# Patient Record
Sex: Male | Born: 1950
Health system: Southern US, Community
[De-identification: ages and names within clinical notes are randomized; demographics above are authoritative.]

## PROBLEM LIST (undated history)

## (undated) DIAGNOSIS — I509 Heart failure, unspecified: Secondary | ICD-10-CM

## (undated) DIAGNOSIS — I10 Essential (primary) hypertension: Secondary | ICD-10-CM

## (undated) DIAGNOSIS — Z973 Presence of spectacles and contact lenses: Secondary | ICD-10-CM

## (undated) DIAGNOSIS — J449 Chronic obstructive pulmonary disease, unspecified: Secondary | ICD-10-CM

## (undated) DIAGNOSIS — K219 Gastro-esophageal reflux disease without esophagitis: Secondary | ICD-10-CM

## (undated) DIAGNOSIS — C801 Malignant (primary) neoplasm, unspecified: Secondary | ICD-10-CM

## (undated) DIAGNOSIS — G4730 Sleep apnea, unspecified: Secondary | ICD-10-CM

## (undated) DIAGNOSIS — E079 Disorder of thyroid, unspecified: Secondary | ICD-10-CM

## (undated) DIAGNOSIS — F419 Anxiety disorder, unspecified: Secondary | ICD-10-CM

## (undated) DIAGNOSIS — M1990 Unspecified osteoarthritis, unspecified site: Secondary | ICD-10-CM

## (undated) DIAGNOSIS — I350 Nonrheumatic aortic (valve) stenosis: Secondary | ICD-10-CM

## (undated) DIAGNOSIS — Z923 Personal history of irradiation: Secondary | ICD-10-CM

## (undated) DIAGNOSIS — R011 Cardiac murmur, unspecified: Secondary | ICD-10-CM

## (undated) HISTORY — PX: HERNIA REPAIR: SHX51

## (undated) HISTORY — DX: Disorder of thyroid, unspecified: E07.9

## (undated) HISTORY — DX: Gastro-esophageal reflux disease without esophagitis: K21.9

## (undated) HISTORY — DX: Heart failure, unspecified: I50.9

## (undated) HISTORY — DX: Malignant (primary) neoplasm, unspecified: C80.1

## (undated) HISTORY — DX: Chronic obstructive pulmonary disease, unspecified: J44.9

## (undated) HISTORY — DX: Cardiac murmur, unspecified: R01.1

## (undated) HISTORY — DX: Nonrheumatic aortic (valve) stenosis: I35.0

## (undated) HISTORY — DX: Essential (primary) hypertension: I10

## (undated) HISTORY — DX: Anxiety disorder, unspecified: F41.9

---

## 2004-01-05 ENCOUNTER — Emergency Department (HOSPITAL_COMMUNITY): Admission: EM | Admit: 2004-01-05 | Discharge: 2004-01-05 | Payer: Self-pay | Admitting: Emergency Medicine

## 2005-08-20 ENCOUNTER — Ambulatory Visit (HOSPITAL_COMMUNITY): Admission: RE | Admit: 2005-08-20 | Discharge: 2005-08-20 | Payer: Self-pay | Admitting: Family Medicine

## 2005-10-23 ENCOUNTER — Ambulatory Visit (HOSPITAL_COMMUNITY): Admission: RE | Admit: 2005-10-23 | Discharge: 2005-10-23 | Payer: Self-pay | Admitting: Family Medicine

## 2006-02-24 HISTORY — PX: OTHER SURGICAL HISTORY: SHX169

## 2006-04-23 ENCOUNTER — Ambulatory Visit: Payer: Self-pay | Admitting: Gastroenterology

## 2006-04-23 LAB — CONVERTED CEMR LAB
Basophils Absolute: 0 10*3/uL (ref 0.0–0.1)
Basophils Relative: 0.8 % (ref 0.0–1.0)
Eosinophils Absolute: 0.4 10*3/uL (ref 0.0–0.6)
Eosinophils Relative: 6 % — ABNORMAL HIGH (ref 0.0–5.0)
Ferritin: 92.4 ng/mL (ref 22.0–322.0)
HCT: 42.9 % (ref 39.0–52.0)
Hemoglobin: 14.6 g/dL (ref 13.0–17.0)
Iron: 93 ug/dL (ref 42–165)
Lymphocytes Relative: 28.8 % (ref 12.0–46.0)
MCHC: 34 g/dL (ref 30.0–36.0)
MCV: 93.5 fL (ref 78.0–100.0)
Monocytes Absolute: 0.5 10*3/uL (ref 0.2–0.7)
Monocytes Relative: 7.9 % (ref 3.0–11.0)
Neutro Abs: 3.5 10*3/uL (ref 1.4–7.7)
Neutrophils Relative %: 56.5 % (ref 43.0–77.0)
Platelets: 242 10*3/uL (ref 150–400)
RBC: 4.58 M/uL (ref 4.22–5.81)
RDW: 11.8 % (ref 11.5–14.6)
Saturation Ratios: 31.3 % (ref 20.0–50.0)
Transferrin: 211.9 mg/dL — ABNORMAL LOW (ref >=212.0)
WBC: 6.2 10*3/uL (ref 4.5–10.5)

## 2006-08-10 ENCOUNTER — Ambulatory Visit: Payer: Self-pay | Admitting: Gastroenterology

## 2006-08-24 ENCOUNTER — Encounter: Payer: Self-pay | Admitting: Gastroenterology

## 2006-08-24 ENCOUNTER — Ambulatory Visit: Payer: Self-pay | Admitting: Gastroenterology

## 2010-07-12 NOTE — Assessment & Plan Note (Signed)
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   This past August Mr. Dais was having recurrent episodes of suprapubic  pain and was found by CT scan to have diverticulitis, and was treated  with appropriate antibiotics.  He had followup CT scan on October 23, 2005, which showed resolution of his previous diverticulitis, and he did  have extensive diverticulosis.  He has done well, except for the last 3  to 4 months, he has had a spasmodic-type pain in his rectum, made worse  by activity.  He has had some mild constipation and straining.  Has not  really seen a bright red blood per rectum.  He has had no fever, chills,  anorexia, weight loss, or any upper GI hepatobiliary problems.  He has  had no major change in his diet and has kept his weight up well.  This  pain does not awaken him from sleep.  He denies any specific food  intolerances.   He was recently seen in [facility] and had a  normal metabolic profile and liver function tests, and PSA.  I do not  see a CBC from that visit.   PAST MEDICAL HISTORY:  Remarkable for repair of an inguinal hernia many  years ago with removal of his left testicle.  He has otherwise had no  abdominal problems or other medical difficulties.   MEDICATIONS:  None.   ALLERGIES:  None.   FAMILY HISTORY:  Entirely noncontributory.   SOCIAL HISTORY:  He is married and lives with his wife, and has a  [employer].  The patient does smoke and uses ethanol socially.   REVIEW OF SYSTEMS:  Noncontributory.  Without any cardiovascular,  pulmonary, genitourinary, neurological, orthopedic, or endocrine  problems.   EXAM:  Shows him to be a healthy-appearing white male appearing his  stated age in no acute distress.  He is 5 feet 9 inches tall and weighs 193 pounds.  Blood pressure is  110/74, and pulse was 60 and regular.  I could not appreciate stigmata of chronic liver disease.  CHEST:  Entirely clear.  He appeared to be in a regular rhythm without murmurs, gallops, or rubs.  I could not appreciate hepatosplenomegaly, abdominal masses, significant  tenderness, or any evidence of inguinal hernias.  Bowel sounds were  normal.  Upper extremities were unremarkable.  Mental status was clear.  Inspection of the rectum showed no fissures, fistulae, or obvious  hemorrhoids.  Rectal exam showed no masses or tenderness.  There was  formed stool present that was trace guaiac positive.   Mr. Gamero symptoms are somewhat atypical and I am slightly concerned  about his trace positive guaiac stool, which may be traumatic.  It  certainly sounds like he is having rectal spasm, etiology unclear,  perhaps proctosigmoiditis.  As mentioned above, he has had extensive  diverticulosis with a history of diverticulitis.   RECOMMENDATIONS:  1. Continue high fiber diet with liberal p.o. fluids as tolerated.  2. Check CBC and iron studies.  3. Outpatient colonoscopy exam.  4. Trial of Canasa 1 g suppositories at bedtime.  5. Other medications as per Dr. Christell Constant.     Vania Rea. Jarold Motto, MD, Caleen Essex, FAGA  Electronically Signed    DRP/MedQ  DD: 04/23/2006  DT: 04/23/2006  Job #: [id number]   cc:   Ernestina Penna, M.D.

## 2011-06-17 ENCOUNTER — Encounter: Payer: Self-pay | Admitting: Gastroenterology

## 2013-01-11 ENCOUNTER — Ambulatory Visit (INDEPENDENT_AMBULATORY_CARE_PROVIDER_SITE_OTHER): Payer: 59 | Admitting: Family Medicine

## 2013-01-11 ENCOUNTER — Encounter (INDEPENDENT_AMBULATORY_CARE_PROVIDER_SITE_OTHER): Payer: Self-pay

## 2013-01-11 ENCOUNTER — Encounter: Payer: Self-pay | Admitting: Family Medicine

## 2013-01-11 VITALS — BP 148/96 | HR 70 | Temp 97.2°F | Ht 67.5 in | Wt 197.0 lb

## 2013-01-11 DIAGNOSIS — H60392 Other infective otitis externa, left ear: Secondary | ICD-10-CM

## 2013-01-11 DIAGNOSIS — H6120 Impacted cerumen, unspecified ear: Secondary | ICD-10-CM

## 2013-01-11 DIAGNOSIS — H60399 Other infective otitis externa, unspecified ear: Secondary | ICD-10-CM

## 2013-01-11 DIAGNOSIS — H6122 Impacted cerumen, left ear: Secondary | ICD-10-CM

## 2013-01-11 MED ORDER — AMOXICILLIN 875 MG PO TABS: 875.0000 mg | ORAL_TABLET | Freq: Two times a day (BID) | ORAL | Status: DC

## 2013-01-11 MED ORDER — NEOMYCIN-COLIST-HC-THONZONIUM 3.3-3-10-0.5 MG/ML OT SUSP: 4.0000 [drp] | Freq: Four times a day (QID) | OTIC | Status: DC

## 2013-01-11 NOTE — Progress Notes (Signed)
  Subjective:    Patient ID: Terry Pacheco, male    DOB: [birth year], 62 y.o.   MRN: [id number]  HPI Pt here with complaints of left ear pain. Pt states that he "always gets stopped up" and has a lot of pressure. Pt states he attempted to clean his ear out, but it seems to have made the pressure worse.   Review of Systems  Constitutional: Negative.   HENT: Positive for ear pain.   Respiratory: Negative.   Gastrointestinal: Negative.   Musculoskeletal: Negative.   All other systems reviewed and are negative.       Objective:   Physical Exam  Constitutional: He is oriented to person, place, and time. He appears well-developed and well-nourished.  HENT:  Right Ear: External ear normal.  Mouth/Throat: Oropharynx is clear and moist.  Left ear packed with cerumen. Unable to visualize TM   Eyes: Pupils are equal, round, and reactive to light.  Pulmonary/Chest: Breath sounds normal. No respiratory distress. He has no wheezes.  Neurological: He is alert and oriented to person, place, and time.  Skin: Skin is warm and dry.  Psychiatric: He has a normal mood and affect. His behavior is normal. Judgment and thought content normal.     BP 148/96  Pulse 70  Temp(Src) 97.2 F (36.2 C) (Oral)  Ht 5' 7.5" (1.715 m)  Wt 197 lb (89.359 kg)  BMI 30.38 kg/m2      Assessment & Plan:  Cerumen impaction, left - Plan: amoxicillin (AMOXIL) 875 MG tablet, neomycin-colistin-hydrocortisone-thonzonium (CORTISPORIN-TC) 3.04-26-08-0.5 MG/ML otic suspension  Otitis, externa, infective, left - Plan: amoxicillin (AMOXIL) 875 MG tablet, neomycin-colistin-hydrocortisone-thonzonium (CORTISPORIN-TC) 3.04-26-08-0.5 MG/ML otic suspension  Deatra Canter FNP

## 2013-01-11 NOTE — Patient Instructions (Signed)
Cerumen Plug A cerumen plug is having too much wax in your ear canal. The outer ear canal is lined with hairs and glands that secrete wax. This wax is called cerumen. This protects the ear canal. It also helps prevent material from entering the ear. Too much wax can cause a feeling of fullness in the ears, decreased hearing, ringing in the ears, or an earache. Sometimes your caregiver will remove a cerumen plug with an instrument called a curette. Or he/she may flush the ear canal with warm water from a syringe to remove the wax. You may simply be sent home to follow the home care instructions below for wax removal. Generally ear wax does not have to be removed unless it is causing a problem such as one of those listed above. When too much wax is causing a problem, the following are a few home remedies which can be used to help this problem. HOME CARE INSTRUCTIONS   Put a couple drops of glycerin, baby oil, or mineral oil in the ear a couple times of day. Do this every day for several days. After putting the drops in, you will need to lay with the affected ear pointing up for a couple minutes. This allows the drops to remain in the canal and run down to the area of wax blockage. This will soften the wax plug. It may also make your hearing worse as the wax softens and blocks the canal even more.  After a couple days, you may gently flush the ear canal with warm water from a syringe. Do this by pulling your ear up and back with your head tilted slightly forward and towards a pan to catch the water. This is most easily done with a helper. You can also accomplish the same thing by letting the shower beat into your ear canal to wash the wax out. Sometimes this will not be immediately successful. You will have to return to the first step of using the oil to further soften the wax. Then resume washing the ear canal out with a syringe or shower.  Following removal of the wax, put ten to twenty drops of rubbing  alcohol into the outer ears. This will dry the canal and prevent an infection.  Do not irrigate or wash out your ears if you have had a perforated ear drum or mastoid surgery. SEEK IMMEDIATE MEDICAL CARE IF:   You are unsuccessful with the above instructions for home care.  You develop ear pain or drainage from the ear. MAKE SURE YOU:   Understand these instructions.  Will watch your condition.  Will get help right away if you are not doing well or get worse. Document Released: 11/05/2000 Document Revised: 05/05/2011 Document Reviewed: 02/02/2008 Exit[employer] Patient Information 2014 [employer].  

## 2013-01-24 ENCOUNTER — Ambulatory Visit (INDEPENDENT_AMBULATORY_CARE_PROVIDER_SITE_OTHER): Payer: 59 | Admitting: Nurse Practitioner

## 2013-01-24 VITALS — BP 144/94 | HR 65 | Temp 97.6°F | Ht 69.0 in | Wt 202.0 lb

## 2013-01-24 DIAGNOSIS — H6592 Unspecified nonsuppurative otitis media, left ear: Secondary | ICD-10-CM

## 2013-01-24 DIAGNOSIS — M26629 Arthralgia of temporomandibular joint, unspecified side: Secondary | ICD-10-CM

## 2013-01-24 DIAGNOSIS — H6590 Unspecified nonsuppurative otitis media, unspecified ear: Secondary | ICD-10-CM

## 2013-01-24 MED ORDER — CHLORPHEN-PE-ACETAMINOPHEN 4-10-325 MG PO TABS: 1.0000 | ORAL_TABLET | Freq: Four times a day (QID) | ORAL | Status: DC | PRN

## 2013-01-24 MED ORDER — FLUTICASONE PROPIONATE 50 MCG/ACT NA SUSP: 2.0000 | Freq: Every day | NASAL | Status: DC

## 2013-01-24 NOTE — Progress Notes (Signed)
   Subjective:    Patient ID: Terry Pacheco, male    DOB: September 13, 1950, 62 y.o.   MRN: [id number]  HPI  Patient in today c/o left ear pain- was seen then and he had cerumen impaction and otitis media- impaction was irrigated out and he was given amoxicillin 875 bid and ear drops. Still has lots of pressure in ear and it is driving him crazy!!!    Review of Systems  Constitutional: Negative for fever and chills.  HENT: Positive for ear pain. Negative for congestion, ear discharge and facial swelling.   Respiratory: Negative for cough.   Cardiovascular: Negative.   All other systems reviewed and are negative.       Objective:   Physical Exam  Constitutional: He is oriented to person, place, and time. He appears well-developed and well-nourished.  HENT:  Right Ear: Hearing, tympanic membrane, external ear and ear canal normal.  Left Ear: Hearing normal. A middle ear effusion (clear) is present.  Nose: Nose normal. No mucosal edema or rhinorrhea.  Mouth/Throat: Oropharynx is clear and moist and mucous membranes are normal.  Left TMJ pops with FROM  Eyes: Conjunctivae and EOM are normal. Pupils are equal, round, and reactive to light.  Neck: Normal range of motion.  Pulmonary/Chest: Effort normal and breath sounds normal.  Abdominal: Soft. Bowel sounds are normal.  Lymphadenopathy:    He has no cervical adenopathy.  Neurological: He is alert and oriented to person, place, and time.    BP 144/94  Pulse 65  Temp(Src) 97.6 F (36.4 C) (Oral)  Ht 5\' 9"  (1.753 m)  Wt 202 lb (91.627 kg)  BMI 29.82 kg/m2       Assessment & Plan:   1. Middle ear effusion, left   2. TMJ arthralgia    Meds ordered this encounter  Medications  . fluticasone (FLONASE) 50 MCG/ACT nasal spray    Sig: Place 2 sprays into both nostrils daily.    Dispense:  16 g    Refill:  6    Order Specific Question:  Supervising Provider    Answer:  Ernestina Penna [[id number]]  . Chlorphen-PE-Acetaminophen (NOREL AD)  4-10-325 MG TABS    Sig: Take 1 tablet by mouth every 6 (six) hours as needed.    Dispense:  10 tablet    Refill:  0    [phone]    Order Specific Question:  Supervising Provider    Answer:  Ernestina Penna [[id number]]  1/2 norel ad due to blood pressure Force fluids  Motrin OTC as needed Mouth guard to use when sleeping RTO prn Blood pressure elevated- low NA diet- keep diary of Bp- Make appointment for physical Mary-Margaret Daphine Deutscher, FNP

## 2013-01-24 NOTE — Patient Instructions (Signed)
Temporomandibular Problems  Temporomandibular joint (TMJ) dysfunction means there are problems with the joint between your jaw and your skull. This is a joint lined by cartilage like other joints in your body but also has a small disc in the joint which keeps the bones from rubbing on each other. These joints are like other joints and can get inflamed (sore) from arthritis and other problems. When this joint gets sore, it can cause headaches and pain in the jaw and the face. CAUSES  Usually the arthritic types of problems are caused by soreness in the joint. Soreness in the joint can also be caused by overuse. This may come from grinding your teeth. It may also come from mis-alignment in the joint. DIAGNOSIS Diagnosis of this condition can often be made by history and exam. Sometimes your caregiver may need X-rays or an MRI scan to determine the exact cause. It may be necessary to see your dentist to determine if your teeth and jaws are lined up correctly. TREATMENT  Most of the time this problem is not serious; however, sometimes it can persist (become chronic). When this happens medications that will cut down on inflammation (soreness) help. Sometimes a shot of cortisone into the joint will be helpful. If your teeth are not aligned it may help for your dentist to make a splint for your mouth that can help this problem. If no physical problems can be found, the problem may come from tension. If tension is found to be the cause, biofeedback or relaxation techniques may be helpful. HOME CARE INSTRUCTIONS   Later in the day, applications of ice packs may be helpful. Ice can be used in a plastic bag with a towel around it to prevent frostbite to skin. This may be used about every 2 hours for 20 to 30 minutes, as needed while awake, or as directed by your caregiver.  Only take over-the-counter or prescription medicines for pain, discomfort, or fever as directed by your caregiver.  If physical therapy was  prescribed, follow your caregiver's directions.  Wear mouth appliances as directed if they were given. Document Released: 11/05/2000 Document Revised: 05/05/2011 Document Reviewed: 02/13/2008 [employer] Patient Information 2014 [employer].  

## 2013-02-08 ENCOUNTER — Encounter: Payer: Self-pay | Admitting: Family Medicine

## 2013-02-08 ENCOUNTER — Ambulatory Visit (INDEPENDENT_AMBULATORY_CARE_PROVIDER_SITE_OTHER): Payer: 59 | Admitting: Family Medicine

## 2013-02-08 VITALS — BP 107/69 | HR 66 | Temp 99.0°F | Ht 69.0 in | Wt 195.0 lb

## 2013-02-08 DIAGNOSIS — H8110 Benign paroxysmal vertigo, unspecified ear: Secondary | ICD-10-CM

## 2013-02-08 DIAGNOSIS — I10 Essential (primary) hypertension: Secondary | ICD-10-CM

## 2013-02-08 DIAGNOSIS — R42 Dizziness and giddiness: Secondary | ICD-10-CM

## 2013-02-08 DIAGNOSIS — R51 Headache: Secondary | ICD-10-CM

## 2013-02-08 LAB — POCT GLYCOSYLATED HEMOGLOBIN (HGB A1C): Hemoglobin A1C: 5.2

## 2013-02-08 MED ORDER — MECLIZINE HCL 25 MG PO TABS: 25.0000 mg | ORAL_TABLET | Freq: Three times a day (TID) | ORAL | Status: DC | PRN

## 2013-02-08 NOTE — Progress Notes (Signed)
   Subjective:    Patient ID: Terry Pacheco, male    DOB: [birth year], 62 y.o.   MRN: [id number]  HPI Patient presents today with chief complaint of posterior headache and dizziness. Patient was seen multiple times over the past 2-3 weeks with complaints of dizziness as well as ear pain. Patient was initially placed on a course of amoxicillin and Cortisporin for otitis media and otitis externa coverage. Symptoms recurred one to 2 weeks later. Was diagnosed with middle ear effusion as well as TMJ arthralgia. Patient followed up with ENT where, per patient, he is formally diagnosed with hypertension. Patient started on lisinopril 20 mg daily. Patient states that ear pain has improved somewhat however still had persistent posterior headache and dizziness. Dizziness seems to be postural in nature but having some vertiginous albeit mild symptoms. No hemiparesis or confusion. No ear ringing or loss of hearing.  Patient has also had intermittent severe headache being grade 8-9/10 at its worse. Headache has not woken him up from sleep. No known trauma. Headache started about 2 months ago, otherwise no prior history of headache in the past. Headache is probably posterior nature with some radiation to the front. Has also mild physician changed, though feels like he needs new glasses. No CP, SOB. Prior smoker.  No known coronary disease or other metabolic risk factors to date. Needs workup.  + hx/o massive stroke in father.      Review of Systems  All other systems reviewed and are negative.       Objective:   Physical Exam  Constitutional: He appears well-developed and well-nourished.  HENT:  Head: Normocephalic and atraumatic.  Right Ear: External ear normal.  Left Ear: External ear normal.  Eyes: Conjunctivae are normal. Pupils are equal, round, and reactive to light.  Neck: Normal range of motion. Neck supple.  Cardiovascular: Normal rate and regular rhythm.   Pulmonary/Chest:  Effort normal and breath sounds normal.  Abdominal: Soft.  Musculoskeletal: Normal range of motion.  Neurological: He is alert. No cranial nerve deficit.  dix hallpike faintly positive   Skin: Skin is warm.          Assessment & Plan:  HTN (hypertension) - Plan: POCT CBC, POCT glycosylated hemoglobin (Hb A1C), TSH, NMR, lipoprofile, CANCELED: Comprehensive metabolic panel, CANCELED: Comprehensive metabolic panel  Dizziness - Plan: MR Brain W Wo Contrast  Headache(784.0) - Plan: MR Brain W Wo Contrast  Benign paroxysmal positional vertigo - Plan: meclizine (ANTIVERT) 25 MG tablet  Differential diagnoses for symptoms is fairly broad including neurovascular sources of symptoms. Given a headache and dizziness are fairly new complaints for this patient over the past 2 months, we'll obtain MRI of the brain with and without contrast rule out any type of malignant or neurovascular source of symptoms. We'll start patient on meclizine for symptoms as patient was mildly Dix-Hallpike positive on exam today. Patient does have some cardiovascular risk factors including hypertension and as well as being a former smoker. Will check thyroid function, A1c, lipid profile. Follow up pending bloodwork and imaging.

## 2013-02-09 LAB — POCT CBC
Granulocyte percent: 75.8 %G (ref 37–80)
HCT, POC: 46.2 % (ref 43.5–53.7)
Hemoglobin: 15 g/dL (ref 14.1–18.1)
Lymph, poc: 2 (ref 0.6–3.4)
MCH, POC: 30.6 pg (ref 27–31.2)
MCHC: 32.5 g/dL (ref 31.8–35.4)
MCV: 94.2 fL (ref 80–97)
MPV: 8 fL (ref 0–99.8)
POC Granulocyte: 6.6 (ref 2–6.9)
POC LYMPH PERCENT: 23.1 %L (ref 10–50)
Platelet Count, POC: 244 10*3/uL (ref 142–424)
RBC: 4.9 M/uL (ref 4.69–6.13)
RDW, POC: 12.3 %
WBC: 8.7 10*3/uL (ref 4.6–10.2)

## 2013-02-10 ENCOUNTER — Ambulatory Visit (HOSPITAL_COMMUNITY): Payer: 59

## 2013-02-10 ENCOUNTER — Telehealth: Payer: Self-pay | Admitting: Family Medicine

## 2013-02-10 DIAGNOSIS — E785 Hyperlipidemia, unspecified: Secondary | ICD-10-CM

## 2013-02-10 LAB — NMR, LIPOPROFILE
Cholesterol: 198 mg/dL (ref <200)
HDL Cholesterol by NMR: 30 mg/dL — ABNORMAL LOW (ref >=40)
HDL Particle Number: 24.7 umol/L — ABNORMAL LOW (ref >=30.5)
LDL Particle Number: 2198 nmol/L — ABNORMAL HIGH (ref <1000)
LDL Size: 20 nm — ABNORMAL LOW (ref >20.5)
LDLC SERPL CALC-MCNC: 128 mg/dL — ABNORMAL HIGH (ref <100)
LP-IR Score: 76 — ABNORMAL HIGH (ref <=45)
Small LDL Particle Number: 1484 nmol/L — ABNORMAL HIGH (ref <=527)
Triglycerides by NMR: 201 mg/dL — ABNORMAL HIGH (ref <150)

## 2013-02-10 LAB — TSH: TSH: 0.985 u[IU]/mL (ref 0.450–4.500)

## 2013-02-10 MED ORDER — ROSUVASTATIN CALCIUM 20 MG PO TABS
20.0000 mg | ORAL_TABLET | Freq: Every day | ORAL | Status: DC
Start: 2013-02-10 — End: 2014-07-06

## 2013-02-10 NOTE — ED Notes (Signed)
Called crestor 20mg  daily   Doree Albee, MD 02/10/13 [phone]

## 2013-02-22 ENCOUNTER — Telehealth: Payer: Self-pay | Admitting: Family Medicine

## 2013-03-01 ENCOUNTER — Telehealth: Payer: Self-pay | Admitting: Family Medicine

## 2013-03-02 NOTE — Telephone Encounter (Signed)
Patient aware.

## 2013-03-14 ENCOUNTER — Encounter: Payer: Self-pay | Admitting: Nurse Practitioner

## 2013-03-14 ENCOUNTER — Ambulatory Visit (INDEPENDENT_AMBULATORY_CARE_PROVIDER_SITE_OTHER): Payer: 59 | Admitting: Nurse Practitioner

## 2013-03-14 VITALS — BP 115/75 | HR 66 | Temp 98.1°F | Ht 69.0 in | Wt 194.0 lb

## 2013-03-14 DIAGNOSIS — Z0000 Encounter for general adult medical examination without abnormal findings: Secondary | ICD-10-CM

## 2013-03-14 DIAGNOSIS — E785 Hyperlipidemia, unspecified: Secondary | ICD-10-CM

## 2013-03-14 DIAGNOSIS — R42 Dizziness and giddiness: Secondary | ICD-10-CM

## 2013-03-14 DIAGNOSIS — I10 Essential (primary) hypertension: Secondary | ICD-10-CM

## 2013-03-14 NOTE — Progress Notes (Signed)
Subjective:    Patient ID: Terry Pacheco, male    DOB: 27-Jul-1950, 63 y.o.   MRN: [id number]  Patient here today for follow-up. Has started on lisinoril for blood pressure since last visit- no c/o side effects  Hypertension This is a chronic problem. The current episode started more than 1 month ago. The problem has been resolved since onset. The problem is controlled. Pertinent negatives include no blurred vision, chest pain, headaches, neck pain, palpitations, peripheral edema, shortness of breath or sweats. There are no associated agents to hypertension. Risk factors for coronary artery disease include dyslipidemia, family history and male gender. Past treatments include ACE inhibitors. The current treatment provides significant improvement. Compliance problems include diet and exercise.   Hyperlipidemia This is a chronic problem. The current episode started more than 1 year ago. The problem is uncontrolled. Recent lipid tests were reviewed and are high. He has no history of diabetes, hypothyroidism or obesity. Pertinent negatives include no chest pain or shortness of breath. Current antihyperlipidemic treatment includes statins. The current treatment provides moderate improvement of lipids. Compliance problems include adherence to diet and adherence to exercise.  Risk factors for coronary artery disease include dyslipidemia, family history, hypertension and male sex.      Review of Systems  Eyes: Negative for blurred vision.  Respiratory: Negative for shortness of breath.   Cardiovascular: Negative for chest pain and palpitations.  Musculoskeletal: Negative for neck pain.  Neurological: Negative for headaches.  All other systems reviewed and are negative.       Objective:   Physical Exam  Constitutional: He is oriented to person, place, and time. He appears well-developed and well-nourished.  HENT:  Head: Normocephalic.  Right Ear: External ear normal.  Left Ear: External ear  normal.  Nose: Nose normal.  Mouth/Throat: Oropharynx is clear and moist.  Eyes: EOM are normal. Pupils are equal, round, and reactive to light.  Neck: Normal range of motion. Neck supple. No JVD present. No thyromegaly present.  Cardiovascular: Normal rate, regular rhythm, normal heart sounds and intact distal pulses.  Exam reveals no gallop and no friction rub.   No murmur heard. Pulmonary/Chest: Effort normal and breath sounds normal. No respiratory distress. He has no wheezes. He has no rales. He exhibits no tenderness.  Abdominal: Soft. Bowel sounds are normal. He exhibits no mass. There is no tenderness.  Musculoskeletal: Normal range of motion. He exhibits no edema.  Lymphadenopathy:    He has no cervical adenopathy.  Neurological: He is alert and oriented to person, place, and time. No cranial nerve deficit.  Skin: Skin is warm and dry.  Psychiatric: He has a normal mood and affect. His behavior is normal. Judgment and thought content normal.   BP 115/75  Pulse 66  Temp(Src) 98.1 F (36.7 C) (Oral)  Ht _0  (1.753 m)  Wt 194 lb (87.998 kg)  BMI 28.64 kg/m2        Assessment & Plan:   1. Hypertension   2. Hyperlipidemia LDL goal < 100   3. Vertigo    Orders Placed This Encounter  Procedures  . MR Brain W Wo Contrast    Standing Status: Future     Number of Occurrences:      Standing Expiration Date: 05/13/2014    Order Specific Question:  Reason for Exam (SYMPTOM  OR DIAGNOSIS REQUIRED)    Answer:  recurrent dizziness    Order Specific Question:  Preferred imaging location?    Answer:  [facility]    Order Specific Question:  Does the patient have a pacemaker, internal devices, implants, aneury    Answer:  No  . CMP14+EGFR  . NMR, lipoprofile    Hemoccult cards given Labs pending Health maintenance reviewed Diet and exercise encouraged Continue all meds Follow up  In 3 mmonths   Mary-Margaret Hassell Done, FNP

## 2013-03-14 NOTE — Patient Instructions (Signed)

## 2013-03-15 LAB — NMR, LIPOPROFILE
Cholesterol: 124 mg/dL (ref <200)
HDL Cholesterol by NMR: 34 mg/dL — ABNORMAL LOW (ref >=40)
HDL Particle Number: 28.9 umol/L — ABNORMAL LOW (ref >=30.5)
LDL Particle Number: 942 nmol/L (ref <1000)
LDL Size: 20.1 nm — ABNORMAL LOW (ref >20.5)
LDLC SERPL CALC-MCNC: 55 mg/dL (ref <100)
LP-IR Score: 66 — ABNORMAL HIGH (ref <=45)
Small LDL Particle Number: 607 nmol/L — ABNORMAL HIGH (ref <=527)
Triglycerides by NMR: 177 mg/dL — ABNORMAL HIGH (ref <150)

## 2013-03-15 LAB — CMP14+EGFR
ALT: 25 IU/L (ref 0–44)
AST: 25 IU/L (ref 0–40)
Albumin/Globulin Ratio: 1.8 (ref 1.1–2.5)
Albumin: 4.7 g/dL (ref 3.6–4.8)
Alkaline Phosphatase: 128 IU/L — ABNORMAL HIGH (ref 39–117)
BUN/Creatinine Ratio: 9 — ABNORMAL LOW (ref 10–22)
BUN: 10 mg/dL (ref 8–27)
CO2: 21 mmol/L (ref 18–29)
Calcium: 9.6 mg/dL (ref 8.6–10.2)
Chloride: 101 mmol/L (ref 97–108)
Creatinine, Ser: 1.11 mg/dL (ref 0.76–1.27)
GFR calc Af Amer: 82 mL/min/{1.73_m2} (ref >59)
GFR calc non Af Amer: 71 mL/min/{1.73_m2} (ref >59)
Globulin, Total: 2.6 g/dL (ref 1.5–4.5)
Glucose: 98 mg/dL (ref 65–99)
Potassium: 4.8 mmol/L (ref 3.5–5.2)
Sodium: 139 mmol/L (ref 134–144)
Total Bilirubin: 0.5 mg/dL (ref 0.0–1.2)
Total Protein: 7.3 g/dL (ref 6.0–8.5)

## 2013-03-15 LAB — SPECIMEN STATUS REPORT

## 2013-04-14 NOTE — Telephone Encounter (Signed)
Has been seen since

## 2013-07-13 ENCOUNTER — Ambulatory Visit: Payer: 59 | Admitting: Nurse Practitioner

## 2013-08-22 ENCOUNTER — Encounter: Payer: Self-pay | Admitting: Family Medicine

## 2013-08-22 ENCOUNTER — Ambulatory Visit (INDEPENDENT_AMBULATORY_CARE_PROVIDER_SITE_OTHER): Payer: 59 | Admitting: Family Medicine

## 2013-08-22 VITALS — BP 147/86 | HR 71 | Temp 97.8°F | Ht 69.0 in | Wt 200.2 lb

## 2013-08-22 DIAGNOSIS — M545 Low back pain, unspecified: Secondary | ICD-10-CM

## 2013-08-22 DIAGNOSIS — N390 Urinary tract infection, site not specified: Secondary | ICD-10-CM

## 2013-08-22 DIAGNOSIS — R350 Frequency of micturition: Secondary | ICD-10-CM

## 2013-08-22 DIAGNOSIS — K219 Gastro-esophageal reflux disease without esophagitis: Secondary | ICD-10-CM

## 2013-08-22 LAB — POCT URINALYSIS DIPSTICK
Bilirubin, UA: NEGATIVE
Blood, UA: NEGATIVE
Glucose, UA: NEGATIVE
Ketones, UA: NEGATIVE
Nitrite, UA: NEGATIVE
Protein, UA: NEGATIVE
Spec Grav, UA: 1.025
Urobilinogen, UA: NEGATIVE
pH, UA: 5

## 2013-08-22 LAB — POCT UA - MICROSCOPIC ONLY
Bacteria, U Microscopic: NEGATIVE
Casts, Ur, LPF, POC: NEGATIVE
Crystals, Ur, HPF, POC: NEGATIVE
Mucus, UA: NEGATIVE
Yeast, UA: NEGATIVE

## 2013-08-22 MED ORDER — HYDROCODONE-ACETAMINOPHEN 5-325 MG PO TABS: 1.0000 | ORAL_TABLET | Freq: Four times a day (QID) | ORAL | Status: DC | PRN

## 2013-08-22 MED ORDER — OMEPRAZOLE 20 MG PO CPDR: 20.0000 mg | DELAYED_RELEASE_CAPSULE | Freq: Every day | ORAL | Status: DC

## 2013-08-22 MED ORDER — CIPROFLOXACIN HCL 500 MG PO TABS: 500.0000 mg | ORAL_TABLET | Freq: Two times a day (BID) | ORAL | Status: DC

## 2013-08-22 NOTE — Progress Notes (Signed)
   Subjective:    Patient ID: Terry Pacheco, male    DOB: June 13, 1950, 63 y.o.   MRN: [id number]  HPI This 63 y.o. male presents for evaluation of left inguinal discomfort radiating into his left  Testicle.  Testicle is not tender at present.  He c/o dysuria and urinary frequency.  He has hx  of diverticulosis and gets bouts of diverticulitis.  He states he is having some back pain on the left side and he has been having GERD sx's.  He feels washed out.   Review of Systems C/o back pain, urinary freq, dysuria, abdominal pain, left testicular discomfort. No chest pain, SOB, HA, dizziness, vision change, N/V, diarrhea, constipation or rash.     Objective:   Physical Exam Vital signs noted  Well developed well nourished male.  HEENT - Head atraumatic Normocephalic                Eyes - PERRLA, Conjuctiva - clear Sclera- Clear EOMI                Ears - EAC's Wnl TM's Wnl Gross Hearing WNL                Nose - Nares patent                 Throat - oropharanx wnl Respiratory - Lungs CTA bilateral Cardiac - RRR S1 and S2 without murmur GI - Abdomen soft Nontender and bowel sounds active x 4 Extremities - No edema. Neuro - Grossly intact.  Results for orders placed in visit on 08/22/13  POCT URINALYSIS DIPSTICK      Result Value Ref Range   Color, UA yellow     Clarity, UA clear     Glucose, UA neg     Bilirubin, UA neg     Ketones, UA neg     Spec Grav, UA 1.025     Blood, UA neg     pH, UA 5.0     Protein, UA neg     Urobilinogen, UA negative     Nitrite, UA neg     Leukocytes, UA Trace    POCT UA - MICROSCOPIC ONLY      Result Value Ref Range   WBC, Ur, HPF, POC 1-5     RBC, urine, microscopic rare     Bacteria, U Microscopic neg     Mucus, UA neg     Epithelial cells, urine per micros occ     Crystals, Ur, HPF, POC neg     Casts, Ur, LPF, POC neg     Yeast, UA neg         Assessment & Plan:  Frequent urination - Plan: POCT urinalysis dipstick, POCT UA -  Microscopic Only, HYDROcodone-acetaminophen (NORCO) 5-325 MG per tablet, ciprofloxacin (CIPRO) 500 MG tablet, Urine culture, omeprazole (PRILOSEC) 20 MG capsule  Left-sided low back pain without sciatica - Plan: HYDROcodone-acetaminophen (NORCO) 5-325 MG per tablet, ciprofloxacin (CIPRO) 500 MG tablet, Urine culture, omeprazole (PRILOSEC) 20 MG capsule  Urinary tract infection without hematuria, site unspecified - Plan: HYDROcodone-acetaminophen (NORCO) 5-325 MG per tablet, ciprofloxacin (CIPRO) 500 MG tablet, Urine culture, omeprazole (PRILOSEC) 20 MG capsule  Gastroesophageal reflux disease without esophagitis - Plan: HYDROcodone-acetaminophen (NORCO) 5-325 MG per tablet, ciprofloxacin (CIPRO) 500 MG tablet, Urine culture, omeprazole (PRILOSEC) 20 MG capsule  [name] FNP

## 2013-08-23 ENCOUNTER — Other Ambulatory Visit: Payer: Self-pay | Admitting: Family Medicine

## 2013-08-24 LAB — URINE CULTURE: Organism ID, Bacteria: NO GROWTH

## 2013-09-12 ENCOUNTER — Ambulatory Visit (INDEPENDENT_AMBULATORY_CARE_PROVIDER_SITE_OTHER): Payer: 59 | Admitting: Family Medicine

## 2013-09-12 VITALS — BP 110/75 | HR 69 | Temp 98.3°F | Ht 69.0 in | Wt 202.2 lb

## 2013-09-12 DIAGNOSIS — R059 Cough, unspecified: Secondary | ICD-10-CM

## 2013-09-12 DIAGNOSIS — H60399 Other infective otitis externa, unspecified ear: Secondary | ICD-10-CM

## 2013-09-12 DIAGNOSIS — R05 Cough: Secondary | ICD-10-CM

## 2013-09-12 DIAGNOSIS — H60392 Other infective otitis externa, left ear: Secondary | ICD-10-CM

## 2013-09-12 DIAGNOSIS — J069 Acute upper respiratory infection, unspecified: Secondary | ICD-10-CM

## 2013-09-12 DIAGNOSIS — R110 Nausea: Secondary | ICD-10-CM

## 2013-09-12 DIAGNOSIS — I10 Essential (primary) hypertension: Secondary | ICD-10-CM

## 2013-09-12 MED ORDER — BENZONATATE 100 MG PO CAPS: 100.0000 mg | ORAL_CAPSULE | Freq: Three times a day (TID) | ORAL | Status: DC | PRN

## 2013-09-12 MED ORDER — AMLODIPINE BESYLATE 5 MG PO TABS: 5.0000 mg | ORAL_TABLET | Freq: Every day | ORAL | Status: DC

## 2013-09-12 MED ORDER — METHYLPREDNISOLONE ACETATE 80 MG/ML IJ SUSP
80.0000 mg | Freq: Once | INTRAMUSCULAR | Status: AC
  Administered 2013-09-12: 80 mg via INTRAMUSCULAR

## 2013-09-12 MED ORDER — AMOXICILLIN 875 MG PO TABS: 875.0000 mg | ORAL_TABLET | Freq: Two times a day (BID) | ORAL | Status: DC

## 2013-09-12 MED ORDER — ONDANSETRON 8 MG PO TBDP: 8.0000 mg | ORAL_TABLET | Freq: Three times a day (TID) | ORAL | Status: DC | PRN

## 2013-09-12 NOTE — Progress Notes (Signed)
   Subjective:    Patient ID: Terry Pacheco, male    DOB: 1951/02/10, 63 y.o.   MRN: [id number]  HPI This 63 y.o. male presents for evaluation of URI sx's.  He has been having chronic left ear problems and states he went to see ENT and was told his bp was the problem so he was rx'd lisinopril 20mg  po qd.  He has been having persistent cough.  He is c/o cough, nausea, sore throat, left ear discomfort, And feeling congested.  He states he feels miserable    Review of Systems C/o cough, congestion, nausea, and uri sx's No chest pain, SOB, HA, dizziness, vision change,  diarrhea, constipation, dysuria, urinary urgency or frequency, myalgias, arthralgias or rash.     Objective:   Physical Exam Vital signs noted  Well developed well nourished male.  HEENT - Head atraumatic Normocephalic                Eyes - PERRLA, Conjuctiva - clear Sclera- Clear EOMI                Ears - EAC's Wnl TM's Wnl Gross Hearing WNL                Throat - oropharanx wnl Respiratory - Lungs CTA bilateral Cardiac - RRR S1 and S2 without murmur GI - Abdomen soft Nontender and bowel sounds active x 4 Extremities - No edema. Neuro - Grossly intact.       Assessment & Plan:  Otitis, externa, infective, left - Plan: amoxicillin (AMOXIL) 875 MG tablet  Cough - Plan: benzonatate (TESSALON PERLES) 100 MG capsule  URI (upper respiratory infection) - Plan: amoxicillin (AMOXIL) 875 MG tablet, methylPREDNISolone acetate (DEPO-MEDROL) injection 80 mg, benzonatate (TESSALON PERLES) 100 MG capsule  Essential hypertension - Plan: amLODipine (NORVASC) 5 MG tablet  Nausea alone - Plan: ondansetron (ZOFRAN ODT) 8 MG disintegrating tablet  Push po fluids, rest, tylenol and motrin otc prn as directed for fever, arthralgias, and myalgias.  Follow up prn if sx's continue or persist.  [name] FNP

## 2013-09-20 ENCOUNTER — Ambulatory Visit (INDEPENDENT_AMBULATORY_CARE_PROVIDER_SITE_OTHER): Payer: 59 | Admitting: Family Medicine

## 2013-09-20 VITALS — BP 129/81 | HR 70 | Temp 97.7°F | Wt 201.0 lb

## 2013-09-20 DIAGNOSIS — H65119 Acute and subacute allergic otitis media (mucoid) (sanguinous) (serous), unspecified ear: Secondary | ICD-10-CM

## 2013-09-20 DIAGNOSIS — H8113 Benign paroxysmal vertigo, bilateral: Secondary | ICD-10-CM

## 2013-09-20 DIAGNOSIS — H8110 Benign paroxysmal vertigo, unspecified ear: Secondary | ICD-10-CM

## 2013-09-20 DIAGNOSIS — H65112 Acute and subacute allergic otitis media (mucoid) (sanguinous) (serous), left ear: Secondary | ICD-10-CM

## 2013-09-20 MED ORDER — MECLIZINE HCL 32 MG PO TABS: 32.0000 mg | ORAL_TABLET | Freq: Three times a day (TID) | ORAL | Status: DC | PRN

## 2013-09-20 MED ORDER — METHYLPREDNISOLONE (PAK) 4 MG PO TABS: ORAL_TABLET | ORAL | Status: DC

## 2013-09-20 NOTE — Progress Notes (Signed)
   Subjective:    Patient ID: Terry Pacheco, male    DOB: June 17, 1950, 63 y.o.   MRN: [id number]  HPI  This 63 y.o. male presents for evaluation of left ear discomfort and decreased hearing acuity.  He has been having problems with the left ear for years.  He was seen last week and dx'd with LOM.  He has been having some sinus drainage, pressure, and dizziness.  He wants a referral to ENT because of chronic left ear problems.  Review of Systems    No chest pain, SOB, HA, dizziness, vision change, N/V, diarrhea, constipation, dysuria, urinary urgency or frequency, myalgias, arthralgias or rash.  Objective:   Physical Exam Vital signs noted  Well developed well nourished male.  HEENT - Head atraumatic Normocephalic                Eyes - PERRLA, Conjuctiva - clear Sclera- Clear EOMI                Ears - Left TM partially blocked with cerumen but top is dull and red.  Right TM wnl                           Decreased hearing acuity left ear.                Throat - oropharanx wnl Respiratory - Lungs CTA bilateral Cardiac - RRR S1 and S2 without murmur GI - Abdomen soft Nontender and bowel sounds active x 4      Assessment & Plan:  Acute allergic otitis media of left ear, recurrence not specified - Plan: Ambulatory referral to ENT, methylPREDNIsolone (MEDROL DOSPACK) 4 MG tablet, meclizine (ANTIVERT) 32 MG tablet  Benign paroxysmal positional vertigo, bilateral - Plan: Ambulatory referral to ENT, methylPREDNIsolone (MEDROL DOSPACK) 4 MG tablet, meclizine (ANTIVERT) 32 MG tablet  [name] FNP

## 2013-12-11 ENCOUNTER — Other Ambulatory Visit: Payer: Self-pay | Admitting: Family Medicine

## 2013-12-12 NOTE — Telephone Encounter (Signed)
Last seen 03/14/13  MMM

## 2014-01-12 ENCOUNTER — Other Ambulatory Visit: Payer: Self-pay | Admitting: Nurse Practitioner

## 2014-02-16 ENCOUNTER — Other Ambulatory Visit: Payer: Self-pay | Admitting: Family Medicine

## 2014-02-20 ENCOUNTER — Other Ambulatory Visit: Payer: Self-pay | Admitting: Family Medicine

## 2014-04-17 ENCOUNTER — Other Ambulatory Visit: Payer: Self-pay | Admitting: Nurse Practitioner

## 2014-05-15 ENCOUNTER — Other Ambulatory Visit: Payer: Self-pay | Admitting: Nurse Practitioner

## 2014-06-12 ENCOUNTER — Other Ambulatory Visit: Payer: Self-pay | Admitting: Nurse Practitioner

## 2014-06-16 ENCOUNTER — Telehealth: Payer: Self-pay | Admitting: Nurse Practitioner

## 2014-06-23 ENCOUNTER — Ambulatory Visit: Payer: Self-pay | Admitting: Family

## 2014-06-26 NOTE — Telephone Encounter (Signed)
Chart reviewed and patient has an upcoming appointment with Dr Livia Snellen.

## 2014-07-05 ENCOUNTER — Other Ambulatory Visit: Payer: Self-pay | Admitting: Family Medicine

## 2014-07-05 MED ORDER — OMEPRAZOLE 20 MG PO CPDR: DELAYED_RELEASE_CAPSULE | ORAL | Status: DC

## 2014-07-05 NOTE — Telephone Encounter (Signed)
Rx refilled and patient aware he must be seen for any further refills.

## 2014-07-06 ENCOUNTER — Ambulatory Visit (INDEPENDENT_AMBULATORY_CARE_PROVIDER_SITE_OTHER): Payer: 59 | Admitting: Family

## 2014-07-06 ENCOUNTER — Encounter: Payer: Self-pay | Admitting: Family

## 2014-07-06 VITALS — BP 135/90 | HR 68 | Temp 97.5°F | Ht 69.0 in | Wt 203.4 lb

## 2014-07-06 DIAGNOSIS — E785 Hyperlipidemia, unspecified: Secondary | ICD-10-CM

## 2014-07-06 DIAGNOSIS — I10 Essential (primary) hypertension: Secondary | ICD-10-CM

## 2014-07-06 DIAGNOSIS — K219 Gastro-esophageal reflux disease without esophagitis: Secondary | ICD-10-CM | POA: Diagnosis not present

## 2014-07-06 HISTORY — DX: Gastro-esophageal reflux disease without esophagitis: K21.9

## 2014-07-06 MED ORDER — OMEPRAZOLE 20 MG PO CPDR: DELAYED_RELEASE_CAPSULE | ORAL | Status: DC

## 2014-07-06 MED ORDER — AMLODIPINE BESYLATE 5 MG PO TABS: 5.0000 mg | ORAL_TABLET | Freq: Every day | ORAL | Status: DC

## 2014-07-06 NOTE — Addendum Note (Signed)
Addended by: Earlene Plater on: 07/06/2014 02:44 PM   Modules accepted: Orders

## 2014-07-06 NOTE — Patient Instructions (Signed)

## 2014-07-06 NOTE — Progress Notes (Signed)
Subjective:    Patient ID: Terry Pacheco, male    DOB: [birth year], 64 y.o.   MRN: [id number]  Hypertension This is a chronic problem. The current episode started more than 1 year ago. The problem has been waxing and waning since onset. The problem is controlled. Pertinent negatives include no anxiety, headaches, palpitations, peripheral edema, shortness of breath or sweats. Risk factors for coronary artery disease include dyslipidemia, male gender and sedentary lifestyle. Past treatments include calcium channel blockers. The current treatment provides moderate improvement. There is no history of kidney disease, CAD/MI, CVA, heart failure or a thyroid problem. There is no history of sleep apnea.  Hyperlipidemia This is a chronic problem. The current episode started more than 1 year ago. The problem is uncontrolled. Recent lipid tests were reviewed and are high. He has no history of diabetes or hypothyroidism. Factors aggravating his hyperlipidemia include smoking. Pertinent negatives include no leg pain or shortness of breath. Current antihyperlipidemic treatment includes diet change (PT was started on Crestor but stopped r/t muscle aches). The current treatment provides no improvement of lipids. Risk factors for coronary artery disease include dyslipidemia, hypertension, male sex and a sedentary lifestyle.  Gastrophageal Reflux He reports no belching, no coughing or no heartburn. This is a chronic problem. The current episode started more than 1 year ago. The problem occurs rarely. The problem has been resolved. The symptoms are aggravated by certain foods. He has tried a PPI for the symptoms. The treatment provided significant relief.      Review of Systems  Constitutional: Negative.   HENT: Negative.   Respiratory: Negative.  Negative for cough and shortness of breath.   Cardiovascular: Negative.  Negative for palpitations.  Gastrointestinal: Negative.  Negative for heartburn.  Endocrine:  Negative.   Genitourinary: Negative.   Musculoskeletal: Negative.   Neurological: Negative.  Negative for headaches.  Hematological: Negative.   Psychiatric/Behavioral: Negative.   All other systems reviewed and are negative.      Objective:   Physical Exam  Constitutional: He is oriented to person, place, and time. He appears well-developed and well-nourished. No distress.  HENT:  Head: Normocephalic.  Right Ear: External ear normal.  Left Ear: External ear normal.  Nose: Nose normal.  Mouth/Throat: Oropharynx is clear and moist.  Eyes: Pupils are equal, round, and reactive to light. Right eye exhibits no discharge. Left eye exhibits no discharge.  Neck: Normal range of motion. Neck supple. No thyromegaly present.  Cardiovascular: Normal rate, regular rhythm, normal heart sounds and intact distal pulses.   No murmur heard. Pulmonary/Chest: Effort normal and breath sounds normal. No respiratory distress. He has no wheezes.  Pectus carinatum  Abdominal: Soft. Bowel sounds are normal. He exhibits no distension. There is no tenderness.  Musculoskeletal: Normal range of motion. He exhibits no edema or tenderness.  Neurological: He is alert and oriented to person, place, and time. He has normal reflexes. No cranial nerve deficit.  Skin: Skin is warm and dry. No rash noted. No erythema.  Psychiatric: He has a normal mood and affect. His behavior is normal. Judgment and thought content normal.  Vitals reviewed.     BP 130/94 mmHg  Pulse 74  Temp(Src) 97.5 F (36.4 C) (Oral)  Ht 5\' 9"  (1.753 m)  Wt 203 lb 6.4 oz (92.262 kg)  BMI 30.02 kg/m2     Assessment & Plan:  1. Essential hypertension Pt to keep close eye on HTN at home-Pr states he will be on low salt diet  and exercise and does not want to change medications at this time -RTO 6 months to recheck - amLODipine (NORVASC) 5 MG tablet; Take 1 tablet (5 mg total) by mouth daily.  Dispense: 90 tablet; Refill: 3 - CMP14+EGFR;  Future  2. Hyperlipidemia with target LDL less than 100 -Pt states he is willing to go medication, but does not want to be Crestor bc of muscle pain  - CMP14+EGFR; Future - Lipid panel; Future  3. Gastroesophageal reflux disease, esophagitis presence not specified - omeprazole (PRILOSEC) 20 MG capsule; TAKE 1 CAPSULE (20 MG TOTAL) BY MOUTH DAILY.  Dispense: 90 capsule; Refill: 4 - CMP14+EGFR; Future   Continue all meds Labs ordered- Pt ate right before visit Health Maintenance reviewed Diet and exercise encouraged RTO 6 months  Evelina Dun, FNP

## 2014-07-07 ENCOUNTER — Ambulatory Visit: Payer: Self-pay | Admitting: Family Medicine

## 2014-07-07 LAB — CMP14+EGFR
ALBUMIN: 4.2 g/dL (ref 3.6–4.8)
ALK PHOS: 127 IU/L — AB (ref 39–117)
ALT: 73 IU/L — ABNORMAL HIGH (ref 0–44)
AST: 52 IU/L — AB (ref 0–40)
Albumin/Globulin Ratio: 1.6 (ref 1.1–2.5)
BILIRUBIN TOTAL: 0.4 mg/dL (ref 0.0–1.2)
BUN / CREAT RATIO: 16 (ref 10–22)
BUN: 17 mg/dL (ref 8–27)
CO2: 22 mmol/L (ref 18–29)
Calcium: 9.3 mg/dL (ref 8.6–10.2)
Chloride: 100 mmol/L (ref 97–108)
Creatinine, Ser: 1.06 mg/dL (ref 0.76–1.27)
GFR calc Af Amer: 86 mL/min/{1.73_m2} (ref >59)
GFR calc non Af Amer: 74 mL/min/{1.73_m2} (ref >59)
GLUCOSE: 92 mg/dL (ref 65–99)
Globulin, Total: 2.7 g/dL (ref 1.5–4.5)
Potassium: 4.5 mmol/L (ref 3.5–5.2)
Sodium: 139 mmol/L (ref 134–144)
Total Protein: 6.9 g/dL (ref 6.0–8.5)

## 2014-07-07 LAB — LIPID PANEL
CHOL/HDL RATIO: 6.4 ratio — AB (ref 0.0–5.0)
Cholesterol, Total: 225 mg/dL — ABNORMAL HIGH (ref 100–199)
HDL: 35 mg/dL — ABNORMAL LOW (ref >39)
LDL Calculated: 155 mg/dL — ABNORMAL HIGH (ref 0–99)
Triglycerides: 176 mg/dL — ABNORMAL HIGH (ref 0–149)
VLDL Cholesterol Cal: 35 mg/dL (ref 5–40)

## 2014-07-10 ENCOUNTER — Other Ambulatory Visit: Payer: Self-pay | Admitting: Family

## 2014-07-10 ENCOUNTER — Telehealth: Payer: Self-pay | Admitting: *Deleted

## 2014-07-10 MED ORDER — ATORVASTATIN CALCIUM 40 MG PO TABS: 40.0000 mg | ORAL_TABLET | Freq: Every day | ORAL | Status: DC

## 2014-07-10 NOTE — Telephone Encounter (Signed)
lmtcb regarding test results. 

## 2014-07-10 NOTE — Telephone Encounter (Signed)
-----   Message from Sharion Balloon, [place] sent at 07/10/2014  8:33 AM EDT ----- Kidney function stable- Liver function elevated- Pt needs to limit tylenol and alcohol intake- Will continue to monitor Cholesterol levels elevated- Lipitor rx sent to pharmacy

## 2014-07-13 ENCOUNTER — Telehealth: Payer: Self-pay | Admitting: *Deleted

## 2014-07-13 NOTE — Telephone Encounter (Signed)
-----   Message from Sharion Balloon, Bruce sent at 07/10/2014  8:33 AM EDT ----- Kidney function stable- Liver function elevated- Pt needs to limit tylenol and alcohol intake- Will continue to monitor Cholesterol levels elevated- Lipitor rx sent to pharmacy

## 2014-07-13 NOTE — Telephone Encounter (Signed)
Pt notified of results Verbalizes understanding 

## 2014-12-27 ENCOUNTER — Telehealth: Payer: Self-pay | Admitting: Family

## 2015-01-30 ENCOUNTER — Ambulatory Visit (INDEPENDENT_AMBULATORY_CARE_PROVIDER_SITE_OTHER): Payer: 59 | Admitting: Family Medicine

## 2015-01-30 ENCOUNTER — Ambulatory Visit (INDEPENDENT_AMBULATORY_CARE_PROVIDER_SITE_OTHER): Payer: 59

## 2015-01-30 ENCOUNTER — Encounter: Payer: Self-pay | Admitting: Family Medicine

## 2015-01-30 ENCOUNTER — Encounter (INDEPENDENT_AMBULATORY_CARE_PROVIDER_SITE_OTHER): Payer: Self-pay

## 2015-01-30 VITALS — BP 140/83 | HR 75 | Temp 97.0°F | Ht 69.0 in | Wt 202.0 lb

## 2015-01-30 DIAGNOSIS — I10 Essential (primary) hypertension: Secondary | ICD-10-CM

## 2015-01-30 DIAGNOSIS — K219 Gastro-esophageal reflux disease without esophagitis: Secondary | ICD-10-CM | POA: Diagnosis not present

## 2015-01-30 DIAGNOSIS — E785 Hyperlipidemia, unspecified: Secondary | ICD-10-CM

## 2015-01-30 DIAGNOSIS — M25551 Pain in right hip: Secondary | ICD-10-CM | POA: Diagnosis not present

## 2015-01-30 MED ORDER — AMLODIPINE BESYLATE 10 MG PO TABS: 10.0000 mg | ORAL_TABLET | Freq: Every day | ORAL | Status: DC

## 2015-01-30 MED ORDER — PREDNISONE 10 MG PO TABS: ORAL_TABLET | ORAL | Status: DC

## 2015-01-30 MED ORDER — ATORVASTATIN CALCIUM 40 MG PO TABS: 40.0000 mg | ORAL_TABLET | Freq: Every day | ORAL | Status: DC

## 2015-01-30 MED ORDER — KETOROLAC TROMETHAMINE 60 MG/2ML IM SOLN
60.0000 mg | Freq: Once | INTRAMUSCULAR | Status: AC
  Administered 2015-01-30: 60 mg via INTRAMUSCULAR

## 2015-01-30 NOTE — Progress Notes (Signed)
Subjective:  Patient ID: Terry Pacheco, male    DOB: November 19, 1950  Age: 64 y.o. MRN: [id number]  CC: Hip Pain   HPI Terry Pacheco presents for pain in hip. Started with stepping over the pet gate at home through the day a couple months ago.. Now it has progressed and is affectting daily chores and other activities such as yesterday's bowling. He can only play one game. This is unusual for him. He had to force himself to do that much. Throbbing pain. Right hip primarily affected with radiation to right buttocks. Ranges from 6-9/10. Worse with ambulation and activity.  History Terry Pacheco has a past medical history of Hypertension and GERD (gastroesophageal reflux disease) (07/06/2014).   He has past surgical history that includes Hernia repair.   His family history includes Heart disease in his mother; Stroke in his father.He reports that he quit smoking about 2 years ago. His smoking use included Cigarettes. He started smoking about 32 years ago. He smoked 1.00 pack per day. He does not have any smokeless tobacco history on file. He reports that he drinks alcohol. He reports that he does not use illicit drugs.  Outpatient Prescriptions Prior to Visit  Medication Sig Dispense Refill  . omeprazole (PRILOSEC) 20 MG capsule TAKE 1 CAPSULE (20 MG TOTAL) BY MOUTH DAILY. 90 capsule 4  . amLODipine (NORVASC) 5 MG tablet Take 1 tablet (5 mg total) by mouth daily. 90 tablet 3  . atorvastatin (LIPITOR) 40 MG tablet Take 1 tablet (40 mg total) by mouth daily. 90 tablet 3   No facility-administered medications prior to visit.    ROS Review of Systems  Constitutional: Positive for activity change. Negative for fever, chills and diaphoresis.  HENT: Negative for congestion, rhinorrhea and sore throat.   Respiratory: Negative for cough and chest tightness.   Cardiovascular: Negative for chest pain.  Musculoskeletal: Positive for myalgias, arthralgias and gait problem. Negative for back pain and joint swelling.   Skin: Negative for rash.  Neurological: Negative for weakness and headaches.    Objective:  BP 140/83 mmHg  Pulse 75  Temp(Src) 97 F (36.1 C) (Oral)  Ht _0  (1.753 m)  Wt 202 lb (91.627 kg)  BMI 29.82 kg/m2  SpO2 95%  BP Readings from Last 3 Encounters:  01/30/15 140/83  07/06/14 135/90  09/20/13 129/81    Wt Readings from Last 3 Encounters:  01/30/15 202 lb (91.627 kg)  07/06/14 203 lb 6.4 oz (92.262 kg)  09/20/13 201 lb (91.173 kg)     Physical Exam  Constitutional: He appears well-developed and well-nourished.  HENT:  Head: Normocephalic and atraumatic.  Eyes: Pupils are equal, round, and reactive to light.  Neck: Normal range of motion. Neck supple.  Cardiovascular: Normal rate and regular rhythm.   No murmur heard. Pulmonary/Chest: Breath sounds normal. No respiratory distress.  Abdominal: Soft.  Musculoskeletal: He exhibits tenderness (at tthe right greater trochanter. INcreased with external hip rotation. Left nml.).  Vitals reviewed.    Lab Results  Component Value Date   WBC 8.7 02/09/2013   HGB 15.0 02/09/2013   HCT 46.2 02/09/2013   PLT 242 04/23/2006   GLUCOSE 92 07/06/2014   CHOL 225* 07/06/2014   TRIG 176* 07/06/2014   HDL 35* 07/06/2014   LDLCALC 155* 07/06/2014   ALT 73* 07/06/2014   AST 52* 07/06/2014   NA 139 07/06/2014   K 4.5 07/06/2014   CL 100 07/06/2014   CREATININE 1.06 07/06/2014   BUN 17 07/06/2014  CO2 22 07/06/2014   TSH 0.985 02/08/2013   HGBA1C 5.2 02/08/2013    No results found.  Assessment & Plan:   Terry Pacheco was seen today for hip pain.  Diagnoses and all orders for this visit:  Pain in right hip -     CMP14+EGFR -     DG HIP UNILAT W OR W/O PELVIS MIN 4 VIEWS RIGHT; Future -     ketorolac (TORADOL) injection 60 mg; Inject 2 mLs (60 mg total) into the muscle once.  Essential hypertension -     CMP14+EGFR -     amLODipine (NORVASC) 10 MG tablet; Take 1 tablet (10 mg total) by mouth daily. For blood  pressure  Hyperlipidemia with target LDL less than 100 -     CMP14+EGFR -     Lipid panel  Gastroesophageal reflux disease, esophagitis presence not specified -     CMP14+EGFR  Other orders -     predniSONE (DELTASONE) 10 MG tablet; Take 5 daily for 3 days followed by 4,3,2 and 1 for 3 days each. -     atorvastatin (LIPITOR) 40 MG tablet; Take 1 tablet (40 mg total) by mouth daily.   I have changed Terry Pacheco amLODipine. I am also having him start on predniSONE. Additionally, I am having him maintain his omeprazole and atorvastatin. We administered ketorolac.  Meds ordered this encounter  Medications  . predniSONE (DELTASONE) 10 MG tablet    Sig: Take 5 daily for 3 days followed by 4,3,2 and 1 for 3 days each.    Dispense:  45 tablet    Refill:  0  . amLODipine (NORVASC) 10 MG tablet    Sig: Take 1 tablet (10 mg total) by mouth daily. For blood pressure    Dispense:  90 tablet    Refill:  1  . atorvastatin (LIPITOR) 40 MG tablet    Sig: Take 1 tablet (40 mg total) by mouth daily.    Dispense:  90 tablet    Refill:  3  . ketorolac (TORADOL) injection 60 mg    Sig:      Follow-up: Return in about 6 months (around 07/31/2015) for CPE.  Claretta Fraise, M.D.

## 2015-01-31 LAB — LIPID PANEL
CHOLESTEROL TOTAL: 187 mg/dL (ref 100–199)
Chol/HDL Ratio: 5.8 ratio units — ABNORMAL HIGH (ref 0.0–5.0)
HDL: 32 mg/dL — ABNORMAL LOW (ref >39)
LDL CALC: 129 mg/dL — AB (ref 0–99)
TRIGLYCERIDES: 129 mg/dL (ref 0–149)
VLDL CHOLESTEROL CAL: 26 mg/dL (ref 5–40)

## 2015-01-31 LAB — CMP14+EGFR
ALT: 26 IU/L (ref 0–44)
AST: 17 IU/L (ref 0–40)
Albumin/Globulin Ratio: 1.5 (ref 1.1–2.5)
Albumin: 4.3 g/dL (ref 3.6–4.8)
Alkaline Phosphatase: 167 IU/L — ABNORMAL HIGH (ref 39–117)
BUN/Creatinine Ratio: 14 (ref 10–22)
BUN: 15 mg/dL (ref 8–27)
Bilirubin Total: 0.6 mg/dL (ref 0.0–1.2)
CO2: 23 mmol/L (ref 18–29)
CREATININE: 1.1 mg/dL (ref 0.76–1.27)
Calcium: 9.2 mg/dL (ref 8.6–10.2)
Chloride: 98 mmol/L (ref 97–106)
GFR calc Af Amer: 82 mL/min/{1.73_m2} (ref >59)
GFR calc non Af Amer: 71 mL/min/{1.73_m2} (ref >59)
GLUCOSE: 98 mg/dL (ref 65–99)
Globulin, Total: 2.9 g/dL (ref 1.5–4.5)
Potassium: 4.5 mmol/L (ref 3.5–5.2)
Sodium: 136 mmol/L (ref 136–144)
Total Protein: 7.2 g/dL (ref 6.0–8.5)

## 2015-01-31 NOTE — Progress Notes (Signed)
Patient aware. Says he will take his medication daily.

## 2015-02-22 ENCOUNTER — Ambulatory Visit (INDEPENDENT_AMBULATORY_CARE_PROVIDER_SITE_OTHER): Payer: 59 | Admitting: Family Medicine

## 2015-02-22 VITALS — BP 112/70 | HR 85 | Temp 98.4°F | Ht 69.0 in | Wt 204.0 lb

## 2015-02-22 DIAGNOSIS — E785 Hyperlipidemia, unspecified: Secondary | ICD-10-CM | POA: Diagnosis not present

## 2015-02-22 DIAGNOSIS — I10 Essential (primary) hypertension: Secondary | ICD-10-CM | POA: Diagnosis not present

## 2015-02-22 DIAGNOSIS — M25551 Pain in right hip: Secondary | ICD-10-CM | POA: Diagnosis not present

## 2015-02-22 DIAGNOSIS — R05 Cough: Secondary | ICD-10-CM | POA: Diagnosis not present

## 2015-02-22 DIAGNOSIS — R059 Cough, unspecified: Secondary | ICD-10-CM

## 2015-02-22 MED ORDER — MELOXICAM 15 MG PO TABS: 15.0000 mg | ORAL_TABLET | Freq: Every day | ORAL | Status: DC

## 2015-02-22 MED ORDER — ESCITALOPRAM OXALATE 20 MG PO TABS: 10.0000 mg | ORAL_TABLET | Freq: Every day | ORAL | Status: DC

## 2015-02-22 NOTE — Progress Notes (Signed)
Subjective:  Patient ID: ROYD DEWING, male    DOB: Jul 20, 1950  Age: 64 y.o. MRN: [id number]  CC: Hypertension and Hip Pain   HPI RAEFORD MORINI presents for  follow-up of hypertension. Patient has no history of headache chest pain or shortness of breath or recent cough. Patient also denies symptoms of TIA such as numbness weakness lateralizing. Patient checks  blood pressure at home and has not had any elevated readings recently. Patient denies side effects from his medication. States taking it regularly.  Pain right hip 4/10 at rest. 7-8/10 with activity. Never really pain free. Minimal relief with prednisone taper. Interfering with sleep and activities that involve ambulation,  Bending from waist.  History Josuel has a past medical history of Hypertension and GERD (gastroesophageal reflux disease) (07/06/2014).   He has past surgical history that includes Hernia repair.   His family history includes Heart disease in his mother; Stroke in his father.He reports that he quit smoking about 2 years ago. His smoking use included Cigarettes. He started smoking about 32 years ago. He smoked 1.00 pack per day. He does not have any smokeless tobacco history on file. He reports that he drinks alcohol. He reports that he does not use illicit drugs.  Current Outpatient Prescriptions on File Prior to Visit  Medication Sig Dispense Refill  . amLODipine (NORVASC) 10 MG tablet Take 1 tablet (10 mg total) by mouth daily. For blood pressure 90 tablet 1  . atorvastatin (LIPITOR) 40 MG tablet Take 1 tablet (40 mg total) by mouth daily. 90 tablet 3  . omeprazole (PRILOSEC) 20 MG capsule TAKE 1 CAPSULE (20 MG TOTAL) BY MOUTH DAILY. 90 capsule 4   No current facility-administered medications on file prior to visit.    ROS Review of Systems  Constitutional: Negative for fever, chills, diaphoresis and unexpected weight change.  HENT: Negative for congestion, hearing loss, rhinorrhea and sore throat.   Eyes:  Negative for visual disturbance.  Respiratory: Negative for cough and shortness of breath.   Cardiovascular: Negative for chest pain.  Gastrointestinal: Negative for abdominal pain, diarrhea and constipation.  Genitourinary: Negative for dysuria and flank pain.  Musculoskeletal: Positive for arthralgias. Negative for joint swelling.  Skin: Negative for rash.  Neurological: Negative for dizziness and headaches.  Psychiatric/Behavioral: Negative for sleep disturbance and dysphoric mood.    Objective:  BP 112/70 mmHg  Pulse 85  Temp(Src) 98.4 F (36.9 C) (Oral)  Ht 5\' 9"  (1.753 m)  Wt 204 lb (92.534 kg)  BMI 30.11 kg/m2  BP Readings from Last 3 Encounters:  02/22/15 112/70  01/30/15 140/83  07/06/14 135/90    Wt Readings from Last 3 Encounters:  02/22/15 204 lb (92.534 kg)  01/30/15 202 lb (91.627 kg)  07/06/14 203 lb 6.4 oz (92.262 kg)     Physical Exam  Constitutional: He is oriented to person, place, and time. He appears well-developed and well-nourished. No distress.  HENT:  Head: Normocephalic and atraumatic.  Right Ear: External ear normal.  Left Ear: External ear normal.  Nose: Nose normal.  Mouth/Throat: Oropharynx is clear and moist.  Eyes: Conjunctivae and EOM are normal. Pupils are equal, round, and reactive to light.  Neck: Normal range of motion. Neck supple. No thyromegaly present.  Cardiovascular: Normal rate, regular rhythm and normal heart sounds.   No murmur heard. Pulmonary/Chest: Effort normal and breath sounds normal. No respiratory distress. He has no wheezes. He has no rales.  Abdominal: Soft. Bowel sounds are normal. He exhibits  no distension. There is no tenderness.  Musculoskeletal: He exhibits tenderness (right hip laterally over bursa).  Lymphadenopathy:    He has no cervical adenopathy.  Neurological: He is alert and oriented to person, place, and time. He has normal reflexes.  Skin: Skin is warm and dry.  Psychiatric: He has a normal mood  and affect. His behavior is normal. Judgment and thought content normal.    Lab Results  Component Value Date   HGBA1C 5.2 02/08/2013    Lab Results  Component Value Date   WBC 8.7 02/09/2013   HGB 15.0 02/09/2013   HCT 46.2 02/09/2013   PLT 242 04/23/2006   GLUCOSE 98 01/30/2015   CHOL 187 01/30/2015   TRIG 129 01/30/2015   HDL 32* 01/30/2015   LDLCALC 129* 01/30/2015   ALT 26 01/30/2015   AST 17 01/30/2015   NA 136 01/30/2015   K 4.5 01/30/2015   CL 98 01/30/2015   CREATININE 1.10 01/30/2015   BUN 15 01/30/2015   CO2 23 01/30/2015   TSH 0.985 02/08/2013   HGBA1C 5.2 02/08/2013  A steroid injection was performed using sterileprep and drape at the right greater trochanteric bursa using 1% plain Lidocaine and 8 mg of Celestone. This was well tolerated.   Assessment & Plan:   Michelangelo was seen today for hypertension and hip pain.  Diagnoses and all orders for this visit:  Right hip pain -     PR DRAIN/INJECT LARGE JOINT/BURSA -     meloxicam (MOBIC) 15 MG tablet; Take 1 tablet (15 mg total) by mouth daily. For joint and muscle pain  Essential hypertension  Hyperlipidemia with target LDL less than 100  Cough  Other orders -     escitalopram (LEXAPRO) 20 MG tablet; Take 0.5 tablets (10 mg total) by mouth daily.  I have discontinued Mr. Corsino predniSONE. I have also changed his escitalopram. Additionally, I am having him start on meloxicam. Lastly, I am having him maintain his omeprazole, amLODipine, and atorvastatin.  Meds ordered this encounter  Medications  . DISCONTD: escitalopram (LEXAPRO) 20 MG tablet    Sig: Take 10 mg by mouth daily.  Marland Kitchen escitalopram (LEXAPRO) 20 MG tablet    Sig: Take 0.5 tablets (10 mg total) by mouth daily.    Dispense:  45 tablet    Refill:  3  . meloxicam (MOBIC) 15 MG tablet    Sig: Take 1 tablet (15 mg total) by mouth daily. For joint and muscle pain    Dispense:  30 tablet    Refill:  5     Follow-up: No Follow-up on  file.  Claretta Fraise, M.D.

## 2015-02-26 ENCOUNTER — Encounter: Payer: Self-pay | Admitting: Family Medicine

## 2015-04-05 ENCOUNTER — Ambulatory Visit (INDEPENDENT_AMBULATORY_CARE_PROVIDER_SITE_OTHER): Payer: 59 | Admitting: Family

## 2015-04-05 ENCOUNTER — Encounter: Payer: Self-pay | Admitting: Family

## 2015-04-05 VITALS — BP 132/80 | HR 73 | Temp 97.3°F | Ht 69.0 in | Wt 200.0 lb

## 2015-04-05 DIAGNOSIS — E8881 Metabolic syndrome: Secondary | ICD-10-CM

## 2015-04-05 DIAGNOSIS — E785 Hyperlipidemia, unspecified: Secondary | ICD-10-CM | POA: Diagnosis not present

## 2015-04-05 DIAGNOSIS — K219 Gastro-esophageal reflux disease without esophagitis: Secondary | ICD-10-CM | POA: Diagnosis not present

## 2015-04-05 DIAGNOSIS — Z1159 Encounter for screening for other viral diseases: Secondary | ICD-10-CM

## 2015-04-05 DIAGNOSIS — E669 Obesity, unspecified: Secondary | ICD-10-CM | POA: Diagnosis not present

## 2015-04-05 DIAGNOSIS — Z125 Encounter for screening for malignant neoplasm of prostate: Secondary | ICD-10-CM | POA: Diagnosis not present

## 2015-04-05 DIAGNOSIS — F411 Generalized anxiety disorder: Secondary | ICD-10-CM

## 2015-04-05 DIAGNOSIS — Z1211 Encounter for screening for malignant neoplasm of colon: Secondary | ICD-10-CM

## 2015-04-05 DIAGNOSIS — M1611 Unilateral primary osteoarthritis, right hip: Secondary | ICD-10-CM

## 2015-04-05 DIAGNOSIS — I10 Essential (primary) hypertension: Secondary | ICD-10-CM

## 2015-04-05 NOTE — Progress Notes (Signed)
Subjective:    Patient ID: Terry Pacheco, male    DOB: 1950/04/22, 65 y.o.   MRN: [id number]  Pt presents to the office today for chronic follow up.  Arthritis Presents for follow-up visit. He complains of pain and joint warmth. Affected locations include the right hip. His pain is at a severity of 3/10. Pertinent negatives include no dysuria or uveitis. His past medical history is significant for osteoarthritis. Past treatments include rest and NSAIDs. The treatment provided moderate relief.  Hypertension This is a chronic problem. The current episode started more than 1 year ago. The problem has been resolved since onset. The problem is controlled. Pertinent negatives include no anxiety, headaches, palpitations, peripheral edema, shortness of breath or sweats. Risk factors for coronary artery disease include dyslipidemia, male gender and sedentary lifestyle. Past treatments include calcium channel blockers. The current treatment provides moderate improvement. There is no history of kidney disease, CAD/MI, CVA, heart failure or a thyroid problem. There is no history of sleep apnea.  Hyperlipidemia This is a chronic problem. The current episode started more than 1 year ago. The problem is uncontrolled. Recent lipid tests were reviewed and are high. He has no history of diabetes or hypothyroidism. Factors aggravating his hyperlipidemia include smoking. Pertinent negatives include no leg pain or shortness of breath. Current antihyperlipidemic treatment includes diet change and statins. The current treatment provides no improvement of lipids. Risk factors for coronary artery disease include dyslipidemia, hypertension, male sex and a sedentary lifestyle.  Gastroesophageal Reflux He reports no belching, no coughing or no heartburn. This is a chronic problem. The current episode started more than 1 year ago. The problem occurs rarely. The problem has been resolved. The symptoms are aggravated by certain  foods. He has tried a PPI for the symptoms. The treatment provided significant relief.  Anxiety Presents for follow-up visit. Onset was more than 5 years ago. The problem has been waxing and waning. Symptoms include irritability. Patient reports no excessive worry, insomnia, nervous/anxious behavior, palpitations or shortness of breath. Symptoms occur occasionally. The symptoms are aggravated by family issues. The quality of sleep is good.        Review of Systems  Constitutional: Positive for irritability.  HENT: Negative.   Respiratory: Negative.  Negative for cough and shortness of breath.   Cardiovascular: Negative.  Negative for palpitations.  Gastrointestinal: Negative.  Negative for heartburn.  Endocrine: Negative.   Genitourinary: Negative.  Negative for dysuria.  Musculoskeletal: Positive for arthritis.  Neurological: Negative.  Negative for headaches.  Hematological: Negative.   Psychiatric/Behavioral: Negative.  The patient is not nervous/anxious and does not have insomnia.   All other systems reviewed and are negative.      Objective:   Physical Exam  Constitutional: He is oriented to person, place, and time. He appears well-developed and well-nourished. No distress.  HENT:  Head: Normocephalic.  Right Ear: External ear normal.  Left Ear: External ear normal.  Nose: Nose normal.  Mouth/Throat: Oropharynx is clear and moist.  Eyes: Pupils are equal, round, and reactive to light. Right eye exhibits no discharge. Left eye exhibits no discharge.  Neck: Normal range of motion. Neck supple. No thyromegaly present.  Cardiovascular: Normal rate, regular rhythm, normal heart sounds and intact distal pulses.   No murmur heard. Pulmonary/Chest: Effort normal and breath sounds normal. No respiratory distress. He has no wheezes.  Pectus excavatum present   Abdominal: Soft. Bowel sounds are normal. He exhibits no distension. There is no tenderness.  Musculoskeletal:  Normal  range of motion. He exhibits no edema or tenderness.  Neurological: He is alert and oriented to person, place, and time. He has normal reflexes. No cranial nerve deficit.  Skin: Skin is warm and dry. No rash noted. No erythema.  Psychiatric: He has a normal mood and affect. His behavior is normal. Judgment and thought content normal.  Vitals reviewed.     BP 132/80 mmHg  Pulse 73  Temp(Src) 97.3 F (36.3 C) (Oral)  Ht _0  (1.753 m)  Wt 200 lb (90.719 kg)  BMI 29.52 kg/m2     Assessment & Plan:  1. Essential hypertension - CMP14+EGFR  2. Gastroesophageal reflux disease, esophagitis presence not specified - CMP14+EGFR  3. Hyperlipidemia with target LDL less than 100 - CMP14+EGFR - Lipid panel  4. Primary osteoarthritis of right hip - CMP14+EGFR  5. GAD (generalized anxiety disorder) - OHC09+TVMT  6. Metabolic syndrome - NZD82+UVHA  7. Obese - CMP14+EGFR  8. Prostate cancer screening  - CMP14+EGFR - PSA, total and free  9. Need for hepatitis C screening test - CMP14+EGFR - Hepatitis C antibody  10. Colon cancer screening - Ambulatory referral to Gastroenterology - CMP14+EGFR   Continue all meds Labs pending Health Maintenance reviewed Diet and exercise encouraged RTO 6 months   Evelina Dun, FNP

## 2015-04-05 NOTE — Patient Instructions (Signed)

## 2015-04-06 LAB — PSA, TOTAL AND FREE
PSA FREE: 0.25 ng/mL
PSA, Free Pct: 22.7 %
Prostate Specific Ag, Serum: 1.1 ng/mL (ref 0.0–4.0)

## 2015-04-06 LAB — CMP14+EGFR
ALBUMIN: 4.4 g/dL (ref 3.6–4.8)
ALT: 18 IU/L (ref 0–44)
AST: 18 IU/L (ref 0–40)
Albumin/Globulin Ratio: 1.7 (ref 1.1–2.5)
Alkaline Phosphatase: 173 IU/L — ABNORMAL HIGH (ref 39–117)
BUN/Creatinine Ratio: 16 (ref 10–22)
BUN: 16 mg/dL (ref 8–27)
Bilirubin Total: 0.6 mg/dL (ref 0.0–1.2)
CALCIUM: 9.5 mg/dL (ref 8.6–10.2)
CO2: 22 mmol/L (ref 18–29)
CREATININE: 0.99 mg/dL (ref 0.76–1.27)
Chloride: 97 mmol/L (ref 96–106)
GFR calc Af Amer: 93 mL/min/{1.73_m2} (ref >59)
GFR, EST NON AFRICAN AMERICAN: 80 mL/min/{1.73_m2} (ref >59)
GLOBULIN, TOTAL: 2.6 g/dL (ref 1.5–4.5)
Glucose: 88 mg/dL (ref 65–99)
Potassium: 4.6 mmol/L (ref 3.5–5.2)
SODIUM: 136 mmol/L (ref 134–144)
Total Protein: 7 g/dL (ref 6.0–8.5)

## 2015-04-06 LAB — LIPID PANEL
CHOL/HDL RATIO: 4.1 ratio (ref 0.0–5.0)
Cholesterol, Total: 146 mg/dL (ref 100–199)
HDL: 36 mg/dL — ABNORMAL LOW (ref >39)
LDL CALC: 82 mg/dL (ref 0–99)
TRIGLYCERIDES: 140 mg/dL (ref 0–149)
VLDL Cholesterol Cal: 28 mg/dL (ref 5–40)

## 2015-04-06 LAB — HEPATITIS C ANTIBODY: Hep C Virus Ab: <0.1 s/co ratio (ref 0.0–0.9)

## 2015-07-22 ENCOUNTER — Other Ambulatory Visit: Payer: Self-pay | Admitting: Family Medicine

## 2015-08-01 ENCOUNTER — Encounter: Payer: 59 | Admitting: Family Medicine

## 2015-08-03 ENCOUNTER — Ambulatory Visit (INDEPENDENT_AMBULATORY_CARE_PROVIDER_SITE_OTHER): Payer: 59 | Admitting: Family Medicine

## 2015-08-03 ENCOUNTER — Encounter: Payer: Self-pay | Admitting: Family Medicine

## 2015-08-03 VITALS — BP 125/78 | HR 75 | Ht 69.0 in | Wt 201.1 lb

## 2015-08-03 DIAGNOSIS — Z125 Encounter for screening for malignant neoplasm of prostate: Secondary | ICD-10-CM

## 2015-08-03 DIAGNOSIS — F411 Generalized anxiety disorder: Secondary | ICD-10-CM

## 2015-08-03 DIAGNOSIS — Z0000 Encounter for general adult medical examination without abnormal findings: Secondary | ICD-10-CM | POA: Diagnosis not present

## 2015-08-03 DIAGNOSIS — E785 Hyperlipidemia, unspecified: Secondary | ICD-10-CM

## 2015-08-03 DIAGNOSIS — M25551 Pain in right hip: Secondary | ICD-10-CM

## 2015-08-03 DIAGNOSIS — I10 Essential (primary) hypertension: Secondary | ICD-10-CM | POA: Diagnosis not present

## 2015-08-03 DIAGNOSIS — M1611 Unilateral primary osteoarthritis, right hip: Secondary | ICD-10-CM

## 2015-08-03 NOTE — Progress Notes (Signed)
Subjective:   Terry Pacheco is a 65 y.o. male who presents for an Initial Medicare Annual Wellness Visit.  Review of Systems  Review of Systems  Constitutional: Negative for fever, chills, weight loss, malaise/fatigue and diaphoresis.  HENT: Negative for congestion, ear pain, hearing loss, nosebleeds, sore throat and tinnitus.   Eyes: Negative for blurred vision, double vision, photophobia, pain, discharge and redness.  Respiratory: Negative for cough, hemoptysis, sputum production, shortness of breath and wheezing.   Cardiovascular: Negative for chest pain, palpitations, orthopnea, leg swelling and PND.  Gastrointestinal: Negative for heartburn, nausea, vomiting, abdominal pain, diarrhea, constipation, blood in stool and melena.  Genitourinary: Negative for dysuria, urgency, frequency, hematuria and flank pain.  Musculoskeletal: Positive for joint pain (right hip). Negative for myalgias, back pain, falls and neck pain.  Skin: Negative for itching and rash.  Neurological: Negative for dizziness, tingling, tremors, sensory change, speech change, focal weakness, seizures, loss of consciousness, weakness and headaches.  Endo/Heme/Allergies: Negative for environmental allergies and polydipsia. Does not bruise/bleed easily.  Psychiatric/Behavioral: Negative for depression, suicidal ideas, hallucinations, memory loss and substance abuse. The patient is not nervous/anxious and does not have insomnia.     Current Medications (verified) Outpatient Encounter Prescriptions as of 08/03/2015  Medication Sig  . amLODipine (NORVASC) 10 MG tablet TAKE 1 TABLET (10 MG TOTAL) BY MOUTH DAILY. FOR BLOOD PRESSURE  . atorvastatin (LIPITOR) 40 MG tablet Take 1 tablet (40 mg total) by mouth daily.  Marland Kitchen escitalopram (LEXAPRO) 20 MG tablet Take 0.5 tablets (10 mg total) by mouth daily.  . meloxicam (MOBIC) 15 MG tablet Take 1 tablet (15 mg total) by mouth daily. For joint and muscle pain  . omeprazole (PRILOSEC) 20  MG capsule TAKE 1 CAPSULE (20 MG TOTAL) BY MOUTH DAILY.   No facility-administered encounter medications on file as of 08/03/2015.    Allergies (verified) Crestor   History: Past Medical History  Diagnosis Date  . Hypertension   . GERD (gastroesophageal reflux disease) 07/06/2014   Past Surgical History  Procedure Laterality Date  . Hernia repair     Family History  Problem Relation Age of Onset  . Heart disease Mother   . Stroke Father    Social History   Occupational History  . Not on file.   Social History Main Topics  . Smoking status: Current Every Day Smoker -- 0.50 packs/day    Types: Cigarettes    Start date: 01/12/1983    Last Attempt to Quit: 11/11/2012  . Smokeless tobacco: Not on file  . Alcohol Use: 0.0 oz/week    0 Standard drinks or equivalent per week     Comment: rare  . Drug Use: No  . Sexual Activity: Not on file    Do you feel safe at home?  Yes Are there smokers in your home (other than you)? No  Dietary issues and exercise activities discussed: Current Exercise Habits: The patient does not participate in regular exercise at present  Current Dietary habits:  Regular diet       Objective:    Today's Vitals   08/03/15 1110  BP: 125/78  Pulse: 75  Height: '5\' 9"'$  (1.753 m)  Weight: 201 lb 2 oz (91.23 kg)   Body mass index is 29.69 kg/(m^2).  Physical Exam  Constitutional: He is oriented to person, place, and time. He appears well-developed.  HENT:  Head: Normocephalic and atraumatic.  Right Ear: External ear normal.  Left Ear: External ear normal.  Nose: Nose normal.  Mouth/Throat: Oropharynx is clear and moist.  Eyes: Conjunctivae and EOM are normal. Pupils are equal, round, and reactive to light. Right eye exhibits no discharge. Left eye exhibits no discharge.  Neck: Normal range of motion. Neck supple. No JVD present. No thyromegaly present.  Cardiovascular: Normal rate, regular rhythm and normal heart sounds.  Exam reveals no  gallop and no friction rub.   No murmur heard. Pulmonary/Chest: Effort normal and breath sounds normal. No stridor. No respiratory distress. He has no wheezes. He has no rales. He exhibits no tenderness.  Abdominal: Soft. Bowel sounds are normal. He exhibits no distension and no mass. There is no tenderness. There is no rebound and no guarding. No hernia.  Lymphadenopathy:    He has no cervical adenopathy.  Neurological: He is alert and oriented to person, place, and time. He displays normal reflexes. No cranial nerve deficit. He exhibits normal muscle tone. Coordination normal.  Skin: Skin is warm and dry. No rash noted. No erythema.  Psychiatric: He has a normal mood and affect. His behavior is normal. Judgment and thought content normal.  Vitals reviewed.   Activities of Daily Living In your present state of health, do you have any difficulty performing the following activities: 08/03/2015  Hearing? N  Vision? N  Difficulty concentrating or making decisions? N  Walking or climbing stairs? N  Dressing or bathing? N  Doing errands, shopping? N     Depression Screen PHQ 2/9 Scores 08/03/2015 02/22/2015 01/30/2015 03/14/2013  PHQ - 2 Score 0 0 0 0     Fall Risk Fall Risk  08/03/2015 02/22/2015 01/30/2015 03/14/2013  Falls in the past year? No No No No    Cognitive Function: No flowsheet data found.  Immunizations and Health Maintenance  There is no immunization history on file for this patient. There are no preventive care reminders to display for this patient.  Patient Care Team: Sharion Balloon, FNP as PCP - General (Nurse Practitioner)  Indicate any recent Medical Services you may have received from other than [facility] providers in the past year (date may be approximate).    Assessment:    Annual Wellness Visit    Screening Tests Health Maintenance  Topic Date Due  . COLON CANCER SCREENING ANNUAL FOBT  02/02/2016 (Originally 07/27/2000)  . ZOSTAVAX  02/02/2016 (Originally  07/28/2010)  . TETANUS/TDAP  02/02/2016 (Originally 07/27/1969)  . PNA vac Low Risk Adult (1 of 2 - PCV13) 02/02/2016 (Originally 07/28/2015)  . INFLUENZA VACCINE  09/25/2015  . COLONOSCOPY  09/24/2016  . Hepatitis C Screening  Completed  . HIV Screening  Addressed        Plan:   During the course of the visit Jamichael was educated and counseled about the following appropriate screening and preventive services:   Vaccines to include Pneumoccal, Influenza,  Td, Zostavax,  Colorectal cancer screening  Cardiovascular disease screening  Diabetes screening  Bone Denisty / Osteoporosis Screening  Glaucoma screening / Diabetic Eye Exam  Nutrition counseling  Prostate cancer screening  Smoking cessation counseling  Advanced Directives  Physical Activity   Goals    . Weight < 175 lb (79.379 kg)      1. Welcome to Medicare preventive visit   2. Essential hypertension   3. Hyperlipidemia with target LDL less than 100   4. Right hip pain   5. Primary osteoarthritis of right hip   6. GAD (generalized anxiety disorder)   7. Screening for prostate cancer     No orders of  the defined types were placed in this encounter.    Orders Placed This Encounter  Procedures  . CBC with Differential/Platelet  . CMP14+EGFR    Order Specific Question:  Has the patient fasted?    Answer:  Yes  . Lipid panel    Order Specific Question:  Has the patient fasted?    Answer:  Yes  . PSA Total (Reflex To Free)  . Ambulatory referral to Orthopedic Surgery    Referral Priority:  Routine    Referral Type:  Surgical    Referral Reason:  Specialty Services Required    Requested Specialty:  Orthopedic Surgery    Number of Visits Requested:  1  . EKG 12-Lead    Labs pending Health Maintenance reviewed Diet and exercise encouraged Continue all meds as discussed Follow up in 6 mos   Claretta Fraise, MD     Patient Instructions (the written plan) were given to the patient.    Claretta Fraise, MD   08/03/2015

## 2015-08-04 LAB — CMP14+EGFR
ALT: 16 IU/L (ref 0–44)
AST: 20 IU/L (ref 0–40)
Albumin/Globulin Ratio: 1.8 (ref 1.2–2.2)
Albumin: 4.4 g/dL (ref 3.6–4.8)
Alkaline Phosphatase: 149 IU/L — ABNORMAL HIGH (ref 39–117)
BUN/Creatinine Ratio: 19 (ref 10–24)
BUN: 21 mg/dL (ref 8–27)
Bilirubin Total: 0.8 mg/dL (ref 0.0–1.2)
CO2: 21 mmol/L (ref 18–29)
Calcium: 9.2 mg/dL (ref 8.6–10.2)
Chloride: 99 mmol/L (ref 96–106)
Creatinine, Ser: 1.08 mg/dL (ref 0.76–1.27)
GFR calc Af Amer: 83 mL/min/1.73 (ref >59)
GFR calc non Af Amer: 72 mL/min/1.73 (ref >59)
Globulin, Total: 2.5 g/dL (ref 1.5–4.5)
Glucose: 96 mg/dL (ref 65–99)
Potassium: 4.5 mmol/L (ref 3.5–5.2)
Sodium: 137 mmol/L (ref 134–144)
Total Protein: 6.9 g/dL (ref 6.0–8.5)

## 2015-08-04 LAB — LIPID PANEL
CHOL/HDL RATIO: 3.9 ratio (ref 0.0–5.0)
Cholesterol, Total: 138 mg/dL (ref 100–199)
HDL: 35 mg/dL — ABNORMAL LOW (ref >39)
LDL CALC: 83 mg/dL (ref 0–99)
TRIGLYCERIDES: 101 mg/dL (ref 0–149)
VLDL Cholesterol Cal: 20 mg/dL (ref 5–40)

## 2015-08-04 LAB — CBC WITH DIFFERENTIAL/PLATELET
Basophils Absolute: 0 10*3/uL (ref 0.0–0.2)
Basos: 0 %
EOS (ABSOLUTE): 0.4 10*3/uL (ref 0.0–0.4)
Eos: 4 %
HEMATOCRIT: 44.6 % (ref 37.5–51.0)
HEMOGLOBIN: 14.7 g/dL (ref 12.6–17.7)
Immature Grans (Abs): 0 10*3/uL (ref 0.0–0.1)
Immature Granulocytes: 0 %
LYMPHS ABS: 2 10*3/uL (ref 0.7–3.1)
Lymphs: 23 %
MCH: 30.6 pg (ref 26.6–33.0)
MCHC: 33 g/dL (ref 31.5–35.7)
MCV: 93 fL (ref 79–97)
MONOS ABS: 0.7 10*3/uL (ref 0.1–0.9)
Monocytes: 8 %
NEUTROS PCT: 65 %
Neutrophils Absolute: 5.5 10*3/uL (ref 1.4–7.0)
Platelets: 240 10*3/uL (ref 150–379)
RBC: 4.81 x10E6/uL (ref 4.14–5.80)
RDW: 13.2 % (ref 12.3–15.4)
WBC: 8.5 10*3/uL (ref 3.4–10.8)

## 2015-08-04 LAB — PSA TOTAL (REFLEX TO FREE): Prostate Specific Ag, Serum: 1 ng/mL (ref 0.0–4.0)

## 2015-08-16 ENCOUNTER — Other Ambulatory Visit: Payer: Self-pay | Admitting: Family

## 2015-08-22 ENCOUNTER — Other Ambulatory Visit: Payer: Self-pay | Admitting: Family Medicine

## 2015-08-28 ENCOUNTER — Other Ambulatory Visit: Payer: Self-pay | Admitting: Family

## 2015-10-02 ENCOUNTER — Telehealth: Payer: Self-pay | Admitting: Family Medicine

## 2015-10-07 NOTE — H&P (Signed)
TOTAL HIP ADMISSION H&P  Patient is admitted for right total hip arthroplasty, anterior approach.  Subjective:  Chief Complaint:    Right hip primary OA / pain  HPI: Terry Pacheco, 65 y.o. male, has a history of pain and functional disability in the right hip(s) due to arthritis and patient has failed non-surgical conservative treatments for greater than 12 weeks to include NSAID's and/or analgesics, corticosteriod injections and activity modification.  Onset of symptoms was gradual starting 1+ years ago with gradually worsening course since that time.The patient noted no past surgery on the right hip(s).  Patient currently rates pain in the right hip at 8 out of 10 with activity. Patient has night pain, worsening of pain with activity and weight bearing, trendelenberg gait, pain that interfers with activities of daily living and pain with passive range of motion. Patient has evidence of periarticular osteophytes and joint space narrowing by imaging studies. This condition presents safety issues increasing the risk of falls.  There is no current active infection.  Risks, benefits and expectations were discussed with the patient.  Risks including but not limited to the risk of anesthesia, blood clots, nerve damage, blood vessel damage, failure of the prosthesis, infection and up to and including death.  Patient understand the risks, benefits and expectations and wishes to proceed with surgery.   PCP: No primary care provider on file.  D/C Plans:      Home  Post-op Meds:       No Rx given   Tranexamic Acid:      To be given - IV  Decadron:      Is to be given  FYI:     ASA  Norco  Nicotine Patch 7 mg    Patient Active Problem List   Diagnosis Date Noted  . Osteoarthritis of right hip 04/05/2015  . GAD (generalized anxiety disorder) 04/05/2015  . Metabolic syndrome [id number]  . Obese 04/05/2015  . GERD (gastroesophageal reflux disease) 07/06/2014  . Hypertension 03/14/2013  .  Hyperlipidemia with target LDL less than 100 03/14/2013   Past Medical History:  Diagnosis Date  . GERD (gastroesophageal reflux disease) 07/06/2014  . Hypertension     Past Surgical History:  Procedure Laterality Date  . HERNIA REPAIR      No prescriptions prior to admission.   Allergies  Allergen Reactions  . Crestor [Rosuvastatin]     Muscle aches    Social History  Substance Use Topics  . Smoking status: Current Every Day Smoker    Packs/day: 0.50    Types: Cigarettes    Start date: 01/12/1983    Last attempt to quit: 11/11/2012  . Smokeless tobacco: Not on file  . Alcohol use 0.0 oz/week     Comment: rare    Family History  Problem Relation Age of Onset  . Heart disease Mother   . Stroke Father      Review of Systems  Constitutional: Negative.   HENT: Negative.   Eyes: Negative.   Respiratory: Negative.   Cardiovascular: Negative.   Gastrointestinal: Positive for heartburn.  Genitourinary: Negative.   Musculoskeletal: Positive for joint pain.  Skin: Negative.   Neurological: Negative.   Endo/Heme/Allergies: Negative.   Psychiatric/Behavioral: The patient is nervous/anxious.     Objective:  Physical Exam  Constitutional: He is oriented to person, place, and time. He appears well-developed.  HENT:  Head: Normocephalic.  Eyes: Pupils are equal, round, and reactive to light.  Neck: Neck supple. No JVD present. No tracheal  deviation present. No thyromegaly present.  Cardiovascular: Normal rate, regular rhythm, normal heart sounds and intact distal pulses.   Respiratory: Effort normal and breath sounds normal. No respiratory distress. He has no wheezes.  GI: Soft. There is no tenderness. There is no guarding.  Musculoskeletal:       Right hip: He exhibits decreased range of motion, decreased strength, tenderness and bony tenderness. He exhibits no swelling, no deformity and no laceration.  Lymphadenopathy:    He has no cervical adenopathy.  Neurological:  He is alert and oriented to person, place, and time.  Skin: Skin is warm and dry.  Psychiatric: He has a normal mood and affect.      Labs:  Estimated body mass index is 29.7 kg/m as calculated from the following:   Height as of 08/03/15: 5\' 9"  (1.753 m).   Weight as of 08/03/15: 91.2 kg (201 lb 2 oz).   Imaging Review Plain radiographs demonstrate severe degenerative joint disease of the right hip(s). The bone quality appears to be good for age and reported activity level.  Assessment/Plan:  End stage arthritis, right hip(s)  The patient history, physical examination, clinical judgement of the provider and imaging studies are consistent with end stage degenerative joint disease of the right hip(s) and total hip arthroplasty is deemed medically necessary. The treatment options including medical management, injection therapy, arthroscopy and arthroplasty were discussed at length. The risks and benefits of total hip arthroplasty were presented and reviewed. The risks due to aseptic loosening, infection, stiffness, dislocation/subluxation,  thromboembolic complications and other imponderables were discussed.  The patient acknowledged the explanation, agreed to proceed with the plan and consent was signed. Patient is being admitted for inpatient treatment for surgery, pain control, PT, OT, prophylactic antibiotics, VTE prophylaxis, progressive ambulation and ADL's and discharge planning.The patient is planning to be discharged home.    Terry Pugh Brooks Kinnan   PA-C  10/07/2015, 11:16 PM

## 2015-10-10 ENCOUNTER — Encounter (HOSPITAL_COMMUNITY): Payer: Self-pay

## 2015-10-10 ENCOUNTER — Encounter (HOSPITAL_COMMUNITY)
Admission: RE | Admit: 2015-10-10 | Discharge: 2015-10-10 | Disposition: A | Discharge status: Home / Self Care | Payer: 59 | Source: Ambulatory Visit | Attending: Orthopedic Surgery | Admitting: Orthopedic Surgery

## 2015-10-10 DIAGNOSIS — Z01812 Encounter for preprocedural laboratory examination: Secondary | ICD-10-CM | POA: Diagnosis not present

## 2015-10-10 HISTORY — DX: Sleep apnea, unspecified: G47.30

## 2015-10-10 HISTORY — DX: Unspecified osteoarthritis, unspecified site: M19.90

## 2015-10-10 HISTORY — DX: Presence of spectacles and contact lenses: Z97.3

## 2015-10-10 LAB — CBC
HEMATOCRIT: 44.6 % (ref 39.0–52.0)
Hemoglobin: 15.2 g/dL (ref 13.0–17.0)
MCH: 30.6 pg (ref 26.0–34.0)
MCHC: 34.1 g/dL (ref 30.0–36.0)
MCV: 89.9 fL (ref 78.0–100.0)
Platelets: 217 10*3/uL (ref 150–400)
RBC: 4.96 MIL/uL (ref 4.22–5.81)
RDW: 13.2 % (ref 11.5–15.5)
WBC: 8.7 10*3/uL (ref 4.0–10.5)

## 2015-10-10 LAB — BASIC METABOLIC PANEL
ANION GAP: 8 (ref 5–15)
BUN: 21 mg/dL — AB (ref 6–20)
CALCIUM: 9.1 mg/dL (ref 8.9–10.3)
CO2: 23 mmol/L (ref 22–32)
Chloride: 106 mmol/L (ref 101–111)
Creatinine, Ser: 1.04 mg/dL (ref 0.61–1.24)
GFR calc Af Amer: >60 mL/min (ref >60)
GFR calc non Af Amer: >60 mL/min (ref >60)
GLUCOSE: 105 mg/dL — AB (ref 65–99)
Potassium: 4.1 mmol/L (ref 3.5–5.1)
Sodium: 137 mmol/L (ref 135–145)

## 2015-10-10 LAB — PROTIME-INR
INR: 0.95
Prothrombin Time: 12.7 seconds (ref 11.4–15.2)

## 2015-10-10 LAB — SURGICAL PCR SCREEN
MRSA, PCR: NEGATIVE
Staphylococcus aureus: NEGATIVE

## 2015-10-10 LAB — ABO/RH: ABO/RH(D): A POS

## 2015-10-10 NOTE — Patient Instructions (Signed)
Terry Pacheco  10/10/2015   Your procedure is scheduled on: Tuesday October 16, 2015  Report to [facility] Main  Entrance take [place]  elevators to 3rd floor to  [place] at 7:00 AM.  Call this number if you have problems the morning of surgery [phone]   Remember: ONLY 1 PERSON MAY GO WITH YOU TO SHORT STAY TO GET  READY MORNING OF [place].  Do not eat food or drink liquids :After Midnight.     Take these medicines the morning of surgery with A SIP OF WATER: Amlodipine (Norvasc); Escitalopram (Lexapro); Omeprazole (Prilosec)                               You may not have any metal on your body including hair pins and              piercings  Do not wear jewelry, lotions, powders or colognes, deodorant             Men may shave face and neck.   Do not bring valuables to the hospital. [place].  Contacts, dentures or bridgework may not be worn into surgery.  Leave suitcase in the car. After surgery it may be brought to your room.    Special Instructions: NO SMOKING 24 HOURS PRIOR TO SURGICAL PROCEDURE DATE               Please read over the following fact sheets you were given:MRSA INFORMATION SHEET; INCENTIVE SPIROMETER; BLOOD TRANSFUSION INFORMATION SHEET  _____________________________________________________________________             [facility] - Preparing for Surgery Before surgery, you can play an important role.  Because skin is not sterile, your skin needs to be as free of germs as possible.  You can reduce the number of germs on your skin by washing with CHG (chlorahexidine gluconate) soap before surgery.  CHG is an antiseptic cleaner which kills germs and bonds with the skin to continue killing germs even after washing. Please DO NOT use if you have an allergy to CHG or antibacterial soaps.  If your skin becomes reddened/irritated stop using the CHG and inform your nurse when you  arrive at Short Stay. Do not shave (including legs and underarms) for at least 48 hours prior to the first CHG shower.  You may shave your face/neck. Please follow these instructions carefully:  1.  Shower with CHG Soap the night before surgery and the  morning of Surgery.  2.  If you choose to wash your hair, wash your hair first as usual with your  normal  shampoo.  3.  After you shampoo, rinse your hair and body thoroughly to remove the  shampoo.                           4.  Use CHG as you would any other liquid soap.  You can apply chg directly  to the skin and wash                       Gently with a scrungie or clean washcloth.  5.  Apply the CHG Soap to your  body ONLY FROM THE NECK DOWN.   Do not use on face/ open                           Wound or open sores. Avoid contact with eyes, ears mouth and genitals (private parts).                       Wash face,  Genitals (private parts) with your normal soap.             6.  Wash thoroughly, paying special attention to the area where your surgery  will be performed.  7.  Thoroughly rinse your body with warm water from the neck down.  8.  DO NOT shower/wash with your normal soap after using and rinsing off  the CHG Soap.                9.  Pat yourself dry with a clean towel.            10.  Wear clean pajamas.            11.  Place clean sheets on your bed the night of your first shower and do not  sleep with pets. Day of Surgery : Do not apply any lotions/deodorants the morning of surgery.  Please wear clean clothes to the hospital/surgery center.  FAILURE TO FOLLOW THESE INSTRUCTIONS MAY RESULT IN THE CANCELLATION OF YOUR SURGERY PATIENT SIGNATURE_________________________________  NURSE SIGNATURE__________________________________  ________________________________________________________________________   Adam Phenix  An incentive spirometer is a tool that can help keep your lungs clear and active. This tool measures how  well you are filling your lungs with each breath. Taking long deep breaths may help reverse or decrease the chance of developing breathing (pulmonary) problems (especially infection) following:  A long period of time when you are unable to move or be active. BEFORE THE PROCEDURE   If the spirometer includes an indicator to show your best effort, your nurse or respiratory therapist will set it to a desired goal.  If possible, sit up straight or lean slightly forward. Try not to slouch.  Hold the incentive spirometer in an upright position. INSTRUCTIONS FOR USE  1. Sit on the edge of your bed if possible, or sit up as far as you can in bed or on a chair. 2. Hold the incentive spirometer in an upright position. 3. Breathe out normally. 4. Place the mouthpiece in your mouth and seal your lips tightly around it. 5. Breathe in slowly and as deeply as possible, raising the piston or the ball toward the top of the column. 6. Hold your breath for 3-5 seconds or for as long as possible. Allow the piston or ball to fall to the bottom of the column. 7. Remove the mouthpiece from your mouth and breathe out normally. 8. Rest for a few seconds and repeat Steps 1 through 7 at least 10 times every 1-2 hours when you are awake. Take your time and take a few normal breaths between deep breaths. 9. The spirometer may include an indicator to show your best effort. Use the indicator as a goal to work toward during each repetition. 10. After each set of 10 deep breaths, practice coughing to be sure your lungs are clear. If you have an incision (the cut made at the time of surgery), support your incision when coughing by placing a pillow or rolled up towels firmly against it.  Once you are able to get out of bed, walk around indoors and cough well. You may stop using the incentive spirometer when instructed by your caregiver.  RISKS AND COMPLICATIONS  Take your time so you do not get dizzy or light-headed.  If you  are in pain, you may need to take or ask for pain medication before doing incentive spirometry. It is harder to take a deep breath if you are having pain. AFTER USE  Rest and breathe slowly and easily.  It can be helpful to keep track of a log of your progress. Your caregiver can provide you with a simple table to help with this. If you are using the spirometer at home, follow these instructions: [place] IF:   You are having difficultly using the spirometer.  You have trouble using the spirometer as often as instructed.  Your pain medication is not giving enough relief while using the spirometer.  You develop fever of 100.5 F (38.1 C) or higher. SEEK IMMEDIATE MEDICAL CARE IF:   You cough up bloody sputum that had not been present before.  You develop fever of 102 F (38.9 C) or greater.  You develop worsening pain at or near the incision site. MAKE SURE YOU:   Understand these instructions.  Will watch your condition.  Will get help right away if you are not doing well or get worse. Document Released: 06/23/2006 Document Revised: 05/05/2011 Document Reviewed: 08/24/2006 Exit[employer] Patient Information 2014 Exit[employer], [place].   ________________________________________________________________________  WHAT IS A BLOOD TRANSFUSION? Blood Transfusion Information  A transfusion is the replacement of blood or some of its parts. Blood is made up of multiple cells which provide different functions.  Red blood cells carry oxygen and are used for blood loss replacement.  White blood cells fight against infection.  Platelets control bleeding.  Plasma helps clot blood.  Other blood products are available for specialized needs, such as hemophilia or other clotting disorders. BEFORE THE TRANSFUSION  Who gives blood for transfusions?   Healthy volunteers who are fully evaluated to make sure their blood is safe. This is blood bank blood. Transfusion therapy is the safest  it has ever been in the practice of medicine. Before blood is taken from a donor, a complete history is taken to make sure that person has no history of diseases nor engages in risky social behavior (examples are intravenous drug use or sexual activity with multiple partners). The donor's travel history is screened to minimize risk of transmitting infections, such as malaria. The donated blood is tested for signs of infectious diseases, such as HIV and hepatitis. The blood is then tested to be sure it is compatible with you in order to minimize the chance of a transfusion reaction. If you or a relative donates blood, this is often done in anticipation of surgery and is not appropriate for emergency situations. It takes many days to process the donated blood. RISKS AND COMPLICATIONS Although transfusion therapy is very safe and saves many lives, the main dangers of transfusion include:   Getting an infectious disease.  Developing a transfusion reaction. This is an allergic reaction to something in the blood you were given. Every precaution is taken to prevent this. The decision to have a blood transfusion has been considered carefully by your caregiver before blood is given. Blood is not given unless the benefits outweigh the risks. AFTER THE TRANSFUSION  Right after receiving a blood transfusion, you will usually feel much better and more energetic.  This is especially true if your red blood cells have gotten low (anemic). The transfusion raises the level of the red blood cells which carry oxygen, and this usually causes an energy increase.  The nurse administering the transfusion will monitor you carefully for complications. HOME CARE INSTRUCTIONS  No special instructions are needed after a transfusion. You may find your energy is better. Speak with your caregiver about any limitations on activity for underlying diseases you may have. SEEK MEDICAL CARE IF:   Your condition is not improving after  your transfusion.  You develop redness or irritation at the intravenous (IV) site. SEEK IMMEDIATE MEDICAL CARE IF:  Any of the following symptoms occur over the next 12 hours:  Shaking chills.  You have a temperature by mouth above 102 F (38.9 C), not controlled by medicine.  Chest, back, or muscle pain.  People around you feel you are not acting correctly or are confused.  Shortness of breath or difficulty breathing.  Dizziness and fainting.  You get a rash or develop hives.  You have a decrease in urine output.  Your urine turns a dark color or changes to pink, red, or brown. Any of the following symptoms occur over the next 10 days:  You have a temperature by mouth above 102 F (38.9 C), not controlled by medicine.  Shortness of breath.  Weakness after normal activity.  The white part of the eye turns yellow (jaundice).  You have a decrease in the amount of urine or are urinating less often.  Your urine turns a dark color or changes to pink, red, or brown. Document Released: 02/08/2000 Document Revised: 05/05/2011 Document Reviewed: 09/27/2007 [facility] Patient Information 2014 [place], [place].  _______________________________________________________________________

## 2015-10-16 ENCOUNTER — Inpatient Hospital Stay (HOSPITAL_COMMUNITY): Payer: 59

## 2015-10-16 ENCOUNTER — Encounter (HOSPITAL_COMMUNITY): Payer: Self-pay | Admitting: *Deleted

## 2015-10-16 ENCOUNTER — Inpatient Hospital Stay (HOSPITAL_COMMUNITY): Payer: 59 | Admitting: Registered Nurse

## 2015-10-16 ENCOUNTER — Inpatient Hospital Stay (HOSPITAL_COMMUNITY)
Admission: RE | Admit: 2015-10-16 | Discharge: 2015-10-17 | DRG: 470 | Disposition: A | Discharge status: Home Health | Payer: 59 | Source: Ambulatory Visit | Attending: Orthopedic Surgery | Admitting: Orthopedic Surgery

## 2015-10-16 ENCOUNTER — Encounter (HOSPITAL_COMMUNITY)
Admission: RE | Disposition: A | Discharge status: Home Health | Payer: Self-pay | Source: Ambulatory Visit | Attending: Orthopedic Surgery

## 2015-10-16 DIAGNOSIS — Z8249 Family history of ischemic heart disease and other diseases of the circulatory system: Secondary | ICD-10-CM | POA: Diagnosis not present

## 2015-10-16 DIAGNOSIS — E8881 Metabolic syndrome: Secondary | ICD-10-CM | POA: Diagnosis present

## 2015-10-16 DIAGNOSIS — K219 Gastro-esophageal reflux disease without esophagitis: Secondary | ICD-10-CM | POA: Diagnosis present

## 2015-10-16 DIAGNOSIS — I10 Essential (primary) hypertension: Secondary | ICD-10-CM | POA: Diagnosis present

## 2015-10-16 DIAGNOSIS — M25551 Pain in right hip: Secondary | ICD-10-CM

## 2015-10-16 DIAGNOSIS — Z96641 Presence of right artificial hip joint: Secondary | ICD-10-CM | POA: Diagnosis not present

## 2015-10-16 DIAGNOSIS — G4730 Sleep apnea, unspecified: Secondary | ICD-10-CM | POA: Diagnosis present

## 2015-10-16 DIAGNOSIS — Z888 Allergy status to other drugs, medicaments and biological substances status: Secondary | ICD-10-CM | POA: Diagnosis not present

## 2015-10-16 DIAGNOSIS — F17210 Nicotine dependence, cigarettes, uncomplicated: Secondary | ICD-10-CM | POA: Diagnosis present

## 2015-10-16 DIAGNOSIS — M1611 Unilateral primary osteoarthritis, right hip: Principal | ICD-10-CM | POA: Diagnosis present

## 2015-10-16 DIAGNOSIS — E785 Hyperlipidemia, unspecified: Secondary | ICD-10-CM | POA: Diagnosis present

## 2015-10-16 DIAGNOSIS — Z823 Family history of stroke: Secondary | ICD-10-CM | POA: Diagnosis not present

## 2015-10-16 DIAGNOSIS — Z471 Aftercare following joint replacement surgery: Secondary | ICD-10-CM | POA: Diagnosis not present

## 2015-10-16 DIAGNOSIS — Z09 Encounter for follow-up examination after completed treatment for conditions other than malignant neoplasm: Secondary | ICD-10-CM

## 2015-10-16 DIAGNOSIS — F411 Generalized anxiety disorder: Secondary | ICD-10-CM | POA: Diagnosis present

## 2015-10-16 HISTORY — PX: TOTAL HIP ARTHROPLASTY: SHX124

## 2015-10-16 LAB — TYPE AND SCREEN
ABO/RH(D): A POS
ANTIBODY SCREEN: NEGATIVE

## 2015-10-16 SURGERY — ARTHROPLASTY, HIP, TOTAL, ANTERIOR APPROACH
Anesthesia: Spinal | Site: Hip | Laterality: Right

## 2015-10-16 MED ORDER — FERROUS SULFATE 325 (65 FE) MG PO TABS
325.0000 mg | ORAL_TABLET | Freq: Three times a day (TID) | ORAL | Status: DC
  Administered 2015-10-16 – 2015-10-17 (×2): 325 mg via ORAL
  Filled 2015-10-16 (×2): qty 1

## 2015-10-16 MED ORDER — MIDAZOLAM HCL 5 MG/5ML IJ SOLN
INTRAMUSCULAR | Status: DC | PRN
  Administered 2015-10-16: 2 mg via INTRAVENOUS

## 2015-10-16 MED ORDER — CEFAZOLIN SODIUM-DEXTROSE 2-4 GM/100ML-% IV SOLN
INTRAVENOUS | Status: AC
  Filled 2015-10-16: qty 100

## 2015-10-16 MED ORDER — METOCLOPRAMIDE HCL 5 MG/ML IJ SOLN: 5.0000 mg | Freq: Three times a day (TID) | INTRAMUSCULAR | Status: DC | PRN

## 2015-10-16 MED ORDER — METHOCARBAMOL 500 MG PO TABS
500.0000 mg | ORAL_TABLET | Freq: Four times a day (QID) | ORAL | Status: DC | PRN
  Administered 2015-10-16 (×2): 500 mg via ORAL
  Filled 2015-10-16 (×2): qty 1

## 2015-10-16 MED ORDER — PHENYLEPHRINE HCL 10 MG/ML IJ SOLN
INTRAMUSCULAR | Status: DC | PRN
  Administered 2015-10-16: 30 ug/min via INTRAVENOUS

## 2015-10-16 MED ORDER — SODIUM CHLORIDE 0.9 % IV SOLN
INTRAVENOUS | Status: DC
  Administered 2015-10-16: 14:00:00 via INTRAVENOUS

## 2015-10-16 MED ORDER — DEXAMETHASONE SODIUM PHOSPHATE 10 MG/ML IJ SOLN
10.0000 mg | Freq: Once | INTRAMUSCULAR | Status: AC
  Administered 2015-10-16: 10 mg via INTRAVENOUS

## 2015-10-16 MED ORDER — FENTANYL CITRATE (PF) 100 MCG/2ML IJ SOLN
INTRAMUSCULAR | Status: AC
  Filled 2015-10-16: qty 2

## 2015-10-16 MED ORDER — CEFAZOLIN SODIUM-DEXTROSE 2-4 GM/100ML-% IV SOLN
2.0000 g | INTRAVENOUS | Status: AC
  Administered 2015-10-16: 2 g via INTRAVENOUS
  Filled 2015-10-16: qty 100

## 2015-10-16 MED ORDER — HYDROCODONE-ACETAMINOPHEN 7.5-325 MG PO TABS: 1.0000 | ORAL_TABLET | ORAL | 0 refills | Status: DC | PRN

## 2015-10-16 MED ORDER — PHENYLEPHRINE HCL 10 MG/ML IJ SOLN
INTRAMUSCULAR | Status: AC
Start: 2015-10-16 — End: 2015-10-16
  Filled 2015-10-16: qty 1

## 2015-10-16 MED ORDER — MENTHOL 3 MG MT LOZG: 1.0000 | LOZENGE | OROMUCOSAL | Status: DC | PRN

## 2015-10-16 MED ORDER — ASPIRIN 81 MG PO CHEW
81.0000 mg | CHEWABLE_TABLET | Freq: Two times a day (BID) | ORAL | Status: DC
  Administered 2015-10-16: 81 mg via ORAL
  Filled 2015-10-16: qty 1

## 2015-10-16 MED ORDER — PHENOL 1.4 % MT LIQD
1.0000 | OROMUCOSAL | Status: DC | PRN
  Filled 2015-10-16: qty 177

## 2015-10-16 MED ORDER — FENTANYL CITRATE (PF) 100 MCG/2ML IJ SOLN: 25.0000 ug | INTRAMUSCULAR | Status: DC | PRN

## 2015-10-16 MED ORDER — METOCLOPRAMIDE HCL 5 MG PO TABS: 5.0000 mg | ORAL_TABLET | Freq: Three times a day (TID) | ORAL | Status: DC | PRN

## 2015-10-16 MED ORDER — PROPOFOL 10 MG/ML IV BOLUS
INTRAVENOUS | Status: AC
  Filled 2015-10-16: qty 60

## 2015-10-16 MED ORDER — LACTATED RINGERS IV SOLN: INTRAVENOUS | Status: DC

## 2015-10-16 MED ORDER — PANTOPRAZOLE SODIUM 40 MG PO TBEC
40.0000 mg | DELAYED_RELEASE_TABLET | Freq: Every day | ORAL | Status: DC
  Administered 2015-10-17: 40 mg via ORAL
  Filled 2015-10-16: qty 1

## 2015-10-16 MED ORDER — POLYETHYLENE GLYCOL 3350 17 G PO PACK
17.0000 g | PACK | Freq: Two times a day (BID) | ORAL | Status: DC
  Administered 2015-10-16 – 2015-10-17 (×2): 17 g via ORAL
  Filled 2015-10-16 (×2): qty 1

## 2015-10-16 MED ORDER — ACETAMINOPHEN 325 MG PO TABS: 650.0000 mg | ORAL_TABLET | Freq: Four times a day (QID) | ORAL | Status: DC | PRN

## 2015-10-16 MED ORDER — MEPERIDINE HCL 50 MG/ML IJ SOLN: 6.2500 mg | INTRAMUSCULAR | Status: DC | PRN

## 2015-10-16 MED ORDER — ONDANSETRON HCL 4 MG/2ML IJ SOLN: 4.0000 mg | Freq: Four times a day (QID) | INTRAMUSCULAR | Status: DC | PRN

## 2015-10-16 MED ORDER — MIDAZOLAM HCL 2 MG/2ML IJ SOLN
INTRAMUSCULAR | Status: AC
  Filled 2015-10-16: qty 2

## 2015-10-16 MED ORDER — TRANEXAMIC ACID 1000 MG/10ML IV SOLN
1000.0000 mg | INTRAVENOUS | Status: AC
  Administered 2015-10-16: 1000 mg via INTRAVENOUS
  Filled 2015-10-16: qty 1100

## 2015-10-16 MED ORDER — BUPIVACAINE IN DEXTROSE 0.75-8.25 % IT SOLN
INTRATHECAL | Status: DC | PRN
  Administered 2015-10-16: 2 mL via INTRATHECAL

## 2015-10-16 MED ORDER — METOCLOPRAMIDE HCL 5 MG/ML IJ SOLN: 10.0000 mg | Freq: Once | INTRAMUSCULAR | Status: DC | PRN

## 2015-10-16 MED ORDER — AMLODIPINE BESYLATE 10 MG PO TABS
10.0000 mg | ORAL_TABLET | Freq: Every day | ORAL | Status: DC
  Administered 2015-10-17: 10 mg via ORAL
  Filled 2015-10-16: qty 1

## 2015-10-16 MED ORDER — METHOCARBAMOL 1000 MG/10ML IJ SOLN
500.0000 mg | Freq: Four times a day (QID) | INTRAVENOUS | Status: DC | PRN
  Administered 2015-10-16: 500 mg via INTRAVENOUS
  Filled 2015-10-16: qty 5
  Filled 2015-10-16: qty 550

## 2015-10-16 MED ORDER — DEXAMETHASONE SODIUM PHOSPHATE 10 MG/ML IJ SOLN
INTRAMUSCULAR | Status: AC
  Filled 2015-10-16: qty 1

## 2015-10-16 MED ORDER — ATORVASTATIN CALCIUM 20 MG PO TABS
40.0000 mg | ORAL_TABLET | Freq: Every day | ORAL | Status: DC
  Administered 2015-10-17: 40 mg via ORAL
  Filled 2015-10-16: qty 2

## 2015-10-16 MED ORDER — CEFAZOLIN IN D5W 1 GM/50ML IV SOLN
1.0000 g | Freq: Four times a day (QID) | INTRAVENOUS | Status: AC
  Administered 2015-10-16 (×2): 1 g via INTRAVENOUS
  Filled 2015-10-16 (×2): qty 50

## 2015-10-16 MED ORDER — PROPOFOL 500 MG/50ML IV EMUL
INTRAVENOUS | Status: DC | PRN
  Administered 2015-10-16: 50 ug/kg/min via INTRAVENOUS

## 2015-10-16 MED ORDER — DEXAMETHASONE SODIUM PHOSPHATE 10 MG/ML IJ SOLN
INTRAMUSCULAR | Status: AC
Start: 2015-10-16 — End: 2015-10-16
  Filled 2015-10-16: qty 1

## 2015-10-16 MED ORDER — ACETAMINOPHEN 650 MG RE SUPP: 650.0000 mg | Freq: Four times a day (QID) | RECTAL | Status: DC | PRN

## 2015-10-16 MED ORDER — LACTATED RINGERS IV SOLN
INTRAVENOUS | Status: DC
  Administered 2015-10-16 (×3): via INTRAVENOUS

## 2015-10-16 MED ORDER — SODIUM CHLORIDE 0.9 % IR SOLN
Status: DC | PRN
  Administered 2015-10-16: 1000 mL

## 2015-10-16 MED ORDER — FENTANYL CITRATE (PF) 100 MCG/2ML IJ SOLN
INTRAMUSCULAR | Status: DC | PRN
  Administered 2015-10-16: 100 ug via INTRAVENOUS

## 2015-10-16 MED ORDER — TRANEXAMIC ACID 1000 MG/10ML IV SOLN
1000.0000 mg | Freq: Once | INTRAVENOUS | Status: AC
  Administered 2015-10-16: 1000 mg via INTRAVENOUS
  Filled 2015-10-16: qty 1100

## 2015-10-16 MED ORDER — LIDOCAINE HCL (CARDIAC) 20 MG/ML IV SOLN
INTRAVENOUS | Status: AC
  Filled 2015-10-16: qty 5

## 2015-10-16 MED ORDER — DEXAMETHASONE SODIUM PHOSPHATE 10 MG/ML IJ SOLN
10.0000 mg | Freq: Once | INTRAMUSCULAR | Status: AC
  Administered 2015-10-17: 10 mg via INTRAVENOUS
  Filled 2015-10-16: qty 1

## 2015-10-16 MED ORDER — ASPIRIN 81 MG PO CHEW: 81.0000 mg | CHEWABLE_TABLET | Freq: Two times a day (BID) | ORAL | 0 refills | Status: DC

## 2015-10-16 MED ORDER — ESCITALOPRAM OXALATE 10 MG PO TABS
10.0000 mg | ORAL_TABLET | Freq: Every day | ORAL | Status: DC
  Administered 2015-10-17: 10 mg via ORAL
  Filled 2015-10-16: qty 1

## 2015-10-16 MED ORDER — DIPHENHYDRAMINE HCL 12.5 MG/5ML PO ELIX: 12.5000 mg | ORAL_SOLUTION | ORAL | Status: DC | PRN

## 2015-10-16 MED ORDER — CHLORHEXIDINE GLUCONATE 4 % EX LIQD
60.0000 mL | Freq: Once | CUTANEOUS | Status: DC
Start: 2015-10-16 — End: 2015-10-16

## 2015-10-16 MED ORDER — DOCUSATE SODIUM 100 MG PO CAPS
100.0000 mg | ORAL_CAPSULE | Freq: Two times a day (BID) | ORAL | Status: DC
  Administered 2015-10-16 – 2015-10-17 (×2): 100 mg via ORAL
  Filled 2015-10-16 (×2): qty 1

## 2015-10-16 MED ORDER — METHOCARBAMOL 500 MG PO TABS: 500.0000 mg | ORAL_TABLET | Freq: Four times a day (QID) | ORAL | 0 refills | Status: DC | PRN

## 2015-10-16 MED ORDER — HYDROCODONE-ACETAMINOPHEN 7.5-325 MG PO TABS
1.0000 | ORAL_TABLET | ORAL | Status: DC | PRN
  Administered 2015-10-16: 1 via ORAL
  Administered 2015-10-16: 2 via ORAL
  Administered 2015-10-16: 1 via ORAL
  Administered 2015-10-16 – 2015-10-17 (×4): 2 via ORAL
  Filled 2015-10-16: qty 1
  Filled 2015-10-16 (×4): qty 2
  Filled 2015-10-16: qty 1
  Filled 2015-10-16: qty 2

## 2015-10-16 MED ORDER — ALUM & MAG HYDROXIDE-SIMETH 200-200-20 MG/5ML PO SUSP: 30.0000 mL | ORAL | Status: DC | PRN

## 2015-10-16 MED ORDER — ONDANSETRON HCL 4 MG PO TABS: 4.0000 mg | ORAL_TABLET | Freq: Four times a day (QID) | ORAL | Status: DC | PRN

## 2015-10-16 SURGICAL SUPPLY — 42 items
ADH SKN CLS APL DERMABOND .7 (GAUZE/BANDAGES/DRESSINGS) ×1
BAG DECANTER FOR FLEXI CONT (MISCELLANEOUS) IMPLANT
BAG SPEC THK2 15X12 ZIP CLS (MISCELLANEOUS)
BAG ZIPLOCK 12X15 (MISCELLANEOUS) IMPLANT
CAPT HIP TOTAL 2 ×1 IMPLANT
CLOTH BEACON ORANGE TIMEOUT ST (SAFETY) ×2 IMPLANT
COVER PERINEAL POST (MISCELLANEOUS) ×2 IMPLANT
DERMABOND ADVANCED (GAUZE/BANDAGES/DRESSINGS) ×1
DERMABOND ADVANCED .7 DNX12 (GAUZE/BANDAGES/DRESSINGS) IMPLANT
DRAPE STERI IOBAN 125X83 (DRAPES) ×2 IMPLANT
DRAPE U-SHAPE 47X51 STRL (DRAPES) ×4 IMPLANT
DRESSING AQUACEL AG SP 3.5X10 (GAUZE/BANDAGES/DRESSINGS) ×1 IMPLANT
DRSG AQUACEL AG ADV 3.5X10 (GAUZE/BANDAGES/DRESSINGS) ×1 IMPLANT
DRSG AQUACEL AG SP 3.5X10 (GAUZE/BANDAGES/DRESSINGS) ×2
DURAPREP 26ML APPLICATOR (WOUND CARE) ×2 IMPLANT
ELECT REM PT RETURN 15FT ADLT (MISCELLANEOUS) IMPLANT
ELECT REM PT RETURN 9FT ADLT (ELECTROSURGICAL) ×2
ELECTRODE REM PT RTRN 9FT ADLT (ELECTROSURGICAL) ×1 IMPLANT
GLOVE BIOGEL M STRL SZ7.5 (GLOVE) IMPLANT
GLOVE BIOGEL PI IND STRL 7.5 (GLOVE) ×1 IMPLANT
GLOVE BIOGEL PI IND STRL 8 (GLOVE) IMPLANT
GLOVE BIOGEL PI IND STRL 8.5 (GLOVE) ×1 IMPLANT
GLOVE BIOGEL PI INDICATOR 7.5 (GLOVE) ×1
GLOVE BIOGEL PI INDICATOR 8 (GLOVE) ×1
GLOVE BIOGEL PI INDICATOR 8.5 (GLOVE)
GLOVE ECLIPSE 8.0 STRL XLNG CF (GLOVE) ×4 IMPLANT
GLOVE ORTHO TXT STRL SZ7.5 (GLOVE) ×2 IMPLANT
GOWN STRL REUS W/TWL LRG LVL3 (GOWN DISPOSABLE) ×2 IMPLANT
GOWN STRL REUS W/TWL XL LVL3 (GOWN DISPOSABLE) ×2 IMPLANT
HOLDER FOLEY CATH W/STRAP (MISCELLANEOUS) ×2 IMPLANT
LIQUID BAND (GAUZE/BANDAGES/DRESSINGS) ×2 IMPLANT
PACK ANTERIOR HIP CUSTOM (KITS) ×2 IMPLANT
SAW OSC TIP CART 19.5X105X1.3 (SAW) ×2 IMPLANT
SUT MNCRL AB 4-0 PS2 18 (SUTURE) ×2 IMPLANT
SUT VIC AB 1 CT1 36 (SUTURE) ×6 IMPLANT
SUT VIC AB 2-0 CT1 27 (SUTURE) ×4
SUT VIC AB 2-0 CT1 TAPERPNT 27 (SUTURE) ×2 IMPLANT
SUT VLOC 180 0 24IN GS25 (SUTURE) ×2 IMPLANT
TRAY FOLEY W/METER SILVER 14FR (SET/KITS/TRAYS/PACK) IMPLANT
TRAY FOLEY W/METER SILVER 16FR (SET/KITS/TRAYS/PACK) ×1 IMPLANT
WATER STERILE IRR 1500ML POUR (IV SOLUTION) ×2 IMPLANT
YANKAUER SUCT BULB TIP 10FT TU (MISCELLANEOUS) ×1 IMPLANT

## 2015-10-16 NOTE — Op Note (Signed)
NAME:  Terry Pacheco                ACCOUNT NO.: [id number]      MEDICAL RECORD NO.: [id number]      FACILITY:  [facility]      PHYSICIAN:  Paralee Cancel D  DATE OF BIRTH:  [birth year]     DATE OF PROCEDURE:  10/16/2015                                 OPERATIVE REPORT         PREOPERATIVE DIAGNOSIS: Right  hip osteoarthritis.      POSTOPERATIVE DIAGNOSIS:  Right hip osteoarthritis.      PROCEDURE:  Right total hip replacement through an anterior approach   utilizing DePuy THR system, component size 52mm pinnacle cup, a size 36+4 neutral   Altrex liner, a size 8 Hi Tri Lock stem with a 36=5 delta ceramic   ball.      SURGEON:  Pietro Cassis. Alvan Dame, M.D.      ASSISTANT:  Molli Barrows, PA-C     ANESTHESIA:  Spinal.      SPECIMENS:  None.      COMPLICATIONS:  None.      BLOOD LOSS:  250 cc     DRAINS:  None.      INDICATION OF THE PROCEDURE:  Terry Pacheco is a 65 y.o. male who had   presented to office for evaluation of right hip pain.  Radiographs revealed   progressive degenerative changes with bone-on-bone   articulation to the  hip joint.  The patient had painful limited range of   motion significantly affecting their overall quality of life.  The patient was failing to    respond to conservative measures, and at this point was ready   to proceed with more definitive measures.  The patient has noted progressive   degenerative changes in his hip, progressive problems and dysfunction   with regarding the hip prior to surgery.  Consent was obtained for   benefit of pain relief.  Specific risk of infection, DVT, component   failure, dislocation, need for revision surgery, as well discussion of   the anterior versus posterior approach were reviewed.  Consent was   obtained for benefit of anterior pain relief through an anterior   approach.      PROCEDURE IN DETAIL:  The patient was brought to operative theater.   Once adequate anesthesia, preoperative  antibiotics, 2gm of Ancef, 1 gm of Tranexamic Acid, and 10 mg of Decadron administered.   The patient was positioned supine on the OSI [place] table.  Once adequate   padding of boney process was carried out, we had predraped out the hip, and  used fluoroscopy to confirm orientation of the pelvis and position.      The right hip was then prepped and draped from proximal iliac crest to   mid thigh with shower curtain technique.      Time-out was performed identifying the patient, planned procedure, and   extremity.     An incision was then made 2 cm distal and lateral to the   anterior superior iliac spine extending over the orientation of the   tensor fascia lata muscle and sharp dissection was carried down to the   fascia of the muscle and protractor placed in the soft tissues.      The fascia was  then incised.  The muscle belly was identified and swept   laterally and retractor placed along the superior neck.  Following   cauterization of the circumflex vessels and removing some pericapsular   fat, a second cobra retractor was placed on the inferior neck.  A third   retractor was placed on the anterior acetabulum after elevating the   anterior rectus.  A L-capsulotomy was along the line of the   superior neck to the trochanteric fossa, then extended proximally and   distally.  Tag sutures were placed and the retractors were then placed   intracapsular.  We then identified the trochanteric fossa and   orientation of my neck cut, confirmed this radiographically   and then made a neck osteotomy with the femur on traction.  The femoral   head was removed without difficulty or complication.  Traction was let   off and retractors were placed posterior and anterior around the   acetabulum.      The labrum and foveal tissue were debrided.  I began reaming with a 29mm   reamer and reamed up to 30mm reamer with good bony bed preparation and a 36mm   cup was chosen.  The final 65mm Pinnacle cup  was then impacted under fluoroscopy  to confirm the depth of penetration and orientation with respect to   abduction.  A screw was placed followed by the hole eliminator.  The final   36+4 neutral Altrex liner was impacted with good visualized rim fit.  The cup was positioned anatomically within the acetabular portion of the pelvis.      At this point, the femur was rolled at 80 degrees.  Further capsule was   released off the inferior aspect of the femoral neck.  I then   released the superior capsule proximally.  The hook was placed laterally   along the femur and elevated manually and held in position with the bed   hook.  The leg was then extended and adducted with the leg rolled to 100   degrees of external rotation.  Once the proximal femur was fully   exposed, I used a box osteotome to set orientation.  I then began   broaching with the starting chili pepper broach and passed this by hand and then broached up to 8.  With the 8 broach in place I chose a high offset neck and did several trial reductions.  The offset was appropriate, leg lengths   appeared to be equal best matched with the +5 head ball confirmed radiographically.   Given these findings, I went ahead and dislocated the hip, repositioned all   retractors and positioned the right hip in the extended and abducted position.  The final 8Hi Tri Lock stem was   chosen and it was impacted down to the level of neck cut.  Based on this   and the trial reduction, a 36+5 delta ceramic ball was chosen and   impacted onto a clean and dry trunnion, and the hip was reduced.  The   hip had been irrigated throughout the case again at this point.  I did   reapproximate the superior capsular leaflet to the anterior leaflet   using #1 Vicryl.  The fascia of the   tensor fascia lata muscle was then reapproximated using #1 Vicryl and #0 V-lock sutures.  The   remaining wound was closed with 2-0 Vicryl and running 4-0 Monocryl.   The hip was  cleaned, dried, and dressed  sterilely using Dermabond and   Aquacel dressing.  He was then brought   to recovery room in stable condition tolerating the procedure well.    Molli Barrows, PA-C was present for the entirety of the case involved from   preoperative positioning, perioperative retractor management, general   facilitation of the case, as well as primary wound closure as assistant.            Pietro Cassis Alvan Dame, M.D.        10/16/2015 11:54 AM

## 2015-10-16 NOTE — Transfer of Care (Signed)
Immediate Anesthesia Transfer of Care Note  Patient: LEAMON WENNBERG  Procedure(s) Performed: Procedure(s): RIGHT TOTAL HIP ARTHROPLASTY ANTERIOR APPROACH (Right)  Patient Location: PACU  Anesthesia Type:Spinal  Level of Consciousness: awake, alert , oriented and patient cooperative  Airway & Oxygen Therapy: Patient Spontanous Breathing and Patient connected to face mask oxygen  Post-op Assessment: Report given to RN and Post -op Vital signs reviewed and stable  Post vital signs: stable  Last Vitals:  Vitals:   10/16/15 0726 10/16/15 1215  BP: 133/83 131/90  Pulse: 63 65  Resp: 16 17  Temp: 36.7 C     Last Pain:  Vitals:   10/16/15 0726  TempSrc: Oral  PainSc:       Patients Stated Pain Goal: 4 ([id number])  Complications: No apparent anesthesia complications

## 2015-10-16 NOTE — Discharge Instructions (Signed)

## 2015-10-16 NOTE — Interval H&P Note (Signed)
History and Physical Interval Note:  10/16/2015 8:54 AM  Terry Pacheco  has presented today for surgery, with the diagnosis of right hip osteoarthritis  The various methods of treatment have been discussed with the patient and family. After consideration of risks, benefits and other options for treatment, the patient has consented to  Procedure(s): RIGHT TOTAL HIP ARTHROPLASTY ANTERIOR APPROACH (Right) as a surgical intervention .  The patient's history has been reviewed, patient examined, no change in status, stable for surgery.  I have reviewed the patient's chart and labs.  Questions were answered to the patient's satisfaction.     Mauri Pole

## 2015-10-16 NOTE — Evaluation (Signed)
Physical Therapy Evaluation Patient Details Name: Terry Pacheco MRN: [id number] DOB: Jul 22, 1950 Today's Date: 10/16/2015   History of Present Illness  Pt s/p R DA THA with OA , GERD, HTN and obese  Clinical Impression  Pt is s/p R DA THA resulting in the deficits listed below (see PT Problem List).  Pt will benefit from skilled PT to increase their independence and safety with mobility to allow discharge to the venue listed below.       Follow Up Recommendations No PT follow up (per MD note)    Equipment Recommendations  None recommended by PT    Recommendations for Other Services       Precautions / Restrictions Precautions Precautions: None Restrictions Weight Bearing Restrictions: No      Mobility  Bed Mobility Overal bed mobility: Needs Assistance Bed Mobility: Supine to Sit;Sit to Supine     Supine to sit: Min assist     General bed mobility comments: cues for sequencing and min assist with upper body , used railing   Transfers Overall transfer level: Needs assistance Equipment used: Rolling walker (2 wheeled) Transfers: Sit to/from Stand Sit to Stand: Min assist         General transfer comment: cues for hand placement on RW for safety   Ambulation/Gait Ambulation/Gait assistance: Min guard Ambulation Distance (Feet): 30 Feet Assistive device: Rolling walker (2 wheeled) Gait Pattern/deviations: Step-to pattern     General Gait Details: tolerated really well , limited waling to make sure his pain did not increase   Stairs            Wheelchair Mobility    Modified Rankin (Stroke Patients Only)       Balance                                             Pertinent Vitals/Pain Pain Assessment: 0-10 Pain Score: 7  Pain Location: R hip area Pain Descriptors / Indicators: Aching;Tightness Pain Intervention(s): Limited activity within patient's tolerance;Monitored during session;Ice applied    Home Living  Family/patient expects to be discharged to:: Private residence Living Arrangements: Spouse/significant other Available Help at Discharge: Family (wife took time off work to be at home with pt) Type of Home: House Home Access: Stairs to enter Entrance Stairs-Rails: Right;Left (can use either side ) Entrance Stairs-Number of Steps: 4 Home Layout: One level;Laundry or work area in basement (pt ws not going to go to basement) Home Equipment: Environmental consultant - 2 wheels      Prior Function Level of Independence: Independent         Comments: just limited with pain prior to surgery      Hand Dominance        Extremity/Trunk Assessment               Lower Extremity Assessment: Overall WFL for tasks assessed;RLE deficits/detail RLE Deficits / Details: just limited with hip abduction and pt reports he had a lot of tightness prohibiting hip abduction prior to surgery        Communication   Communication: No difficulties  Cognition Arousal/Alertness: Awake/alert Behavior During Therapy: WFL for tasks assessed/performed Overall Cognitive Status: Within Functional Limits for tasks assessed                      General Comments      Exercises Total Joint  Exercises Ankle Circles/Pumps: AROM;Both;10 reps;Supine Quad Sets: AROM;Right;5 reps;Supine Heel Slides: AAROM;Right;5 reps;Supine Hip ABduction/ADduction: AAROM;Right;Supine;5 reps      Assessment/Plan    PT Assessment Patient needs continued PT services  PT Diagnosis Difficulty walking   PT Problem List Decreased strength;Decreased activity tolerance;Decreased knowledge of use of DME  PT Treatment Interventions DME instruction;Gait training;Stair training;Functional mobility training;Therapeutic exercise;Therapeutic activities;Patient/family education   PT Goals (Current goals can be found in the Care Plan section) Acute Rehab PT Goals Patient Stated Goal: to do what every you all say !!  PT Goal Formulation: With  patient Time For Goal Achievement: 10/30/15 Potential to Achieve Goals: Good    Frequency 7X/week   Barriers to discharge        Co-evaluation               End of Session Equipment Utilized During Treatment: Gait belt Activity Tolerance: Patient tolerated treatment well Patient left: in chair;with call bell/phone within reach;with chair alarm set Nurse Communication: Mobility status         Time: [id number] PT Time Calculation (min) (ACUTE ONLY): 24 min   Charges:   PT Evaluation $PT Eval Low Complexity: 1 Procedure PT Treatments $Gait Training: 8-22 mins   PT G CodesClide Dales 2015/11/11, 4:38 PM  Clide Dales, PT Pager: [phone] 11-11-2015

## 2015-10-16 NOTE — Anesthesia Procedure Notes (Addendum)
Spinal  Patient location during procedure: OR End time: 10/16/2015 10:35 AM Staffing Anesthesiologist: CARIGNAN, PETER Performed: anesthesiologist  Preanesthetic Checklist Completed: patient identified, site marked, surgical consent, pre-op evaluation, timeout performed, IV checked, risks and benefits discussed and monitors and equipment checked Spinal Block Patient position: sitting Prep: Betadine Patient monitoring: heart rate, continuous pulse ox and blood pressure Approach: right paramedian Location: L3-4 Injection technique: single-shot Needle Needle type: Sprotte  Needle gauge: 24 G Needle length: 9 cm Additional Notes Expiration date of kit checked and confirmed. Patient tolerated procedure well, without complications.       

## 2015-10-16 NOTE — Anesthesia Preprocedure Evaluation (Signed)
Anesthesia Evaluation  Patient identified by MRN, date of birth, ID band Patient awake    Reviewed: Allergy & Precautions, NPO status , Patient's Chart, lab work & pertinent test results  Airway Mallampati: II  TM Distance: >3 FB Neck ROM: Full    Dental no notable dental hx.    Pulmonary sleep apnea , Current Smoker,    Pulmonary exam normal breath sounds clear to auscultation       Cardiovascular hypertension, Pt. on medications Normal cardiovascular exam Rhythm:Regular Rate:Normal     Neuro/Psych negative neurological ROS  negative psych ROS   GI/Hepatic Neg liver ROS, GERD  Medicated and Controlled,  Endo/Other  negative endocrine ROS  Renal/GU negative Renal ROS  negative genitourinary   Musculoskeletal negative musculoskeletal ROS (+)   Abdominal   Peds negative pediatric ROS (+)  Hematology negative hematology ROS (+)   Anesthesia Other Findings   Reproductive/Obstetrics negative OB ROS                             Anesthesia Physical Anesthesia Plan  ASA: II  Anesthesia Plan: Spinal   Post-op Pain Management:    Induction:   Airway Management Planned: Simple Face Mask  Additional Equipment:   Intra-op Plan:   Post-operative Plan:   Informed Consent: I have reviewed the patients History and Physical, chart, labs and discussed the procedure including the risks, benefits and alternatives for the proposed anesthesia with the patient or authorized representative who has indicated his/her understanding and acceptance.   Dental advisory given  Plan Discussed with: CRNA  Anesthesia Plan Comments:         Anesthesia Quick Evaluation

## 2015-10-16 NOTE — Anesthesia Postprocedure Evaluation (Signed)
Anesthesia Post Note  Patient: Terry Pacheco  Procedure(s) Performed: Procedure(s) (LRB): RIGHT TOTAL HIP ARTHROPLASTY ANTERIOR APPROACH (Right)  Patient location during evaluation: PACU Anesthesia Type: Spinal Level of consciousness: awake and alert Pain management: pain level controlled Vital Signs Assessment: post-procedure vital signs reviewed and stable Respiratory status: spontaneous breathing and respiratory function stable Cardiovascular status: blood pressure returned to baseline and stable Postop Assessment: no headache, no backache and spinal receding Anesthetic complications: no    Last Vitals:  Vitals:   10/16/15 1430 10/16/15 1530  BP: 125/71 137/71  Pulse: (!) 52 (!) 57  Resp: 16 16  Temp: 36.5 C 36.6 C    Last Pain:  Vitals:   10/16/15 1530  TempSrc: Oral  PainSc:                  Montez Hageman

## 2015-10-17 LAB — CBC
HEMATOCRIT: 36 % — AB (ref 39.0–52.0)
HEMOGLOBIN: 12.4 g/dL — AB (ref 13.0–17.0)
MCH: 31.1 pg (ref 26.0–34.0)
MCHC: 34.4 g/dL (ref 30.0–36.0)
MCV: 90.2 fL (ref 78.0–100.0)
Platelets: 198 10*3/uL (ref 150–400)
RBC: 3.99 MIL/uL — ABNORMAL LOW (ref 4.22–5.81)
RDW: 12.9 % (ref 11.5–15.5)
WBC: 14 10*3/uL — AB (ref 4.0–10.5)

## 2015-10-17 LAB — BASIC METABOLIC PANEL
ANION GAP: 6 (ref 5–15)
BUN: 16 mg/dL (ref 6–20)
CHLORIDE: 106 mmol/L (ref 101–111)
CO2: 23 mmol/L (ref 22–32)
CREATININE: 0.83 mg/dL (ref 0.61–1.24)
Calcium: 8.6 mg/dL — ABNORMAL LOW (ref 8.9–10.3)
GFR calc non Af Amer: >60 mL/min (ref >60)
Glucose, Bld: 147 mg/dL — ABNORMAL HIGH (ref 65–99)
Potassium: 4.5 mmol/L (ref 3.5–5.1)
SODIUM: 135 mmol/L (ref 135–145)

## 2015-10-17 NOTE — Care Management Note (Signed)
Case Management Note  Patient Details  Name: Terry Pacheco MRN: [id number] Date of Birth: 05/07/1950  Subjective/Objective:                  RIGHT TOTAL HIP ARTHROPLASTY ANTERIOR APPROACH (Right) Action/Plan: Discharge planning Expected Discharge Date:  10/17/15               Expected Discharge Plan:  [place]  In-House Referral:     Discharge planning Services  CM Consult  Post Acute Care Choice:  Home Health Choice offered to:  Patient, Spouse  DME Arranged:  Walker rolling DME Agency:  [place]:  PT [place] Agency:  [facility] ([facility])  Status of Service:  Completed, signed off  If discussed at [employer], dates discussed:    Additional Comments: CM met with pt in room to offer choice of home health agency.  Pt chooses [facility] to render HHPT.  Referral called to [facility] rep, Tim.  CM called [facility] DME rep, Jermaine to please deliver the rolling walker to room prior to pt discharge.  No other CM needs were communicated. Dellie Catholic, RN 10/17/2015, 1:29 PM

## 2015-10-17 NOTE — Evaluation (Signed)
Occupational Therapy Evaluation Patient Details Name: Terry Pacheco MRN: [id number] DOB: 19-May-1950 Today's Date: 10/17/2015    History of Present Illness Pt s/p R DA THA with OA , GERD, HTN and obese   Clinical Impression   Patient evaluated by Occupational Therapy with no further acute OT needs identified. All education has been completed and the patient has no further questions. All education completed.  See below for any follow-up Occupational Therapy or equipment needs. OT is signing off. Thank you for this referral.      Follow Up Recommendations  No OT follow up;Supervision/Assistance - 24 hour    Equipment Recommendations  None recommended by OT    Recommendations for Other Services       Precautions / Restrictions Precautions Precautions: None      Mobility Bed Mobility Overal bed mobility: Needs Assistance Bed Mobility: Supine to Sit     Supine to sit: Supervision        Transfers Overall transfer level: Needs assistance Equipment used: Rolling walker (2 wheeled) Transfers: Sit to/from [employer] Sit to Stand: Min guard Stand pivot transfers: Min guard            Balance                                            ADL Overall ADL's : Needs assistance/impaired Eating/Feeding: Independent   Grooming: Wash/dry hands;Wash/dry face;Oral care;Brushing hair;Min guard;Standing   Upper Body Bathing: Set up;Sitting   Lower Body Bathing: Sit to/from stand;Minimal assistance Lower Body Bathing Details (indicate cue type and reason): Pt wtih difficulty accessing Rt foot  Upper Body Dressing : Set up;Sitting   Lower Body Dressing: Minimal assistance;Sit to/from stand Lower Body Dressing Details (indicate cue type and reason): Requires assist for donning sock and shoe.  instructed pt to thread Rt LE through pants first.  Wife will assist with socks and shoes.  Instructed him to try each day to improve flexibility and  ability to access foot  Toilet Transfer: Min guard;Ambulation;Comfort height toilet;Grab bars;RW Armed forces technical officer Details (indicate cue type and reason): demonstrates safe technique  Toileting- Clothing Manipulation and Hygiene: Min guard;Sit to/from stand   Tub/ Shower Transfer: Walk-in shower;Min guard;Ambulation;Shower Technical sales engineer Details (indicate cue type and reason): Pt demonstrates good safety awareness  Functional mobility during ADLs: Min guard;Rolling walker       Vision     Perception     Praxis      Pertinent Vitals/Pain Pain Assessment: 0-10 Pain Score: 3  Pain Location: Rt hip  Pain Descriptors / Indicators: Aching Pain Intervention(s): Monitored during session;Ice applied     Hand Dominance     Extremity/Trunk Assessment Upper Extremity Assessment Upper Extremity Assessment: Overall WFL for tasks assessed   Lower Extremity Assessment Lower Extremity Assessment: Defer to PT evaluation   Cervical / Trunk Assessment Cervical / Trunk Assessment: Normal   Communication Communication Communication: No difficulties   Cognition Arousal/Alertness: Awake/alert Behavior During Therapy: WFL for tasks assessed/performed Overall Cognitive Status: Within Functional Limits for tasks assessed                     General Comments       Exercises       Shoulder Instructions      Home Living Family/patient expects to be discharged to:: Private residence Living Arrangements: Spouse/significant other  Available Help at Discharge: Family Type of Home: House Home Access: Stairs to enter [employer] of Steps: 4 Entrance Stairs-Rails: Right;Left Home Layout: One level;Laundry or work area in basement     [employer] Shower/Tub: Occupational psychologist: [employer] Accessibility: Yes How Accessible: Accessible via walker Home Equipment: Environmental consultant - 2 wheels;Shower seat          Prior  Functioning/Environment Level of Independence: Independent             OT Diagnosis: Generalized weakness;Acute pain   OT Problem List:     OT Treatment/Interventions:      OT Goals(Current goals can be found in the care plan section) Acute Rehab OT Goals OT Goal Formulation: All assessment and education complete, DC therapy  OT Frequency:     Barriers to D/C:            Co-evaluation              End of Session Equipment Utilized During Treatment: Rolling walker Nurse Communication: Mobility status  Activity Tolerance: Patient tolerated treatment well Patient left: in chair;with call bell/phone within reach   Time: 0800-0818 OT Time Calculation (min): 18 min Charges:  OT General Charges $OT Visit: 1 Procedure OT Evaluation $OT Eval Low Complexity: 1 Procedure G-Codes:    Keiko Myricks M 10-31-2015, 8:51 AM

## 2015-10-17 NOTE — Progress Notes (Signed)
Physical Therapy Treatment Patient Details Name: Terry Pacheco MRN: [id number] DOB: 08/13/1950 Today's Date: 10/17/2015    History of Present Illness Pt s/p R DA THA with OA , GERD, HTN and obese    PT Comments    The patient is progressing well. plans Dc today  Follow Up Recommendations  No PT follow up (per chart)     Equipment Recommendations  None recommended by PT    Recommendations for Other Services       Precautions / Restrictions Precautions Precautions: None    Mobility  Bed Mobility   Bed Mobility: Supine to Sit;Sit to Supine     Supine to sit: Supervision Sit to supine: Supervision   General bed mobility comments: cues for use of sheet to self assist Rt leg onto the bed  Transfers   Equipment used: Rolling walker (2 wheeled) Transfers: Sit to/from Stand Sit to Stand: Supervision         General transfer comment: cues for hand placement on RW for safety   Ambulation/Gait Ambulation/Gait assistance: Supervision Ambulation Distance (Feet): 200 Feet Assistive device: Rolling walker (2 wheeled) Gait Pattern/deviations: Step-to pattern;Step-through pattern     General Gait Details: cues to step through   Stairs Stairs: Yes Stairs assistance: Min guard Stair Management: One rail Right;With cane Number of Stairs: 3 General stair comments: cues for sequqnce  Wheelchair Mobility    Modified Rankin (Stroke Patients Only)       Balance                                    Cognition Arousal/Alertness: Awake/alert                          Exercises Total Joint Exercises Ankle Circles/Pumps: AROM;Both;10 reps;Supine Quad Sets: AROM;Right;Supine;10 reps Short Arc Quad: AROM;Right;10 reps;Supine Heel Slides: AAROM;Right;Supine;10 reps Hip ABduction/ADduction: AAROM;Right;Supine;10 reps Long Arc Quad: AROM;Right;10 reps;Seated    General Comments        Pertinent Vitals/Pain Pain Score: 2  Pain Location: rt  hip Pain Intervention(s): Premedicated before session;Ice applied    Home Living                      Prior Function            PT Goals (current goals can now be found in the care plan section) Progress towards PT goals: Progressing toward goals    Frequency  7X/week    PT Plan Current plan remains appropriate    Co-evaluation             End of Session   Activity Tolerance: Patient tolerated treatment well Patient left: in chair;with call bell/phone within reach     Time: 1010-1048 PT Time Calculation (min) (ACUTE ONLY): 38 min  Charges:  $Gait Training: 8-22 mins $Therapeutic Exercise: 8-22 mins $Self Care/Home Management: Nov 07, 2022                    G Codes:      Claretha Cooper 10/17/2015, 1:05 PM

## 2015-10-17 NOTE — Progress Notes (Signed)
   Subjective: 1 Day Post-Op Procedure(s) (LRB): RIGHT TOTAL HIP ARTHROPLASTY ANTERIOR APPROACH (Right) Patient reports pain as mild.   Patient seen in rounds for Dr. Alvan Dame Patient is well, and has had no acute complaints or problems. Reports that he is "quite comfortable". No issues overnight. No SOB or chest pain.    Objective: Vital signs in last 24 hours: Temp:  [97.7 F (36.5 C)-98.6 F (37 C)] 98.6 F (37 C) (08/23 0548) Pulse Rate:  [52-65] 61 (08/23 0548) Resp:  [13-17] 16 (08/23 0548) BP: (118-137)/(67-90) 119/75 (08/23 0548) SpO2:  [95 %-100 %] 96 % (08/23 0548) Weight:  [90.3 kg (199 lb)] 90.3 kg (199 lb) (08/22 1325)  Intake/Output from previous day:  Intake/Output Summary (Last 24 hours) at 10/17/15 0817 Last data filed at 10/17/15 0720  Gross per 24 hour  Intake           4667.5 ml  Output             2700 ml  Net           1967.5 ml    Intake/Output this shift: Total I/O In: -  Out: 325 [Urine:325]  Labs:  Recent Labs  10/17/15 0413  HGB 12.4*    Recent Labs  10/17/15 0413  WBC 14.0*  RBC 3.99*  HCT 36.0*  PLT 198    Recent Labs  10/17/15 0413  NA 135  K 4.5  CL 106  CO2 23  BUN 16  CREATININE 0.83  GLUCOSE 147*  CALCIUM 8.6*    EXAM General - Patient is Alert and Oriented Extremity - Neurologically intact Intact pulses distally Dorsiflexion/Plantar flexion intact Compartment soft Dressing - dressing C/D/I Motor Function - intact, moving foot and toes well on exam.    Past Medical History:  Diagnosis Date  . Arthritis   . GERD (gastroesophageal reflux disease) 07/06/2014  . Hypertension   . Sleep apnea    does not use CPAP  . Wears glasses     Assessment/Plan: 1 Day Post-Op Procedure(s) (LRB): RIGHT TOTAL HIP ARTHROPLASTY ANTERIOR APPROACH (Right) Active Problems:   Status post total replacement of right hip  Estimated body mass index is 29.39 kg/m as calculated from the following:   Height as of this encounter:  5\' 9"  (1.753 m).   Weight as of this encounter: 90.3 kg (199 lb). Advance diet Up with therapy D/C IV fluids Discharge home  DVT Prophylaxis - Aspirin Weight Bearing As Tolerated    Will have the patient work with therapy this morning. Plan for DC home this afternoon.   Ardeen Jourdain, PA-C Orthopaedic Surgery 10/17/2015, 8:17 AM

## 2015-10-17 NOTE — Progress Notes (Signed)
CSW consulted for SNF placement. PN reviewed. PT does not recommend SNF placement at this time. RNCM will assist with d/c planning needs, if appropriate.   Werner Lean LCSW [phone]

## 2015-10-17 NOTE — Progress Notes (Signed)
Physical Therapy Treatment Patient Details Name: Terry Pacheco MRN: [id number] DOB: 05/18/1950 Today's Date: 10/17/2015    History of Present Illness Pt s/p R DA THA with OA , GERD, HTN and obese    PT Comments    The patient is progressing well.  ready for DC after receives a RW.  Follow Up Recommendations  Home health PT     Equipment Recommendations  Rolling walker with 5" wheels    Recommendations for Other Services       Precautions / Restrictions Precautions Precautions: None    Mobility  Bed Mobility   Bed Mobility: Supine to Sit;Sit to Supine     Supine to sit: Supervision Sit to supine: Supervision   General bed mobility comments: OOB  Transfers   Equipment used: Rolling walker (2 wheeled) Transfers: Sit to/from Stand Sit to Stand: Supervision         General transfer comment: cues for hand placement on RW for safety   Ambulation/Gait Ambulation/Gait assistance: Supervision Ambulation Distance (Feet): 200 Feet Assistive device: Rolling walker (2 wheeled) Gait Pattern/deviations: Step-through pattern     General Gait Details: cues to step through   Stairs Wheelchair Mobility    Modified Rankin (Stroke Patients Only)       Balance                                    Cognition Arousal/Alertness: Awake/alert                          Exercises    General Comments        Pertinent Vitals/Pain Pain Score: 2  Pain Location: rt hip Pain Descriptors / Indicators: Tightness;Sore Pain Intervention(s): Premedicated before session;Repositioned;Ice applied    Home Living                      Prior Function            PT Goals (current goals can now be found in the care plan section) Progress towards PT goals: Progressing toward goals    Frequency  7X/week    PT Plan Current plan remains appropriate;Discharge plan needs to be updated    Co-evaluation             End of Session    Activity Tolerance: Patient tolerated treatment well Patient left: with call bell/phone within reach;with family/visitor present     Time: [id number] PT Time Calculation (min) (ACUTE ONLY): 24 min  Charges:  Gait and ezxercise     G Codes:      Claretha Cooper 10/17/2015, 2:26 PM Tresa Endo PT [phone]

## 2015-10-18 NOTE — Discharge Summary (Signed)
Physician Discharge Summary   Patient ID: Terry Pacheco MRN: [id number] DOB/AGE: 02-25-1950 65 y.o.  Admit date: 10/16/2015 Discharge date: 10/17/2015  Primary Diagnosis: Primary osteoarthritis right hip   Admission Diagnoses:  Past Medical History:  Diagnosis Date  . Arthritis   . GERD (gastroesophageal reflux disease) 07/06/2014  . Hypertension   . Sleep apnea    does not use CPAP  . Wears glasses    Discharge Diagnoses:   Active Problems:   Status post total replacement of right hip  Estimated body mass index is 29.39 kg/m as calculated from the following:   Height as of this encounter: '5\' 9"'$  (1.753 m).   Weight as of this encounter: 90.3 kg (199 lb).  Procedure(s) (LRB): RIGHT TOTAL HIP ARTHROPLASTY ANTERIOR APPROACH (Right)   Consults: None  HPI: Terry Pacheco, 65 y.o. male, has a history of pain and functional disability in the right hip(s) due to arthritis and patient has failed non-surgical conservative treatments for greater than 12 weeks to include NSAID's and/or analgesics, corticosteriod injections and activity modification.  Onset of symptoms was gradual starting 1+ years ago with gradually worsening course since that time.The patient noted no past surgery on the right hip(s).  Patient currently rates pain in the right hip at 8 out of 10 with activity. Patient has night pain, worsening of pain with activity and weight bearing, trendelenberg gait, pain that interfers with activities of daily living and pain with passive range of motion. Patient has evidence of periarticular osteophytes and joint space narrowing by imaging studies. This condition presents safety issues increasing the risk of falls. There is no current active infection.  Risks, benefits and expectations were discussed with the patient.  Risks including but not limited to the risk of anesthesia, blood clots, nerve damage, blood vessel damage, failure of the prosthesis, infection and up to and including death.   Patient understand the risks, benefits and expectations and wishes to proceed with surgery.   Laboratory Data: Admission on 10/16/2015, Discharged on 10/17/2015  Component Date Value Ref Range Status  . WBC 10/17/2015 14.0* 4.0 - 10.5 K/uL Final  . RBC 10/17/2015 3.99* 4.22 - 5.81 MIL/uL Final  . Hemoglobin 10/17/2015 12.4* 13.0 - 17.0 g/dL Final  . HCT 10/17/2015 36.0* 39.0 - 52.0 % Final  . MCV 10/17/2015 90.2  78.0 - 100.0 fL Final  . MCH 10/17/2015 31.1  26.0 - 34.0 pg Final  . MCHC 10/17/2015 34.4  30.0 - 36.0 g/dL Final  . RDW 10/17/2015 12.9  11.5 - 15.5 % Final  . Platelets 10/17/2015 198  150 - 400 K/uL Final  . Sodium 10/17/2015 135  135 - 145 mmol/L Final  . Potassium 10/17/2015 4.5  3.5 - 5.1 mmol/L Final  . Chloride 10/17/2015 106  101 - 111 mmol/L Final  . CO2 10/17/2015 23  22 - 32 mmol/L Final  . Glucose, Bld 10/17/2015 147* 65 - 99 mg/dL Final  . BUN 10/17/2015 16  6 - 20 mg/dL Final  . Creatinine, Ser 10/17/2015 0.83  0.61 - 1.24 mg/dL Final  . Calcium 10/17/2015 8.6* 8.9 - 10.3 mg/dL Final  . GFR calc non Af Amer 10/17/2015 >60  >60 mL/min Final  . GFR calc Af Amer 10/17/2015 >60  >60 mL/min Final   Comment: (NOTE) The eGFR has been calculated using the CKD EPI equation. This calculation has not been validated in all clinical situations. eGFR's persistently <60 mL/min signify possible Chronic Kidney Disease.   . Anion gap  10/17/2015 6  5 - 15 Final  Hospital Outpatient Visit on 10/10/2015  Component Date Value Ref Range Status  . ABO/RH(D) 10/16/2015 A POS   Final  . Antibody Screen 10/16/2015 NEG   Final  . Sample Expiration 10/16/2015 10/19/2015   Final  . Extend sample reason 10/16/2015 NO TRANSFUSIONS OR PREGNANCY IN THE PAST 3 MONTHS   Final  . MRSA, PCR 10/10/2015 NEGATIVE  NEGATIVE Final  . Staphylococcus aureus 10/10/2015 NEGATIVE  NEGATIVE Final   Comment:        The Xpert SA Assay (FDA approved for NASAL specimens in patients over 21 years of  age), is one component of a comprehensive surveillance program.  Test performance has been validated by [facility] for patients greater than or equal to 67 year old. It is not intended to diagnose infection nor to guide or monitor treatment.   . WBC 10/10/2015 8.7  4.0 - 10.5 K/uL Final  . RBC 10/10/2015 4.96  4.22 - 5.81 MIL/uL Final  . Hemoglobin 10/10/2015 15.2  13.0 - 17.0 g/dL Final  . HCT 09/60/4540 44.6  39.0 - 52.0 % Final  . MCV 10/10/2015 89.9  78.0 - 100.0 fL Final  . MCH 10/10/2015 30.6  26.0 - 34.0 pg Final  . MCHC 10/10/2015 34.1  30.0 - 36.0 g/dL Final  . RDW 98/12/9145 13.2  11.5 - 15.5 % Final  . Platelets 10/10/2015 217  150 - 400 K/uL Final  . Sodium 10/10/2015 137  135 - 145 mmol/L Final  . Potassium 10/10/2015 4.1  3.5 - 5.1 mmol/L Final  . Chloride 10/10/2015 106  101 - 111 mmol/L Final  . CO2 10/10/2015 23  22 - 32 mmol/L Final  . Glucose, Bld 10/10/2015 105* 65 - 99 mg/dL Final  . BUN 82/95/6213 21* 6 - 20 mg/dL Final  . Creatinine, Ser 10/10/2015 1.04  0.61 - 1.24 mg/dL Final  . Calcium 08/65/7846 9.1  8.9 - 10.3 mg/dL Final  . GFR calc non Af Amer 10/10/2015 >60  >60 mL/min Final  . GFR calc Af Amer 10/10/2015 >60  >60 mL/min Final   Comment: (NOTE) The eGFR has been calculated using the CKD EPI equation. This calculation has not been validated in all clinical situations. eGFR's persistently <60 mL/min signify possible Chronic Kidney Disease.   . Anion gap 10/10/2015 8  5 - 15 Final  . Prothrombin Time 10/10/2015 12.7  11.4 - 15.2 seconds Final  . INR 10/10/2015 0.95   Final  . ABO/RH(D) 10/10/2015 A POS   Final     X-Rays:Dg Hip Port Unilat With Pelvis 1v Right  Result Date: 10/16/2015 CLINICAL DATA:  Postop EXAM: DG HIP (WITH OR WITHOUT PELVIS) 1V PORT RIGHT COMPARISON:  01/30/2015 FINDINGS: Right total hip arthroplasty has been performed. Anatomic alignment of the osseous and prostatic structures. No breakage or loosening of the hardware.  IMPRESSION: Right total hip arthroplasty anatomically aligned. Electronically Signed   By: Jolaine Click M.D.   On: 10/16/2015 13:01    EKG: Orders placed or performed in visit on 08/03/15  . EKG 12-Lead     Hospital Course: Patient was admitted to [facility] and taken to the OR and underwent the above state procedure without complications.  Patient tolerated the procedure well and was later transferred to the recovery room and then to the orthopaedic floor for postoperative care.  They were given PO and IV analgesics for pain control following their surgery.  They were given 24 hours  of postoperative antibiotics of  Anti-infectives    Start     Dose/Rate Route Frequency Ordered Stop   10/16/15 1700  ceFAZolin (ANCEF) IVPB 1 g/50 mL premix     1 g 100 mL/hr over 30 Minutes Intravenous Every 6 hours 10/16/15 1326 10/17/15 0029   10/16/15 0701  ceFAZolin (ANCEF) IVPB 2g/100 mL premix     2 g 200 mL/hr over 30 Minutes Intravenous On call to O.R. 10/16/15 0701 10/16/15 1037     and started on DVT prophylaxis in the form of Aspirin.   PT and OT were ordered for total hip protocol.  The patient was allowed to be WBAT with therapy. Discharge planning was consulted to help with postop disposition and equipment needs.  Patient had a fair night on the evening of surgery.  They started to get up OOB with therapy on day one. The patient had progressed with therapy and meeting their goals.  Incision was healing well.  Patient was seen in rounds and was ready to go home.   Diet: Cardiac diet Activity:WBAT Follow-up:in 2 weeks Disposition - Home Discharged Condition: stable   Discharge Instructions    Call MD / Call 911    Complete by:  As directed   If you experience chest pain or shortness of breath, CALL 911 and be transported to the hospital emergency room.  If you develope a fever above 101 F, pus (white drainage) or increased drainage or redness at the wound, or calf pain, call your  surgeon's office.   Constipation Prevention    Complete by:  As directed   Drink plenty of fluids.  Prune juice may be helpful.  You may use a stool softener, such as Colace (over the counter) 100 mg twice a day.  Use MiraLax (over the counter) for constipation as needed.   Diet - low sodium heart healthy    Complete by:  As directed   Discharge instructions    Complete by:  As directed   INSTRUCTIONS AFTER JOINT REPLACEMENT   Remove items at home which could result in a fall. This includes throw rugs or furniture in walking pathways ICE to the affected joint every three hours while awake for 30 minutes at a time, for at least the first 3-5 days, and then as needed for pain and swelling.  Continue to use ice for pain and swelling. You may notice swelling that will progress down to the foot and ankle.  This is normal after surgery.  Elevate your leg when you are not up walking on it.   Continue to use the breathing machine you got in the hospital (incentive spirometer) which will help keep your temperature down.  It is common for your temperature to cycle up and down following surgery, especially at night when you are not up moving around and exerting yourself.  The breathing machine keeps your lungs expanded and your temperature down.   DIET:  As you were doing prior to hospitalization, we recommend a well-balanced diet.  DRESSING / WOUND CARE / SHOWERING  Keep the surgical dressing until follow up.  The dressing is water proof, so you can shower without any extra covering.  IF THE DRESSING FALLS OFF or the wound gets wet inside, change the dressing with sterile gauze.  Please use good hand washing techniques before changing the dressing.  Do not use any lotions or creams on the incision until instructed by your surgeon.    ACTIVITY  Increase activity slowly as  tolerated, but follow the weight bearing instructions below.   No driving for 6 weeks or until further direction given by your  physician.  You cannot drive while taking narcotics.  No lifting or carrying greater than 10 lbs. until further directed by your surgeon. Avoid periods of inactivity such as sitting longer than an hour when not asleep. This helps prevent blood clots.  You may return to work once you are authorized by your doctor.     WEIGHT BEARING   Weight bearing as tolerated with assist device (walker, cane, etc) as directed, use it as long as suggested by your surgeon or therapist, typically at least 4-6 weeks.   EXERCISES  Results after joint replacement surgery are often greatly improved when you follow the exercise, range of motion and muscle strengthening exercises prescribed by your doctor. Safety measures are also important to protect the joint from further injury. Any time any of these exercises cause you to have increased pain or swelling, decrease what you are doing until you are comfortable again and then slowly increase them. If you have problems or questions, call your caregiver or physical therapist for advice.   Rehabilitation is important following a joint replacement. After just a few days of immobilization, the muscles of the leg can become weakened and shrink (atrophy).  These exercises are designed to build up the tone and strength of the thigh and leg muscles and to improve motion. Often times heat used for twenty to thirty minutes before working out will loosen up your tissues and help with improving the range of motion but do not use heat for the first two weeks following surgery (sometimes heat can increase post-operative swelling).   These exercises can be done on a training (exercise) mat, on the floor, on a table or on a bed. Use whatever works the best and is most comfortable for you.    Use music or television while you are exercising so that the exercises are a pleasant break in your day. This will make your life better with the exercises acting as a break in your routine that you  can look forward to.   Perform all exercises about fifteen times, three times per day or as directed.  You should exercise both the operative leg and the other leg as well.  Exercises include:   Quad Sets - Tighten up the muscle on the front of the thigh (Quad) and hold for 5-10 seconds.   Straight Leg Raises - With your knee straight (if you were given a brace, keep it on), lift the leg to 60 degrees, hold for 3 seconds, and slowly lower the leg.  Perform this exercise against resistance later as your leg gets stronger.  Leg Slides: Lying on your back, slowly slide your foot toward your buttocks, bending your knee up off the floor (only go as far as is comfortable). Then slowly slide your foot back down until your leg is flat on the floor again.  Angel Wings: Lying on your back spread your legs to the side as far apart as you can without causing discomfort.  Hamstring Strength:  Lying on your back, push your heel against the floor with your leg straight by tightening up the muscles of your buttocks.  Repeat, but this time bend your knee to a comfortable angle, and push your heel against the floor.  You may put a pillow under the heel to make it more comfortable if necessary.   A rehabilitation program following  joint replacement surgery can speed recovery and prevent re-injury in the future due to weakened muscles. Contact your doctor or a physical therapist for more information on knee rehabilitation.    CONSTIPATION  Constipation is defined medically as fewer than three stools per week and severe constipation as less than one stool per week.  Even if you have a regular bowel pattern at home, your normal regimen is likely to be disrupted due to multiple reasons following surgery.  Combination of anesthesia, postoperative narcotics, change in appetite and fluid intake all can affect your bowels.   YOU MUST use at least one of the following options; they are listed in order of increasing strength to  get the job done.  They are all available over the counter, and you may need to use some, POSSIBLY even all of these options:    Drink plenty of fluids (prune juice may be helpful) and high fiber foods Colace 100 mg by mouth twice a day  Senokot for constipation as directed and as needed Dulcolax (bisacodyl), take with full glass of water  Miralax (polyethylene glycol) once or twice a day as needed.  If you have tried all these things and are unable to have a bowel movement in the first 3-4 days after surgery call either your surgeon or your primary doctor.    If you experience loose stools or diarrhea, hold the medications until you stool forms back up.  If your symptoms do not get better within 1 week or if they get worse, check with your doctor.  If you experience "the worst abdominal pain ever" or develop nausea or vomiting, please contact the office immediately for further recommendations for treatment.   ITCHING:  If you experience itching with your medications, try taking only a single pain pill, or even half a pain pill at a time.  You can also use Benadryl over the counter for itching or also to help with sleep.   TED HOSE STOCKINGS:  Use stockings on both legs until for at least 2 weeks or as directed by physician office. They may be removed at night for sleeping.  MEDICATIONS:  See your medication summary on the "After Visit Summary" that nursing will review with you.  You may have some home medications which will be placed on hold until you complete the course of blood thinner medication.  It is important for you to complete the blood thinner medication as prescribed.  PRECAUTIONS:  If you experience chest pain or shortness of breath - call 911 immediately for transfer to the hospital emergency department.   If you develop a fever greater that 101 F, purulent drainage from wound, increased redness or drainage from wound, foul odor from the wound/dressing, or calf pain - CONTACT YOUR  SURGEON.                                                   FOLLOW-UP APPOINTMENTS:  If you do not already have a post-op appointment, please call the office for an appointment to be seen by your surgeon.  Guidelines for how soon to be seen are listed in your "After Visit Summary", but are typically between 1-4 weeks after surgery.   MAKE SURE YOU:  Understand these instructions.  Get help right away if you are not doing well or get worse.  Thank you for letting us be a part of your medical care team.  It is a privilege we respect greatly.  We hope these instructions will help you stay on track for a fast and full recovery!   Increase activity slowly as tolerated    Complete by:  As directed       Medication List    STOP taking these medications   ibuprofen 200 MG tablet Commonly known as:  ADVIL,MOTRIN   meloxicam 15 MG tablet Commonly known as:  MOBIC     TAKE these medications   amLODipine 10 MG tablet Commonly known as:  NORVASC TAKE 1 TABLET (10 MG TOTAL) BY MOUTH DAILY. FOR BLOOD PRESSURE What changed:  Another medication with the same name was removed. Continue taking this medication, and follow the directions you see here.   aspirin 81 MG chewable tablet Chew 1 tablet (81 mg total) by mouth 2 (two) times daily.   atorvastatin 40 MG tablet Commonly known as:  LIPITOR Take 1 tablet (40 mg total) by mouth daily.   escitalopram 20 MG tablet Commonly known as:  LEXAPRO Take 0.5 tablets (10 mg total) by mouth daily.   HYDROcodone-acetaminophen 7.5-325 MG tablet Commonly known as:  NORCO Take 1-2 tablets by mouth every 4 (four) hours as needed (breakthrough pain).   methocarbamol 500 MG tablet Commonly known as:  ROBAXIN Take 1 tablet (500 mg total) by mouth every 6 (six) hours as needed for muscle spasms.   omeprazole 20 MG capsule Commonly known as:  PRILOSEC TAKE 1 CAPSULE (20 MG TOTAL) BY MOUTH DAILY.      Follow-up Information    Mauri Pole, MD.  Schedule an appointment as soon as possible for a visit in 2 week(s).   Specialty:  Orthopedic Surgery Contact information: [address] [place] [address] [phone]        [facility] .   Why:  now called [facility]; will provide your home health physical therapy Contact information: [place] [address] [address] [address] [phone]        [facility] - [facility] .   Why:  rolling walker Contact information: [place] [address] [phone]           Signed: Ardeen Jourdain, PA-C Orthopaedic Surgery 10/18/2015, 11:19 AM

## 2015-10-19 DIAGNOSIS — F411 Generalized anxiety disorder: Secondary | ICD-10-CM | POA: Diagnosis not present

## 2015-10-19 DIAGNOSIS — K219 Gastro-esophageal reflux disease without esophagitis: Secondary | ICD-10-CM | POA: Diagnosis not present

## 2015-10-19 DIAGNOSIS — I10 Essential (primary) hypertension: Secondary | ICD-10-CM | POA: Diagnosis not present

## 2015-10-19 DIAGNOSIS — M1991 Primary osteoarthritis, unspecified site: Secondary | ICD-10-CM | POA: Diagnosis not present

## 2015-10-19 DIAGNOSIS — G4730 Sleep apnea, unspecified: Secondary | ICD-10-CM | POA: Diagnosis not present

## 2015-10-19 DIAGNOSIS — Z471 Aftercare following joint replacement surgery: Secondary | ICD-10-CM | POA: Diagnosis not present

## 2015-10-21 ENCOUNTER — Other Ambulatory Visit: Payer: Self-pay | Admitting: Family Medicine

## 2015-10-24 DIAGNOSIS — K219 Gastro-esophageal reflux disease without esophagitis: Secondary | ICD-10-CM | POA: Diagnosis not present

## 2015-10-24 DIAGNOSIS — G4730 Sleep apnea, unspecified: Secondary | ICD-10-CM | POA: Diagnosis not present

## 2015-10-24 DIAGNOSIS — Z471 Aftercare following joint replacement surgery: Secondary | ICD-10-CM | POA: Diagnosis not present

## 2015-10-24 DIAGNOSIS — I10 Essential (primary) hypertension: Secondary | ICD-10-CM | POA: Diagnosis not present

## 2015-10-24 DIAGNOSIS — M1991 Primary osteoarthritis, unspecified site: Secondary | ICD-10-CM | POA: Diagnosis not present

## 2015-10-24 DIAGNOSIS — F411 Generalized anxiety disorder: Secondary | ICD-10-CM | POA: Diagnosis not present

## 2015-10-26 DIAGNOSIS — M1991 Primary osteoarthritis, unspecified site: Secondary | ICD-10-CM | POA: Diagnosis not present

## 2015-10-26 DIAGNOSIS — I10 Essential (primary) hypertension: Secondary | ICD-10-CM | POA: Diagnosis not present

## 2015-10-26 DIAGNOSIS — K219 Gastro-esophageal reflux disease without esophagitis: Secondary | ICD-10-CM | POA: Diagnosis not present

## 2015-10-26 DIAGNOSIS — G4730 Sleep apnea, unspecified: Secondary | ICD-10-CM | POA: Diagnosis not present

## 2015-10-26 DIAGNOSIS — F411 Generalized anxiety disorder: Secondary | ICD-10-CM | POA: Diagnosis not present

## 2015-10-26 DIAGNOSIS — Z471 Aftercare following joint replacement surgery: Secondary | ICD-10-CM | POA: Diagnosis not present

## 2015-10-30 DIAGNOSIS — F411 Generalized anxiety disorder: Secondary | ICD-10-CM | POA: Diagnosis not present

## 2015-10-30 DIAGNOSIS — G4730 Sleep apnea, unspecified: Secondary | ICD-10-CM | POA: Diagnosis not present

## 2015-10-30 DIAGNOSIS — M1991 Primary osteoarthritis, unspecified site: Secondary | ICD-10-CM | POA: Diagnosis not present

## 2015-10-30 DIAGNOSIS — K219 Gastro-esophageal reflux disease without esophagitis: Secondary | ICD-10-CM | POA: Diagnosis not present

## 2015-10-30 DIAGNOSIS — Z471 Aftercare following joint replacement surgery: Secondary | ICD-10-CM | POA: Diagnosis not present

## 2015-10-30 DIAGNOSIS — I10 Essential (primary) hypertension: Secondary | ICD-10-CM | POA: Diagnosis not present

## 2015-11-01 ENCOUNTER — Other Ambulatory Visit: Payer: Self-pay | Admitting: Family Medicine

## 2015-11-01 DIAGNOSIS — M1991 Primary osteoarthritis, unspecified site: Secondary | ICD-10-CM | POA: Diagnosis not present

## 2015-11-01 DIAGNOSIS — G4730 Sleep apnea, unspecified: Secondary | ICD-10-CM | POA: Diagnosis not present

## 2015-11-01 DIAGNOSIS — I10 Essential (primary) hypertension: Secondary | ICD-10-CM | POA: Diagnosis not present

## 2015-11-01 DIAGNOSIS — Z471 Aftercare following joint replacement surgery: Secondary | ICD-10-CM | POA: Diagnosis not present

## 2015-11-01 DIAGNOSIS — F411 Generalized anxiety disorder: Secondary | ICD-10-CM | POA: Diagnosis not present

## 2015-11-01 DIAGNOSIS — K219 Gastro-esophageal reflux disease without esophagitis: Secondary | ICD-10-CM | POA: Diagnosis not present

## 2015-12-01 ENCOUNTER — Other Ambulatory Visit: Payer: Self-pay | Admitting: Family Medicine

## 2015-12-19 ENCOUNTER — Encounter: Payer: Self-pay | Admitting: Family Medicine

## 2015-12-19 ENCOUNTER — Ambulatory Visit (INDEPENDENT_AMBULATORY_CARE_PROVIDER_SITE_OTHER): Payer: 59 | Admitting: Family Medicine

## 2015-12-19 VITALS — BP 120/75 | HR 3 | Temp 98.0°F | Ht 69.0 in | Wt 195.5 lb

## 2015-12-19 DIAGNOSIS — Z23 Encounter for immunization: Secondary | ICD-10-CM | POA: Diagnosis not present

## 2015-12-19 DIAGNOSIS — K146 Glossodynia: Secondary | ICD-10-CM | POA: Diagnosis not present

## 2015-12-19 DIAGNOSIS — J0110 Acute frontal sinusitis, unspecified: Secondary | ICD-10-CM | POA: Diagnosis not present

## 2015-12-19 MED ORDER — MAGIC MOUTHWASH W/LIDOCAINE: 5.0000 mL | Freq: Four times a day (QID) | ORAL | 2 refills | Status: DC | PRN

## 2015-12-19 MED ORDER — VITAMIN B-12 5000 MCG PO LOZG: 5000.0000 ug | LOZENGE | Freq: Every day | ORAL | 5 refills | Status: DC

## 2015-12-19 MED ORDER — AMOXICILLIN-POT CLAVULANATE 875-125 MG PO TABS: 1.0000 | ORAL_TABLET | Freq: Two times a day (BID) | ORAL | 0 refills | Status: DC

## 2015-12-19 MED ORDER — ZINC 40 MG PO TABS: 40.0000 mg | ORAL_TABLET | Freq: Every day | ORAL | 0 refills | Status: AC

## 2015-12-19 NOTE — Progress Notes (Signed)
Subjective:  Patient ID: Terry Pacheco, male    DOB: [birth year]  Age: 65 y.o. MRN: [id number]  CC: tongue numbness (pt here today c/o tongue pain and numbness only on the right side for 2 weeks )   HPI Terry Pacheco presents for soreness, burning and numbness at a spot on the lateral aspect of the tongue on the right side. Ongoing for 2 weeks. Stareted with sinus HA, ST, posterior drainage. Sinus pressure at the frontal area to the left. Still having ST & burning. History Terry Pacheco has a past medical history of Arthritis; GERD (gastroesophageal reflux disease) (07/06/2014); Hypertension; Sleep apnea; and Wears glasses.   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   His family history includes Heart disease in his mother; Stroke in his father.He reports that he has quit smoking. His smoking use included Cigarettes. He started smoking about 32 years ago. He has a 22.50 pack-year smoking history. He has never used smokeless tobacco. He reports that he drinks alcohol. He reports that he uses drugs, including Marijuana.    ROS Review of Systems  Constitutional: Negative for chills, diaphoresis and fever.  HENT: Negative for rhinorrhea and sore throat.   Respiratory: Negative for cough and shortness of breath.   Cardiovascular: Negative for chest pain.  Gastrointestinal: Negative for abdominal pain.  Musculoskeletal: Negative for arthralgias and myalgias.  Skin: Negative for rash.  Neurological: Negative for weakness and headaches.    Objective:  BP 120/75   Pulse (!) 3   Temp 98 F (36.7 C) (Oral)   Ht 5\' 9"  (1.753 m)   Wt 195 lb 8 oz (88.7 kg)   BMI 28.87 kg/m   BP Readings from Last 3 Encounters:  12/19/15 120/75  10/17/15 126/72  10/10/15 (!) 142/87    Wt Readings from Last 3 Encounters:  12/19/15 195 lb 8 oz (88.7 kg)  10/16/15 199 lb (90.3 kg)  10/10/15 199 lb 4 oz (90.4 kg)     Physical  Exam  Constitutional: He is oriented to person, place, and time. He appears well-developed and well-nourished.  HENT:  Head: Normocephalic and atraumatic.  Right Ear: External ear normal.  Left Ear: External ear normal.  Mouth/Throat: No oropharyngeal exudate or posterior oropharyngeal erythema.  Eyes: Pupils are equal, round, and reactive to light.  Neck: Normal range of motion. Neck supple.  Cardiovascular: Normal rate and regular rhythm.   No murmur heard. Pulmonary/Chest: Breath sounds normal. No respiratory distress.  Neurological: He is alert and oriented to person, place, and time.  Vitals reviewed.    Lab Results  Component Value Date   WBC 14.0 (H) 10/17/2015   HGB 12.4 (L) 10/17/2015   HCT 36.0 (L) 10/17/2015   PLT 198 10/17/2015   GLUCOSE 147 (H) 10/17/2015   CHOL 138 08/03/2015   TRIG 101 08/03/2015   HDL 35 (L) 08/03/2015   LDLCALC 83 08/03/2015   ALT 16 08/03/2015   AST 20 08/03/2015   NA 135 10/17/2015   K 4.5 10/17/2015   CL 106 10/17/2015   CREATININE 0.83 10/17/2015   BUN 16 10/17/2015   CO2 23 10/17/2015   TSH 0.985 02/08/2013   INR 0.95 10/10/2015   HGBA1C 5.2 02/08/2013    No results found.  Assessment & Plan:   Terry Pacheco was seen today for tongue numbness.  Diagnoses and all orders for this visit:  Glossodynia  Acute frontal sinusitis, recurrence not  specified  Encounter for immunization -     Flu Vaccine QUAD 36+ mos IM  Other orders -     amoxicillin-clavulanate (AUGMENTIN) 875-125 MG tablet; Take 1 tablet by mouth 2 (two) times daily. Take all of this medication -     magic mouthwash w/lidocaine SOLN; Take 5 mLs by mouth 4 (four) times daily as needed for mouth pain. -     Zinc 40 MG TABS; Take 1 tablet (40 mg total) by mouth daily. -     Cyanocobalamin (VITAMIN B-12) 5000 MCG LOZG; Take 5,000 mcg by mouth daily.    I have discontinued Terry Pacheco HYDROcodone-acetaminophen, aspirin, and methocarbamol. I am also having him start on  amoxicillin-clavulanate, magic mouthwash w/lidocaine, Zinc, and Vitamin B-12. Additionally, I am having him maintain his atorvastatin, escitalopram, omeprazole, amLODipine, and meloxicam.  Meds ordered this encounter  Medications  . amoxicillin-clavulanate (AUGMENTIN) 875-125 MG tablet    Sig: Take 1 tablet by mouth 2 (two) times daily. Take all of this medication    Dispense:  20 tablet    Refill:  0  . magic mouthwash w/lidocaine SOLN    Sig: Take 5 mLs by mouth 4 (four) times daily as needed for mouth pain.    Dispense:  240 mL    Refill:  2  . Zinc 40 MG TABS    Sig: Take 1 tablet (40 mg total) by mouth daily.    Dispense:  30 tablet    Refill:  0  . Cyanocobalamin (VITAMIN B-12) 5000 MCG LOZG    Sig: Take 5,000 mcg by mouth daily.    Dispense:  30 lozenge    Refill:  5     Follow-up: Return in about 6 weeks (around 01/30/2016), or if symptoms worsen or fail to improve, for hypertension.  Claretta Fraise, M.D.

## 2016-01-16 ENCOUNTER — Other Ambulatory Visit: Payer: Self-pay | Admitting: Family Medicine

## 2016-01-17 ENCOUNTER — Other Ambulatory Visit: Payer: Self-pay | Admitting: Family Medicine

## 2016-02-07 ENCOUNTER — Other Ambulatory Visit: Payer: Self-pay | Admitting: Family Medicine

## 2016-02-21 ENCOUNTER — Other Ambulatory Visit: Payer: Self-pay | Admitting: Family Medicine

## 2016-03-14 ENCOUNTER — Other Ambulatory Visit: Payer: Self-pay | Admitting: Family Medicine

## 2016-03-20 ENCOUNTER — Other Ambulatory Visit: Payer: Self-pay | Admitting: Family Medicine

## 2016-04-14 ENCOUNTER — Other Ambulatory Visit: Payer: Self-pay | Admitting: Family Medicine

## 2016-04-16 ENCOUNTER — Encounter: Payer: Self-pay | Admitting: Family Medicine

## 2016-04-16 ENCOUNTER — Ambulatory Visit (INDEPENDENT_AMBULATORY_CARE_PROVIDER_SITE_OTHER): Payer: 59 | Admitting: Family Medicine

## 2016-04-16 ENCOUNTER — Ambulatory Visit (INDEPENDENT_AMBULATORY_CARE_PROVIDER_SITE_OTHER): Payer: 59

## 2016-04-16 VITALS — BP 127/77 | HR 68 | Temp 97.9°F | Ht 69.0 in | Wt 204.0 lb

## 2016-04-16 DIAGNOSIS — M25521 Pain in right elbow: Secondary | ICD-10-CM

## 2016-04-16 DIAGNOSIS — K146 Glossodynia: Secondary | ICD-10-CM | POA: Diagnosis not present

## 2016-04-16 NOTE — Progress Notes (Signed)
Please contact the patient regarding:elbow XR nml

## 2016-04-16 NOTE — Progress Notes (Signed)
Subjective:  Patient ID: Terry Pacheco, male    DOB: 1950-10-11  Age: 66 y.o. MRN: [id number]  CC: Oral Pain (pt here today c/o tongue numbness and also feels like it swells at times)   HPI Terry Pacheco presents for pain in right TMJ also. Numbness worst at right front of tongue. Didn't respond did not respond to a number of dental procedures. Pt. States taste sensaation intact for sweet, salt, bitter and sour.The hygienist had him using Biotene for dry mouth which didn't help. The zinc didn't help B12 didn't help.   History Terry Pacheco has a past medical history of Arthritis; GERD (gastroesophageal reflux disease) (07/06/2014); Hypertension; Sleep apnea; and Wears glasses.   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   His family history includes Heart disease in his mother; Stroke in his father.Terry Pacheco reports that Terry Pacheco has quit smoking. His smoking use included Cigarettes. Terry Pacheco started smoking about 33 years ago. Terry Pacheco has a 22.50 pack-year smoking history. Terry Pacheco has never used smokeless tobacco. Terry Pacheco reports that Terry Pacheco drinks alcohol. Terry Pacheco reports that Terry Pacheco uses drugs, including Marijuana.    ROS Review of Systems  Constitutional: Negative for chills, diaphoresis and fever.  HENT: Negative for rhinorrhea and sore throat.   Respiratory: Negative for cough and shortness of breath.   Cardiovascular: Negative for chest pain.  Gastrointestinal: Negative for abdominal pain.  Musculoskeletal: Negative for arthralgias and myalgias.  Skin: Negative for rash.  Neurological: Negative for weakness and headaches.    Objective:  BP 127/77   Pulse 68   Temp 97.9 F (36.6 C) (Oral)   Ht '5\' 9"'$  (1.753 m)   Wt 204 lb (92.5 kg)   BMI 30.13 kg/m   BP Readings from Last 3 Encounters:  04/16/16 127/77  12/19/15 120/75  10/17/15 126/72    Wt Readings from Last 3 Encounters:  04/16/16 204 lb (92.5 kg)  12/19/15 195 lb 8 oz (88.7 kg)    10/16/15 199 lb (90.3 kg)     Physical Exam  Constitutional: Terry Pacheco is oriented to person, place, and time. Terry Pacheco appears well-developed and well-nourished.  HENT:  Head: Normocephalic and atraumatic.  Right Ear: Tympanic membrane and external ear normal. No decreased hearing is noted.  Left Ear: Tympanic membrane and external ear normal. No decreased hearing is noted.  Nose: No mucosal edema. Right sinus exhibits no frontal sinus tenderness. Left sinus exhibits no frontal sinus tenderness.  Mouth/Throat: No oropharyngeal exudate or posterior oropharyngeal erythema.  Tender at right TMJ  Neck: No Brudzinski's sign noted.  Pulmonary/Chest: Breath sounds normal. No respiratory distress.  Lymphadenopathy:       Head (right side): No preauricular adenopathy present.       Head (left side): No preauricular adenopathy present.       Right cervical: No superficial cervical adenopathy present.      Left cervical: No superficial cervical adenopathy present.  Neurological: Terry Pacheco is alert and oriented to person, place, and time.    No results found.  Assessment & Plan:   Keghan was seen today for oral pain.  Diagnoses and all orders for this visit:  Glossodynia -     CBC with Differential/Platelet -     CMP14+EGFR -     Folate -     Vitamin B12 -     Ambulatory referral to ENT  Right elbow pain -     DG Elbow 2 Views Right;  Future    I have discontinued Mr. Hillmer amoxicillin-clavulanate and magic mouthwash w/lidocaine. I am also having him maintain his meloxicam, Vitamin B-12, omeprazole, escitalopram, atorvastatin, and amLODipine.  Allergies as of 04/16/2016      Reactions   Crestor [rosuvastatin]    Muscle aches      Medication List       Accurate as of 04/16/16 11:59 PM. Always use your most recent med list.          amLODipine 10 MG tablet Commonly known as:  NORVASC TAKE 1 TABLET EVERY DAY FOR FOR BLOOD PRESSURE   atorvastatin 40 MG tablet Commonly known as:   LIPITOR TAKE 1 TABLET (40 MG TOTAL) BY MOUTH DAILY.   escitalopram 20 MG tablet Commonly known as:  LEXAPRO TAKE 0.5 TABLETS (10 MG TOTAL) BY MOUTH DAILY.   meloxicam 15 MG tablet Commonly known as:  MOBIC TAKE 1 TABLET (15 MG TOTAL) BY MOUTH DAILY. FOR JOINT AND MUSCLE PAIN   omeprazole 20 MG capsule Commonly known as:  PRILOSEC TAKE 1 CAPSULE (20 MG TOTAL) BY MOUTH DAILY.   Vitamin B-12 5000 MCG Lozg Take 5,000 mcg by mouth daily.        Follow-up: No Follow-up on file.  Claretta Fraise, M.D.

## 2016-04-17 LAB — CMP14+EGFR
ALBUMIN: 4.3 g/dL (ref 3.6–4.8)
ALK PHOS: 154 IU/L — AB (ref 39–117)
ALT: 23 IU/L (ref 0–44)
AST: 21 IU/L (ref 0–40)
Albumin/Globulin Ratio: 1.6 (ref 1.2–2.2)
BUN / CREAT RATIO: 15 (ref 10–24)
BUN: 15 mg/dL (ref 8–27)
Bilirubin Total: 0.6 mg/dL (ref 0.0–1.2)
CALCIUM: 9.4 mg/dL (ref 8.6–10.2)
CO2: 23 mmol/L (ref 18–29)
CREATININE: 1.01 mg/dL (ref 0.76–1.27)
Chloride: 99 mmol/L (ref 96–106)
GFR, EST AFRICAN AMERICAN: 90 (ref >59)
GFR, EST NON AFRICAN AMERICAN: 78 (ref >59)
GLOBULIN, TOTAL: 2.7 (ref 1.5–4.5)
Glucose: 93 mg/dL (ref 65–99)
Potassium: 4.6 mmol/L (ref 3.5–5.2)
SODIUM: 138 mmol/L (ref 134–144)
TOTAL PROTEIN: 7 g/dL (ref 6.0–8.5)

## 2016-04-17 LAB — CBC WITH DIFFERENTIAL/PLATELET
Basophils Absolute: 0 10*3/uL (ref 0.0–0.2)
Basos: 0 %
EOS (ABSOLUTE): 0.5 10*3/uL — ABNORMAL HIGH (ref 0.0–0.4)
EOS: 7 %
HEMATOCRIT: 44.9 % (ref 37.5–51.0)
HEMOGLOBIN: 15.2 g/dL (ref 13.0–17.7)
IMMATURE GRANS (ABS): 0 10*3/uL (ref 0.0–0.1)
Immature Granulocytes: 0 %
LYMPHS: 25 %
Lymphocytes Absolute: 1.9 10*3/uL (ref 0.7–3.1)
MCH: 29.9 pg (ref 26.6–33.0)
MCHC: 33.9 g/dL (ref 31.5–35.7)
MCV: 88 fL (ref 79–97)
MONOCYTES: 9 %
Monocytes Absolute: 0.7 10*3/uL (ref 0.1–0.9)
NEUTROS ABS: 4.6 10*3/uL (ref 1.4–7.0)
Neutrophils: 59 %
Platelets: 229 10*3/uL (ref 150–379)
RBC: 5.08 x10E6/uL (ref 4.14–5.80)
RDW: 14.2 % (ref 12.3–15.4)
WBC: 7.7 10*3/uL (ref 3.4–10.8)

## 2016-04-17 LAB — FOLATE: Folate: 9.8 ng/mL (ref >3.0)

## 2016-04-17 LAB — VITAMIN B12: Vitamin B-12: 375 pg/mL (ref 232–1245)

## 2016-05-08 ENCOUNTER — Ambulatory Visit (INDEPENDENT_AMBULATORY_CARE_PROVIDER_SITE_OTHER): Payer: 59 | Admitting: Otolaryngology

## 2016-05-08 DIAGNOSIS — K146 Glossodynia: Secondary | ICD-10-CM

## 2016-05-14 ENCOUNTER — Ambulatory Visit: Payer: 59 | Admitting: Family Medicine

## 2016-05-29 ENCOUNTER — Other Ambulatory Visit: Payer: Self-pay | Admitting: Family Medicine

## 2016-06-10 ENCOUNTER — Encounter: Payer: Self-pay | Admitting: Gastroenterology

## 2016-06-12 ENCOUNTER — Ambulatory Visit (INDEPENDENT_AMBULATORY_CARE_PROVIDER_SITE_OTHER): Payer: 59 | Admitting: Otolaryngology

## 2016-06-12 DIAGNOSIS — K146 Glossodynia: Secondary | ICD-10-CM

## 2016-07-17 ENCOUNTER — Other Ambulatory Visit: Payer: Self-pay | Admitting: Family Medicine

## 2016-07-22 ENCOUNTER — Encounter: Payer: Self-pay | Admitting: Family Medicine

## 2016-07-22 ENCOUNTER — Ambulatory Visit (INDEPENDENT_AMBULATORY_CARE_PROVIDER_SITE_OTHER): Payer: 59 | Admitting: Family Medicine

## 2016-07-22 VITALS — BP 118/77 | HR 68 | Temp 97.6°F | Ht 69.0 in | Wt 205.0 lb

## 2016-07-22 DIAGNOSIS — H6122 Impacted cerumen, left ear: Secondary | ICD-10-CM | POA: Diagnosis not present

## 2016-07-22 DIAGNOSIS — H8302 Labyrinthitis, left ear: Secondary | ICD-10-CM

## 2016-07-22 DIAGNOSIS — H6505 Acute serous otitis media, recurrent, left ear: Secondary | ICD-10-CM | POA: Diagnosis not present

## 2016-07-22 MED ORDER — AMOXICILLIN-POT CLAVULANATE 875-125 MG PO TABS: 1.0000 | ORAL_TABLET | Freq: Two times a day (BID) | ORAL | 0 refills | Status: DC

## 2016-07-22 NOTE — Progress Notes (Signed)
Subjective:  Patient ID: Terry Pacheco, male    DOB: [birth year]  Age: 66 y.o. MRN: [id number]  CC: Dizziness (pt here today c/o dizziness, queezy feeling and off balance since last week that he thinks has to do with his inner ear (left))   HPI Terry Pacheco presents for Symptoms started 1 week ago with a feeling of his left ear being stopped up. It has been feeling of pressure and pain and just hurting. Pain is moderate. Denies hearing having been affected . Denies fever chills and sweats. He has had a headache he describes as a sick headache. It is associated with significant nausea but no vomiting. It is focused over the left part of his scalp. No vision changes or photophobia reported.   History Balian has a past medical history of Arthritis; GERD (gastroesophageal reflux disease) (07/06/2014); Hypertension; Sleep apnea; and Wears glasses.   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   His family history includes Heart disease in his mother; Stroke in his father.He reports that he has quit smoking. His smoking use included Cigarettes. He started smoking about 33 years ago. He has a 22.50 pack-year smoking history. He has never used smokeless tobacco. He reports that he drinks alcohol. He reports that he uses drugs, including Marijuana.    ROS Review of Systems  Constitutional: Positive for appetite change (decreased). Negative for chills, diaphoresis and fever.  HENT: Positive for ear pain (left only). Negative for rhinorrhea and sore throat.   Respiratory: Negative for cough and shortness of breath.   Cardiovascular: Negative for chest pain.  Gastrointestinal: Negative for abdominal pain.  Musculoskeletal: Negative for arthralgias and myalgias.  Skin: Negative for rash.  Neurological: Negative for weakness and headaches.    Objective:  BP 118/77   Pulse 68   Temp 97.6 F (36.4 C) (Oral)   Ht 5'  9" (1.753 m)   Wt 205 lb (93 kg)   BMI 30.27 kg/m   BP Readings from Last 3 Encounters:  07/22/16 118/77  04/16/16 127/77  12/19/15 120/75    Wt Readings from Last 3 Encounters:  07/22/16 205 lb (93 kg)  04/16/16 204 lb (92.5 kg)  12/19/15 195 lb 8 oz (88.7 kg)     Physical Exam  Constitutional: He appears well-developed and well-nourished.  HENT:  Head: Normocephalic and atraumatic.  Right Ear: Tympanic membrane and external ear normal. No decreased hearing is noted.  Left Ear: Hearing and external ear normal. A middle ear effusion is present. No decreased hearing is noted.  Mouth/Throat: No oropharyngeal exudate or posterior oropharyngeal erythema.  Left ear canal occluded by cerumen. After lavage there is serous otitis on the left.  Eyes: Pupils are equal, round, and reactive to light.  Neck: Normal range of motion. Neck supple.  Cardiovascular: Normal rate and regular rhythm.   No murmur heard. Pulmonary/Chest: Breath sounds normal. No respiratory distress.  Abdominal: Soft. Bowel sounds are normal. He exhibits no mass. There is no tenderness.  Vitals reviewed.     Assessment & Plan:   Jhayden was seen today for dizziness.  Diagnoses and all orders for this visit:  Recurrent acute serous otitis media of left ear -     amoxicillin-clavulanate (AUGMENTIN) 875-125 MG tablet; Take 1 tablet by mouth 2 (two) times daily. Take all of this medication  Impacted cerumen of left ear  Labyrinthitis of left ear    Left-sided  lavage performed.    I have discontinued Mr. Madariaga meloxicam and Vitamin B-12. I am also having him start on amoxicillin-clavulanate. Additionally, I am having him maintain his escitalopram, omeprazole, atorvastatin, and amLODipine.  Allergies as of 07/22/2016      Reactions   Crestor [rosuvastatin]    Muscle aches      Medication List       Accurate as of 07/22/16 10:41 AM. Always use your most recent med list.          amLODipine 10  MG tablet Commonly known as:  NORVASC TAKE 1 TABLET EVERY DAY FOR FOR BLOOD PRESSURE   amoxicillin-clavulanate 875-125 MG tablet Commonly known as:  AUGMENTIN Take 1 tablet by mouth 2 (two) times daily. Take all of this medication   atorvastatin 40 MG tablet Commonly known as:  LIPITOR TAKE 1 TABLET (40 MG TOTAL) BY MOUTH DAILY.   escitalopram 20 MG tablet Commonly known as:  LEXAPRO TAKE 0.5 TABLETS (10 MG TOTAL) BY MOUTH DAILY.   omeprazole 20 MG capsule Commonly known as:  PRILOSEC TAKE 1 CAPSULE (20 MG TOTAL) BY MOUTH DAILY.        Follow-up: Return if symptoms worsen or fail to improve.  Claretta Fraise, M.D.

## 2016-07-23 DIAGNOSIS — H47323 Drusen of optic disc, bilateral: Secondary | ICD-10-CM | POA: Diagnosis not present

## 2016-07-23 DIAGNOSIS — H2513 Age-related nuclear cataract, bilateral: Secondary | ICD-10-CM | POA: Diagnosis not present

## 2016-08-30 ENCOUNTER — Other Ambulatory Visit: Payer: Self-pay | Admitting: Family Medicine

## 2016-09-07 ENCOUNTER — Other Ambulatory Visit: Payer: Self-pay | Admitting: Family Medicine

## 2016-10-17 ENCOUNTER — Other Ambulatory Visit: Payer: Self-pay | Admitting: Family Medicine

## 2016-10-22 ENCOUNTER — Encounter: Payer: Self-pay | Admitting: Family Medicine

## 2016-10-22 ENCOUNTER — Ambulatory Visit (INDEPENDENT_AMBULATORY_CARE_PROVIDER_SITE_OTHER): Payer: 59 | Admitting: Family Medicine

## 2016-10-22 ENCOUNTER — Ambulatory Visit (INDEPENDENT_AMBULATORY_CARE_PROVIDER_SITE_OTHER): Payer: 59

## 2016-10-22 VITALS — BP 135/84 | HR 79 | Temp 97.1°F | Ht 69.0 in | Wt 208.0 lb

## 2016-10-22 DIAGNOSIS — R05 Cough: Secondary | ICD-10-CM | POA: Diagnosis not present

## 2016-10-22 DIAGNOSIS — R059 Cough, unspecified: Secondary | ICD-10-CM

## 2016-10-22 DIAGNOSIS — J189 Pneumonia, unspecified organism: Secondary | ICD-10-CM | POA: Diagnosis not present

## 2016-10-22 MED ORDER — BETAMETHASONE SOD PHOS & ACET 6 (3-3) MG/ML IJ SUSP
6.0000 mg | Freq: Once | INTRAMUSCULAR | Status: AC
  Administered 2016-10-22: 6 mg via INTRAMUSCULAR

## 2016-10-22 MED ORDER — HYDROCODONE-HOMATROPINE 5-1.5 MG/5ML PO SYRP: 5.0000 mL | ORAL_SOLUTION | Freq: Three times a day (TID) | ORAL | 0 refills | Status: DC | PRN

## 2016-10-22 MED ORDER — LEVOFLOXACIN 500 MG PO TABS: 500.0000 mg | ORAL_TABLET | Freq: Every day | ORAL | 0 refills | Status: DC

## 2016-10-22 MED ORDER — CEFTRIAXONE SODIUM 1 G IJ SOLR
1.0000 g | Freq: Once | INTRAMUSCULAR | Status: AC
  Administered 2016-10-22: 1 g via INTRAMUSCULAR

## 2016-10-22 NOTE — Progress Notes (Signed)
Chief Complaint  Patient presents with  . Cough    pt here today c/o cough, ears popping and headaches x 3 weeks.    HPI  Patient presents today for Cough increasing over the last 3 weeks. Earaches and headaches as well. He admits to being a half pack-a-day smoker. In the past even smoked more. He has been tapering but he still at 8-10 cigarettes a day. He denies shortness of breath. He has had some fever chills and sweats subjectively. Cough has been severe and productive of brown sputum.  PMH: Smoking status noted ROS: Review of Systems  Constitutional: Positive for diaphoresis and fever. Negative for chills.  HENT: Positive for congestion. Negative for rhinorrhea and sore throat.   Respiratory: Negative for cough and shortness of breath.   Cardiovascular: Negative for chest pain.  Gastrointestinal: Negative for abdominal pain.  Musculoskeletal: Negative for arthralgias and myalgias.  Skin: Negative for rash.  Neurological: Positive for light-headedness and headaches. Negative for weakness.     Objective: BP 135/84   Pulse 79   Temp (!) 97.1 F (36.2 C) (Oral)   Ht 5\' 9"  (1.753 m)   Wt 208 lb (94.3 kg)   BMI 30.72 kg/m  Gen: NAD, alert, cooperative with exam HEENT: NCAT, EOMI, PERRL CV: RRR, good S1/S2, no murmur Resp: Few wet rales at right mid to lower lung field. Occasional rhonchus throughout. Ext: No edema, warm Neuro: Alert and oriented, No gross deficits Checks x-ray: No apparent infiltrate to correspond to the rales. Slight fullness of the right lower perihilar bronchi Assessment and plan:  1. Pneumonitis   2. Cough     Meds ordered this encounter  Medications  . HYDROcodone-homatropine (HYCODAN) 5-1.5 MG/5ML syrup    Sig: Take 5 mLs by mouth every 8 (eight) hours as needed for cough.    Dispense:  120 mL    Refill:  0  . levofloxacin (LEVAQUIN) 500 MG tablet    Sig: Take 1 tablet (500 mg total) by mouth daily.    Dispense:  10 tablet    Refill:  0  .  cefTRIAXone (ROCEPHIN) injection 1 g  . betamethasone acetate-betamethasone sodium phosphate (CELESTONE) injection 6 mg    Orders Placed This Encounter  Procedures  . DG Chest 2 View    Standing Status:   Future    Number of Occurrences:   1    Standing Expiration Date:   12/22/2017    Order Specific Question:   Reason for Exam (SYMPTOM  OR DIAGNOSIS REQUIRED)    Answer:   cough    Order Specific Question:   Preferred imaging location?    Answer:   Internal    Follow up as needed.  Claretta Fraise, MD

## 2016-10-25 DIAGNOSIS — C801 Malignant (primary) neoplasm, unspecified: Secondary | ICD-10-CM

## 2016-10-25 HISTORY — DX: Malignant (primary) neoplasm, unspecified: C80.1

## 2016-11-06 DIAGNOSIS — F17210 Nicotine dependence, cigarettes, uncomplicated: Secondary | ICD-10-CM | POA: Diagnosis not present

## 2016-11-06 DIAGNOSIS — C099 Malignant neoplasm of tonsil, unspecified: Secondary | ICD-10-CM | POA: Diagnosis not present

## 2016-11-06 DIAGNOSIS — K148 Other diseases of tongue: Secondary | ICD-10-CM | POA: Diagnosis not present

## 2016-11-06 DIAGNOSIS — K130 Diseases of lips: Secondary | ICD-10-CM | POA: Diagnosis not present

## 2016-11-06 DIAGNOSIS — Z7289 Other problems related to lifestyle: Secondary | ICD-10-CM | POA: Diagnosis not present

## 2016-11-07 ENCOUNTER — Other Ambulatory Visit: Payer: Self-pay | Admitting: Otolaryngology

## 2016-11-07 DIAGNOSIS — D3705 Neoplasm of uncertain behavior of pharynx: Secondary | ICD-10-CM

## 2016-11-17 ENCOUNTER — Ambulatory Visit
Admission: RE | Admit: 2016-11-17 | Discharge: 2016-11-17 | Disposition: A | Discharge status: Home / Self Care | Payer: 59 | Source: Ambulatory Visit | Attending: Otolaryngology | Admitting: Otolaryngology

## 2016-11-17 DIAGNOSIS — J392 Other diseases of pharynx: Secondary | ICD-10-CM | POA: Diagnosis not present

## 2016-11-17 DIAGNOSIS — D3705 Neoplasm of uncertain behavior of pharynx: Secondary | ICD-10-CM

## 2016-11-17 MED ORDER — IOPAMIDOL (ISOVUE-300) INJECTION 61%
75.0000 mL | Freq: Once | INTRAVENOUS | Status: AC | PRN
  Administered 2016-11-17: 75 mL via INTRAVENOUS

## 2016-11-24 ENCOUNTER — Other Ambulatory Visit: Payer: Self-pay | Admitting: Otolaryngology

## 2016-11-24 ENCOUNTER — Other Ambulatory Visit (HOSPITAL_COMMUNITY): Payer: Self-pay | Admitting: Otolaryngology

## 2016-11-24 DIAGNOSIS — C099 Malignant neoplasm of tonsil, unspecified: Secondary | ICD-10-CM

## 2016-12-01 ENCOUNTER — Encounter: Payer: Self-pay | Admitting: Radiation Oncology

## 2016-12-01 ENCOUNTER — Telehealth: Payer: Self-pay | Admitting: *Deleted

## 2016-12-01 NOTE — Telephone Encounter (Signed)
Oncology Nurse Navigator Documentation  Placed introductory call to new referral patient Terry Pacheco.  Spoke with him and his wife during separate calls.  Introduced myself as the H&N oncology nurse navigator that works with Dr. Isidore Moos to whom he has been referred by Dr. Erik Obey.    I briefly explained my role as their navigator, indicated that I would be joining them during upcoming appts.   They voiced understanding appts with Dr. Isidore Moos and Dr. Irene Limbo are being arranged after currently scheduled imaging (10/9 PET, 10/16 MRI).    He indicated he sees his dentist twice yearly, has several fillings, lower L bridge.  I explained the purpose of a dental evaluation prior to starting RT, indicated he wd be contacted [facility] to arrange an appt within a day or so of his appt with Dr. Isidore Moos.   I confirmed his understanding of [facility], locations of [facility] [employer], [facility] [employer], explained arrival and [facility] registration process for appt.  I provided my contact information, encouraged them to call with questions/concerns prior to appts.   They verbalized understanding of information provided, expressed appreciation for my call.  Gayleen Orem, RN, BSN, [place] [facility] Nurse [place] [facility] [place] [phone]

## 2016-12-02 ENCOUNTER — Ambulatory Visit (HOSPITAL_COMMUNITY)
Admission: RE | Admit: 2016-12-02 | Discharge: 2016-12-02 | Disposition: A | Discharge status: Home / Self Care | Payer: 59 | Source: Ambulatory Visit | Attending: Otolaryngology | Admitting: Otolaryngology

## 2016-12-02 DIAGNOSIS — K449 Diaphragmatic hernia without obstruction or gangrene: Secondary | ICD-10-CM | POA: Diagnosis not present

## 2016-12-02 DIAGNOSIS — J449 Chronic obstructive pulmonary disease, unspecified: Secondary | ICD-10-CM | POA: Insufficient documentation

## 2016-12-02 DIAGNOSIS — K5730 Diverticulosis of large intestine without perforation or abscess without bleeding: Secondary | ICD-10-CM | POA: Diagnosis not present

## 2016-12-02 DIAGNOSIS — I2510 Atherosclerotic heart disease of native coronary artery without angina pectoris: Secondary | ICD-10-CM | POA: Insufficient documentation

## 2016-12-02 DIAGNOSIS — I700 Atherosclerosis of aorta: Secondary | ICD-10-CM | POA: Diagnosis not present

## 2016-12-02 DIAGNOSIS — N281 Cyst of kidney, acquired: Secondary | ICD-10-CM | POA: Diagnosis not present

## 2016-12-02 DIAGNOSIS — C099 Malignant neoplasm of tonsil, unspecified: Secondary | ICD-10-CM | POA: Diagnosis not present

## 2016-12-02 LAB — GLUCOSE, CAPILLARY: Glucose-Capillary: 105 mg/dL — ABNORMAL HIGH (ref 65–99)

## 2016-12-02 MED ORDER — FLUDEOXYGLUCOSE F - 18 (FDG) INJECTION
10.7900 | Freq: Once | INTRAVENOUS | Status: AC | PRN
  Administered 2016-12-02: 10.79 via INTRAVENOUS

## 2016-12-07 ENCOUNTER — Other Ambulatory Visit: Payer: Self-pay | Admitting: Family Medicine

## 2016-12-09 ENCOUNTER — Ambulatory Visit
Admission: RE | Admit: 2016-12-09 | Discharge: 2016-12-09 | Disposition: A | Discharge status: Home / Self Care | Payer: 59 | Source: Ambulatory Visit | Attending: Otolaryngology | Admitting: Otolaryngology

## 2016-12-09 DIAGNOSIS — C099 Malignant neoplasm of tonsil, unspecified: Secondary | ICD-10-CM

## 2016-12-09 MED ORDER — GADOBENATE DIMEGLUMINE 529 MG/ML IV SOLN
20.0000 mL | Freq: Once | INTRAVENOUS | Status: AC | PRN
  Administered 2016-12-09: 20 mL via INTRAVENOUS

## 2016-12-10 ENCOUNTER — Encounter: Payer: Self-pay | Admitting: Hematology

## 2016-12-10 NOTE — Progress Notes (Signed)
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 Immunostain p16 pending. --Addendum Comment "RIGHT FAUCIAL TONSIL", BIOPSY: Immunostain for p16 is positive with =70% nuclear and cytoplasmic staining.  Patient presented months ago with symptoms of: He had right lateral tongue numbness which started about a year ago.   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   Nutrition Status Yes No Comments  Weight changes? [x]  []  He had no weight loss, he has recently gained weight   Swallowing concerns? []  [x]    PEG? []  [x]     Referrals Yes No Comments  Social Work? []  [x]    Dentistry? [x]  []  He has an appointment today with Dr. Enrique Sack at 12:45  Swallowing therapy? []  [x]    Nutrition? []  [x]    Med/Onc? [x]  []  Dr. Irene Limbo at 11:00   Safety Issues Yes No Comments  Prior radiation? []  [x]    Pacemaker/ICD? []  [x]    Possible current pregnancy? []  [x]    Is the patient on methotrexate? []  [x]     Tobacco/Marijuana/Snuff/ETOH use: He is a former smoker. He smoked one pack cigarettes daily for 40 + years. He also has smoked marijuana in the past, the last time was 09/2015.  Past/Anticipated interventions by otolaryngology, if any:  Dr. Erik Obey biopsy 0/93/23  Past/Anticipated interventions by medical oncology, if any:  He has an appointment with Dr. Irene Limbo today at 11:00    Current Complaints / other details:   12/02/16 PET 12/09/16 MRI face/trigeminal area.   BP (!) 141/93   Pulse 70   Temp 98.5 F (36.9 C)   Wt 210 lb 9.6 oz (95.5 kg)   SpO2 94% Comment: room air  BMI 31.10 kg/m    Wt Readings from Last 3 Encounters:  12/16/16 210 lb 9.6 oz (95.5 kg)  10/22/16 208 lb (94.3 kg)  07/22/16 205 lb (93 kg)

## 2016-12-11 ENCOUNTER — Ambulatory Visit: Payer: 59 | Admitting: Hematology

## 2016-12-13 NOTE — Progress Notes (Signed)
HEMATOLOGY/ONCOLOGY CONSULTATION NOTE  Date of Service: 12/16/2016  Patient Care Team: Claretta Fraise, MD as PCP - General (Family Medicine) Jodi Marble, MD as Consulting Physician (Otolaryngology) Eppie Gibson, MD as Attending Physician (Radiation Oncology) Leota Sauers, RN as Oncology Nurse Navigator (Oncology)  CHIEF COMPLAINTS/PURPOSE OF CONSULTATION:  Newly diagnosed tonsillar squamous cell carcinoma  HISTORY OF PRESENTING ILLNESS:   Terry Pacheco is a wonderful 66 y.o. male who has been referred to [place] by ENT Specialist, Dr. Erik Obey for evaluation and management of invasive squamous cell carcinoma of tonsil.   He has a PMHx of GERD, HTN, High cholesterol which he takes medications for. He denies PMHx of seizures. He is accompanied by his wife and sister today. He reports that he is doing well overall. The pt initially presented with sudden-onset right lateral tongue numbness that has been ongoing for approximately 1 year. Subsequently, he had left sided gum swelling and intermittent sore throat symptoms. He notes no stroke-like symptoms. He notes that following these symptoms, he was evaluated by multiple providers including an ENT specialist and a dentist prior to meeting with Dr. Erik Obey. Pt wife noted swelling and discoloration inside his mouth prior to the patient being evaluated by Dr. Erik Obey. At his first visit with Dr. Erik Obey, he noted an abnormality to his left tonsil and completed a biopsy that same day.   He has had CT soft tissue neck with contrast on 11/18/2016 with results showing: Fullness of the right palatine tonsil. Surrounding fat planes are ill-defined. This area is obscured by dental artifact. Possible right tonsillar carcinoma.   The patient has had biopsy completed on 11/19/2016 with results of: "RIGHT FAUCIAL TONSIL", BIOPSY: Squamous cell carcinoma, suspicious for superficial invasion in this material. Immunostain p16 positive.   He also had a PET  scan completed on 12/02/2016 with results revealing: IMPRESSION: 1. Mildly asymmetric tonsillar activity along the right tonsillar sinus, maximum SUV 7.7 as compared to the left side 5.2, suspicious in this setting for right-sided malignancy. There is also a mildly enlarged and hypermetabolic right level IIa lymph node with maximum SUV of 8.4. No evidence of other metastatic spread. 2. Faint accentuation of activity in the left lateral prostate gland apex, but low-grade. Correlate with PSA level in determining whether further workup is warranted. 3. Other imaging findings of potential clinical significance: Aortic Atherosclerosis (ICD10-I70.0) and Emphysema (ICD10-J43.9). Coronary atherosclerosis. Small type 1 hiatal hernia. Bilateral renal cysts. Colonic diverticulosis. Lastly, he had a MR Face trigeminal on 12/09/2016 with results of IMPRESSION: 1. No evidence of perineural spread of malignancy. 2. Mild asymmetry of the palatine tonsils without discrete mass. Correlate with direct visualization.   As far as surgeries, he has had a polyp on left sided throat that was removed 3-4 years ago that resulted negative for malignancy. He also has had a right hip replacement. Lastly, he has had a left inguinal hernia operation in 1978. He started smoking cigarettes at age 47 and would smoke 1 PPD while working. When he retired several years ago he smoked 0.5 PPD and has quit smoking cigarettes 1 month ago since his diagnosis. He states that he used a vape after quitting but was advised to quit via Dr. Isidore Moos. He occassionaly consumes ETOH. He notes that he previously worked in a Education administrator at [employer]. Denies allergies to medications at this time. He reports that his father died of a stroke at age 68 and his mother died from cardiac issues. Mother had  gastric cancer and was diagnosed in late 33's. Denies any other cancers of blood disorders in the family.    On review of systems, he denies  hematuria, dysuria, or urinary retention. He reports urinary frequency over the past year. He reports bowel incontinence following a bowel movement x 1 year that has gradually improved more recently. He notes that his bowel incontinence has been intermittent. He has well formed stools without blood in stools or rectal bleeding. He denies increase in bowel movements and he has up to 1 bowel movement daily. He has had 2-3 occurrences of hemorrhoids with no recent flares. He denies mucous in his stools. He denies injuries or surgeries to his rectal area. He denies bladder incontinence. He has lower back pain that has been chronic and unchanged. He has a prior hx of diverticulitis approximately 10 years ago that was followed with colonoscopy and was then diagnosed with diverticulosis. He denies headache at this time. He is able to ambulate for prolonged periods without dyspnea on exertion. 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 He has had an audiogram completed by an ENT specialist in [place]:  Past Medical History:  Diagnosis Date  . Arthritis   . GERD (gastroesophageal reflux disease) 07/06/2014  . Hypertension   . Sleep apnea    does not use CPAP  . Wears glasses     SURGICAL HISTORY: Past Surgical History:  Procedure Laterality Date  . HERNIA REPAIR    . polyp removal  2008   during colonoscopy  . TOTAL HIP ARTHROPLASTY Right 10/16/2015   Procedure: RIGHT TOTAL HIP ARTHROPLASTY ANTERIOR APPROACH;  Surgeon: Paralee Cancel, MD;  Location: [facility];  Service: Orthopedics;  Laterality: Right;  . vocal cord polyp      removed 2-3 years ago     SOCIAL HISTORY: Social History   Social History  . Marital status: Married    Spouse name: N/A  . Number of children: N/A  . Years of education: N/A   Occupational History  . Not on file.   Social History Main Topics  . Smoking status: Former Smoker    Packs/day:  0.50    Years: 45.00    Types: Cigarettes    Start date: 01/12/1983    Quit date: 11/17/2016  . Smokeless tobacco: Never Used  . Alcohol use 0.0 oz/week     Comment: rare  . Drug use: Yes    Types: Marijuana     Comment: last use 10/09/2015  . Sexual activity: Not on file   Other Topics Concern  . Not on file   Social History Narrative  . No narrative on file    FAMILY HISTORY: Family History  Problem Relation Age of Onset  . Heart disease Mother   . Stroke Father     ALLERGIES:  is allergic to crestor [rosuvastatin].  MEDICATIONS:  Current Outpatient Prescriptions  Medication Sig Dispense Refill  . amLODipine (NORVASC) 10 MG tablet TAKE 1 TABLET EVERY DAY FOR FOR BLOOD PRESSURE 90 tablet 0  . atorvastatin (LIPITOR) 40 MG tablet TAKE 1 TABLET BY MOUTH EVERY DAY 90 tablet 0  . escitalopram (LEXAPRO) 20 MG tablet TAKE 0.5 TABLETS (10 MG TOTAL) BY MOUTH DAILY. 45 tablet 0  . HYDROcodone-homatropine (HYCODAN) 5-1.5 MG/5ML syrup Take 5 mLs by mouth every 8 (eight) hours as needed for cough. (Patient not taking: Reported on 12/16/2016) 120 mL 0  .  Multiple Vitamin (MULTIVITAMIN) tablet Take 1 tablet by mouth daily.    Marland Kitchen omeprazole (PRILOSEC) 20 MG capsule TAKE 1 CAPSULE BY MOUTH EVERY DAY 90 capsule 0  . TURMERIC PO Take 1,200 mg by mouth.     No current facility-administered medications for this visit.     REVIEW OF SYSTEMS:    10 Point review of Systems was done is negative except as noted above.  PHYSICAL EXAMINATION: ECOG PERFORMANCE STATUS: 1 - Symptomatic but completely ambulatory  . Vitals:   12/16/16 1036  BP: (!) 144/92  Pulse: 67  Resp: 18  Temp: 97.7 F (36.5 C)  SpO2: 96%   Filed Weights   12/16/16 1036  Weight: 210 lb 6.4 oz (95.4 kg)   .Body mass index is 31.07 kg/m.  GENERAL:alert, in no acute distress and comfortable SKIN: no acute rashes, no significant lesions EYES: conjunctiva are pink and non-injected, sclera anicteric OROPHARYNX:  Right tonsillar mass that is close to the base of the tongue and extending into the right side of the roof of his mouth. MMM, no exudates, no oropharyngeal erythema or ulceration NECK: supple, no JVD. Small 1 cm lymph node in right upper neck.  LYMPH:  no palpable lymphadenopathy in the cervical, axillary or inguinal regions LUNGS: clear to auscultation b/l with normal respiratory effort HEART: 2/6 systolic murmur. Regular rate & rhythm ABDOMEN:  normoactive bowel sounds , non tender, not distended. Extremity: no pedal edema PSYCH: alert & oriented x 3 with fluent speech NEURO: no focal motor/sensory deficits    LABORATORY DATA:  I have reviewed the data as listed  . CBC Latest Ref Rng & Units 12/16/2016 04/16/2016 10/17/2015  WBC 4.0 - 10.3 10e3/uL 7.7 7.7 14.0(H)  Hemoglobin 13.0 - 17.1 g/dL 16.0 15.2 12.4(L)  Hematocrit 38.4 - 49.9 % 46.5 44.9 36.0(L)  Platelets 140 - 400 10e3/uL 193 229 198    . CMP Latest Ref Rng & Units 12/16/2016 04/16/2016 10/17/2015  Glucose 70 - 140 mg/dl 96 93 147(H)  BUN 7.0 - 26.0 mg/dL 20.3 15 16   Creatinine 0.7 - 1.3 mg/dL 1.2 1.01 0.83  Sodium 136 - 145 mEq/L 139 138 135  Potassium 3.5 - 5.1 mEq/L 4.4 4.6 4.5  Chloride 96 - 106 mmol/L - 99 106  CO2 22 - 29 mEq/L 24 23 23   Calcium 8.4 - 10.4 mg/dL 9.7 9.4 8.6(L)  Total Protein 6.4 - 8.3 g/dL 7.5 7.0 -  Total Bilirubin 0.20 - 1.20 mg/dL 0.80 0.6 -  Alkaline Phos 40 - 150 U/L 156(H) 154(H) -  AST 5 - 34 U/L 22 21 -  ALT 0 - 55 U/L 30 23 -    PATHOLOGY:    RADIOGRAPHIC STUDIES: I have personally reviewed the radiological images as listed and agreed with the findings in the report. Ct Soft Tissue Neck W Contrast  Addendum Date: 11/18/2016   ADDENDUM REPORT: 11/18/2016 14:59 ADDENDUM: After further review and discussion with Dr. Erik Obey, there is fullness of the right palatine tonsil. Surrounding fat planes are ill-defined. This area is obscured by dental artifact. Possible right tonsillar  carcinoma. Electronically Signed   By: Franchot Gallo M.D.   On: 11/18/2016 14:59   Result Date: 11/18/2016 CLINICAL DATA:  Rule out neoplasm tongue/ tonsil. History of left vocal cord polyp removed. EXAM: CT NECK WITH CONTRAST TECHNIQUE: Multidetector CT imaging of the neck was performed using the standard protocol following the bolus administration of intravenous contrast. CONTRAST:  27mL ISOVUE-300 IOPAMIDOL (ISOVUE-300) INJECTION 61% COMPARISON:  CT neck 06/16/2014 FINDINGS: Pharynx and larynx: Normal tongue and nasopharynx. Epiglottis and vocal cords normal. 7 x 9 mm cyst in the right lateral pharyngeal wall. This has homogeneous water density aside from a small calcification posteriorly. This was not present on the prior CT. This is located just above the tip of the epiglottis. Salivary glands: No inflammation, mass, or stone. Thyroid: Negative Lymph nodes: Right level 2 node 12 mm. No other prominent lymph nodes in the neck. Vascular: Carotid artery calcification bilaterally. Carotid artery and jugular vein patent bilaterally. Limited intracranial: Negative Visualized orbits: Negative Mastoids and visualized paranasal sinuses: Negative Skeleton: Moderate cervical disc degeneration and spurring. No acute skeletal abnormality. Upper chest: Mild apical lung scarring on the right. Other: None IMPRESSION: Negative for tongue mass 7 x 9 mm cyst right lateral pharyngeal wall. Overall this has a benign appearance. There is a small calcification layering dependently in the cyst. 12 mm right level 2 lymph node is prominent but not pathologic in size. No other prominent lymph nodes in the neck. Electronically Signed: By: Franchot Gallo M.D. On: 11/18/2016 07:27   Nm Pet Image Initial (pi) Skull Base To Thigh  Result Date: 12/02/2016 CLINICAL DATA:  Initial treatment strategy for tonsillar cancer. EXAM: NUCLEAR MEDICINE PET SKULL BASE TO THIGH TECHNIQUE: 10.8 mCi F-18 FDG was injected intravenously. Full-ring PET  imaging was performed from the skull base to thigh after the radiotracer. CT data was obtained and used for attenuation correction and anatomic localization. FASTING BLOOD GLUCOSE:  Value: 105 mg/dl COMPARISON:  CT neck 11/17/2016 FINDINGS: NECK Mildly asymmetric tonsillar activity along the right tonsillar sinus, maximum SUV 7.7 as compared to the left side which measures 5.2. The cystic lesion along the right lateral pharyngeal wall is not appreciably hypermetabolic. A right level IIa lymph node measures 1.2 cm in short axis on image 33/4 and has maximum SUV of 8.4, compatible with malignancy. Bilateral carotid atherosclerotic calcification. CHEST No hypermetabolic mediastinal or hilar nodes. No suspicious pulmonary nodules on the CT data. Paraseptal emphysema. Coronary, aortic arch, and branch vessel atherosclerotic vascular disease. Small type 1 hiatal hernia. ABDOMEN/PELVIS No abnormal hypermetabolic activity within the liver, pancreas, adrenal glands, or spleen. No hypermetabolic lymph nodes in the abdomen or pelvis. Faint accentuation of metabolic activity in the left lateral prostate gland apex, maximum SUV 4.5. Aortoiliac atherosclerotic vascular disease. Photopenic right kidney upper pole cyst. Left mid kidney cyst anteriorly. Diffuse colonic diverticulosis most severe in the sigmoid colon with associated wall thickening. Physiologic activity is observed in the bowel, especially the colon. No hypermetabolic adenopathy observed. SKELETON No focal hypermetabolic activity to suggest skeletal metastasis. Right hip prosthesis. Transitional lumbosacral vertebra. Unusual sternal angulation, query old fracture or pectus carinatum. IMPRESSION: 1. Mildly asymmetric tonsillar activity along the right tonsillar sinus, maximum SUV 7.7 as compared to the left side 5.2, suspicious in this setting for right-sided malignancy. There is also a mildly enlarged and hypermetabolic right level IIa lymph node with maximum SUV of  8.4. No evidence of other metastatic spread. 2. Faint accentuation of activity in the left lateral prostate gland apex, but low-grade. Correlate with PSA level in determining whether further workup is warranted. 3. Other imaging findings of potential clinical significance: Aortic Atherosclerosis (ICD10-I70.0) and Emphysema (ICD10-J43.9). Coronary atherosclerosis. Small type 1 hiatal hernia. Bilateral renal cysts. Colonic diverticulosis. Electronically Signed   By: Van Clines M.D.   On: 12/02/2016 15:53   Mr Face/trigeminal Wo/w Cm  Result Date: 12/09/2016 CLINICAL DATA:  Tonsillar cancer.  Evaluation for perineural spread. Creatinine was obtained on site at [facility] at [address] Results: Creatinine 1.3 mg/dL. EXAM: MRI FACE TRIGEMINAL WITHOUT AND WITH CONTRAST TECHNIQUE: Multiplanar, multiecho pulse sequences of the face and surrounding structures, including thin slice imaging of the course of the Trigeminal Nerves, were obtained both before and after administration of intravenous contrast. CONTRAST:  74mL MULTIHANCE GADOBENATE DIMEGLUMINE 529 MG/ML IV SOLN COMPARISON:  None. FINDINGS: Visualized brain: Normal parenchymal signal. No extra-axial collection. Orbits: Normal optic nerves and extra-axial muscles. The globes are intact. Skull base: Meckel's cave is normal bilaterally. No abnormal enhancement in foramina rotundum or ovale. No abnormality of the internal auditory canals or jugular foramina. No abnormality at the hypoglossal canal. Paranasal sinuses and mastoids: Clear. Aerodigestive tract: There is an unchanged cystic spaces along the right wall of the pharynx. There is mild asymmetry of the palatine tonsils but no clear mass. IMPRESSION: 1. No evidence of perineural spread of malignancy. 2. Mild asymmetry of the palatine tonsils without discrete mass. Correlate with direct visualization. Electronically Signed   By: Ulyses Jarred M.D.   On: 12/09/2016 18:54    ASSESSMENT  & PLAN:  66 y.o. 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 -creatinine WNL. Some concern for decreased hearing and baseline tinnitus due to previous job noise related injury. -will be getting baseline audiogram. -will plan to treat with concurrent chemotherapy (Cisplatin weekly) with concurrent RT. -I discussed and recommended with the patient, his wife, and his sister regarding cisplatin based chemotherapy at lower sensitizing doses without additional chemotherapy drugs.  -Will give fluids prior to cisplatin administration and fluids and electrolytes following cisplatin administration -will monitor kidney and electrolytes levels today prior to cisplatin therapy -Referral given to chemo planning counseling.  -Will order baseline ECG.  -Discussed with patient, pt wife, and sister regarding PEG tube insertion to maintain nutrition during chemotherapy and radiotherapy treatments.  -Patient to follow up with dentistry, Dr. Enrique Sack today and chemotherapy and radiotherapy treatment will began following his recommendations. (no dental extractions recommended) -Gayleen Orem, RN, our Head and Neck Oncology Navigator is present during this visit. He will aid with coordinating an audiogram with Dr. Noreene Filbert office for a baseline prior to chemotherapy. Will also look into scheduling follow up audiograms to monitor patient's hearing during chemotherapy.  -I discussed with the patient, his wife, and his sister that the pneumococcal vaccination will be administered approximately 2-3 months following chemotherapy. -I discussed with the patient, his wife, and his sister regarding Prevnar vaccination to be administered today.  -I recommend flu vaccination to be  administered today.      Labs today ECHO IR for port and PEG tube placement Chemo-counseling for Cisplatin Audiogram with Dr Claude Manges office Dental appointment today as per schedule Planning to start Cisplatin concurrent with RT in about 2 weeks RTC with Dr Irene Limbo with labs in 2 weeks    All of the patients questions were answered with apparent satisfaction. The patient knows to call the clinic with any problems, questions or concerns.  I spent 60 minutes counseling the patient face to face. The total time spent in the appointment was 80 minutes and more than 50% was on counseling and direct patient cares.    Preciosa Bundrick  Irene Limbo MD [place] AAHIVMS [facility] Hematology/Oncology Physician [place]  (Office):       [phone] (Work cell):  [phone] (Fax):           [phone]  12/16/2016 11:05 AM    This document serves as a record of services personally performed by Sullivan Lone, MD. It was created on her behalf by Steva Colder, a trained medical scribe. The creation of this record is based on the scribe's personal observations and the provider's statements to them. This document has been checked and approved by the attending provider.

## 2016-12-15 ENCOUNTER — Telehealth: Payer: Self-pay | Admitting: *Deleted

## 2016-12-15 NOTE — Telephone Encounter (Signed)
Oncology Nurse Navigator Documentation  Spoke with patient wife, confirmed understanding of tomorrow's appts starting with 0815 NE and consult with Dr. Isidore Moos.  I explained registration procedure for RadOnc, indicated I will be joining them during appts with Drs. Isidore Moos and [place].  She voiced understanding, appreciation.  Gayleen Orem, RN, BSN, [place] Neck Oncology Nurse [place] at [place] [phone]

## 2016-12-16 ENCOUNTER — Encounter: Payer: Self-pay | Admitting: Hematology

## 2016-12-16 ENCOUNTER — Telehealth: Payer: Self-pay

## 2016-12-16 ENCOUNTER — Ambulatory Visit (HOSPITAL_BASED_OUTPATIENT_CLINIC_OR_DEPARTMENT_OTHER): Payer: 59

## 2016-12-16 ENCOUNTER — Ambulatory Visit (HOSPITAL_BASED_OUTPATIENT_CLINIC_OR_DEPARTMENT_OTHER): Payer: 59 | Admitting: Hematology

## 2016-12-16 ENCOUNTER — Encounter: Payer: Self-pay | Admitting: *Deleted

## 2016-12-16 ENCOUNTER — Encounter (HOSPITAL_COMMUNITY): Payer: Self-pay | Admitting: Dentistry

## 2016-12-16 ENCOUNTER — Ambulatory Visit
Admission: RE | Admit: 2016-12-16 | Discharge: 2016-12-16 | Disposition: A | Discharge status: Home / Self Care | Payer: 59 | Source: Ambulatory Visit | Attending: Radiation Oncology | Admitting: Radiation Oncology

## 2016-12-16 ENCOUNTER — Telehealth: Payer: Self-pay | Admitting: *Deleted

## 2016-12-16 ENCOUNTER — Ambulatory Visit (HOSPITAL_COMMUNITY): Payer: Self-pay | Admitting: Dentistry

## 2016-12-16 ENCOUNTER — Encounter: Payer: Self-pay | Admitting: Radiation Oncology

## 2016-12-16 VITALS — BP 144/92 | HR 67 | Temp 97.7°F | Resp 18 | Ht 69.0 in | Wt 210.4 lb

## 2016-12-16 VITALS — BP 141/93 | HR 70 | Temp 98.5°F | Wt 210.6 lb

## 2016-12-16 VITALS — BP 137/74 | HR 60 | Temp 98.3°F

## 2016-12-16 DIAGNOSIS — M545 Low back pain: Secondary | ICD-10-CM

## 2016-12-16 DIAGNOSIS — Z01818 Encounter for other preprocedural examination: Secondary | ICD-10-CM

## 2016-12-16 DIAGNOSIS — K08409 Partial loss of teeth, unspecified cause, unspecified class: Secondary | ICD-10-CM

## 2016-12-16 DIAGNOSIS — Z96641 Presence of right artificial hip joint: Secondary | ICD-10-CM | POA: Insufficient documentation

## 2016-12-16 DIAGNOSIS — Z823 Family history of stroke: Secondary | ICD-10-CM | POA: Insufficient documentation

## 2016-12-16 DIAGNOSIS — Z23 Encounter for immunization: Secondary | ICD-10-CM

## 2016-12-16 DIAGNOSIS — G4730 Sleep apnea, unspecified: Secondary | ICD-10-CM | POA: Diagnosis not present

## 2016-12-16 DIAGNOSIS — N429 Disorder of prostate, unspecified: Secondary | ICD-10-CM

## 2016-12-16 DIAGNOSIS — C099 Malignant neoplasm of tonsil, unspecified: Secondary | ICD-10-CM | POA: Diagnosis not present

## 2016-12-16 DIAGNOSIS — Z79899 Other long term (current) drug therapy: Secondary | ICD-10-CM | POA: Insufficient documentation

## 2016-12-16 DIAGNOSIS — C090 Malignant neoplasm of tonsillar fossa: Secondary | ICD-10-CM

## 2016-12-16 DIAGNOSIS — K06010 Localized gingival recession, unspecified: Secondary | ICD-10-CM

## 2016-12-16 DIAGNOSIS — M264 Malocclusion, unspecified: Secondary | ICD-10-CM

## 2016-12-16 DIAGNOSIS — M270 Developmental disorders of jaws: Secondary | ICD-10-CM

## 2016-12-16 DIAGNOSIS — Z510 Encounter for antineoplastic radiation therapy: Secondary | ICD-10-CM | POA: Insufficient documentation

## 2016-12-16 DIAGNOSIS — I10 Essential (primary) hypertension: Secondary | ICD-10-CM | POA: Diagnosis not present

## 2016-12-16 DIAGNOSIS — Z888 Allergy status to other drugs, medicaments and biological substances status: Secondary | ICD-10-CM | POA: Diagnosis not present

## 2016-12-16 DIAGNOSIS — Z87891 Personal history of nicotine dependence: Secondary | ICD-10-CM | POA: Insufficient documentation

## 2016-12-16 DIAGNOSIS — K219 Gastro-esophageal reflux disease without esophagitis: Secondary | ICD-10-CM | POA: Insufficient documentation

## 2016-12-16 DIAGNOSIS — K0530 Chronic periodontitis, unspecified: Secondary | ICD-10-CM

## 2016-12-16 DIAGNOSIS — F17211 Nicotine dependence, cigarettes, in remission: Secondary | ICD-10-CM | POA: Diagnosis not present

## 2016-12-16 DIAGNOSIS — K036 Deposits [accretions] on teeth: Secondary | ICD-10-CM

## 2016-12-16 DIAGNOSIS — M1990 Unspecified osteoarthritis, unspecified site: Secondary | ICD-10-CM | POA: Insufficient documentation

## 2016-12-16 DIAGNOSIS — Z8249 Family history of ischemic heart disease and other diseases of the circulatory system: Secondary | ICD-10-CM | POA: Diagnosis not present

## 2016-12-16 LAB — CBC & DIFF AND RETIC
BASO%: 0.3 % (ref 0.0–2.0)
BASOS ABS: 0 10*3/uL (ref 0.0–0.1)
EOS ABS: 0.5 10*3/uL (ref 0.0–0.5)
EOS%: 6 % (ref 0.0–7.0)
HEMATOCRIT: 46.5 % (ref 38.4–49.9)
HEMOGLOBIN: 16 g/dL (ref 13.0–17.1)
Immature Retic Fract: 5.5 % (ref 3.00–10.60)
LYMPH%: 25.3 % (ref 14.0–49.0)
MCH: 31 pg (ref 27.2–33.4)
MCHC: 34.4 g/dL (ref 32.0–36.0)
MCV: 90.1 fL (ref 79.3–98.0)
MONO#: 0.6 10*3/uL (ref 0.1–0.9)
MONO%: 8 % (ref 0.0–14.0)
NEUT%: 60.4 % (ref 39.0–75.0)
NEUTROS ABS: 4.6 10*3/uL (ref 1.5–6.5)
Platelets: 193 10*3/uL (ref 140–400)
RBC: 5.16 10*6/uL (ref 4.20–5.82)
RDW: 12.8 % (ref 11.0–14.6)
RETIC %: 1.76 % (ref 0.80–1.80)
RETIC CT ABS: 90.82 10*3/uL (ref 34.80–93.90)
WBC: 7.7 10*3/uL (ref 4.0–10.3)
lymph#: 1.9 10*3/uL (ref 0.9–3.3)

## 2016-12-16 LAB — PROTIME-INR
INR: 1 — AB (ref 2.00–3.50)
PROTIME: 12 s (ref 10.6–13.4)

## 2016-12-16 LAB — COMPREHENSIVE METABOLIC PANEL
ALBUMIN: 4.2 g/dL (ref 3.5–5.0)
ALT: 30 U/L (ref 0–55)
AST: 22 U/L (ref 5–34)
Alkaline Phosphatase: 156 U/L — ABNORMAL HIGH (ref 40–150)
Anion Gap: 11 mEq/L (ref 3–11)
BUN: 20.3 mg/dL (ref 7.0–26.0)
CALCIUM: 9.7 mg/dL (ref 8.4–10.4)
CHLORIDE: 104 meq/L (ref 98–109)
CO2: 24 mEq/L (ref 22–29)
CREATININE: 1.2 mg/dL (ref 0.7–1.3)
EGFR: >60 mL/min/{1.73_m2} (ref >60)
GLUCOSE: 96 mg/dL (ref 70–140)
Potassium: 4.4 mEq/L (ref 3.5–5.1)
SODIUM: 139 meq/L (ref 136–145)
Total Bilirubin: 0.8 mg/dL (ref 0.20–1.20)
Total Protein: 7.5 g/dL (ref 6.4–8.3)

## 2016-12-16 LAB — FERRITIN: Ferritin: 69 ng/ml (ref 22–316)

## 2016-12-16 LAB — IRON AND TIBC
%SAT: 24 % (ref 20–55)
Iron: 79 ug/dL (ref 42–163)
TIBC: 335 ug/dL (ref 202–409)
UIBC: 256 ug/dL (ref 117–376)

## 2016-12-16 LAB — MAGNESIUM: MAGNESIUM: 2.5 mg/dL (ref 1.5–2.5)

## 2016-12-16 MED ORDER — PNEUMOCOCCAL 13-VAL CONJ VACC IM SUSP
0.5000 mL | Freq: Once | INTRAMUSCULAR | Status: AC
  Administered 2016-12-16: 0.5 mL via INTRAMUSCULAR
  Filled 2016-12-16: qty 0.5

## 2016-12-16 MED ORDER — TRAZODONE HCL 50 MG PO TABS: 50.0000 mg | ORAL_TABLET | Freq: Every evening | ORAL | 1 refills | Status: DC | PRN

## 2016-12-16 MED ORDER — INFLUENZA VAC SPLIT HIGH-DOSE 0.5 ML IM SUSY
0.5000 mL | PREFILLED_SYRINGE | INTRAMUSCULAR | Status: AC
  Administered 2016-12-16: 0.5 mL via INTRAMUSCULAR
  Filled 2016-12-16: qty 0.5

## 2016-12-16 NOTE — Telephone Encounter (Signed)
Unable to reach patient with updates on appointment. I mailed the information on 10/23. Per 10/23 los request

## 2016-12-16 NOTE — Progress Notes (Signed)
Oncology Nurse Navigator Documentation  Met with patient and family during initial consult with Dr. Irene Limbo. They voiced understanding of plan for chemotherapy in conjunction with radiation. They voiced understanding I will coordinate baseline audiology exam with [facility]. I encourage them to give me a call with questions/concerns.  Gayleen Orem, RN, BSN, [place] Neck Oncology Nurse [place] at [place] [phone]

## 2016-12-16 NOTE — Patient Instructions (Signed)

## 2016-12-16 NOTE — Telephone Encounter (Signed)
Called to give schedule appointment updates for Audiology on  10/30 9am. Per 10/23 los

## 2016-12-16 NOTE — Telephone Encounter (Signed)
Oncology Nurse Navigator Documentation  Sent fax to [facility] ENT Audiology requesting baseline audiology appt, requested call/email when appt scheduled.  'Successful TX Notice' received.  Gayleen Orem, RN, BSN, [place] Neck Oncology Nurse [place] at [place] [phone]

## 2016-12-16 NOTE — Progress Notes (Signed)
Oncology Nurse Navigator Documentation  Met with Terry Pacheco during initial consult with Dr. Isidore Moos.  He was accompanied by his wife and dtr.   1. Further introduced myself as his Navigator, explained my role as a member of the Care Team.   2. Provided New Patient Information packet, discussed contents:  Contact information for physician(s), myself, other members of the Care Team.  Advance Directive information ([place] blue pamphlet with LCSW contact info).  Fall Prevention Patient Safety Plan  Appointment Guideline  Financial Assistance Information sheet  [place] with highlight of [place]  SLP Guidance 3. Provided introductory explanation of radiation treatment including SIM planning and purpose of Aquaplast head and shoulder mask, showed them example.   4. Provided and discussed education handouts for PEG and PAC.  5. Discussed/explained H&N MDC, he agreed to attendance 11/13, I provided 0800 arrival, explained registration procedure. 6. Provided a tour of SIM and [place] areas, explained treatment and arrival procedures. 7. I encouraged them to contact me with questions/concerns as treatments/procedures begin.   Escorted them to Registration for MedOnc appt with Dr. Irene Limbo.  Gayleen Orem, RN, BSN, [place] Neck Oncology Nurse [place] at [place] [phone]

## 2016-12-16 NOTE — Progress Notes (Addendum)
Radiation Oncology         [phone] ________________________________  Initial outpatient Consultation  Name: Terry Pacheco MRN: [id number]  Date: 12/16/2016  DOB: December 09, 1950  GM:WNUUVO, Cletus Gash, MD  Jodi Marble, MD   REFERRING PHYSICIAN: Jodi Marble, MD  DIAGNOSIS:    ICD-10-CM   1. 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 male who presented to Dr. Claretta Fraise for evaluation of tongue numbness. Began noticing numbness at area on right anterior lateral tongue around September 2017. He eventually developed possible retromolar trigone and swelling.   Superior pole of right tonsil was biopsied by Dr. Erik Obey in ENT on 5/36/64. Pathology showed squamous cell carcinoma suspicious for superficial invasion.  CT of neck on 11/17/16 showed 7 x 9 mm "cyst" in the right lateral pharyngeal wall with homogeneous water density aside from a small calcification posteriorly.This was not present on prior CT and is located just above the tip of the epiglottis. Also noted was prominent 12 mm right level 2 lymph node but not pathologic in size. Negative for tongue mass. Addendum concluded fullness of right palatine tonsil. Surrounding fat planes ill-defined and the area is obscured by dental artifact, thus possible right tonsillar carcinoma.  PET scan on 12/02/16 showed mildly asymmetric tonsillar activity along the right tonsillar sinus, maximum SUV 7.7 as compared to the left side 5.2, suspicious in this setting for right-sided malignancy. There is also  a mildly enlarged and hypermetabolic right level IIa lymph node with maximum SUV of 8.4. No evidence of other metastatic spread. Also noted was faint accentuation of activity in the left lateral prostate gland apex, but low-grade.   MRI face/trigeminal on 12/09/16 showed no evidence of perineural spread or malignancy. Mild asymmetry of the palatine tonsils was noted but without discrete mass. These correlated with direct visualization.  I reviewed the patient's imaging myself. ENT tumor board review was last week. Consensus was that patient was a good candidate for ChRT. TORS was not recommended by ENT team due to concern it would not necessarily deescalate his treatment.  Swallowing issues, if any: Patient denies swallowing difficulties.  Weight Changes: Patient endorses recent weight gain.  Pain status: Patient denies any pain.  Tobacco history, if any: Patient has hx of smoking but reports to have ceased smoking a month ago.  ETOH abuse, if any: Patient endorses drinking a few beers or less every few weeks at most.  Prior cancers, if any: none  The patient has been referred to radiation oncology to discuss adjuvant therapies. He inquires about surgical option as first line of treatment and possibility of second opinion.  Of note, patient was discussed at tumor board. He was not felt to be a good candidate for TORS. Chemoradiation was recommended. The patient's numbness in his tongue was felt to be lingual nerve involvement and the fifth cranial nerve looked fine on patient's MRI per radiology. The consensus was to treat the patient's tumor with margin but not track cranial nerves to base of skull.  On review of systems, patient states it felt like his  gum was swelling up but denies any sores or ulcers. Denies hx of cancer or RT.  The patient is retired from [employer]. Endorses tender spot at right mandible.   PREVIOUS RADIATION THERAPY: No  PAST MEDICAL HISTORY:  has a past medical history  of Arthritis; GERD (gastroesophageal reflux disease) (07/06/2014); Hypertension; Sleep apnea; and Wears glasses.    PAST SURGICAL HISTORY: Past Surgical History:  Procedure Laterality Date  . HERNIA REPAIR    . polyp removal  2008   during colonoscopy  . TOTAL HIP ARTHROPLASTY Right 10/16/2015   Procedure: RIGHT TOTAL HIP ARTHROPLASTY ANTERIOR APPROACH;  Surgeon: Paralee Cancel, MD;  Location: [facility];  Service: Orthopedics;  Laterality: Right;  . vocal cord polyp      removed 2-3 years ago     FAMILY HISTORY: family history includes Heart disease in his mother; Stroke in his father.  SOCIAL HISTORY:  reports that he quit smoking about 4 weeks ago. His smoking use included Cigarettes. He started smoking about 33 years ago. He has a 22.50 pack-year smoking history. He has never used smokeless tobacco. He reports that he drinks alcohol. He reports that he uses drugs, including Marijuana.  ALLERGIES: Crestor [rosuvastatin]  MEDICATIONS:  Current Outpatient Prescriptions  Medication Sig Dispense Refill  . amLODipine (NORVASC) 10 MG tablet TAKE 1 TABLET EVERY DAY FOR FOR BLOOD PRESSURE 90 tablet 0  . atorvastatin (LIPITOR) 40 MG tablet TAKE 1 TABLET BY MOUTH EVERY DAY 90 tablet 0  . escitalopram (LEXAPRO) 20 MG tablet TAKE 0.5 TABLETS (10 MG TOTAL) BY MOUTH DAILY. 45 tablet 0  . Multiple Vitamin (MULTIVITAMIN) tablet Take 1 tablet by mouth daily.    Marland Kitchen omeprazole (PRILOSEC) 20 MG capsule TAKE 1 CAPSULE BY MOUTH EVERY DAY 90 capsule 0  . TURMERIC PO Take 1,200 mg by mouth.    Marland Kitchen HYDROcodone-homatropine (HYCODAN) 5-1.5 MG/5ML syrup Take 5 mLs by mouth every 8 (eight) hours as needed for cough. (Patient not taking: Reported on 12/16/2016) 120 mL 0  . traZODone (DESYREL) 50 MG tablet Take 1 tablet (50 mg total) by mouth at bedtime as needed for sleep. (Patient not taking: Reported on 12/16/2016) 30 tablet 1   No current facility-administered medications for this encounter.     REVIEW OF SYSTEMS:   A 10+ POINT REVIEW OF SYSTEMS WAS OBTAINED including neurology, dermatology, psychiatry, cardiac, respiratory, lymph, extremities, GI, GU, Musculoskeletal, constitutional, HEENT.  All pertinent positives are noted in the HPI.  All others are negative.   PHYSICAL EXAM:  weight is 210 lb 9.6 oz (95.5 kg). His temperature is 98.5 F (36.9 C). His blood pressure is 141/93 (abnormal) and his pulse is 70. His oxygen saturation is 94%.   General: Alert and oriented, in no acute distress HEENT: Head is normocephalic. Extraocular movements are intact. Enlarged right tonsil which is touching the uvula. No evidence of ulceration or fungation. Tongue is midline, good mobility. No masses palpated in the tongue. He has some metallic work in his teeth (bridge left bottom). Mouth is slightly dry today. Neck: No palpable masses in cervical or supraclavicular neck. Heart: Regular in rate and rhythm. Systolic murmur detected at aortic region. Chest: Clear to auscultation bilaterally, with no rhonchi, wheezes, or rales. Abdomen: Soft, nontender, nondistended, with no rigidity or guarding. Extremities: No cyanosis or edema. Lymphatics: see Neck Exam Skin: No concerning lesions. Musculoskeletal: symmetric strength and muscle tone throughout. Neurologic: Cranial nerves II through XII are grossly intact. No obvious focalities. Speech is fluent. Coordination is  intact. Psychiatric: Judgment and insight are intact. Affect is appropriate.  ECOG = 0  0 - Asymptomatic (Fully active, able to carry on all predisease activities without restriction)  1 - Symptomatic but completely ambulatory (Restricted in physically strenuous activity but ambulatory and able to carry out work of a light or sedentary nature. For example, light housework, office work)  2 - Symptomatic, <50% in bed during the day (Ambulatory and capable of all self care but unable to carry out any work activities. Up and about more than 50% of waking  hours)  3 - Symptomatic, >50% in bed, but not bedbound (Capable of only limited self-care, confined to bed or chair 50% or more of waking hours)  4 - Bedbound (Completely disabled. Cannot carry on any self-care. Totally confined to bed or chair)  5 - Death   Eustace Pen MM, Creech RH, Tormey DC, et al. [phone]). "Toxicity and response criteria of the [facility]". [place] Oncol. 5 (6): 649-55   LABORATORY DATA:  Lab Results  Component Value Date   WBC 7.7 12/16/2016   HGB 16.0 12/16/2016   HCT 46.5 12/16/2016   MCV 90.1 12/16/2016   PLT 193 12/16/2016   CMP     Component Value Date/Time   NA 139 12/16/2016 1220   K 4.4 12/16/2016 1220   CL 99 04/16/2016 1139   CO2 24 12/16/2016 1220   GLUCOSE 96 12/16/2016 1220   BUN 20.3 12/16/2016 1220   CREATININE 1.2 12/16/2016 1220   CALCIUM 9.7 12/16/2016 1220   PROT 7.5 12/16/2016 1220   ALBUMIN 4.2 12/16/2016 1220   AST 22 12/16/2016 1220   ALT 30 12/16/2016 1220   ALKPHOS 156 (H) 12/16/2016 1220   BILITOT 0.80 12/16/2016 1220   GFRNONAA 78 04/16/2016 1139   GFRAA 90 04/16/2016 1139      Lab Results  Component Value Date   TSH 0.985 02/08/2013      RADIOGRAPHY: Ct Soft Tissue Neck W Contrast  Addendum Date: 11/18/2016   ADDENDUM REPORT: 11/18/2016 14:59 ADDENDUM: After further review and discussion with Dr. Erik Obey, there is fullness of the right palatine tonsil. Surrounding fat planes are ill-defined. This area is obscured by dental artifact. Possible right tonsillar carcinoma. Electronically Signed   By: Franchot Gallo M.D.   On: 11/18/2016 14:59   Result Date: 11/18/2016 CLINICAL DATA:  Rule out neoplasm tongue/ tonsil. History of left vocal cord polyp removed. EXAM: CT NECK WITH CONTRAST TECHNIQUE: Multidetector CT imaging of the neck was performed using the standard protocol following the bolus administration of intravenous contrast. CONTRAST:  66mL ISOVUE-300 IOPAMIDOL (ISOVUE-300) INJECTION 61%  COMPARISON:  CT neck 06/16/2014 FINDINGS: Pharynx and larynx: Normal tongue and nasopharynx. Epiglottis and vocal cords normal. 7 x 9 mm cyst in the right lateral pharyngeal wall. This has homogeneous water density aside from a small calcification posteriorly. This was not present on the prior CT. This is located just above the tip of the epiglottis. Salivary glands: No inflammation, mass, or stone. Thyroid: Negative Lymph nodes: Right level 2 node 12 mm. No other prominent lymph nodes in the neck. Vascular: Carotid artery calcification bilaterally. Carotid artery and jugular vein patent bilaterally. Limited intracranial: Negative Visualized orbits: Negative Mastoids and visualized paranasal sinuses: Negative Skeleton: Moderate cervical disc degeneration and spurring. No acute skeletal abnormality. Upper chest: Mild apical lung scarring on the right. Other: None IMPRESSION: Negative for tongue mass 7 x 9 mm cyst right lateral pharyngeal wall. Overall this has a  benign appearance. There is a small calcification layering dependently in the cyst. 12 mm right level 2 lymph node is prominent but not pathologic in size. No other prominent lymph nodes in the neck. Electronically Signed: By: Franchot Gallo M.D. On: 11/18/2016 07:27   Nm Pet Image Initial (pi) Skull Base To Thigh  Result Date: 12/02/2016 CLINICAL DATA:  Initial treatment strategy for tonsillar cancer. EXAM: NUCLEAR MEDICINE PET SKULL BASE TO THIGH TECHNIQUE: 10.8 mCi F-18 FDG was injected intravenously. Full-ring PET imaging was performed from the skull base to thigh after the radiotracer. CT data was obtained and used for attenuation correction and anatomic localization. FASTING BLOOD GLUCOSE:  Value: 105 mg/dl COMPARISON:  CT neck 11/17/2016 FINDINGS: NECK Mildly asymmetric tonsillar activity along the right tonsillar sinus, maximum SUV 7.7 as compared to the left side which measures 5.2. The cystic lesion along the right lateral pharyngeal wall is not  appreciably hypermetabolic. A right level IIa lymph node measures 1.2 cm in short axis on image 33/4 and has maximum SUV of 8.4, compatible with malignancy. Bilateral carotid atherosclerotic calcification. CHEST No hypermetabolic mediastinal or hilar nodes. No suspicious pulmonary nodules on the CT data. Paraseptal emphysema. Coronary, aortic arch, and branch vessel atherosclerotic vascular disease. Small type 1 hiatal hernia. ABDOMEN/PELVIS No abnormal hypermetabolic activity within the liver, pancreas, adrenal glands, or spleen. No hypermetabolic lymph nodes in the abdomen or pelvis. Faint accentuation of metabolic activity in the left lateral prostate gland apex, maximum SUV 4.5. Aortoiliac atherosclerotic vascular disease. Photopenic right kidney upper pole cyst. Left mid kidney cyst anteriorly. Diffuse colonic diverticulosis most severe in the sigmoid colon with associated wall thickening. Physiologic activity is observed in the bowel, especially the colon. No hypermetabolic adenopathy observed. SKELETON No focal hypermetabolic activity to suggest skeletal metastasis. Right hip prosthesis. Transitional lumbosacral vertebra. Unusual sternal angulation, query old fracture or pectus carinatum. IMPRESSION: 1. Mildly asymmetric tonsillar activity along the right tonsillar sinus, maximum SUV 7.7 as compared to the left side 5.2, suspicious in this setting for right-sided malignancy. There is also a mildly enlarged and hypermetabolic right level IIa lymph node with maximum SUV of 8.4. No evidence of other metastatic spread. 2. Faint accentuation of activity in the left lateral prostate gland apex, but low-grade. Correlate with PSA level in determining whether further workup is warranted. 3. Other imaging findings of potential clinical significance: Aortic Atherosclerosis (ICD10-I70.0) and Emphysema (ICD10-J43.9). Coronary atherosclerosis. Small type 1 hiatal hernia. Bilateral renal cysts. Colonic diverticulosis.  Electronically Signed   By: Van Clines M.D.   On: 12/02/2016 15:53   Mr Face/trigeminal Wo/w Cm  Result Date: 12/09/2016 CLINICAL DATA:  Tonsillar cancer.  Evaluation for perineural spread. Creatinine was obtained on site at [place] at [address] Results: Creatinine 1.3 mg/dL. EXAM: MRI FACE TRIGEMINAL WITHOUT AND WITH CONTRAST TECHNIQUE: Multiplanar, multiecho pulse sequences of the face and surrounding structures, including thin slice imaging of the course of the Trigeminal Nerves, were obtained both before and after administration of intravenous contrast. CONTRAST:  62mL MULTIHANCE GADOBENATE DIMEGLUMINE 529 MG/ML IV SOLN COMPARISON:  None. FINDINGS: Visualized brain: Normal parenchymal signal. No extra-axial collection. Orbits: Normal optic nerves and extra-axial muscles. The globes are intact. Skull base: Meckel's cave is normal bilaterally. No abnormal enhancement in foramina rotundum or ovale. No abnormality of the internal auditory canals or jugular foramina. No abnormality at the hypoglossal canal. Paranasal sinuses and mastoids: Clear. Aerodigestive tract: There is an unchanged cystic spaces along the right wall of the pharynx.  There is mild asymmetry of the palatine tonsils but no clear mass. IMPRESSION: 1. No evidence of perineural spread of malignancy. 2. Mild asymmetry of the palatine tonsils without discrete mass. Correlate with direct visualization. Electronically Signed   By: Ulyses Jarred M.D.   On: 12/09/2016 18:54      IMPRESSION/PLAN: This is a delightful patient with head and neck cancer. I  Recommend 7 wks of radiotherapy for this patient. I am also recommending that the patient have PSA checked with PCP though this is not urgent due to some nonspecific findings on his PET.  We discussed the potential risks, benefits, and side effects of radiotherapy. We talked in detail about acute and late effects. We discussed that some of the most bothersome acute  effects may be mucositis, dysgeusia, salivary changes, skin irritation, hair loss, dehydration, weight loss and fatigue. We talked about late effects which include but are not necessarily limited to dysphagia, hypothyroidism, nerve injury, spinal cord injury, xerostomia, trismus, and neck edema. No guarantees of treatment were given. A consent form was signed and placed in the patient's medical record. The patient is enthusiastic about proceeding with treatment. I look forward to participating in the patient's care.    Simulation (treatment planning) will take place once released by Dentistry.  Discussed patient with Dr Enrique Sack today.  We also discussed that the treatment of head and neck cancer is a multidisciplinary process to maximize treatment outcomes and quality of life. For this reasons the following referrals have been or will be made:   Medical oncology to discuss chemotherapy    Scheduled to meet with Dr. Enrique Sack today   Nutritionist for nutrition support during and after treatment.   Possible G tube placement - pending medical oncology appt. I will defer to med/onc on ordering this and portacath.   Speech language pathology for swallowing and/or speech therapy.   Social work for social support.    Physical therapy due to risk of lymphedema in neck and deconditioning.   Baseline labs including TSH.   Recommended patient  discussing systolic murmur with PCP __________________________________________   Eppie Gibson, MD   This document serves as a record of services personally performed by Eppie Gibson, MD. It was created on his behalf by Linward Natal, a trained medical scribe. The creation of this record is based on the scribe's personal observations and the provider's statements to them. This document has been checked and approved by the attending provider.

## 2016-12-16 NOTE — Telephone Encounter (Addendum)
Oncology Nurse Navigator Documentation  Patient dtr called, I provided clarification of upcoming appts for PAC/PEG placement. We discussed arrival to Radiation Waiting for PEG education following 11/25 10:00 Dental Medicine appt and prior to [place] class.  Gayleen Orem, RN, BSN, [place] [facility] Oncology Nurse [place] [facility] [place] [phone]

## 2016-12-16 NOTE — Progress Notes (Signed)
DENTAL CONSULTATION  Date of Consultation:  12/16/2016 Patient Name:   Terry Pacheco Date of Birth:   [birth year] Medical Record Number: [id number]  VITALS: BP 137/74 (BP Location: Left Arm)   Pulse 60   Temp 98.3 F (36.8 C) (Oral)   CHIEF COMPLAINT: Patient referred by Dr. Isidore Moos for a dental consultation.  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 Patient with anticipated chemoradiation therapy. Patient is now seen as part of a medically necessary pre-chemoradiation therapy dental protocol examination.  The patient currently denies acute toothaches, swellings, or abscesses. Patient last seen for an exam and cleaning on 07/29/2016 by Dr. Ephraim Hamburger in [place], [place]. Patient is usually seen on an every 6 month basis. Patient does take antibiotic premedication prior to dental treatment due to his previous total hip replacement. Patient denies having any partial dentures. Patient denies having dental phobia.  PROBLEM LIST: Patient Active Problem List   Diagnosis Date Noted  . 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 Osteoarthritis of right hip 04/05/2015  . GAD (generalized anxiety disorder) 04/05/2015  . Metabolic syndrome 56/43/3295  . Obese 04/05/2015  . GERD (gastroesophageal reflux disease) 07/06/2014  . Hypertension 03/14/2013  . Hyperlipidemia with target LDL less than 100 03/14/2013    PMH: Past Medical History:  Diagnosis Date  . Arthritis   . GERD (gastroesophageal reflux disease) 07/06/2014  . Hypertension   . Sleep apnea    does not use CPAP  . Wears glasses     PSH: Past Surgical History:  Procedure Laterality Date  . HERNIA REPAIR    . polyp removal  2008   during colonoscopy  . TOTAL HIP ARTHROPLASTY Right 10/16/2015   Procedure: RIGHT TOTAL HIP ARTHROPLASTY ANTERIOR APPROACH;  Surgeon: Paralee Cancel, MD;  Location: [facility];  Service: Orthopedics;  Laterality: Right;  .  vocal cord polyp      removed 2-3 years ago     ALLERGIES: Allergies  Allergen Reactions  . Crestor [Rosuvastatin]     Muscle aches    MEDICATIONS: Current Outpatient Prescriptions  Medication Sig Dispense Refill  . amLODipine (NORVASC) 10 MG tablet TAKE 1 TABLET EVERY DAY FOR FOR BLOOD PRESSURE 90 tablet 0  . atorvastatin (LIPITOR) 40 MG tablet TAKE 1 TABLET BY MOUTH EVERY DAY 90 tablet 0  . escitalopram (LEXAPRO) 20 MG tablet TAKE 0.5 TABLETS (10 MG TOTAL) BY MOUTH DAILY. 45 tablet 0  . HYDROcodone-homatropine (HYCODAN) 5-1.5 MG/5ML syrup Take 5 mLs by mouth every 8 (eight) hours as needed for cough. (Patient not taking: Reported on 12/16/2016) 120 mL 0  . Multiple Vitamin (MULTIVITAMIN) tablet Take 1 tablet by mouth daily.    Marland Kitchen omeprazole (PRILOSEC) 20 MG capsule TAKE 1 CAPSULE BY MOUTH EVERY DAY 90 capsule 0  . traZODone (DESYREL) 50 MG tablet Take 1 tablet (50 mg total) by mouth at bedtime as needed for sleep. 30 tablet 1  . TURMERIC PO Take 1,200 mg by mouth.     No current facility-administered medications for this visit.     LABS: Lab Results  Component Value Date   WBC 7.7 12/16/2016   HGB 16.0 12/16/2016   HCT 46.5 12/16/2016   MCV 90.1 12/16/2016   PLT 193 12/16/2016      Component Value Date/Time   NA 139 12/16/2016 1220   K 4.4 12/16/2016 1220   CL 99 04/16/2016 1139   CO2 24 12/16/2016 1220  GLUCOSE 96 12/16/2016 1220   BUN 20.3 12/16/2016 1220   CREATININE 1.2 12/16/2016 1220   CALCIUM 9.7 12/16/2016 1220   GFRNONAA 78 04/16/2016 1139   GFRAA 90 04/16/2016 1139   Lab Results  Component Value Date   INR 1.00 (L) 12/16/2016   INR 0.95 10/10/2015   PROTIME 12.0 12/16/2016   No results found for: PTT  SOCIAL HISTORY: Social History   Social History  . Marital status: Married    Spouse name: N/A  . Number of children: N/A  . Years of education: N/A   Occupational History  . Not on file.   Social History Main Topics  . Smoking status:  Former Smoker    Packs/day: 0.50    Years: 45.00    Types: Cigarettes    Start date: 01/12/1983    Quit date: 11/17/2016  . Smokeless tobacco: Never Used  . Alcohol use 0.0 oz/week     Comment: rare  . Drug use: Yes    Types: Marijuana     Comment: last use 10/09/2015  . Sexual activity: Not on file   Other Topics Concern  . Not on file   Social History Narrative  . No narrative on file    FAMILY HISTORY: Family History  Problem Relation Age of Onset  . Heart disease Mother   . Stroke Father     REVIEW OF SYSTEMS: Reviewed with the patient as per History of present illness. Psych: Patient denies having dental phobia.  DENTAL HISTORY: CHIEF COMPLAINT: Patient referred by Dr. Isidore Moos for a dental consultation.  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 Patient with anticipated chemoradiation therapy. Patient is now seen as part of a medically necessary pre-chemoradiation therapy dental protocol examination.  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 Patient with anticipated chemoradiation therapy. Patient is now seen as part of a medically necessary pre-chemoradiation therapy dental protocol examination.  The patient currently denies acute toothaches, swellings, or abscesses. Patient last seen for an exam and cleaning on 07/29/2016 by Dr. Ephraim Hamburger in [place], [place]. Patient is usually seen on an every 6 month basis. Patient does take antibiotic premedication prior to dental treatment due to his previous total hip replacement. Patient denies having any partial dentures. Patient denies having dental phobia.   DENTAL EXAMINATION: GENERAL: The patient is a well-developed, well-nourished male in no acute distress. HEAD AND NECK: There is no palpable neck lymphadenopathy. The patient denies acute TMJ symptoms. INTRAORAL EXAM: The patient  has incipient xerostomia. There is no evidence of oral abscess formation.The patient has bilateral mandibular lingual tori. DENTITION: Patient is missing tooth numbers 1, 2, 15, 16, 19, 31, and 32. Tooth #19 and has been replaced with a bridge. PERIODONTAL: The patient has chronic periodontitis with plaque and calculus accumulations, selective areas of gingival recession, and incipient to moderate bone loss. No significant tooth mobility is noted. Periodontal charting was deferred secondary to need for antibiotic premedication for the previous total hip replacement. DENTAL CARIES/SUBOPTIMAL RESTORATIONS: No obvious dental caries are noted. Flexure lesions are noted. ENDODONTIC: Patient currently denies acute pulpitis symptoms. Patient has a previous root canal therapy associated with tooth #30 with no obvious persistent periapical pathology or radiolucency. CROWN AND BRIDGE: Patient has multiple crowns on tooth numbers 3, 12, and 30. There is a bridge from tooth numbers 18 through 20. PROSTHODONTIC: Patient denies having partial  dentures. OCCLUSION: Patient has a poor occlusal scheme but a stable occlusion.  RADIOGRAPHIC INTERPRETATION: An orthopantogram was taken today and supplemented with a full series of dental radiographs. There are multiple missing teeth. There is incipient to moderate bone loss. No obvious periapical radiolucencies are noted. Multiple dental restorations are noted. Multiple crown and bridge restorations are noted. There is a previous root canal therapy associated with tooth #30.   ASSESSMENTS: 1. Squamous cell carcinoma of the right tonsil 2. Pre-chemoradiation therapy dental protocol 3. Chronic periodontitis with bone loss 4. Gingival recession 5. Accretions 6. No significant tooth mobility 7. Multiple missing teeth 8. Poor occlusal scheme but a stable occlusion 9. Status post total hip replacement in August 2017 with Dr. Alvan Dame with need for antibiotic premedication  prior to invasive dental procedures.     PLAN/RECOMMENDATIONS: 1. I discussed the risks, benefits, and complications of various treatment options with the patient in relationship to his medical and dental conditions, anticipated chemoradiation therapy, and chemoradiation therapy side effects to include xerostomia, radiation caries, trismus, mucositis, taste changes, gum and jawbone changes, and risk for infection and osteoradionecrosis. We discussed various treatment options to include no treatment, extraction of teeth in the primary field radiation therapy,  pre-prosthetic surgery as indicated, periodontal therapy, dental restorations, root canal therapy, crown and bridge therapy, implant therapy, and replacement of missing teeth as indicated. After a discussion with Dr. Isidore Moos, it was determined that the maxillary right and mandibular right first molars as well as tooth number 17 will be kept below 5000 cGy with minimal risk for osteoradionecrosis in the future. The patient currently wishes to proceed with impressions today for the fabrication of fluoride trays and scatter protection devices. A prescription for FluoriSHIELD will be sent to [facility] with refills for one year. Patient is to contact Dr. Ephraim Hamburger concerning a dental cleaning prior to start of chemoradiation therapy as time and space permits.   2. Discussion of findings with medical team and coordination of future medical and dental care as needed.  I spent in excess of  120 minutes during the conduct of this consultation and >50% of this time involved direct face-to-face encounter for counseling and/or coordination of the patient's care.    Lenn Cal, DDS

## 2016-12-17 ENCOUNTER — Other Ambulatory Visit: Payer: Self-pay | Admitting: Radiation Oncology

## 2016-12-17 DIAGNOSIS — Z1329 Encounter for screening for other suspected endocrine disorder: Secondary | ICD-10-CM

## 2016-12-17 DIAGNOSIS — C090 Malignant neoplasm of tonsillar fossa: Secondary | ICD-10-CM | POA: Insufficient documentation

## 2016-12-17 LAB — APTT: aPTT: 29 s (ref 24–33)

## 2016-12-17 LAB — PSA: PROSTATE SPECIFIC AG, SERUM: 0.9 ng/mL (ref 0.0–4.0)

## 2016-12-17 LAB — PHOSPHORUS: PHOSPHORUS: 4.6 mg/dL — AB (ref 2.5–4.5)

## 2016-12-17 NOTE — Progress Notes (Signed)
Error, duplicate

## 2016-12-18 ENCOUNTER — Encounter: Payer: Self-pay | Admitting: *Deleted

## 2016-12-18 ENCOUNTER — Other Ambulatory Visit: Payer: 59

## 2016-12-18 ENCOUNTER — Ambulatory Visit (HOSPITAL_COMMUNITY)
Admission: RE | Admit: 2016-12-18 | Discharge: 2016-12-18 | Disposition: A | Discharge status: Home / Self Care | Payer: 59 | Source: Ambulatory Visit | Attending: Hematology | Admitting: Hematology

## 2016-12-18 ENCOUNTER — Encounter (HOSPITAL_COMMUNITY): Payer: Self-pay | Admitting: Dentistry

## 2016-12-18 ENCOUNTER — Ambulatory Visit (HOSPITAL_COMMUNITY): Payer: Self-pay | Admitting: Dentistry

## 2016-12-18 ENCOUNTER — Telehealth: Payer: Self-pay | Admitting: *Deleted

## 2016-12-18 VITALS — BP 149/83 | HR 61 | Temp 97.8°F

## 2016-12-18 DIAGNOSIS — E785 Hyperlipidemia, unspecified: Secondary | ICD-10-CM | POA: Insufficient documentation

## 2016-12-18 DIAGNOSIS — I371 Nonrheumatic pulmonary valve insufficiency: Secondary | ICD-10-CM | POA: Diagnosis not present

## 2016-12-18 DIAGNOSIS — Z01818 Encounter for other preprocedural examination: Secondary | ICD-10-CM

## 2016-12-18 DIAGNOSIS — I071 Rheumatic tricuspid insufficiency: Secondary | ICD-10-CM | POA: Diagnosis not present

## 2016-12-18 DIAGNOSIS — I10 Essential (primary) hypertension: Secondary | ICD-10-CM | POA: Insufficient documentation

## 2016-12-18 DIAGNOSIS — C090 Malignant neoplasm of tonsillar fossa: Secondary | ICD-10-CM | POA: Diagnosis not present

## 2016-12-18 DIAGNOSIS — Z463 Encounter for fitting and adjustment of dental prosthetic device: Secondary | ICD-10-CM

## 2016-12-18 DIAGNOSIS — C099 Malignant neoplasm of tonsil, unspecified: Secondary | ICD-10-CM

## 2016-12-18 MED ORDER — SODIUM FLUORIDE 1.1 % DT GEL: DENTAL | 99 refills | Status: DC

## 2016-12-18 MED FILL — FLUORISHIELD 1.1% GEL: 1.1 % | 30 days supply | Qty: 114 | Fill #0

## 2016-12-18 NOTE — Telephone Encounter (Signed)
Oncology Nurse Navigator Documentation  Spoke with Edwinna Areola, [facility], requested PEG supply kit to be delivered to [facility] 11[facility].  She voiced understanding.  Gayleen Orem, RN, BSN, [place] Neck Oncology Nurse [place] at [place] [phone]

## 2016-12-18 NOTE — Progress Notes (Signed)
12/18/2016  Patient Name:   Terry Pacheco Date of Birth:   06/10/1950 Medical Record Number: [id number]  BP (!) 149/83 (BP Location: Left Arm)   Pulse 61   Temp 97.8 F (36.6 C) (Oral)   [name] now presents for insertion of upper and lower fluoride trays and scatter protection devices.  PROCEDURE: Appliances were tried in and adjusted as needed. [place] Trismus device was fabricated at 30 mm using 18 sticks. Postop instructions were provided and a written and verbal format concerning the use and care of appliances. Prescription for FluoriSHIELD was sent to [facility] with refills for one year. Normal directions. All questions were answered. Patient to return to clinic for periodic oral examination in approximately 2-3weeks during radiation therapy. Patient to call if questions or problems arise before then.  Lenn Cal, DDS

## 2016-12-18 NOTE — Progress Notes (Signed)
Oncology Nurse Navigator Documentation  Met with Terry Pacheco and his wife in Radiation Nursing to provide PEG education prior to 11/1 placement. Using  PEG teaching device   and Teach Back, provided education for PEG use and care, including: hand hygiene, gravity bolus administration of daily water flushes and nutritional supplement, fluids and medications; care of tube insertion site including daily dressing change and cleaning; S&S of infection.  Patient and wife correctly verbalized dressing change and cleaning procedures, provided correct return demonstration of dressing change and gravity administration of water.  I provided written instructions for PEG flushing/dressing change in support of verbal instruction.  Explained contents of PEG starter kit to be delivered to [facility].  They verbalized understanding I will be available for ongoing PEG support.  Gayleen Orem, RN, BSN, [place] Neck Oncology Nurse [place] at [place] [phone]

## 2016-12-18 NOTE — Patient Instructions (Addendum)
1. Brush teeth after meals and at bedtime. Floss at bedtime. 2. Use fluoride at bedtime as instructed with the fluoride trays. 3. Pick a prescription for FluoriSHIELD at [facility] with refills for one year. 4. Return to clinic in 2-3 weeks for oral examination during radiation therapy. Call as problems arise.  Dr. Enrique Sack  FLUORIDE TRAYS PATIENT INSTRUCTIONS    Obtain prescription from the pharmacy.  Don't be surprised if it needs to be ordered.  Be sure to let the pharmacy know when you are close to needing a new refill for them to have it ready for you without interruption of Fluoride use.  The best time to use your Fluoride is before bed time.  You must brush your teeth very well and floss before using the Fluoride in order to get the best use out of the Fluoride treatments.  Place 1 drop of Fluoride gel per tooth in the tray.  Place the tray on your lower teeth and your upper teeth.  Make sure the trays are seated all the way.  Remember, they only fit one way on your teeth.  Insert for 5 full minutes.  At the end of the 5 minutes, take the trays out.  SPIT OUT excess. .  Do NOT rinse your mouth!  Do NOT eat or drink after treatments for at least 30 minutes.  This is why the best time for your treatments is before bedtime.  Clean the inside of your Fluoride trays using COLD WATER and a toothbrush.  In order to keep your Trays from discoloring and free from odors, soak them overnight in denture cleaners such as Efferdent.  Do not use bleach or non denture products.  Store the trays in a safe dry place AWAY from any heat until your next treatment.  If anything happens to your Fluoride trays, or they don't fit as well after any dental work, please let us know as soon as possible.

## 2016-12-18 NOTE — Progress Notes (Signed)
  Echocardiogram 2D Echocardiogram has been performed.  Terry Pacheco 12/18/2016, 9:38 AM

## 2016-12-19 ENCOUNTER — Encounter: Payer: Self-pay | Admitting: *Deleted

## 2016-12-19 ENCOUNTER — Ambulatory Visit
Admission: RE | Admit: 2016-12-19 | Discharge: 2016-12-19 | Disposition: A | Discharge status: Home / Self Care | Payer: 59 | Source: Ambulatory Visit | Attending: Radiation Oncology | Admitting: Radiation Oncology

## 2016-12-19 VITALS — BP 134/78 | HR 75 | Temp 98.0°F | Wt 209.8 lb

## 2016-12-19 DIAGNOSIS — C090 Malignant neoplasm of tonsillar fossa: Secondary | ICD-10-CM

## 2016-12-19 DIAGNOSIS — Z510 Encounter for antineoplastic radiation therapy: Secondary | ICD-10-CM | POA: Diagnosis not present

## 2016-12-19 MED ORDER — SODIUM CHLORIDE 0.9 % IJ SOLN
10.0000 mL | Freq: Once | INTRAMUSCULAR | Status: AC
Start: 2016-12-19 — End: 2016-12-19
  Administered 2016-12-19: 10 mL via INTRAVENOUS

## 2016-12-19 NOTE — Progress Notes (Signed)
Head and Neck Cancer Simulation, IMRT treatment planning, and Special treatment procedure note   Outpatient  Diagnosis:    ICD-10-CM   1. Carcinoma of tonsillar fossa ([place]) C09.0     The patient was taken to the CT simulator and laid in the supine position on the table. An Aquaplast head and shoulder mask was custom fitted to the patient's anatomy. High-resolution CT axial imaging was obtained of the head and neck with contrast. I verified that the quality of the imaging is good for treatment planning. 1 Medically Necessary Treatment Device was fabricated and supervised by me: Aquaplast mask.   Treatment planning note I plan to treat the patient with IMRT. I plan to treat the patient's tumor and bilateral neck nodes. I plan to treat to a total dose of 70 Gray in 35  fractions. Dose calculation was ordered from dosimetry.  IMRT planning Note  IMRT is medically necessary and an important modality to deliver adequate dose to the patient's at risk tissues while sparing the patient's normal structures, including the: esophagus, parotid tissue, mandible, brain stem, spinal cord, oral cavity, brachial plexus.  This justifies the use of IMRT in the patient's treatment.   Special Treatment Procedure Note:  The patient will be receiving chemotherapy concurrently. Chemotherapy heightens the risk of side effects. I have considered this during the patient's treatment planning process and will monitor the patient accordingly for side effects on a weekly basis. Concurrent chemotherapy increases the complexity of this patient's treatment and therefore this constitutes a special treatment procedure.  -----------------------------------  Eppie Gibson, MD

## 2016-12-19 NOTE — Progress Notes (Signed)
Oncology Nurse Navigator Documentation  Met with Mr. Torbert and his wife after CT SIM. He tolerated procedure without difficult, denied concerns. Provided tour of [facility] treatment area, explained registration/arrival procedures. They were encouraged to call me with questions/concerns.  Gayleen Orem, RN, BSN, [place] Neck Oncology Nurse [place] at [place] [phone]

## 2016-12-19 NOTE — Progress Notes (Signed)
.  nurse

## 2016-12-19 NOTE — Progress Notes (Signed)
Has armband been applied?  Yes.    Does patient have an allergy to IV contrast dye?: No.   Has patient ever received premedication for IV contrast dye?: No.   Does patient take metformin?: No.  If patient does take metformin when was the last dose: n/a  Date of lab work: December 16, 2016 BUN: 20.3  CR: 1.2  IV site: antecubital right, condition patent and no redness  Has IV site been added to flowsheet?  Yes.    There were no vitals taken for this visit.

## 2016-12-22 ENCOUNTER — Other Ambulatory Visit: Payer: Self-pay | Admitting: *Deleted

## 2016-12-22 ENCOUNTER — Encounter: Payer: Self-pay | Admitting: Hematology

## 2016-12-22 DIAGNOSIS — C090 Malignant neoplasm of tonsillar fossa: Secondary | ICD-10-CM

## 2016-12-22 DIAGNOSIS — Z7189 Other specified counseling: Secondary | ICD-10-CM | POA: Insufficient documentation

## 2016-12-22 MED ORDER — ONDANSETRON HCL 8 MG PO TABS: 8.0000 mg | ORAL_TABLET | Freq: Two times a day (BID) | ORAL | 1 refills | Status: DC | PRN

## 2016-12-22 MED ORDER — PROCHLORPERAZINE MALEATE 10 MG PO TABS: 10.0000 mg | ORAL_TABLET | Freq: Four times a day (QID) | ORAL | 1 refills | Status: DC | PRN

## 2016-12-22 MED ORDER — DEXAMETHASONE 4 MG PO TABS: ORAL_TABLET | ORAL | 1 refills | Status: DC

## 2016-12-22 NOTE — Progress Notes (Signed)
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 AJCC M Category: M0 Intent of Therapy: Curative Intent, Discussed with Patient

## 2016-12-23 ENCOUNTER — Other Ambulatory Visit: Payer: Self-pay | Admitting: Radiology

## 2016-12-23 DIAGNOSIS — H9313 Tinnitus, bilateral: Secondary | ICD-10-CM | POA: Diagnosis not present

## 2016-12-23 DIAGNOSIS — H903 Sensorineural hearing loss, bilateral: Secondary | ICD-10-CM | POA: Diagnosis not present

## 2016-12-24 ENCOUNTER — Telehealth: Payer: Self-pay | Admitting: Hematology

## 2016-12-24 DIAGNOSIS — Z510 Encounter for antineoplastic radiation therapy: Secondary | ICD-10-CM | POA: Diagnosis not present

## 2016-12-24 NOTE — Telephone Encounter (Signed)
Spoke to patients wife regarding updates to schedule per 10/26 sch message.

## 2016-12-25 ENCOUNTER — Other Ambulatory Visit: Payer: Self-pay | Admitting: Hematology

## 2016-12-25 ENCOUNTER — Other Ambulatory Visit: Payer: Self-pay

## 2016-12-25 ENCOUNTER — Encounter (HOSPITAL_COMMUNITY): Payer: Self-pay

## 2016-12-25 ENCOUNTER — Encounter: Payer: Self-pay | Admitting: *Deleted

## 2016-12-25 ENCOUNTER — Ambulatory Visit (HOSPITAL_COMMUNITY)
Admission: RE | Admit: 2016-12-25 | Discharge: 2016-12-25 | Disposition: A | Discharge status: Home / Self Care | Payer: 59 | Source: Ambulatory Visit | Attending: Hematology | Admitting: Hematology

## 2016-12-25 DIAGNOSIS — I10 Essential (primary) hypertension: Secondary | ICD-10-CM | POA: Diagnosis not present

## 2016-12-25 DIAGNOSIS — C099 Malignant neoplasm of tonsil, unspecified: Secondary | ICD-10-CM

## 2016-12-25 DIAGNOSIS — Z87891 Personal history of nicotine dependence: Secondary | ICD-10-CM | POA: Diagnosis not present

## 2016-12-25 DIAGNOSIS — G4730 Sleep apnea, unspecified: Secondary | ICD-10-CM | POA: Diagnosis not present

## 2016-12-25 DIAGNOSIS — Z452 Encounter for adjustment and management of vascular access device: Secondary | ICD-10-CM | POA: Diagnosis not present

## 2016-12-25 DIAGNOSIS — K219 Gastro-esophageal reflux disease without esophagitis: Secondary | ICD-10-CM | POA: Diagnosis not present

## 2016-12-25 DIAGNOSIS — C090 Malignant neoplasm of tonsillar fossa: Secondary | ICD-10-CM | POA: Diagnosis not present

## 2016-12-25 HISTORY — PX: IR GASTROSTOMY TUBE MOD SED: IMG625

## 2016-12-25 HISTORY — PX: IR US GUIDE VASC ACCESS RIGHT: IMG2390

## 2016-12-25 HISTORY — PX: IR FLUORO GUIDE PORT INSERTION RIGHT: IMG5741

## 2016-12-25 LAB — CBC WITH DIFFERENTIAL/PLATELET
Basophils Absolute: 0 10*3/uL (ref 0.0–0.1)
Basophils Relative: 1 %
Eosinophils Absolute: 0.5 10*3/uL (ref 0.0–0.7)
Eosinophils Relative: 7 %
HEMATOCRIT: 43.8 % (ref 39.0–52.0)
HEMOGLOBIN: 15.3 g/dL (ref 13.0–17.0)
LYMPHS ABS: 2.2 10*3/uL (ref 0.7–4.0)
LYMPHS PCT: 30 %
MCH: 30.8 pg (ref 26.0–34.0)
MCHC: 34.9 g/dL (ref 30.0–36.0)
MCV: 88.1 fL (ref 78.0–100.0)
MONO ABS: 0.7 10*3/uL (ref 0.1–1.0)
MONOS PCT: 9 %
NEUTROS ABS: 3.9 10*3/uL (ref 1.7–7.7)
NEUTROS PCT: 53 %
Platelets: 199 10*3/uL (ref 150–400)
RBC: 4.97 MIL/uL (ref 4.22–5.81)
RDW: 12.8 % (ref 11.5–15.5)
WBC: 7.3 10*3/uL (ref 4.0–10.5)

## 2016-12-25 LAB — PROTIME-INR
INR: 0.99
Prothrombin Time: 13 seconds (ref 11.4–15.2)

## 2016-12-25 MED ORDER — LIDOCAINE HCL 1 % IJ SOLN
INTRAMUSCULAR | Status: DC | PRN
  Administered 2016-12-25: 10 mL

## 2016-12-25 MED ORDER — CEFAZOLIN SODIUM-DEXTROSE 2-4 GM/100ML-% IV SOLN
INTRAVENOUS | Status: AC
  Administered 2016-12-25: 14:00:00
  Filled 2016-12-25: qty 100

## 2016-12-25 MED ORDER — IOPAMIDOL (ISOVUE-300) INJECTION 61%: 50.0000 mL | Freq: Once | INTRAVENOUS | Status: DC | PRN

## 2016-12-25 MED ORDER — ONDANSETRON HCL 4 MG/2ML IJ SOLN
4.0000 mg | INTRAMUSCULAR | Status: DC | PRN
Start: 2016-12-25 — End: 2016-12-26

## 2016-12-25 MED ORDER — CEFAZOLIN SODIUM-DEXTROSE 2-4 GM/100ML-% IV SOLN
2.0000 g | INTRAVENOUS | Status: AC
  Administered 2016-12-25: 2 g via INTRAVENOUS

## 2016-12-25 MED ORDER — FENTANYL CITRATE (PF) 100 MCG/2ML IJ SOLN
INTRAMUSCULAR | Status: AC | PRN
  Administered 2016-12-25: 50 ug via INTRAVENOUS

## 2016-12-25 MED ORDER — SODIUM CHLORIDE 0.9 % IV SOLN
INTRAVENOUS | Status: DC
  Administered 2016-12-25: 12:00:00 via INTRAVENOUS

## 2016-12-25 MED ORDER — LIDOCAINE-PRILOCAINE 2.5-2.5 % EX CREA: 1.0000 "application " | TOPICAL_CREAM | CUTANEOUS | 6 refills | Status: DC | PRN

## 2016-12-25 MED ORDER — MIDAZOLAM HCL 2 MG/2ML IJ SOLN
INTRAMUSCULAR | Status: AC | PRN
Start: 2016-12-25 — End: 2016-12-25
  Administered 2016-12-25: 1 mg via INTRAVENOUS
  Administered 2016-12-25: 0.5 mg via INTRAVENOUS
  Administered 2016-12-25: 1 mg via INTRAVENOUS

## 2016-12-25 MED ORDER — IOPAMIDOL (ISOVUE-300) INJECTION 61%
INTRAVENOUS | Status: AC
  Administered 2016-12-25: 20 mL
  Filled 2016-12-25: qty 50

## 2016-12-25 MED ORDER — HYDROCODONE-ACETAMINOPHEN 5-325 MG PO TABS
1.0000 | ORAL_TABLET | ORAL | Status: DC | PRN
  Administered 2016-12-25: 1 via ORAL
  Filled 2016-12-25: qty 1

## 2016-12-25 MED ORDER — GLUCAGON HCL RDNA (DIAGNOSTIC) 1 MG IJ SOLR
INTRAMUSCULAR | Status: AC
  Filled 2016-12-25: qty 1

## 2016-12-25 MED ORDER — LIDOCAINE-EPINEPHRINE (PF) 2 %-1:200000 IJ SOLN
INTRAMUSCULAR | Status: AC | PRN
  Administered 2016-12-25: 10 mL

## 2016-12-25 MED ORDER — LIDOCAINE HCL 1 % IJ SOLN
INTRAMUSCULAR | Status: AC | PRN
  Administered 2016-12-25: 10 mL

## 2016-12-25 MED ORDER — HEPARIN SOD (PORK) LOCK FLUSH 100 UNIT/ML IV SOLN
INTRAVENOUS | Status: AC
  Administered 2016-12-25: 500 [IU]
  Filled 2016-12-25: qty 5

## 2016-12-25 MED ORDER — MIDAZOLAM HCL 2 MG/2ML IJ SOLN
INTRAMUSCULAR | Status: AC
  Filled 2016-12-25: qty 4

## 2016-12-25 MED ORDER — LIDOCAINE HCL (PF) 1 % IJ SOLN
INTRAMUSCULAR | Status: AC
  Filled 2016-12-25: qty 30

## 2016-12-25 MED ORDER — MIDAZOLAM HCL 2 MG/2ML IJ SOLN
INTRAMUSCULAR | Status: AC | PRN
  Administered 2016-12-25: 1 mg via INTRAVENOUS

## 2016-12-25 MED ORDER — LIDOCAINE-EPINEPHRINE (PF) 2 %-1:200000 IJ SOLN
INTRAMUSCULAR | Status: AC
  Filled 2016-12-25: qty 20

## 2016-12-25 MED ORDER — FENTANYL CITRATE (PF) 100 MCG/2ML IJ SOLN
INTRAMUSCULAR | Status: AC
  Filled 2016-12-25: qty 4

## 2016-12-25 MED ORDER — FENTANYL CITRATE (PF) 100 MCG/2ML IJ SOLN
INTRAMUSCULAR | Status: AC | PRN
  Administered 2016-12-25: 25 ug via INTRAVENOUS
  Administered 2016-12-25 (×2): 50 ug via INTRAVENOUS

## 2016-12-25 MED FILL — ONDANSETRON HCL 8 MG TAB: 8 | 15 days supply | Qty: 30 | Fill #0

## 2016-12-25 MED FILL — PROCHLORPERAZINE 10 MG TAB: 10 | 7 days supply | Qty: 30 | Fill #0

## 2016-12-25 MED FILL — LIDOCAINE-PRILOCAINE CREAM: 2.5-2.5 | 10 days supply | Qty: 30 | Fill #0

## 2016-12-25 MED FILL — HYDROCODON-APAP 5-325: 5-325 | 2 days supply | Qty: 15 | Fill #0

## 2016-12-25 MED FILL — DEXAMETHASONE 4 MG TABLET: 4 | 7 days supply | Qty: 30 | Fill #0

## 2016-12-25 NOTE — Sedation Documentation (Signed)
Patient is resting comfortably. 

## 2016-12-25 NOTE — Progress Notes (Signed)
Oncology Nurse Navigator Documentation  Visited Terry Pacheco s/p PEG/PAC placement.  His wife and sister were bedside. Review contents of I reviewed contents of Start of Care Bolus Feeding Kit provided by [facility] (6 syringes, box of drain sponges, box 4x4 split gauze, 3 rolls paper tape).  Provided package of mesh briefs, reviewed use for securing PEG as an alternative to tape.  Reviewed medications awaiting pick-up at [facility].  Wife requested transfer to [place], I indicated I would arrange. I provided Epic appt calendar for current appts through December. Encouraged ton call me with questions/concerns.  Gayleen Orem, RN, BSN, [place] Neck Oncology Nurse [place] at [place] [phone]

## 2016-12-25 NOTE — Procedures (Signed)
Successful placement of right jugular port, tip at SVC/RA junction.  Successful placement of 20 fr gastrostomy tube. Minimal blood loss  No immediate complication.

## 2016-12-25 NOTE — Discharge Instructions (Signed)
° ° ° ° °Gastrostomy Tube Home Guide, Adult °A gastrostomy tube is a tube that is surgically placed into the stomach. It is also called a “G-tube.” G-tubes are used when a person is unable to eat and drink enough on their own to stay healthy. The tube is inserted into the stomach through a small cut (incision) in the skin. This tube is used for: °· Feeding. °· Giving medication. ° °Gastrostomy tube care °· Wash your hands with soap and water. °· Remove the old dressing (if any). Some styles of G-tubes may need a dressing inserted between the skin and the G-tube. Other types of G-tubes do not require a dressing. Ask your health care provider if a dressing is needed. °· Check the area where the tube enters the skin (insertion site) for redness, swelling, or pus-like (purulent) drainage. A small amount of clear or tan liquid drainage is normal. Check to make sure scar tissue (skin) is not growing around the insertion site. This could have a raised, bumpy appearance. °· A cotton swab can be used to clean the skin around the tube: °? When the G-tube is first put in, a normal saline solution or water can be used to clean the skin. °? Mild soap and warm water can be used when the skin around the G-tube site has healed. °? Roll the cotton swab around the G-tube insertion site to remove any drainage or crusting at the insertion site. °Stomach residuals °Feeding tube residuals are the amount of liquids that are in the stomach at any given time. Residuals may be checked before giving feedings, medications, or as instructed by your health care provider. °· Ask your health care provider if there are instances when you would not start tube feedings depending on the amount or type of contents withdrawn from the stomach. °· Check residuals by attaching a syringe to the G-tube and pulling back on the syringe plunger. Note the amount, and return the residual back into the stomach. ° °Flushing the G-tube °· The G-tube should be  periodically flushed with clean warm water to keep it from clogging. °? Flush the G-tube after feedings or medications. Draw up 30 mL of warm water in a syringe. Connect the syringe to the G-tube and slowly push the water into the tube. °? Do not push feedings, medications, or flushes rapidly. Flush the G-tube gently and slowly. °? Only use syringes made for G-tubes to flush medications or feedings. °? Your health care provider may want the G-tube flushed more often or with more water. If this is the case, follow your health care provider's instructions. °Feedings °Your health care provider will determine whether feedings are given as a bolus (a certain amount given at one time and at scheduled times) or whether feedings will be given continuously on a feeding pump. °· Formulas should be given at room temperature. °· If feedings are continuous, no more than 4 hours worth of feedings should be placed in the feeding bag. This helps prevent spoilage or accidental excess infusion. °· Cover and place unused formula in the refrigerator. °· If feedings are continuous, stop the feedings when medications or flushes are given. Be sure to restart the feedings. °· Feeding bags and syringes should be replaced as instructed by your health care provider. ° °Giving medication °· In general, it is best if all medications are in a liquid form for G-tube administration. Liquid medications are less likely to clog the G-tube. °? Mix the liquid medication with 30   mL (or amount recommended by your health care provider) of warm water. °? Draw up the medication into the syringe. °? Attach the syringe to the G-tube and slowly push the mixture into the G-tube. °? After giving the medication, draw up 30 mL of warm water in the syringe and slowly flush the G-tube. °· For pills or capsules, check with your health care provider first before crushing medications. Some pills are not effective if they are crushed. Some capsules are sustained-release  medications. °? If appropriate, crush the pill or capsule and mix with 30 mL of warm water. Using the syringe, slowly push the medication through the tube, then flush the tube with another 30 mL of tap water. °G-tube problems °G-tube was pulled out. °· Cause: May have been pulled out accidentally. °· Solutions: Cover the opening with clean dressing and tape. Call your health care provider right away. The G-tube should be put in as soon as possible (within 4 hours) so the G-tube opening (tract) does not close. The G-tube needs to be put in at a health care setting. An X-ray needs to be done to confirm placement before the G-tube can be used again. ° °Redness, irritation, soreness, or foul odor around the gastrostomy site. °· Cause: May be caused by leakage or infection. °· Solutions: Call your health care provider right away. ° °Large amount of leakage of fluid or mucus-like liquid present (a large amount means it soaks clothing). °· Cause: Many reasons could cause the G-tube to leak. °· Solutions: Call your health care provider to discuss the amount of leakage. ° °Skin or scar tissue appears to be growing where tube enters skin. °· Cause: Tissue growth may develop around the insertion site if the G-tube is moved or pulled on excessively. °· Solutions: Secure tube with tape so that excess movement does not occur. Call your health care provider. ° °G-tube is clogged. °· Cause: Thick formula or medication. °· Solutions: Try to slowly push warm water into the tube with a large syringe. Never try to push any object into the tube to unclog it. Do not force fluid into the G-tube. If you are unable to unclog the tube, call your health care provider right away. ° °Tips °· Head of bed (HOB) position refers to the upright position of a person's upper body. °? When giving medications or a feeding bolus, keep the HOB up as told by your health care provider. Do this during the feeding and for 1 hour after the feeding or  medication administration. °? If continuous feedings are being given, it is best to keep the HOB up as told by your health care provider. When ADLs (activities of daily living) are performed and the HOB needs to be flat, be sure to turn the feeding pump off. Restart the feeding pump when the HOB is returned to the recommended height. °· Do not pull or put tension on the tube. °· To prevent fluid backflow, kink the G-tube before removing the cap or disconnecting a syringe. °· Check the G-tube length every day. Measure from the insertion site to the end of the G-tube. If the length is longer than previous measurements, the tube may be coming out. Call your health care provider if you notice increasing G-tube length. °· Oral care, such as brushing teeth, must be continued. °· You may need to remove excess air (vent) from the G-tube. Your health care provider will tell you if this is needed. °· Always call your health care   provider if you have questions or problems with the G-tube. °Get help right away if: °· You have severe abdominal pain, tenderness, or abdominal bloating (distension). °· You have nausea or vomiting. °· You are constipated or have problems moving your bowels. °· The G-tube insertion site is red, swollen, has a foul smell, or has yellow or brown drainage. °· You have difficulty breathing or shortness of breath. °· You have a fever. °· You have a large amount of feeding tube residuals. °· The G-tube is clogged and cannot be flushed. °This information is not intended to replace advice given to you by your health care provider. Make sure you discuss any questions you have with your health care provider. °Document Released: 04/21/2001 Document Revised: 07/19/2015 Document Reviewed: 10/18/2012 °Elsevier Interactive Patient Education © 2017 [employer] ° °Implanted Port Insertion, Care After °This sheet gives you information about how to care for yourself after your procedure. Your health care provider may  also give you more specific instructions. If you have problems or questions, contact your health care provider. °What can I expect after the procedure? °After your procedure, it is common to have: °· Discomfort at the port insertion site. °· Bruising on the skin over the port. This should improve over 3-4 days. ° °Follow these instructions at home: °Port care °· After your port is placed, you will get a manufacturer's information card. The card has information about your port. Keep this card with you at all times. °· Take care of the port as told by your health care provider. Ask your health care provider if you or a family member can get training for taking care of the port at home. A home health care nurse may also take care of the port. °· Make sure to remember what type of port you have. °Incision care °· Follow instructions from your health care provider about how to take care of your port insertion site. Make sure you: °? Wash your hands with soap and water before you change your bandage (dressing). If soap and water are not available, use hand sanitizer. °? Change your dressing as told by your health care provider. °? Leave stitches (sutures), skin glue, or adhesive strips in place. These skin closures may need to stay in place for 2 weeks or longer. If adhesive strip edges start to loosen and curl up, you may trim the loose edges. Do not remove adhesive strips completely unless your health care provider tells you to do that. °· Check your port insertion site every day for signs of infection. Check for: °? More redness, swelling, or pain. °? More fluid or blood. °? Warmth. °? Pus or a bad smell. °General instructions °· Do not take baths, swim, or use a hot tub until your health care provider approves. °· Do not lift anything that is heavier than 10 lb (4.5 kg) for a week, or as told by your health care provider. °· Ask your health care provider when it is okay to: °? Return to work or school. °? Resume usual  physical activities or sports. °· Do not drive for 24 hours if you were given a medicine to help you relax (sedative). °· Take over-the-counter and prescription medicines only as told by your health care provider. °· Wear a medical alert bracelet in case of an emergency. This will tell any health care providers that you have a port. °· Keep all follow-up visits as told by your health care provider. This is important. °Contact a   health care provider if: °· You cannot flush your port with saline as directed, or you cannot draw blood from the port. °· You have a fever or chills. °· You have more redness, swelling, or pain around your port insertion site. °· You have more fluid or blood coming from your port insertion site. °· Your port insertion site feels warm to the touch. °· You have pus or a bad smell coming from the port insertion site. °Get help right away if: °· You have chest pain or shortness of breath. °· You have bleeding from your port that you cannot control. °Summary °· Take care of the port as told by your health care provider. °· Change your dressing as told by your health care provider. °· Keep all follow-up visits as told by your health care provider. °This information is not intended to replace advice given to you by your health care provider. Make sure you discuss any questions you have with your health care provider. °Document Released: 12/01/2012 Document Revised: 01/02/2016 Document Reviewed: 01/02/2016 °Elsevier Interactive Patient Education © 2017 [employer] °Moderate Conscious Sedation, Adult, Care After °These instructions provide you with information about caring for yourself after your procedure. Your health care provider may also give you more specific instructions. Your treatment has been planned according to current medical practices, but problems sometimes occur. Call your health care provider if you have any problems or questions after your procedure. °What can I expect after the  procedure? °After your procedure, it is common: °· To feel sleepy for several hours. °· To feel clumsy and have poor balance for several hours. °· To have poor judgment for several hours. °· To vomit if you eat too soon. ° °Follow these instructions at home: °For at least 24 hours after the procedure: ° °· Do not: °? Participate in activities where you could fall or become injured. °? Drive. °? Use heavy machinery. °? Drink alcohol. °? Take sleeping pills or medicines that cause drowsiness. °? Make important decisions or sign legal documents. °? Take care of children on your own. °· Rest. °Eating and drinking °· Follow the diet recommended by your health care provider. °· If you vomit: °? Drink water, juice, or soup when you can drink without vomiting. °? Make sure you have little or no nausea before eating solid foods. °General instructions °· Have a responsible adult stay with you until you are awake and alert. °· Take over-the-counter and prescription medicines only as told by your health care provider. °· If you smoke, do not smoke without supervision. °· Keep all follow-up visits as told by your health care provider. This is important. °Contact a health care provider if: °· You keep feeling nauseous or you keep vomiting. °· You feel light-headed. °· You develop a rash. °· You have a fever. °Get help right away if: °· You have trouble breathing. °This information is not intended to replace advice given to you by your health care provider. Make sure you discuss any questions you have with your health care provider. °Document Released: 12/01/2012 Document Revised: 07/16/2015 Document Reviewed: 06/02/2015 °Elsevier Interactive Patient Education © 2018 [employer] ° °

## 2016-12-25 NOTE — Consult Note (Signed)
Chief Complaint: Patient was seen in consultation today for Port-A-Cath and percutaneous gastrostomy tube placements  Referring Physician(s): Terry Pacheco  Supervising Physician: Terry Pacheco  Patient Status: [name]  History of Present Illness: Terry Pacheco is a 66 y.o. male , prior smoker, with history of squamous cell carcinoma right tonsil who presents today for Port-A-Cath and gastrostomy tube placements prior to treatment.  Past Medical History:  Diagnosis Date  . Arthritis   . GERD (gastroesophageal reflux disease) 07/06/2014  . Hypertension   . Sleep apnea    does not use CPAP  . Wears glasses     Past Surgical History:  Procedure Laterality Date  . HERNIA REPAIR    . polyp removal  2008   during colonoscopy  . TOTAL HIP ARTHROPLASTY Right 10/16/2015   Procedure: RIGHT TOTAL HIP ARTHROPLASTY ANTERIOR APPROACH;  Surgeon: Paralee Cancel, MD;  Location: [facility];  Service: Orthopedics;  Laterality: Right;  . vocal cord polyp      removed 2-3 years ago     Allergies: Crestor [rosuvastatin]  Medications: Prior to Admission medications   Medication Sig Start Date End Date Taking? Authorizing Provider  amLODipine (NORVASC) 10 MG tablet TAKE 1 TABLET EVERY DAY FOR FOR BLOOD PRESSURE 10/17/16   Claretta Fraise, MD  atorvastatin (LIPITOR) 40 MG tablet TAKE 1 TABLET BY MOUTH EVERY DAY 10/17/16   Claretta Fraise, MD  dexamethasone (DECADRON) 4 MG tablet Take 2 tablets by mouth once a day on the day after chemotherapy and then take 2 tablets two times a day for 2 days. Take with food. 12/22/16   Terry Genera, MD  escitalopram (LEXAPRO) 20 MG tablet TAKE 0.5 TABLETS (10 MG TOTAL) BY MOUTH DAILY. 03/21/16   Claretta Fraise, MD  Multiple Vitamin (MULTIVITAMIN) tablet Take 1 tablet by mouth daily.    [provider]  omeprazole (PRILOSEC) 20 MG capsule TAKE 1 CAPSULE BY MOUTH EVERY DAY 12/08/16   Claretta Fraise, MD  ondansetron (ZOFRAN) 8 MG tablet Take 1  tablet (8 mg total) by mouth 2 (two) times daily as needed. Start on the third day after chemotherapy. 12/22/16   Terry Genera, MD  prochlorperazine (COMPAZINE) 10 MG tablet Take 1 tablet (10 mg total) by mouth every 6 (six) hours as needed (Nausea or vomiting). 12/22/16   Terry Genera, MD  sodium fluoride (FLUORISHIELD) 1.1 % GEL dental gel Instill one drop of gel into each tooth space of fluoride tray. Place over teeth for 5 minutes. Remove. Spit out excess. Repeat nightly Patient not taking: Reported on 12/19/2016 12/18/16   Lenn Cal, DDS  traZODone (DESYREL) 50 MG tablet Take 1 tablet (50 mg total) by mouth at bedtime as needed for sleep. Patient not taking: Reported on 12/16/2016 12/16/16   Terry Genera, MD  TURMERIC PO Take 1,200 mg by mouth.    [provider]     Family History  Problem Relation Age of Onset  . Heart disease Mother   . Stroke Father     Social History   Social History  . Marital status: Married    Spouse name: N/A  . Number of children: 1  . Years of education: N/A   Occupational History  . Retired  Pensions consultant [employer]   Social History Main Topics  . Smoking status: Former Smoker    Packs/day: 0.50    Years: 45.00    Types: Cigarettes    Start date: 01/12/1983    Quit  date: 11/17/2016  . Smokeless tobacco: Never Used  . Alcohol use 0.0 oz/week     Comment: rare  . Drug use: Yes    Types: Marijuana     Comment: last use 10/09/2015  . Sexual activity: Not on file   Other Topics Concern  . Not on file   Social History Narrative  . No narrative on file      Review of Systems currently denies fever, headache, chest pain, dyspnea, cough, abdominal/back pain, nausea, vomiting bleeding. He does have mild throat soreness.  Vital Signs: Blood pressure 126/83, heart rate 60, O2 sat 95% room air, temperature 97.7    Physical Exam awake, alert. Chest with distant breath sounds bilaterally. Heart with regular  rate and rhythm, murmur noted. Abdomen obese, soft, positive bowel sounds, nontender.No LE edema.  Imaging: Nm Pet Image Initial (pi) Skull Base To Thigh  Result Date: 12/02/2016 CLINICAL DATA:  Initial treatment strategy for tonsillar cancer. EXAM: NUCLEAR MEDICINE PET SKULL BASE TO THIGH TECHNIQUE: 10.8 mCi F-18 FDG was injected intravenously. Full-ring PET imaging was performed from the skull base to thigh after the radiotracer. CT data was obtained and used for attenuation correction and anatomic localization. FASTING BLOOD GLUCOSE:  Value: 105 mg/dl COMPARISON:  CT neck 11/17/2016 FINDINGS: NECK Mildly asymmetric tonsillar activity along the right tonsillar sinus, maximum SUV 7.7 as compared to the left side which measures 5.2. The cystic lesion along the right lateral pharyngeal wall is not appreciably hypermetabolic. A right level IIa lymph node measures 1.2 cm in short axis on image 33/4 and has maximum SUV of 8.4, compatible with malignancy. Bilateral carotid atherosclerotic calcification. CHEST No hypermetabolic mediastinal or hilar nodes. No suspicious pulmonary nodules on the CT data. Paraseptal emphysema. Coronary, aortic arch, and branch vessel atherosclerotic vascular disease. Small type 1 hiatal hernia. ABDOMEN/PELVIS No abnormal hypermetabolic activity within the liver, pancreas, adrenal glands, or spleen. No hypermetabolic lymph nodes in the abdomen or pelvis. Faint accentuation of metabolic activity in the left lateral prostate gland apex, maximum SUV 4.5. Aortoiliac atherosclerotic vascular disease. Photopenic right kidney upper pole cyst. Left mid kidney cyst anteriorly. Diffuse colonic diverticulosis most severe in the sigmoid colon with associated wall thickening. Physiologic activity is observed in the bowel, especially the colon. No hypermetabolic adenopathy observed. SKELETON No focal hypermetabolic activity to suggest skeletal metastasis. Right hip prosthesis. Transitional lumbosacral  vertebra. Unusual sternal angulation, query old fracture or pectus carinatum. IMPRESSION: 1. Mildly asymmetric tonsillar activity along the right tonsillar sinus, maximum SUV 7.7 as compared to the left side 5.2, suspicious in this setting for right-sided malignancy. There is also a mildly enlarged and hypermetabolic right level IIa lymph node with maximum SUV of 8.4. No evidence of other metastatic spread. 2. Faint accentuation of activity in the left lateral prostate gland apex, but low-grade. Correlate with PSA level in determining whether further workup is warranted. 3. Other imaging findings of potential clinical significance: Aortic Atherosclerosis (ICD10-I70.0) and Emphysema (ICD10-J43.9). Coronary atherosclerosis. Small type 1 hiatal hernia. Bilateral renal cysts. Colonic diverticulosis. Electronically Signed   By: Van Clines M.D.   On: 12/02/2016 15:53   Mr Face/trigeminal Wo/w Cm  Result Date: 12/09/2016 CLINICAL DATA:  Tonsillar cancer.  Evaluation for perineural spread. Creatinine was obtained on site at [facility] at [address] Results: Creatinine 1.3 mg/dL. EXAM: MRI FACE TRIGEMINAL WITHOUT AND WITH CONTRAST TECHNIQUE: Multiplanar, multiecho pulse sequences of the face and surrounding structures, including thin slice imaging of the course of the Trigeminal  Nerves, were obtained both before and after administration of intravenous contrast. CONTRAST:  21mL MULTIHANCE GADOBENATE DIMEGLUMINE 529 MG/ML IV SOLN COMPARISON:  None. FINDINGS: Visualized brain: Normal parenchymal signal. No extra-axial collection. Orbits: Normal optic nerves and extra-axial muscles. The globes are intact. Skull base: Meckel's cave is normal bilaterally. No abnormal enhancement in foramina rotundum or ovale. No abnormality of the internal auditory canals or jugular foramina. No abnormality at the hypoglossal canal. Paranasal sinuses and mastoids: Clear. Aerodigestive tract: There is an unchanged  cystic spaces along the right wall of the pharynx. There is mild asymmetry of the palatine tonsils but no clear mass. IMPRESSION: 1. No evidence of perineural spread of malignancy. 2. Mild asymmetry of the palatine tonsils without discrete mass. Correlate with direct visualization. Electronically Signed   By: Ulyses Jarred M.D.   On: 12/09/2016 18:54    Labs:  CBC:  Recent Labs  04/16/16 1139 12/16/16 1220 12/25/16 1144  WBC 7.7 7.7 7.3  HGB 15.2 16.0 15.3  HCT 44.9 46.5 43.8  PLT 229 193 199    COAGS:  Recent Labs  12/16/16 1220  INR 1.00*    BMP:  Recent Labs  04/16/16 1139 12/16/16 1220  NA 138 139  K 4.6 4.4  CL 99  --   CO2 23 24  GLUCOSE 93 96  BUN 15 20.3  CALCIUM 9.4 9.7  CREATININE 1.01 1.2  GFRNONAA 78  --   GFRAA 90  --     LIVER FUNCTION TESTS:  Recent Labs  04/16/16 1139 12/16/16 1220  BILITOT 0.6 0.80  AST 21 22  ALT 23 30  ALKPHOS 154* 156*  PROT 7.0 7.5  ALBUMIN 4.3 4.2    TUMOR MARKERS: No results for input(s): AFPTM, CEA, CA199, CHROMGRNA in the last 8760 hours.  Assessment and Plan: 66 y.o. male , prior smoker, with history of squamous cell carcinoma right tonsil who presents today for Port-A-Cath and gastrostomy tube placements prior to treatment.Risks and benefits discussed with the patient/family including, but not limited to the need for a barium enema during the procedure, bleeding, infection, peritonitis, or damage to adjacent structures, pneumothorax, vascular injury. All of the patient's questions were answered, patient is agreeable to proceed. Consent signed and in chart.     Thank you for this interesting consult.  I greatly enjoyed meeting Terry Pacheco and look forward to participating in their care.  A copy of this report was sent to the requesting provider on this date.  Electronically Signed: D. Rowe Robert, PA-C 12/25/2016, 12:12 PM   I spent a total of  25 minutes   in face to face in clinical consultation,  greater than 50% of which was counseling/coordinating care for Port-A-Cath and percutaneous gastrostomy tube placements

## 2016-12-25 NOTE — Discharge Instructions (Addendum)
DIET:  Clear liquids tonight and full diet tomorrow.Clear liquids tonight and full diet tomorrow.   Care of a Feeding Tube Feeding tubes are often given to those who have trouble swallowing or cannot take food or medicine. A feeding tube can:  Go into the nose and down to the stomach.  Go through the skin in the belly (abdomen) and into the stomach or small bowel.  Supplies needed to care for the tube site:  Clean gloves.  Clean wash cloth, gauze pads, or soft paper towel.  Cotton swabs.  Skin barrier ointment or cream.  Soap and water.  Precut foam pads or gauze (that go around the tube).  Tube tape. Tube site care 1. Have all supplies ready. 2. Wash hands well. 3. Put on clean gloves. 4. Remove dirty foam pads or gauze near the tube site, if present. 5. Check the skin around the tube site for redness, rash, puffiness (swelling), leaking fluid, or extra tissue growth. Call your doctor if you see any of these. 6. Wet the gauze and cotton swabs with water and soap. 7. Wipe the area closest to the tube with cotton swabs. Wipe the surrounding skin with moistened gauze. Rinse with water. 8. Dry the skin and tube site with a dry gauze pad or soft paper towel. Do not use antibiotic ointments at the tube site. 9. If the skin is red, apply petroleum jelly in a circular motion, using a cotton swab. Your doctor may suggest a different cream or ointment. Use what the doctor suggests. 10. Apply a new pre-cut foam pad or gauze around the tube. Tape the edges down. Foam pads or gauze may be left off if there is no fluid at the tube site. 11. Use tape or a device that will attach your feeding tube to your skin or do as directed. Rotate where you tape the tube. 12. Sit the person up. 13. Throw away used supplies. 14. Remove gloves. 15. Wash hands. Supplies needed to flush a feeding tube:  Clean gloves.  60 mL syringe (that connects to feeding tube).  Towel.  Water. Flushing a  feeding tube 1. Have all supplies ready. 2. Wash hands well. 3. Put on clean gloves. 4. Pull 30 mL of water into the syringe. 5. Bend (kink) the feeding tube while disconnecting it from the feeding-bag tubing or while removing the plug at the end of the tube. 6. Insert the tip of the syringe into the end of the feeding tube. Stop bending the tube. Slowly inject the water. 7. If you cannot inject the water, the person with the feeding tube should lay on their left side. ? Do not use a syringe smaller than 60 mL to flush the tubing. ? Do not inject the water with force. 8. After injecting the water, remove the syringe. 9. Always flush the tube before giving the first medicine, between medicines, and after the final medicine before starting a feeding. ? Do not mix medicines with liquid food (formula) before giving medicines. ? Do not mix medicines with other medicines before giving medicines. ? Completely flush medicines through the tube so they do not mix with the liquid food. 10. Throw away used supplies. 11. Remove gloves. 12. Wash hands. This information is not intended to replace advice given to you by your health care provider. Make sure you discuss any questions you have with your health care provider. Document Released: 11/05/2011 Document Revised: 07/19/2015 Document Reviewed: 09/25/2011 Elsevier Interactive Patient Education  2017 [employer]  [place] An implanted port is a type of central line that is placed under the skin. Central lines are used to provide IV access when treatment or nutrition needs to be given through a persons veins. Implanted ports are used for long-term IV access. An implanted port may be placed because:  You need IV medicine that would be irritating to the small veins in your hands or arms.  You need long-term IV medicines, such as antibiotics.  You need IV nutrition for a long period.  You need frequent blood draws for lab  tests.  You need dialysis.  Implanted ports are usually placed in the chest area, but they can also be placed in the upper arm, the abdomen, or the leg. An implanted port has two main parts:  Reservoir. The reservoir is round and will appear as a small, raised area under your skin. The reservoir is the part where a needle is inserted to give medicines or draw blood.  Catheter. The catheter is a thin, flexible tube that extends from the reservoir. The catheter is placed into a large vein. Medicine that is inserted into the reservoir goes into the catheter and then into the vein.  How will I care for my incision site? Do not get the incision site wet. Bathe or shower as directed by your health care provider. How is my port accessed? Special steps must be taken to access the port:  Before the port is accessed, a numbing cream can be placed on the skin. This helps numb the skin over the port site.  Your health care provider uses a sterile technique to access the port. ? Your health care provider must put on a mask and sterile gloves. ? The skin over your port is cleaned carefully with an antiseptic and allowed to dry. ? The port is gently pinched between sterile gloves, and a needle is inserted into the port.  Only "non-coring" port needles should be used to access the port. Once the port is accessed, a blood return should be checked. This helps ensure that the port is in the vein and is not clogged.  If your port needs to remain accessed for a constant infusion, a clear (transparent) bandage will be placed over the needle site. The bandage and needle will need to be changed every week, or as directed by your health care provider.  Keep the bandage covering the needle clean and dry. Do not get it wet. Follow your health care providers instructions on how to take a shower or bath while the port is accessed.  If your port does not need to stay accessed, no bandage is needed over the  port.  What is flushing? Flushing helps keep the port from getting clogged. Follow your health care providers instructions on how and when to flush the port. Ports are usually flushed with saline solution or a medicine called heparin. The need for flushing will depend on how the port is used.  If the port is used for intermittent medicines or blood draws, the port will need to be flushed: ? After medicines have been given. ? After blood has been drawn. ? As part of routine maintenance.  If a constant infusion is running, the port may not need to be flushed.  How long will my port stay implanted? The port can stay in for as long as your health care provider thinks it is needed. When it is time for the port to come  out, surgery will be done to remove it. The procedure is similar to the one performed when the port was put in. When should I seek immediate medical care? When you have an implanted port, you should seek immediate medical care if:  You notice a bad smell coming from the incision site.  You have swelling, redness, or drainage at the incision site.  You have more swelling or pain at the port site or the surrounding area.  You have a fever that is not controlled with medicine.  This information is not intended to replace advice given to you by your health care provider. Make sure you discuss any questions you have with your health care provider. Document Released: 02/10/2005 Document Revised: 07/19/2015 Document Reviewed: 10/18/2012 Elsevier Interactive Patient Education  2017 [place] Insertion, Care After This sheet gives you information about how to care for yourself after your procedure. Your health care provider may also give you more specific instructions. If you have problems or questions, contact your health care provider. What can I expect after the procedure? After your procedure, it is common to have:  Discomfort at the port insertion  site.  Bruising on the skin over the port. This should improve over 3-4 days.  Follow these instructions at home: [facility] care  After your port is placed, you will get a manufacturer's information card. The card has information about your port. Keep this card with you at all times.  Take care of the port as told by your health care provider. Ask your health care provider if you or a family member can get training for taking care of the port at home. A home health care nurse may also take care of the port.  Make sure to remember what type of port you have. Incision care  Follow instructions from your health care provider about how to take care of your port insertion site. Make sure you: ? Wash your hands with soap and water before you change your bandage (dressing). If soap and water are not available, use hand sanitizer. ? Change your dressing as told by your health care provider.  You may remove port dressing tomorrow 12/26/16. ? Leave skin glue, or adhesive strips in place. These skin closures may need to stay in place for 2 weeks or longer. Do not remove adhesive strips completely unless your health care provider tells you to do that.  DO NOT use EMLA cream for 2 weeks after port placement as this cream will remove surgical glue.  When accessed, ice can be placed on port site for numbing.  Check your port insertion site every day for signs of infection. Check for: ? More redness, swelling, or pain. ? More fluid or blood. ? Warmth. ? Pus or a bad smell. General instructions  Do not take baths, swim, or use a hot tub until your health care provider approves.  You may shower tomorrow 12/26/16.  Do not lift anything that is heavier than 10 lb (4.5 kg) for a week, or as told by your health care provider.  Ask your health care provider when it is okay to: ? Return to work or school. ? Resume usual physical activities or sports.  Do not drive for 24 hours if you were given a medicine to help  you relax (sedative).  Take over-the-counter and prescription medicines only as told by your health care provider.  Wear a medical alert bracelet in case of an emergency. This will tell  any health care providers that you have a port.  Keep all follow-up visits as told by your health care provider. This is important. Contact a health care provider if:  You have a fever or chills.  You have more redness, swelling, or pain around your port insertion site.  You have more fluid or blood coming from your port insertion site.  Your port insertion site feels warm to the touch.  You have pus or a bad smell coming from the port insertion site. Get help right away if:  You have chest pain or shortness of breath.  You have bleeding from your port that you cannot control. Summary  Take care of the port as told by your health care provider.  Change your dressing as told by your health care provider.  Keep all follow-up visits as told by your health care provider. This information is not intended to replace advice given to you by your health care provider. Make sure you discuss any questions you have with your health care provider. Document Released: 12/01/2012 Document Revised: 01/02/2016 Document Reviewed: 01/02/2016 Elsevier Interactive Patient Education  2017 [place].   Moderate Conscious Sedation, Adult, Care After These instructions provide you with information about caring for yourself after your procedure. Your health care provider may also give you more specific instructions. Your treatment has been planned according to current medical practices, but problems sometimes occur. Call your health care provider if you have any problems or questions after your procedure. What can I expect after the procedure? After your procedure, it is common:  To feel sleepy for several hours.  To feel clumsy and have poor balance for several hours.  To have poor judgment for several  hours.  To vomit if you eat too soon.  Follow these instructions at home: For at least 24 hours after the procedure:   Do not: ? Participate in activities where you could fall or become injured. ? Drive. ? Use heavy machinery. ? Drink alcohol. ? Take sleeping pills or medicines that cause drowsiness. ? Make important decisions or sign legal documents. ? Take care of children on your own.  Rest. Eating and drinking  Follow the diet recommended by your health care provider.  If you vomit: ? Drink water, juice, or soup when you can drink without vomiting. ? Make sure you have little or no nausea before eating solid foods. General instructions  Have a responsible adult stay with you until you are awake and alert.  Take over-the-counter and prescription medicines only as told by your health care provider.  If you smoke, do not smoke without supervision.  Keep all follow-up visits as told by your health care provider. This is important. Contact a health care provider if:  You keep feeling nauseous or you keep vomiting.  You feel light-headed.  You develop a rash.  You have a fever. Get help right away if:  You have trouble breathing. This information is not intended to replace advice given to you by your health care provider. Make sure you discuss any questions you have with your health care provider. Document Released: 12/01/2012 Document Revised: 07/16/2015 Document Reviewed: 06/02/2015 Elsevier Interactive Patient Education  [employer].

## 2016-12-29 ENCOUNTER — Other Ambulatory Visit: Payer: 59

## 2016-12-29 ENCOUNTER — Telehealth: Payer: Self-pay | Admitting: *Deleted

## 2016-12-29 ENCOUNTER — Telehealth: Payer: Self-pay

## 2016-12-29 NOTE — Telephone Encounter (Signed)
Spoke with patient with upcoming appointment changes, per 11/2 sch message

## 2016-12-29 NOTE — Telephone Encounter (Signed)
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  Per Dr. Lebron Conners, informed Tylenol, no ibuprofen.  She voided understanding.  Gayleen Orem, RN, BSN, [place] [facility] Nurse [place] at [place] [phone]

## 2016-12-29 NOTE — Telephone Encounter (Signed)
Oncology Nurse Navigator Documentation  Spoke with patient wife, informed tomorrow's RT appt moved to 0800 to accommodate tomorrow's infusion appt.  She voiced understanding.  Gayleen Orem, RN, BSN, [place] [facility] Nurse [place] at [place] [phone]

## 2016-12-30 ENCOUNTER — Ambulatory Visit: Payer: 59

## 2016-12-30 ENCOUNTER — Ambulatory Visit (HOSPITAL_BASED_OUTPATIENT_CLINIC_OR_DEPARTMENT_OTHER): Payer: 59

## 2016-12-30 ENCOUNTER — Ambulatory Visit: Payer: 59 | Admitting: Hematology

## 2016-12-30 ENCOUNTER — Other Ambulatory Visit: Payer: 59

## 2016-12-30 ENCOUNTER — Ambulatory Visit: Admission: RE | Admit: 2016-12-30 | Payer: 59 | Source: Ambulatory Visit | Admitting: Radiation Oncology

## 2016-12-30 ENCOUNTER — Encounter: Payer: Self-pay | Admitting: Hematology

## 2016-12-30 ENCOUNTER — Encounter: Payer: Self-pay | Admitting: *Deleted

## 2016-12-30 ENCOUNTER — Other Ambulatory Visit (HOSPITAL_BASED_OUTPATIENT_CLINIC_OR_DEPARTMENT_OTHER): Payer: 59

## 2016-12-30 ENCOUNTER — Ambulatory Visit (HOSPITAL_BASED_OUTPATIENT_CLINIC_OR_DEPARTMENT_OTHER): Payer: 59 | Admitting: Hematology

## 2016-12-30 VITALS — BP 131/77 | HR 75 | Temp 97.6°F | Resp 17

## 2016-12-30 VITALS — BP 144/87 | HR 75 | Temp 98.7°F | Resp 16 | Ht 69.0 in | Wt 214.3 lb

## 2016-12-30 DIAGNOSIS — Z7189 Other specified counseling: Secondary | ICD-10-CM

## 2016-12-30 DIAGNOSIS — C090 Malignant neoplasm of tonsillar fossa: Secondary | ICD-10-CM

## 2016-12-30 DIAGNOSIS — Z5111 Encounter for antineoplastic chemotherapy: Secondary | ICD-10-CM | POA: Diagnosis not present

## 2016-12-30 DIAGNOSIS — C099 Malignant neoplasm of tonsil, unspecified: Secondary | ICD-10-CM

## 2016-12-30 DIAGNOSIS — Z510 Encounter for antineoplastic radiation therapy: Secondary | ICD-10-CM | POA: Diagnosis not present

## 2016-12-30 DIAGNOSIS — Z1329 Encounter for screening for other suspected endocrine disorder: Secondary | ICD-10-CM

## 2016-12-30 LAB — CBC WITH DIFFERENTIAL/PLATELET
BASO%: 0.4 % (ref 0.0–2.0)
Basophils Absolute: 0 10*3/uL (ref 0.0–0.1)
EOS%: 5 % (ref 0.0–7.0)
Eosinophils Absolute: 0.4 10*3/uL (ref 0.0–0.5)
HCT: 42.1 % (ref 38.4–49.9)
HEMOGLOBIN: 14.5 g/dL (ref 13.0–17.1)
LYMPH#: 1.8 10*3/uL (ref 0.9–3.3)
LYMPH%: 20.9 % (ref 14.0–49.0)
MCH: 30.9 pg (ref 27.2–33.4)
MCHC: 34.4 g/dL (ref 32.0–36.0)
MCV: 89.6 fL (ref 79.3–98.0)
MONO#: 0.6 10*3/uL (ref 0.1–0.9)
MONO%: 7.3 % (ref 0.0–14.0)
NEUT#: 5.6 10*3/uL (ref 1.5–6.5)
NEUT%: 66.4 % (ref 39.0–75.0)
Platelets: 170 10*3/uL (ref 140–400)
RBC: 4.7 10*6/uL (ref 4.20–5.82)
RDW: 12.6 % (ref 11.0–14.6)
WBC: 8.5 10*3/uL (ref 4.0–10.3)

## 2016-12-30 LAB — TSH: TSH: 2.099 m(IU)/L (ref 0.320–4.118)

## 2016-12-30 LAB — COMPREHENSIVE METABOLIC PANEL
ALT: 17 U/L (ref 0–55)
ANION GAP: 9 meq/L (ref 3–11)
AST: 18 U/L (ref 5–34)
Albumin: 3.8 g/dL (ref 3.5–5.0)
Alkaline Phosphatase: 136 U/L (ref 40–150)
BILIRUBIN TOTAL: 0.73 mg/dL (ref 0.20–1.20)
BUN: 13.2 mg/dL (ref 7.0–26.0)
CALCIUM: 9.3 mg/dL (ref 8.4–10.4)
CHLORIDE: 102 meq/L (ref 98–109)
CO2: 24 mEq/L (ref 22–29)
CREATININE: 1.1 mg/dL (ref 0.7–1.3)
Glucose: 122 mg/dl (ref 70–140)
Potassium: 4.2 mEq/L (ref 3.5–5.1)
Sodium: 135 mEq/L — ABNORMAL LOW (ref 136–145)
TOTAL PROTEIN: 7.3 g/dL (ref 6.4–8.3)

## 2016-12-30 MED ORDER — PALONOSETRON HCL INJECTION 0.25 MG/5ML
0.2500 mg | Freq: Once | INTRAVENOUS | Status: AC
  Administered 2016-12-30: 0.25 mg via INTRAVENOUS

## 2016-12-30 MED ORDER — HEPARIN SOD (PORK) LOCK FLUSH 100 UNIT/ML IV SOLN
500.0000 [IU] | Freq: Once | INTRAVENOUS | Status: AC | PRN
  Administered 2016-12-30: 500 [IU]
  Filled 2016-12-30: qty 5

## 2016-12-30 MED ORDER — SODIUM CHLORIDE 0.9% FLUSH
10.0000 mL | INTRAVENOUS | Status: DC | PRN
  Administered 2016-12-30: 10 mL
  Filled 2016-12-30: qty 10

## 2016-12-30 MED ORDER — POTASSIUM CHLORIDE 2 MEQ/ML IV SOLN
Freq: Once | INTRAVENOUS | Status: AC
  Administered 2016-12-30: 11:00:00 via INTRAVENOUS
  Filled 2016-12-30: qty 10

## 2016-12-30 MED ORDER — SODIUM CHLORIDE 0.9 % IV SOLN
40.0000 mg/m2 | Freq: Once | INTRAVENOUS | Status: AC
  Administered 2016-12-30: 86 mg via INTRAVENOUS
  Filled 2016-12-30: qty 86

## 2016-12-30 MED ORDER — SODIUM CHLORIDE 0.9 % IV SOLN
Freq: Once | INTRAVENOUS | Status: AC
  Administered 2016-12-30: 13:00:00 via INTRAVENOUS
  Filled 2016-12-30: qty 5

## 2016-12-30 MED ORDER — SODIUM CHLORIDE 0.9 % IV SOLN
Freq: Once | INTRAVENOUS | Status: AC
  Administered 2016-12-30: 10:00:00 via INTRAVENOUS

## 2016-12-30 NOTE — Patient Instructions (Signed)
Use a salt and baking soda mouth wash consisting of 16oz lukewarm water with 1 tsp salt and 1 tsp baking soda, gargle 4-5 times daily to assist with mouth irritation.

## 2016-12-30 NOTE — Progress Notes (Signed)
Oncology Nurse Navigator Documentation  To provide support, encouragement and care continuity, met with Terry Pacheco for his initial [place] RT.  He was accompanied by his wife.  His wife observed the treatment, RTT Faith explained procedure.  Terry Pacheco expressed appreciation for the learning opportunity.  Terry Pacheco completed treatment without difficulty, denied questions/concerns.  He proceeded to lobby to register for labs.  Gayleen Orem, RN, BSN, [place] [facility] Nurse [place] [facility] [place] [phone]

## 2016-12-30 NOTE — Progress Notes (Signed)
HEMATOLOGY/ONCOLOGY FOLLOW UP VISIT NOTE  Date of Service: 12/30/2016  Patient Care Team: Claretta Fraise, MD as PCP - General (Family Medicine) Jodi Marble, MD as Consulting Physician (Otolaryngology) Eppie Gibson, MD as Attending Physician (Radiation Oncology) Leota Sauers, RN as Oncology Nurse Navigator (Oncology)  CHIEF COMPLAINTS/PURPOSE OF CONSULTATION:  Newly diagnosed tonsillar squamous cell carcinoma  HISTORY OF PRESENTING ILLNESS:   Terry Pacheco is a wonderful 66 y.o. male who has been referred to [place] by ENT Specialist, Dr. Erik Obey for evaluation and management of invasive squamous cell carcinoma of tonsil.   He has a PMHx of GERD, HTN, High cholesterol which he takes medications for. He denies PMHx of seizures. He is accompanied by his wife and sister today. He reports that he is doing well overall. The pt initially presented with sudden-onset right lateral tongue numbness that has been ongoing for approximately 1 year. Subsequently, he had left sided gum swelling and intermittent sore throat symptoms. He notes no stroke-like symptoms. He notes that following these symptoms, he was evaluated by multiple providers including an ENT specialist and a dentist prior to meeting with Dr. Erik Obey. Pt wife noted swelling and discoloration inside his mouth prior to the patient being evaluated by Dr. Erik Obey. At his first visit with Dr. Erik Obey, he noted an abnormality to his left tonsil and completed a biopsy that same day.   He has had CT soft tissue neck with contrast on 11/18/2016 with results showing: Fullness of the right palatine tonsil. Surrounding fat planes are ill-defined. This area is obscured by dental artifact. Possible right tonsillar carcinoma.   The patient has had biopsy completed on 11/19/2016 with results of: "RIGHT FAUCIAL TONSIL", BIOPSY: Squamous cell carcinoma, suspicious for superficial invasion in this material. Immunostain p16 positive.   He also had a PET  scan completed on 12/02/2016 with results revealing: IMPRESSION: 1. Mildly asymmetric tonsillar activity along the right tonsillar sinus, maximum SUV 7.7 as compared to the left side 5.2, suspicious in this setting for right-sided malignancy. There is also a mildly enlarged and hypermetabolic right level IIa lymph node with maximum SUV of 8.4. No evidence of other metastatic spread. 2. Faint accentuation of activity in the left lateral prostate gland apex, but low-grade. Correlate with PSA level in determining whether further workup is warranted. 3. Other imaging findings of potential clinical significance: Aortic Atherosclerosis (ICD10-I70.0) and Emphysema (ICD10-J43.9). Coronary atherosclerosis. Small type 1 hiatal hernia. Bilateral renal cysts. Colonic diverticulosis. Lastly, he had a MR Face trigeminal on 12/09/2016 with results of IMPRESSION: 1. No evidence of perineural spread of malignancy. 2. Mild asymmetry of the palatine tonsils without discrete mass. Correlate with direct visualization.   As far as surgeries, he has had a polyp on left sided throat that was removed 3-4 years ago that resulted negative for malignancy. He also has had a right hip replacement. Lastly, he has had a left inguinal hernia operation in 1978. He started smoking cigarettes at age 8 and would smoke 1 PPD while working. When he retired several years ago he smoked 0.5 PPD and has quit smoking cigarettes 1 month ago since his diagnosis. He states that he used a vape after quitting but was advised to quit via Dr. Isidore Moos. He occassionaly consumes ETOH. He notes that he previously worked in a Education administrator at [employer]. Denies allergies to medications at this time. He reports that his father died of a stroke at age 69 and his mother died from cardiac issues.  Mother had gastric cancer and was diagnosed in late 76's. Denies any other cancers of blood disorders in the family.    On review of systems, he denies  hematuria, dysuria, or urinary retention. He reports urinary frequency over the past year. He reports bowel incontinence following a bowel movement x 1 year that has gradually improved more recently. He notes that his bowel incontinence has been intermittent. He has well formed stools without blood in stools or rectal bleeding. He denies increase in bowel movements and he has up to 1 bowel movement daily. He has had 2-3 occurrences of hemorrhoids with no recent flares. He denies mucous in his stools. He denies injuries or surgeries to his rectal area. He denies bladder incontinence. He has lower back pain that has been chronic and unchanged. He has a prior hx of diverticulitis approximately 10 years ago that was followed with colonoscopy and was then diagnosed with diverticulosis. He denies headache at this time. He is able to ambulate for prolonged periods without dyspnea on exertion. 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 He has had an audiogram completed by an ENT specialist in [place], [place]:  Cisplatin weeks with concurrent XRT  INTERIM HISTORY:   Terry Pacheco presents today for scheduled follow up and for his first cisplatin treatment. Since we last saw him he has not had any issues with his port, however, he has experienced some mild cramps surrounding his PEG tube but this is resolving. He denies any leakage from the area. He had his first radiation treatment this morning and he states that he was able to tolerate this well. He has completed his chemotherapy counselling an had some additional questions which were answered in details.  On review of systems, pt denies fever, chills, rash, mouth sores, weight loss, decreased appetite, urinary complaints. Denies pain. Pt denies nausea, vomiting. He reports some mild cramping around his PEG tube insertion.   MEDICAL HISTORY:  Past Medical History:  Diagnosis Date  .  Arthritis   . GERD (gastroesophageal reflux disease) 07/06/2014  . Hypertension   . Sleep apnea    does not use CPAP  . Wears glasses     SURGICAL HISTORY: Past Surgical History:  Procedure Laterality Date  . HERNIA REPAIR    . IR FLUORO GUIDE PORT INSERTION RIGHT  12/25/2016  . IR GASTROSTOMY TUBE MOD SED  12/25/2016  . IR US GUIDE VASC ACCESS RIGHT  12/25/2016  . polyp removal  2008   during colonoscopy  . vocal cord polyp      removed 2-3 years ago     SOCIAL HISTORY: Social History   Socioeconomic History  . Marital status: Married    Spouse name: Not on file  . Number of children: 1  . Years of education: Not on file  . Highest education level: Not on file  Social Needs  . Financial resource strain: Not on file  . Food insecurity - worry: Not on file  . Food insecurity - inability: Not on file  . Transportation needs - medical: Not on file  . Transportation needs - non-medical: Not on file  Occupational History  . Occupation: Retired     Fish farm manager: [employer]  Tobacco Use  . Smoking status: Former Smoker    Packs/day: 0.50    Years: 45.00    Pack years: 22.50    Types: Cigarettes    Start  date: 01/12/1983    Last attempt to quit: 11/17/2016    Years since quitting: 0.1  . Smokeless tobacco: Never Used  Substance and Sexual Activity  . Alcohol use: Yes    Alcohol/week: 0.0 oz    Comment: rare  . Drug use: Yes    Types: Marijuana    Comment: last use 10/09/2015  . Sexual activity: Not on file  Other Topics Concern  . Not on file  Social History Narrative  . Not on file    FAMILY HISTORY: Family History  Problem Relation Age of Onset  . Heart disease Mother   . Stroke Father     ALLERGIES:  is allergic to crestor [rosuvastatin].  MEDICATIONS:  Current Outpatient Medications  Medication Sig Dispense Refill  . amLODipine (NORVASC) 10 MG tablet TAKE 1 TABLET EVERY DAY FOR FOR BLOOD PRESSURE 90 tablet 0  . atorvastatin (LIPITOR) 40 MG tablet  TAKE 1 TABLET BY MOUTH EVERY DAY 90 tablet 0  . escitalopram (LEXAPRO) 20 MG tablet TAKE 0.5 TABLETS (10 MG TOTAL) BY MOUTH DAILY. 45 tablet 0  . lidocaine-prilocaine (EMLA) cream Apply 1 application topically as needed. 30 g 6  . Multiple Vitamin (MULTIVITAMIN) tablet Take 1 tablet by mouth daily.    Marland Kitchen omeprazole (PRILOSEC) 20 MG capsule TAKE 1 CAPSULE BY MOUTH EVERY DAY 90 capsule 0  . sodium fluoride (FLUORISHIELD) 1.1 % GEL dental gel Instill one drop of gel into each tooth space of fluoride tray. Place over teeth for 5 minutes. Remove. Spit out excess. Repeat nightly 120 mL prn  . TURMERIC PO Take 1,200 mg by mouth.    . dexamethasone (DECADRON) 4 MG tablet Take 2 tablets by mouth once a day on the day after chemotherapy and then take 2 tablets two times a day for 2 days. Take with food. (Patient not taking: Reported on 12/30/2016) 30 tablet 1  . ondansetron (ZOFRAN) 8 MG tablet Take 1 tablet (8 mg total) by mouth 2 (two) times daily as needed. Start on the third day after chemotherapy. (Patient not taking: Reported on 12/30/2016) 30 tablet 1  . prochlorperazine (COMPAZINE) 10 MG tablet Take 1 tablet (10 mg total) by mouth every 6 (six) hours as needed (Nausea or vomiting). (Patient not taking: Reported on 12/30/2016) 30 tablet 1  . traZODone (DESYREL) 50 MG tablet Take 1 tablet (50 mg total) by mouth at bedtime as needed for sleep. (Patient not taking: Reported on 12/16/2016) 30 tablet 1   No current facility-administered medications for this visit.     REVIEW OF SYSTEMS:    A 10+ POINT REVIEW OF SYSTEMS WAS OBTAINED including neurology, dermatology, psychiatry, cardiac, respiratory, lymph, extremities, GI, GU, Musculoskeletal, constitutional, breasts, reproductive, HEENT.  All pertinent positives are noted in the HPI.  All others are negative.  PHYSICAL EXAMINATION: ECOG PERFORMANCE STATUS: 1 - Symptomatic but completely ambulatory  . Vitals:   12/30/16 0922  BP: (!) 144/87  Pulse: 75    Resp: 16  Temp: 98.7 F (37.1 C)  SpO2: 95%   Filed Weights   12/30/16 0922  Weight: 214 lb 4.8 oz (97.2 kg)   .Body mass index is 31.65 kg/m.  GENERAL:alert, in no acute distress and comfortable SKIN: no acute rashes, no significant lesions EYES: conjunctiva are pink and non-injected, sclera anicteric OROPHARYNX: Right tonsillar mass that is close to the base of the tongue and extending into the right side of the roof of his mouth. MMM, no exudates, no oropharyngeal erythema or ulceration  NECK: supple, no JVD. Small 1 cm lymph node in right upper neck.  LYMPH:  no palpable lymphadenopathy in the cervical, axillary or inguinal regions LUNGS: clear to auscultation b/l with normal respiratory effort HEART: 2/6 systolic murmur. Regular rate & rhythm ABDOMEN:  normoactive bowel sounds , non tender, not distended. Extremity: no pedal edema PSYCH: alert & oriented x 3 with fluent speech NEURO: no focal motor/sensory deficits    LABORATORY DATA:  I have reviewed the data as listed  . CBC Latest Ref Rng & Units 12/30/2016 12/25/2016 12/16/2016  WBC 4.0 - 10.3 10e3/uL 8.5 7.3 7.7  Hemoglobin 13.0 - 17.1 g/dL 14.5 15.3 16.0  Hematocrit 38.4 - 49.9 % 42.1 43.8 46.5  Platelets 140 - 400 10e3/uL 170 199 193    . CMP Latest Ref Rng & Units 12/30/2016 12/16/2016 04/16/2016  Glucose 70 - 140 mg/dl 122 96 93  BUN 7.0 - 26.0 mg/dL 13.2 20.3 15  Creatinine 0.7 - 1.3 mg/dL 1.1 1.2 1.01  Sodium 136 - 145 mEq/L 135(L) 139 138  Potassium 3.5 - 5.1 mEq/L 4.2 4.4 4.6  Chloride 96 - 106 mmol/L - - 99  CO2 22 - 29 mEq/L 24 24 23   Calcium 8.4 - 10.4 mg/dL 9.3 9.7 9.4  Total Protein 6.4 - 8.3 g/dL 7.3 7.5 7.0  Total Bilirubin 0.20 - 1.20 mg/dL 0.73 0.80 0.6  Alkaline Phos 40 - 150 U/L 136 156(H) 154(H)  AST 5 - 34 U/L 18 22 21   ALT 0 - 55 U/L 17 30 23     PATHOLOGY:    RADIOGRAPHIC STUDIES: I have personally reviewed the radiological images as listed and agreed with the findings in the  report. Ir Gastrostomy Tube Mod Sed  Result Date: 12/25/2016 INDICATION: 66 year old with squamous cell carcinoma in the right tonsillar fossa. Port-A-Cath was placed immediately prior to placement of the gastrostomy tube. EXAM: PERCUTANEOUS GASTROSTOMY TUBE WITH FLUOROSCOPIC GUIDANCE Physician: Stephan Minister. Henn, MD MEDICATIONS: Ancef 2 g was started prior to placement of the Port-A-Cath. ANESTHESIA/SEDATION: Versed 1.5 mg IV; Fentanyl 125 mcg IV Moderate Sedation Time:  13 minutes The patient was continuously monitored during the procedure by the interventional radiology nurse under my direct supervision. FLUOROSCOPY TIME:  Fluoroscopy Time: 3 minutes 6 seconds (47 mGy). COMPLICATIONS: None immediate. PROCEDURE: Informed consent was obtained for a percutaneous gastrostomy tube. The patient was placed on the interventional table. Fluoroscopy demonstrated oral contrast in the transverse colon. An orogastric tube was placed with fluoroscopic guidance. The anterior abdomen was prepped and draped in sterile fashion. Maximal barrier sterile technique was utilized including caps, mask, sterile gowns, sterile gloves, sterile drape, hand hygiene and skin antiseptic. Stomach was inflated with air through the orogastric tube. The skin and subcutaneous tissues were anesthetized with 1% lidocaine. A 17 gauge needle was directed into the distended stomach with fluoroscopic guidance. A wire was advanced into the stomach and a T-tact was deployed. A 9-French vascular sheath was placed and the orogastric tube was snared using a Goos[place]ck snare device. The orogastric tube and snare were pulled out of the patient's mouth. The snare device was connected to a 20-French gastrostomy tube. The snare device and gastrostomy tube were pulled through the patient's mouth and out the anterior abdominal wall. The gastrostomy tube was cut to an appropriate length. Contrast injection through gastrostomy tube confirmed placement within the stomach.  Fluoroscopic images were obtained for documentation. The gastrostomy tube was flushed with normal saline. IMPRESSION: Successful fluoroscopic guided percutaneous gastrostomy tube placement. Electronically  Signed   By: Markus Daft M.D.   On: 12/25/2016 16:59   Nm Pet Image Initial (pi) Skull Base To Thigh  Result Date: 12/02/2016 CLINICAL DATA:  Initial treatment strategy for tonsillar cancer. EXAM: NUCLEAR MEDICINE PET SKULL BASE TO THIGH TECHNIQUE: 10.8 mCi F-18 FDG was injected intravenously. Full-ring PET imaging was performed from the skull base to thigh after the radiotracer. CT data was obtained and used for attenuation correction and anatomic localization. FASTING BLOOD GLUCOSE:  Value: 105 mg/dl COMPARISON:  CT neck 11/17/2016 FINDINGS: NECK Mildly asymmetric tonsillar activity along the right tonsillar sinus, maximum SUV 7.7 as compared to the left side which measures 5.2. The cystic lesion along the right lateral pharyngeal wall is not appreciably hypermetabolic. A right level IIa lymph node measures 1.2 cm in short axis on image 33/4 and has maximum SUV of 8.4, compatible with malignancy. Bilateral carotid atherosclerotic calcification. CHEST No hypermetabolic mediastinal or hilar nodes. No suspicious pulmonary nodules on the CT data. Paraseptal emphysema. Coronary, aortic arch, and branch vessel atherosclerotic vascular disease. Small type 1 hiatal hernia. ABDOMEN/PELVIS No abnormal hypermetabolic activity within the liver, pancreas, adrenal glands, or spleen. No hypermetabolic lymph nodes in the abdomen or pelvis. Faint accentuation of metabolic activity in the left lateral prostate gland apex, maximum SUV 4.5. Aortoiliac atherosclerotic vascular disease. Photopenic right kidney upper pole cyst. Left mid kidney cyst anteriorly. Diffuse colonic diverticulosis most severe in the sigmoid colon with associated wall thickening. Physiologic activity is observed in the bowel, especially the colon. No  hypermetabolic adenopathy observed. SKELETON No focal hypermetabolic activity to suggest skeletal metastasis. Right hip prosthesis. Transitional lumbosacral vertebra. Unusual sternal angulation, query old fracture or pectus carinatum. IMPRESSION: 1. Mildly asymmetric tonsillar activity along the right tonsillar sinus, maximum SUV 7.7 as compared to the left side 5.2, suspicious in this setting for right-sided malignancy. There is also a mildly enlarged and hypermetabolic right level IIa lymph node with maximum SUV of 8.4. No evidence of other metastatic spread. 2. Faint accentuation of activity in the left lateral prostate gland apex, but low-grade. Correlate with PSA level in determining whether further workup is warranted. 3. Other imaging findings of potential clinical significance: Aortic Atherosclerosis (ICD10-I70.0) and Emphysema (ICD10-J43.9). Coronary atherosclerosis. Small type 1 hiatal hernia. Bilateral renal cysts. Colonic diverticulosis. Electronically Signed   By: Van Clines M.D.   On: 12/02/2016 15:53   Ir US Guide Vasc Access Right  Result Date: 12/25/2016 INDICATION: 66 year old with squamous cell carcinoma at the right tonsillar fossa. Patient is scheduled for Port-A-Cath placement and gastrostomy tube placement. EXAM: FLUOROSCOPIC AND ULTRASOUND GUIDED PLACEMENT OF A SUBCUTANEOUS PORT COMPARISON:  None. MEDICATIONS: Ancef 2 g; The antibiotic was administered within an appropriate time interval prior to skin puncture. ANESTHESIA/SEDATION: Versed 2.5 mg IV; Fentanyl 125 mcg IV; Moderate Sedation Time:  30 minutes The patient was continuously monitored during the procedure by the interventional radiology nurse under my direct supervision. FLUOROSCOPY TIME:  42 seconds, 7 mGy COMPLICATIONS: None immediate. PROCEDURE: The procedure, risks, benefits, and alternatives were explained to the patient. Questions regarding the procedure were encouraged and answered. The patient understands and  consents to the procedure. Patient was placed supine on the interventional table. Ultrasound confirmed a patent right internal jugular vein. The right chest and neck were cleaned with a skin antiseptic and a sterile drape was placed. Maximal barrier sterile technique was utilized including caps, mask, sterile gowns, sterile gloves, sterile drape, hand hygiene and skin antiseptic. The right neck was anesthetized with  1% lidocaine. Small incision was made in the right neck with a blade. Micropuncture set was placed in the right internal jugular vein with ultrasound guidance. The micropuncture wire was used for measurement purposes. The right chest was anesthetized with 1% lidocaine with epinephrine. #15 blade was used to make an incision and a subcutaneous port pocket was formed. [place] was assembled. Subcutaneous tunnel was formed with a stiff tunneling device. The port catheter was brought through the subcutaneous tunnel. The port was placed in the subcutaneous pocket. The micropuncture set was exchanged for a peel-away sheath. The catheter was placed through the peel-away sheath and the tip was positioned at the superior cavoatrial junction. Catheter placement was confirmed with fluoroscopy. The port was accessed and flushed with heparinized saline. The port pocket was closed using two layers of absorbable sutures and Dermabond. The vein skin site was closed using a single layer of absorbable suture and Dermabond. Sterile dressings were applied. Patient tolerated the procedure well without an immediate complication. Ultrasound and fluoroscopic images were taken and saved for this procedure. IMPRESSION: Placement of a subcutaneous port device. Electronically Signed   By: Markus Daft M.D.   On: 12/25/2016 15:37   Mr Face/trigeminal Wo/w Cm  Result Date: 12/09/2016 CLINICAL DATA:  Tonsillar cancer.  Evaluation for perineural spread. Creatinine was obtained on site at [facility] at [address] Results: Creatinine 1.3 mg/dL. EXAM: MRI FACE TRIGEMINAL WITHOUT AND WITH CONTRAST TECHNIQUE: Multiplanar, multiecho pulse sequences of the face and surrounding structures, including thin slice imaging of the course of the Trigeminal Nerves, were obtained both before and after administration of intravenous contrast. CONTRAST:  58mL MULTIHANCE GADOBENATE DIMEGLUMINE 529 MG/ML IV SOLN COMPARISON:  None. FINDINGS: Visualized brain: Normal parenchymal signal. No extra-axial collection. Orbits: Normal optic nerves and extra-axial muscles. The globes are intact. Skull base: Meckel's cave is normal bilaterally. No abnormal enhancement in foramina rotundum or ovale. No abnormality of the internal auditory canals or jugular foramina. No abnormality at the hypoglossal canal. Paranasal sinuses and mastoids: Clear. Aerodigestive tract: There is an unchanged cystic spaces along the right wall of the pharynx. There is mild asymmetry of the palatine tonsils but no clear mass. IMPRESSION: 1. No evidence of perineural spread of malignancy. 2. Mild asymmetry of the palatine tonsils without discrete mass. Correlate with direct visualization. Electronically Signed   By: Ulyses Jarred M.D.   On: 12/09/2016 18:54   Ir Fluoro Guide Port Insertion Right  Result Date: 12/25/2016 INDICATION: 66 year old with squamous cell carcinoma at the right tonsillar fossa. Patient is scheduled for Port-A-Cath placement and gastrostomy tube placement. EXAM: FLUOROSCOPIC AND ULTRASOUND GUIDED PLACEMENT OF A SUBCUTANEOUS PORT COMPARISON:  None. MEDICATIONS: Ancef 2 g; The antibiotic was administered within an appropriate time interval prior to skin puncture. ANESTHESIA/SEDATION: Versed 2.5 mg IV; Fentanyl 125 mcg IV; Moderate Sedation Time:  30 minutes The patient was continuously monitored during the procedure by the interventional radiology nurse under my direct supervision. FLUOROSCOPY TIME:  42 seconds, 7 mGy COMPLICATIONS: None  immediate. PROCEDURE: The procedure, risks, benefits, and alternatives were explained to the patient. Questions regarding the procedure were encouraged and answered. The patient understands and consents to the procedure. Patient was placed supine on the interventional table. Ultrasound confirmed a patent right internal jugular vein. The right chest and neck were cleaned with a skin antiseptic and a sterile drape was placed. Maximal barrier sterile technique was utilized including caps, mask, sterile gowns, sterile gloves, sterile drape, hand  hygiene and skin antiseptic. The right neck was anesthetized with 1% lidocaine. Small incision was made in the right neck with a blade. Micropuncture set was placed in the right internal jugular vein with ultrasound guidance. The micropuncture wire was used for measurement purposes. The right chest was anesthetized with 1% lidocaine with epinephrine. #15 blade was used to make an incision and a subcutaneous port pocket was formed. [place] was assembled. Subcutaneous tunnel was formed with a stiff tunneling device. The port catheter was brought through the subcutaneous tunnel. The port was placed in the subcutaneous pocket. The micropuncture set was exchanged for a peel-away sheath. The catheter was placed through the peel-away sheath and the tip was positioned at the superior cavoatrial junction. Catheter placement was confirmed with fluoroscopy. The port was accessed and flushed with heparinized saline. The port pocket was closed using two layers of absorbable sutures and Dermabond. The vein skin site was closed using a single layer of absorbable suture and Dermabond. Sterile dressings were applied. Patient tolerated the procedure well without an immediate complication. Ultrasound and fluoroscopic images were taken and saved for this procedure. IMPRESSION: Placement of a subcutaneous port device. Electronically Signed   By: Markus Daft M.D.   On: 12/25/2016 15:37     ASSESSMENT & PLAN:  66 y.o. male presenting with:   1) Newly diagnosed rt Rt tonsillar Squamous cell carcinoma with cTx cN1 disease patient case was discussed in tumor board and given concern for nerve involvement was thought not to be a good candidate for primary surgery due to concerns for significant post-Sx morbidity. -creatinine WNL. Some concern for decreased hearing and baseline tinnitus due to previous job noise related injury. Baseline audiogram showed normal symmetric low-frequency hearing, dropping off substantially above 2000Hz .   Plan -will plan to treat with concurrent chemotherapy (Cisplatin weekly) with concurrent RT. Started today. -I discussed and recommended with the patient, his wife, and his sister regarding cisplatin based chemotherapy at lower sensitizing doses without additional chemotherapy drugs.  -Will get fluids and appropriate electrolyte replacement prior to cisplatin administration and fluids and electrolytes following cisplatin administration -will monitor kidney and electrolytes levels with cisplatin therapy -Discussed with patient, pt wife regarding importance of maintaining nutrition during chemotherapy and radiotherapy treatments. They have follow up with Ernestene Kiel in the near future about his nutritional status.  -will need rpt audiogram after 3-4 weekly doses to monitor changes with Cisplatin. -Labs are stable, he will proceed with cycle 1 Cisplatin today. Also obtained labs today to establish kidney and electrolyte baseline.  -Additionally, we will prescribe him magic mouth wash, sucralfate, and prn Oxycodone to assist with throat pain that he may subsequently need with mucositis from RT + chemotherapy treatments. -I did inform him of possibly experiencing diarrhea, constipation, and nausea throughout his treatments. I informed him that he can begin anti-diarrhea, laxatives, or prn anti-emetics if any of these will become an issue.    Continue weekly  Cisplatin as per orders RTC with Dr Irene Limbo in 1 week with labs  F/u with Ernestene Kiel for tube feeding recommendations   All of the patients questions were answered with apparent satisfaction. The patient knows to call the clinic with any problems, questions or concerns.  I spent 30 minutes counseling the patient face to face. The total time spent in the appointment was 40 minutes and more than 50% was on counseling and direct patient cares.   .I have reviewed the above documentation for accuracy and completeness, and  I agree with the above.  Sullivan Lone MD [place] AAHIVMS [facility] Hematology/Oncology [facility]  (Office):       [phone] (Work cell):  [phone] (Fax):           [phone]  12/30/2016 9:23 AM  This document serves as a record of services personally performed by Sullivan Lone, MD. It was created on his behalf by Reola Mosher, a trained medical scribe. The creation of this record is based on the scribe's personal observations and the provider's statements to them. This document has been checked and approved by the attending provider.

## 2016-12-30 NOTE — Patient Instructions (Signed)
[facility] Discharge Instructions for Patients Receiving Chemotherapy  Today you received the following chemotherapy agents: Cisplatin.  To help prevent nausea and vomiting after your treatment, we encourage you to take your nausea medication: Compazine. May take Zofran 3 days after treatment if needed for nausea.   If you develop nausea and vomiting that is not controlled by your nausea medication, call the clinic.   BELOW ARE SYMPTOMS THAT SHOULD BE REPORTED IMMEDIATELY:  *FEVER GREATER THAN 100.5 F  *CHILLS WITH OR WITHOUT FEVER  NAUSEA AND VOMITING THAT IS NOT CONTROLLED WITH YOUR NAUSEA MEDICATION  *UNUSUAL SHORTNESS OF BREATH  *UNUSUAL BRUISING OR BLEEDING  TENDERNESS IN MOUTH AND THROAT WITH OR WITHOUT PRESENCE OF ULCERS  *URINARY PROBLEMS  *BOWEL PROBLEMS  UNUSUAL RASH Items with * indicate a potential emergency and should be followed up as soon as possible.  Feel free to call the clinic should you have any questions or concerns. The clinic phone number is [phone].  Please show the [place] at check-in to the Emergency Department and triage nurse.

## 2016-12-31 ENCOUNTER — Ambulatory Visit
Admission: RE | Admit: 2016-12-31 | Discharge: 2016-12-31 | Disposition: A | Discharge status: Home / Self Care | Payer: 59 | Source: Ambulatory Visit | Attending: Radiation Oncology | Admitting: Radiation Oncology

## 2016-12-31 DIAGNOSIS — Z510 Encounter for antineoplastic radiation therapy: Secondary | ICD-10-CM | POA: Diagnosis not present

## 2017-01-01 ENCOUNTER — Ambulatory Visit
Admission: RE | Admit: 2017-01-01 | Discharge: 2017-01-01 | Disposition: A | Discharge status: Home / Self Care | Payer: 59 | Source: Ambulatory Visit | Attending: Radiation Oncology | Admitting: Radiation Oncology

## 2017-01-01 ENCOUNTER — Encounter: Payer: Self-pay | Admitting: *Deleted

## 2017-01-01 DIAGNOSIS — Z510 Encounter for antineoplastic radiation therapy: Secondary | ICD-10-CM | POA: Diagnosis not present

## 2017-01-01 NOTE — Progress Notes (Signed)
Telephone call to patient to follow up on Chemo 11/5. Left message for return call.

## 2017-01-02 ENCOUNTER — Ambulatory Visit
Admission: RE | Admit: 2017-01-02 | Discharge: 2017-01-02 | Disposition: A | Discharge status: Home / Self Care | Payer: 59 | Source: Ambulatory Visit | Attending: Radiation Oncology | Admitting: Radiation Oncology

## 2017-01-02 ENCOUNTER — Telehealth: Payer: Self-pay | Admitting: *Deleted

## 2017-01-02 DIAGNOSIS — Z510 Encounter for antineoplastic radiation therapy: Secondary | ICD-10-CM | POA: Diagnosis not present

## 2017-01-02 DIAGNOSIS — C090 Malignant neoplasm of tonsillar fossa: Secondary | ICD-10-CM

## 2017-01-02 MED ORDER — SONAFINE EX EMUL
1.0000 "application " | Freq: Once | CUTANEOUS | Status: AC
  Administered 2017-01-02: 1 via TOPICAL

## 2017-01-02 NOTE — Telephone Encounter (Signed)
Oncology Nurse Navigator Documentation  Returned call to patient wife, provided clarification of 11/13 appts.  She voiced understanding.  Gayleen Orem, RN, BSN, [place] Oncology Nurse [place] at [place] [phone]

## 2017-01-02 NOTE — Progress Notes (Signed)

## 2017-01-05 ENCOUNTER — Ambulatory Visit
Admission: RE | Admit: 2017-01-05 | Discharge: 2017-01-05 | Disposition: A | Discharge status: Home / Self Care | Payer: 59 | Source: Ambulatory Visit | Attending: Radiation Oncology | Admitting: Radiation Oncology

## 2017-01-05 ENCOUNTER — Other Ambulatory Visit: Payer: Self-pay | Admitting: Radiation Oncology

## 2017-01-05 DIAGNOSIS — C090 Malignant neoplasm of tonsillar fossa: Secondary | ICD-10-CM

## 2017-01-05 DIAGNOSIS — Z510 Encounter for antineoplastic radiation therapy: Secondary | ICD-10-CM | POA: Diagnosis not present

## 2017-01-05 MED ORDER — LIDOCAINE VISCOUS 2 % MT SOLN: OROMUCOSAL | 5 refills | Status: DC

## 2017-01-05 MED FILL — LIDOCAINE 2% VISCOUS SOLN: 2 | 3 days supply | Qty: 100 | Fill #0

## 2017-01-06 ENCOUNTER — Ambulatory Visit
Admission: RE | Admit: 2017-01-06 | Discharge: 2017-01-06 | Disposition: A | Discharge status: Home / Self Care | Payer: 59 | Source: Ambulatory Visit | Attending: Radiation Oncology | Admitting: Radiation Oncology

## 2017-01-06 ENCOUNTER — Ambulatory Visit (HOSPITAL_BASED_OUTPATIENT_CLINIC_OR_DEPARTMENT_OTHER): Payer: 59

## 2017-01-06 ENCOUNTER — Ambulatory Visit: Payer: 59 | Admitting: Physical Therapy

## 2017-01-06 ENCOUNTER — Ambulatory Visit: Payer: 59 | Attending: Radiation Oncology

## 2017-01-06 ENCOUNTER — Ambulatory Visit: Payer: 59 | Admitting: Nutrition

## 2017-01-06 ENCOUNTER — Encounter: Payer: Self-pay | Admitting: *Deleted

## 2017-01-06 ENCOUNTER — Other Ambulatory Visit: Payer: Self-pay

## 2017-01-06 VITALS — BP 145/85 | HR 80 | Temp 97.6°F | Resp 20 | Wt 211.5 lb

## 2017-01-06 VITALS — BP 128/75 | HR 76 | Temp 98.4°F | Resp 18

## 2017-01-06 DIAGNOSIS — R1310 Dysphagia, unspecified: Secondary | ICD-10-CM | POA: Diagnosis present

## 2017-01-06 DIAGNOSIS — C090 Malignant neoplasm of tonsillar fossa: Secondary | ICD-10-CM | POA: Diagnosis not present

## 2017-01-06 DIAGNOSIS — Z9189 Other specified personal risk factors, not elsewhere classified: Secondary | ICD-10-CM | POA: Insufficient documentation

## 2017-01-06 DIAGNOSIS — R293 Abnormal posture: Secondary | ICD-10-CM | POA: Insufficient documentation

## 2017-01-06 DIAGNOSIS — C099 Malignant neoplasm of tonsil, unspecified: Secondary | ICD-10-CM

## 2017-01-06 DIAGNOSIS — Z510 Encounter for antineoplastic radiation therapy: Secondary | ICD-10-CM | POA: Diagnosis not present

## 2017-01-06 DIAGNOSIS — Z5111 Encounter for antineoplastic chemotherapy: Secondary | ICD-10-CM | POA: Diagnosis not present

## 2017-01-06 DIAGNOSIS — Z7189 Other specified counseling: Secondary | ICD-10-CM

## 2017-01-06 LAB — COMPREHENSIVE METABOLIC PANEL
ALT: 47 U/L (ref 0–55)
ANION GAP: 7 meq/L (ref 3–11)
AST: 24 U/L (ref 5–34)
Albumin: 3.4 g/dL — ABNORMAL LOW (ref 3.5–5.0)
Alkaline Phosphatase: 101 U/L (ref 40–150)
BUN: 14.9 mg/dL (ref 7.0–26.0)
CALCIUM: 8.7 mg/dL (ref 8.4–10.4)
CHLORIDE: 100 meq/L (ref 98–109)
CO2: 25 mEq/L (ref 22–29)
Creatinine: 1.1 mg/dL (ref 0.7–1.3)
EGFR: >60 mL/min/{1.73_m2} (ref >60)
Glucose: 99 mg/dl (ref 70–140)
POTASSIUM: 4 meq/L (ref 3.5–5.1)
Sodium: 132 mEq/L — ABNORMAL LOW (ref 136–145)
Total Bilirubin: 0.73 mg/dL (ref 0.20–1.20)
Total Protein: 6.4 g/dL (ref 6.4–8.3)

## 2017-01-06 LAB — CBC & DIFF AND RETIC
BASO%: 0.1 % (ref 0.0–2.0)
Basophils Absolute: 0 10*3/uL (ref 0.0–0.1)
EOS ABS: 0.3 10*3/uL (ref 0.0–0.5)
EOS%: 3 % (ref 0.0–7.0)
HCT: 42.5 % (ref 38.4–49.9)
HGB: 14.8 g/dL (ref 13.0–17.1)
IMMATURE RETIC FRACT: 5.3 % (ref 3.00–10.60)
LYMPH#: 1.1 10*3/uL (ref 0.9–3.3)
LYMPH%: 11.8 % — ABNORMAL LOW (ref 14.0–49.0)
MCH: 31 pg (ref 27.2–33.4)
MCHC: 34.8 g/dL (ref 32.0–36.0)
MCV: 89.1 fL (ref 79.3–98.0)
MONO#: 0.9 10*3/uL (ref 0.1–0.9)
MONO%: 9.4 % (ref 0.0–14.0)
NEUT%: 75.7 % — ABNORMAL HIGH (ref 39.0–75.0)
NEUTROS ABS: 7.1 10*3/uL — AB (ref 1.5–6.5)
NRBC: 0 % (ref 0–0)
Platelets: 170 10*3/uL (ref 140–400)
RBC: 4.77 10*6/uL (ref 4.20–5.82)
RDW: 12.6 % (ref 11.0–14.6)
RETIC %: 0.43 % — AB (ref 0.80–1.80)
RETIC CT ABS: 20.51 10*3/uL — AB (ref 34.80–93.90)
WBC: 9.3 10*3/uL (ref 4.0–10.3)

## 2017-01-06 LAB — MAGNESIUM: Magnesium: 2.1 mg/dl (ref 1.5–2.5)

## 2017-01-06 MED ORDER — POTASSIUM CHLORIDE 2 MEQ/ML IV SOLN
Freq: Once | INTRAVENOUS | Status: AC
  Administered 2017-01-06: 11:00:00 via INTRAVENOUS
  Filled 2017-01-06: qty 10

## 2017-01-06 MED ORDER — HEPARIN SOD (PORK) LOCK FLUSH 100 UNIT/ML IV SOLN
500.0000 [IU] | Freq: Once | INTRAVENOUS | Status: AC | PRN
  Administered 2017-01-06: 500 [IU]
  Filled 2017-01-06: qty 5

## 2017-01-06 MED ORDER — PALONOSETRON HCL INJECTION 0.25 MG/5ML
0.2500 mg | Freq: Once | INTRAVENOUS | Status: AC
  Administered 2017-01-06: 0.25 mg via INTRAVENOUS

## 2017-01-06 MED ORDER — SODIUM CHLORIDE 0.9 % IV SOLN
40.0000 mg/m2 | Freq: Once | INTRAVENOUS | Status: AC
  Administered 2017-01-06: 86 mg via INTRAVENOUS
  Filled 2017-01-06: qty 86

## 2017-01-06 MED ORDER — SODIUM CHLORIDE 0.9% FLUSH
10.0000 mL | INTRAVENOUS | Status: DC | PRN
  Administered 2017-01-06: 10 mL
  Filled 2017-01-06: qty 10

## 2017-01-06 MED ORDER — SODIUM CHLORIDE 0.9 % IV SOLN
Freq: Once | INTRAVENOUS | Status: AC
  Administered 2017-01-06: 13:00:00 via INTRAVENOUS

## 2017-01-06 MED ORDER — FOSAPREPITANT DIMEGLUMINE INJECTION 150 MG
Freq: Once | INTRAVENOUS | Status: AC
  Administered 2017-01-06: 13:00:00 via INTRAVENOUS
  Filled 2017-01-06: qty 5

## 2017-01-06 NOTE — Therapy (Signed)
[facility] [address] [place], [place], [address] Phone: [phone]   Fax:  [phone]  Speech Language Pathology Evaluation  Patient Details  Name: Terry Pacheco MRN: [id number] Date of Birth: 11-20-1950 Referring Provider: Eppie Gibson, MD   Encounter Date: 01/06/2017  End of Session - 01/06/17 1151    Visit Number  1    Number of Visits  3    Date for SLP Re-Evaluation  03/13/17    SLP Start Time  0830    SLP Stop Time   0915    SLP Time Calculation (min)  45 min    Activity Tolerance  Patient tolerated treatment well       Past Medical History:  Diagnosis Date  . Arthritis   . GERD (gastroesophageal reflux disease) 07/06/2014  . Hypertension   . Sleep apnea    does not use CPAP  . Wears glasses     Past Surgical History:  Procedure Laterality Date  . HERNIA REPAIR    . IR FLUORO GUIDE PORT INSERTION RIGHT  12/25/2016  . IR GASTROSTOMY TUBE MOD SED  12/25/2016  . IR US GUIDE VASC ACCESS RIGHT  12/25/2016  . polyp removal  2008   during colonoscopy  . vocal cord polyp      removed 2-3 years ago     There were no vitals filed for this visit.  Subjective Assessment - 01/06/17 0844    Subjective  Pt denies current dysphagia.    Patient is accompained by:  Family member wife    Currently in Pain?  No/denies         SLP Evaluation [facility] - 01/06/17 0844      SLP Visit Information   SLP Received On  01/06/17    Referring Provider  Eppie Gibson, MD    Onset Date  11-25-15    Medical Diagnosis  Tonsillar CA      Prior Functional Status   Cognitive/Linguistic Baseline  Within functional limits      Cognition   Overall Cognitive Status  Within Functional Limits for tasks assessed      Auditory Comprehension   Overall Auditory Comprehension  Appears within functional limits for tasks assessed      Verbal Expression   Overall Verbal Expression  Appears within functional limits for tasks  assessed      Oral Motor/Sensory Function   Overall Oral Motor/Sensory Function  Appears within functional limits for tasks assessed      Motor Speech   Overall Motor Speech  Appears within functional limits for tasks assessed        Pt currently tolerates regular diet with thin liquids. POs: Pt ate [place] sandwich and drank water without overt s/s aspiration. Thyroid elevation appeared adequate, and swallows appeared timely. Oral residue noted as minimal/WNL. Pt's swallow deemed WNL at this time.   Because data states the risk for dysphagia during and after radiation treatment is high due to undergoing radiation tx, SLP taught pt about the possibility of reduced/limited ability for PO intake during rad tx. SLP encouraged pt to continue swallowing POs as far into rad tx as possible, even ingesting POs and/or completing HEP shortly after administration of pain meds.   SLP educated pt re: changes to swallowing musculature after rad tx, and why adherence to dysphagia HEP provided today and PO consumption was necessary to inhibit muscular disuse atrophy and to reduce muscle fibrosis following rad tx. Pt demonstrated understanding of these things  to SLP.    SLP then developed a HEP for pt and pt was instructed how to perform exercises involving lingual, vocal, and pharyngeal strengthening. SLP performed each exercise and pt return demonstrated each exercise. SLP ensured pt performance was correct prior to moving on to next exercise. Pt was instructed to complete this program 2-3 times a day, 6-7 days/week until 6 months after his or her last rad tx, then x2 a week after that.                SLP Education - 01/06/17 1150    Education provided  Yes    Education Details  HEP procedure, late effects head/neck radiaiton on swallowing ability    Person(s) Educated  Patient;Spouse    Methods  Explanation;Demonstration;Verbal cues;Handout    Comprehension  Verbalized understanding;Returned  demonstration;Verbal cues required       SLP Short Term Goals - 01/06/17 1157      SLP SHORT TERM GOAL #1   Title  pt will complete HEP with rare min A     Time  1    Period  -- visit    Status  New      SLP SHORT TERM GOAL #2   Title  pt will tell SLP why he is completing HEP     Time  1    Period  -- visit    Status  New      SLP SHORT TERM GOAL #3   Title  pt will tell SLP 3 overt s/s aspiration PNA with modified independence    Time  1    Period  -- visit    Status  New       SLP Long Term Goals - 01/06/17 1158      SLP LONG TERM GOAL #1   Title  pt will tell SLP why food journal can be helpful in returning to least restrictive food consistencies    Time  2    Period  -- vistis    Status  New      SLP LONG TERM GOAL #2   Title  pt will complete HEP with modified independence    Time  2    Period  -- visits    Status  New      SLP LONG TERM GOAL #3   Title  pt will tell SLP when HEP frequency can be reduced to x2-3/week    Time  2    Period  -- visits    Status  New       Plan - 01/06/17 1151    Clinical Impression Statement  Pt with oropharyngeal swallowing essentially WNL, however the probability of swallowing difficulty increases dramatically with the initiation of chemo and radiation therapy. Pt will need to be followed by SLP for regular assessment of accurate HEP completion as well as for safety with POs both during and following treatment/s.    Speech Therapy Frequency  -- approx once every 4 weeks    Duration  -- 3 total visits    Treatment/Interventions  Aspiration precaution training;Pharyngeal strengthening exercises;Diet toleration management by SLP;Environmental controls;Compensatory techniques;Cueing hierarchy;SLP instruction and feedback;Multimodal communcation approach;Internal/external aids;Trials of upgraded texture/liquids;Patient/family education any or all will be used    Potential to Achieve Goals  Good    SLP Home Exercise Plan  provided  today    Consulted and Agree with Plan of Care  Patient       Patient will benefit from skilled  therapeutic intervention in order to improve the following deficits and impairments:   Dysphagia, unspecified type  G-Codes - 02-04-2017 1200    Functional Assessment Tool Used  noms, clinical judgment    Functional Limitations  Swallowing    Swallow Current Status (C3762)  0 percent impaired, limited or restricted    Swallow Goal Status (G3151)  At least 1 percent but less than 20 percent impaired, limited or restricted       Problem List Patient Active Problem List   Diagnosis Date Noted  . Counseling regarding advanced care planning and goals of care 12/22/2016  . 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 Osteoarthritis of right hip 04/05/2015  . GAD (generalized anxiety disorder) 04/05/2015  . Metabolic syndrome 76/16/0737  . Obese 04/05/2015  . GERD (gastroesophageal reflux disease) 07/06/2014  . Hypertension 03/14/2013  . Hyperlipidemia with target LDL less than 100 03/14/2013    Sehaj Mcenroe ,[place], CCC-SLP  2017/02/04, 12:01 PM  [facility] [address] [place], [place], [address] Phone: [phone]   Fax:  [phone]  Name: Terry Pacheco MRN: [id number] Date of Birth: September 11, 1950

## 2017-01-06 NOTE — Patient Instructions (Signed)
[facility] Discharge Instructions for Patients Receiving Chemotherapy  Today you received the following chemotherapy agents Cisplatin  To help prevent nausea and vomiting after your treatment, we encourage you to take your nausea medication as directed  If you develop nausea and vomiting that is not controlled by your nausea medication, call the clinic.   BELOW ARE SYMPTOMS THAT SHOULD BE REPORTED IMMEDIATELY:  *FEVER GREATER THAN 100.5 F  *CHILLS WITH OR WITHOUT FEVER  NAUSEA AND VOMITING THAT IS NOT CONTROLLED WITH YOUR NAUSEA MEDICATION  *UNUSUAL SHORTNESS OF BREATH  *UNUSUAL BRUISING OR BLEEDING  TENDERNESS IN MOUTH AND THROAT WITH OR WITHOUT PRESENCE OF ULCERS  *URINARY PROBLEMS  *BOWEL PROBLEMS  UNUSUAL RASH Items with * indicate a potential emergency and should be followed up as soon as possible.  Feel free to call the clinic should you have any questions or concerns. The clinic phone number is [phone].  Please show the CHEMO ALERT CARD at check-in to the Emergency Department and triage nurse.   

## 2017-01-06 NOTE — Progress Notes (Signed)
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  He is a patient of Dr. Isidore Moos Dr. Irene Limbo.  Past medical history includes GERD, hypertension, hypercholesterolemia, sleep apnea.  Medications include Lipitor, Decadron, multivitamin, Prilosec, Zofran, Compazine, and turmeric.  Labs include sodium 135 on November 6.  Height: 69 inches. Weight: 211.5 pounds on November 13 Usual body weight: 210 pounds. BMI: 31.23.  Patient is receiving concurrent cisplatin and radiation therapy. He is status post PEG on November 1. He reports no nutrition impact symptoms after her first treatment of cisplatin. He is eating well at this time. Reports he is flushing his feeding tube daily.  Estimated nutrition needs: 2200-2400 cal, 115-125 grams protein, 2.4 L fluid.  Nutrition diagnosis:  Predicted suboptimal energy intake related to tonsil cancer and associated treatments as evidenced by a condition for which research shows history of inadequate oral intake.  Intervention: Educated patient to consume small frequent meals of high-calorie, high-protein foods. Patient encouraged to strive for weight maintenance. Reviewed high-calorie, high-protein foods and encouraged Carnation breakfast essentials twice a day between meals. Patient was provided with fact sheets.  He has my contact information.  Questions were answered and teach back method used.  Monitoring, evaluation, goals: Patient will tolerate increased calories and protein for weight maintenance.  Next visit: Tuesday, November 20, during infusion.  **Disclaimer: This note was dictated with voice recognition software. Similar sounding words can inadvertently be transcribed and this note may contain transcription errors which may not have been corrected upon publication of note.**

## 2017-01-06 NOTE — Therapy (Signed)
[facility], [place], [address] Phone: [phone]   Fax:  [phone]  Physical Therapy Evaluation  Patient Details  Name: Terry Pacheco MRN: [id number] Date of Birth: 02-Jun-1950 Referring Provider: Dr. Eppie Gibson   Encounter Date: 01/06/2017  PT End of Session - 01/06/17 2018    Visit Number  1    Number of Visits  1    PT Start Time  0920    PT Stop Time  0935    PT Time Calculation (min)  15 min    Activity Tolerance  Patient tolerated treatment well    Behavior During Therapy  [facility] for tasks assessed/performed       Past Medical History:  Diagnosis Date  . Arthritis   . GERD (gastroesophageal reflux disease) 07/06/2014  . Hypertension   . Sleep apnea    does not use CPAP  . Wears glasses     Past Surgical History:  Procedure Laterality Date  . HERNIA REPAIR    . IR FLUORO GUIDE PORT INSERTION RIGHT  12/25/2016  . IR GASTROSTOMY TUBE MOD SED  12/25/2016  . IR US GUIDE VASC ACCESS RIGHT  12/25/2016  . polyp removal  2008   during colonoscopy  . vocal cord polyp      removed 2-3 years ago     There were no vitals filed for this visit.   Subjective Assessment - 01/06/17 1959    Subjective  Having some neck problems recently--"It's just old age."    Patient is accompained by:  Family member wife    Pertinent History  CT of neck on 11/17/16 showed 7 x 9 mm "cyst" in the right lateral pharyngeal wall. Pt. with right tonsil squamous cell carcinoma, p16 positive.  Plan is for chemoradiation with XRT to tumor and bilateral neck nodes in 35 fractions, chemo weekly cisplatin, started 12/30/16.  h/o right THA 2017. HTN.    Patient Stated Goals  get info from all clinic providers    Currently in Pain?  No/denies         [facility] PT Assessment - 01/06/17 2003      Assessment   Medical Diagnosis  right tonsil squamous cell carcinoma, p16 positive    Referring Provider  Dr. Eppie Gibson    Onset  Date/Surgical Date  11/17/16    Prior Therapy  none      Precautions   Precautions  Other (comment)    Precaution Comments  cancer precautions      Restrictions   Weight Bearing Restrictions  No      Balance Screen   Has the patient fallen in the past 6 months  No    Has the patient had a decrease in activity level because of a fear of falling?   No    Is the patient reluctant to leave their home because of a fear of falling?   No      Home Environment   Living Environment  Private residence    Living Arrangements  Spouse/significant other    Type of [place] or work area in basement      Prior Function   Level of [place]  works in the yard some; riding Patent attorney   Overall Cognitive Status  Within [place] for tasks assessed      Observation/Other Assessments   Observations  unremarkable      Functional Tests   Functional tests  Sit to Stand      Sit to Stand   Comments  14 times in 30 seconds, above average for age      Posture/Postural Control   Posture/Postural Control  Postural limitations    Postural Limitations  Forward head      ROM / Strength   AROM / PROM / Strength  AROM      AROM   Overall AROM Comments  neck and shoulder A/ROM bilat. [facility]      Ambulation/Gait   Ambulation/Gait  Yes    Ambulation/Gait Assistance  7: Independent per pt. report        LYMPHEDEMA/ONCOLOGY QUESTIONNAIRE - 01/06/17 2015      Type   Cancer Type  Rt. tonsil squamous cell      Treatment   Active Radiation Treatment  Yes    Date  12/30/16    Body Site  neck      Lymphedema Assessments   Lymphedema Assessments  Head and Neck      Head and Neck   4 cm superior to sternal notch around neck  43 cm    6 cm superior to sternal notch around neck  43.9 cm    8 cm superior to sternal notch around neck  45.4 cm    Other  47 cm. at 10 cm. superior to sternal notch          Objective  measurements completed on examination: See above findings.              PT Education - 01/06/17 2017    Education provided  Yes    Education Details  neck ROM, posture, breathing, walking, CURE article on staying active, "Why exercise?" flyer, lymphedema and PT info    Person(s) Educated  Patient;Spouse    Methods  Explanation;Handout    Comprehension  Verbalized understanding              Head and Neck Clinic Goals - 01/06/17 2024      Patient will be able to verbalize understanding of a home exercise program for cervical range of motion, posture, and walking.    Status  Achieved      Patient will be able to verbalize understanding of proper sitting and standing posture.    Status  Achieved      Patient will be able to verbalize understanding of lymphedema risk and availability of treatment for this condition.    Status  Achieved         Plan - 01/06/17 2019    Clinical Impression Statement  Pleasant gentleman accompanied by his wife.  Diagnosis is Rt. tonsil squamous cell carcinoma, p16 positive, and treatment plan is chemoradiation.  He started 12/30/16. h/o Rt. THA 2017.    History and Personal Factors relevant to plan of care:  HTN    Clinical Presentation  Evolving    Clinical Presentation due to:  just started chemoradiation treatment    Clinical Decision Making  Low    Rehab Potential  Excellent    PT Frequency  One time visit    PT Treatment/Interventions  Patient/family education    PT Next Visit Plan  None planned right now; will follow should lymphedema develop.    PT Home Exercise Plan  neck ROM, walking    Consulted and Agree with Plan of Care  Patient       Patient will benefit from  skilled therapeutic intervention in order to improve the following deficits and impairments:  Decreased knowledge of precautions, Postural dysfunction  Visit Diagnosis: Squamous cell carcinoma of right tonsil ([place]) - Plan: PT plan of care  cert/re-cert  At risk for lymphedema - Plan: PT plan of care cert/re-cert  Abnormal posture - Plan: PT plan of care cert/re-cert  G-Codes - 85/46/27 2022-06-18    Functional Assessment Tool Used (Outpatient Only)  clinical judgement    Functional Limitation  Changing and maintaining body position    Changing and Maintaining Body Position Current Status (O3500)  At least 1 percent but less than 20 percent impaired, limited or restricted    Changing and Maintaining Body Position Goal Status (X3818)  At least 1 percent but less than 20 percent impaired, limited or restricted    Changing and Maintaining Body Position Discharge Status (E9937)  At least 1 percent but less than 20 percent impaired, limited or restricted        Problem List Patient Active Problem List   Diagnosis Date Noted  . Counseling regarding advanced care planning and goals of care 12/22/2016  . Carcinoma of tonsillar fossa ([place]) 12/17/2016  . 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 Osteoarthritis of right hip 04/05/2015  . GAD (generalized anxiety disorder) 04/05/2015  . Metabolic syndrome 16/96/7893  . Obese 04/05/2015  . GERD (gastroesophageal reflux disease) 07/06/2014  . Hypertension 03/14/2013  . Hyperlipidemia with target LDL less than 100 03/14/2013    SALISBURY,DONNA 01/06/2017, 8:28 PM  [facility] [place], [place], [address] Phone: [phone]   Fax:  [phone]  Name: ORSON RHO MRN: [id number] Date of Birth: 1951/02/13  Serafina Royals, PT 01/06/17 8:29 PM

## 2017-01-06 NOTE — Patient Instructions (Signed)
SWALLOWING EXERCISES Do these 6 of the 7 days per week until 6 months after your last day of radiation, then 2 times per week afterwards  1. Effortful Swallows - Press your tongue against the roof of your mouth for 3 seconds, then squeeze          the muscles in your neck while you swallow your saliva or a sip of water - Repeat 20 times, 2-3 times a day, and use whenever you eat or drink  2. Masako Swallow - swallow with your tongue sticking out - Stick tongue out past your teeth and gently bite tongue with your teeth - Swallow, while holding your tongue with your teeth - Repeat 20 times, 2-3 times a day *use a wet spoon if your mouth gets dry*  3. Pitch Raise -optional - Repeat "he", once per second in as high of a pitch as you can - Repeat 20 times, 2-3 times a day  4. Shaker Exercise - head lift - Lie flat on your back in your bed or on a couch without pillows - Raise your head and look at your feet - KEEP YOUR SHOULDERS DOWN - HOLD FOR 45-60 SECONDS, then lower your head back down - Repeat 3 times, 2-3 times a day  5. Mendelsohn Maneuver - "half swallow" exercise - Start to swallow, and keep your Adam's apple up by squeezing hard with the            muscles of the throat - Hold the squeeze for 5-7 seconds and then relax - Repeat 20 times, 2-3 times a day *use a wet spoon if your mouth gets dry*  6. Breath Hold - Say "HUH!" loudly, then hold your breath for 3 seconds at your voice box - Repeat 20 times, 2-3 times a day  7. Chin pushback - Open your mouth  - Place your fist UNDER your chin near your neck, and push back with your fist for 5 seconds - Repeat 10 times, 2-3 times a day

## 2017-01-07 ENCOUNTER — Telehealth: Payer: Self-pay | Admitting: Hematology

## 2017-01-07 ENCOUNTER — Ambulatory Visit
Admission: RE | Admit: 2017-01-07 | Discharge: 2017-01-07 | Disposition: A | Discharge status: Home / Self Care | Payer: 59 | Source: Ambulatory Visit | Attending: Radiation Oncology | Admitting: Radiation Oncology

## 2017-01-07 DIAGNOSIS — Z510 Encounter for antineoplastic radiation therapy: Secondary | ICD-10-CM | POA: Diagnosis not present

## 2017-01-07 LAB — PHOSPHORUS: PHOSPHORUS: 4.5 mg/dL (ref 2.5–4.5)

## 2017-01-07 NOTE — Telephone Encounter (Signed)
Added follow up visit to 11/20 appointments and adjusted lab time to 8:30 am. Spoke with wife she is aware.

## 2017-01-08 ENCOUNTER — Ambulatory Visit
Admission: RE | Admit: 2017-01-08 | Discharge: 2017-01-08 | Disposition: A | Discharge status: Home / Self Care | Payer: 59 | Source: Ambulatory Visit | Attending: Radiation Oncology | Admitting: Radiation Oncology

## 2017-01-08 DIAGNOSIS — Z510 Encounter for antineoplastic radiation therapy: Secondary | ICD-10-CM | POA: Diagnosis not present

## 2017-01-09 ENCOUNTER — Ambulatory Visit
Admission: RE | Admit: 2017-01-09 | Discharge: 2017-01-09 | Disposition: A | Discharge status: Home / Self Care | Payer: 59 | Source: Ambulatory Visit | Attending: Radiation Oncology | Admitting: Radiation Oncology

## 2017-01-09 ENCOUNTER — Encounter: Payer: Self-pay | Admitting: Nutrition

## 2017-01-09 DIAGNOSIS — Z510 Encounter for antineoplastic radiation therapy: Secondary | ICD-10-CM | POA: Diagnosis not present

## 2017-01-11 ENCOUNTER — Ambulatory Visit
Admission: RE | Admit: 2017-01-11 | Discharge: 2017-01-11 | Disposition: A | Discharge status: Home / Self Care | Payer: 59 | Source: Ambulatory Visit | Attending: Radiation Oncology | Admitting: Radiation Oncology

## 2017-01-11 ENCOUNTER — Other Ambulatory Visit: Payer: Self-pay | Admitting: Radiation Oncology

## 2017-01-11 DIAGNOSIS — C090 Malignant neoplasm of tonsillar fossa: Secondary | ICD-10-CM

## 2017-01-11 DIAGNOSIS — Z510 Encounter for antineoplastic radiation therapy: Secondary | ICD-10-CM | POA: Diagnosis not present

## 2017-01-11 MED ORDER — FLUCONAZOLE 100 MG PO TABS: ORAL_TABLET | ORAL | 0 refills | Status: DC

## 2017-01-11 NOTE — Progress Notes (Signed)
Oncology Nurse Navigator Documentation  Met with Mr. Sara during H&N [place].  He was accompanied by his wife.  Provided verbal and written overview of [place], the clinicians who will be seeing him, encouraged him to ask questions during his time with them.  He was seen by SLP and PT during [place], by Nutrition later in Infusion.  Due to time constraints, to see SW and [place] at a later date.  Spoke with him at end of [facility], addressed questions.  Escorted him to Infusion for chemo tmt.  I encouraged them to call me with needs/concerns.  Gayleen Orem, RN, BSN, [place] at [place] [phone]

## 2017-01-12 ENCOUNTER — Ambulatory Visit
Admission: RE | Admit: 2017-01-12 | Discharge: 2017-01-12 | Disposition: A | Discharge status: Home / Self Care | Payer: 59 | Source: Ambulatory Visit | Attending: Radiation Oncology | Admitting: Radiation Oncology

## 2017-01-12 DIAGNOSIS — Z510 Encounter for antineoplastic radiation therapy: Secondary | ICD-10-CM | POA: Diagnosis not present

## 2017-01-12 MED FILL — FLUCONAZOLE 100 MG TABLET: 100 | 21 days supply | Qty: 22 | Fill #0

## 2017-01-12 NOTE — Progress Notes (Signed)
HEMATOLOGY/ONCOLOGY FOLLOW UP VISIT NOTE  Date of Service: 01/13/2017  Patient Care Team: Claretta Fraise, MD as PCP - General (Family Medicine) Jodi Marble, MD as Consulting Physician (Otolaryngology) Eppie Gibson, MD as Attending Physician (Radiation Oncology) Leota Sauers, RN as Oncology Nurse Navigator (Oncology) Karie Mainland, RD as Dietitian (Nutrition) Jomarie Longs, PT as Physical Therapist (Physical Therapy) Sharen Counter, CCC-SLP as Speech Language Pathologist (Speech Pathology) Kennith Center, LCSW as Social Worker  CHIEF COMPLAINTS/PURPOSE OF CONSULTATION:  Newly diagnosed tonsillar squamous cell carcinoma  HISTORY OF PRESENTING ILLNESS:   Terry Pacheco is a wonderful 66 y.o. male who has been referred to [place] by ENT Specialist, Dr. Erik Obey for evaluation and management of invasive squamous cell carcinoma of tonsil.   He has a PMHx of GERD, HTN, High cholesterol which he takes medications for. He denies PMHx of seizures. He is accompanied by his wife and sister today. He reports that he is doing well overall. The pt initially presented with sudden-onset right lateral tongue numbness that has been ongoing for approximately 1 year. Subsequently, he had left sided gum swelling and intermittent sore throat symptoms. He notes no stroke-like symptoms. He notes that following these symptoms, he was evaluated by multiple providers including an ENT specialist and a dentist prior to meeting with Dr. Erik Obey. Pt wife noted swelling and discoloration inside his mouth prior to the patient being evaluated by Dr. Erik Obey. At his first visit with Dr. Erik Obey, he noted an abnormality to his left tonsil and completed a biopsy that same day.   He has had CT soft tissue neck with contrast on 11/18/2016 with results showing: Fullness of the right palatine tonsil. Surrounding fat planes are ill-defined. This area is obscured by dental artifact. Possible right tonsillar carcinoma.     The patient has had biopsy completed on 11/19/2016 with results of: "RIGHT FAUCIAL TONSIL", BIOPSY: Squamous cell carcinoma, suspicious for superficial invasion in this material. Immunostain p16 positive.   He also had a PET scan completed on 12/02/2016 with results revealing: IMPRESSION: 1. Mildly asymmetric tonsillar activity along the right tonsillar sinus, maximum SUV 7.7 as compared to the left side 5.2, suspicious in this setting for right-sided malignancy. There is also a mildly enlarged and hypermetabolic right level IIa lymph node with maximum SUV of 8.4. No evidence of other metastatic spread. 2. Faint accentuation of activity in the left lateral prostate gland apex, but low-grade. Correlate with PSA level in determining whether further workup is warranted. 3. Other imaging findings of potential clinical significance: Aortic Atherosclerosis (ICD10-I70.0) and Emphysema (ICD10-J43.9). Coronary atherosclerosis. Small type 1 hiatal hernia. Bilateral renal cysts. Colonic diverticulosis. Lastly, he had a MR Face trigeminal on 12/09/2016 with results of IMPRESSION: 1. No evidence of perineural spread of malignancy. 2. Mild asymmetry of the palatine tonsils without discrete mass. Correlate with direct visualization.   As far as surgeries, he has had a polyp on left sided throat that was removed 3-4 years ago that resulted negative for malignancy. He also has had a right hip replacement. Lastly, he has had a left inguinal hernia operation in 1978. He started smoking cigarettes at age 58 and would smoke 1 PPD while working. When he retired several years ago he smoked 0.5 PPD and has quit smoking cigarettes 1 month ago since his diagnosis. He states that he used a vape after quitting but was advised to quit via Dr. Isidore Moos. He occassionally consumes ETOH. 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 Denies allergies to medications at this time. He reports that his  father died of a stroke at age 77 and his mother died from cardiac issues. Mother had gastric cancer and was diagnosed in late 36's. Denies any other cancers of blood disorders in the family.    On review of systems, he denies hematuria, dysuria, or urinary retention. He reports urinary frequency over the past year. He reports bowel incontinence following a bowel movement x 1 year that has gradually improved more recently. He notes that his bowel incontinence has been intermittent. He has well formed stools without blood in stools or rectal bleeding. He denies increase in bowel movements and he has up to 1 bowel movement daily. He has had 2-3 occurrences of hemorrhoids with no recent flares. He denies mucous in his stools. He denies injuries or surgeries to his rectal area. He denies bladder incontinence. He has lower back pain that has been chronic and unchanged. He has a prior hx of diverticulitis approximately 10 years ago that was followed with colonoscopy and was then diagnosed with diverticulosis. He denies headache at this time. He is able to ambulate for prolonged periods without dyspnea on exertion. 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 He has had an audiogram completed by an ENT specialist in [place], [place]:  Cisplatin weeks with concurrent XRT starting 12/30/16 plan to complete   INTERIM HISTORY:   Terry Pacheco presents today for scheduled follow up and cycle 3 cisplatin treatment accompanied by his wife.  He has felt soreness from radiation. He notes mouth sores from chemotherapy. He has used Programmer, systems soda/salt mixture for mouth sores. He is willing to try clarified butter or olive oil to moisturize his mouth. He has a oral thrush in his mouth which he is taking Fluconazole for. He has yet to ask for pain medication. He is concerned about getting addicted. He is willing to try pain medication if  needed along with his Trazodone that he has not been taking. Platelets slightly low at 125K and other blood counts and chemistries are adequate.  He reports the ringing in his ears has not changed. His wife notes he cannot hear him. His wife also notes he has gotten more "edgy" and irritable lately. He had 1-2 headaches yesterday. He has been sleeping less lately which is connected to current congestion in his nose. He has cut down on by mouth eating due to his thrush but will increase his tube feeding. T No fevers/chills. No change in baseline hearing or tinnitus.   MEDICAL HISTORY:  Past Medical History:  Diagnosis Date  . Arthritis   . GERD (gastroesophageal reflux disease) 07/06/2014  . Hypertension   . Sleep apnea    does not use CPAP  . Wears glasses     SURGICAL HISTORY: Past Surgical History:  Procedure Laterality Date  . HERNIA REPAIR    . IR FLUORO GUIDE PORT INSERTION RIGHT  12/25/2016  . IR GASTROSTOMY TUBE MOD SED  12/25/2016  . IR US GUIDE VASC ACCESS RIGHT  12/25/2016  . polyp removal  2008   during colonoscopy  . RIGHT TOTAL HIP ARTHROPLASTY ANTERIOR APPROACH Right 10/16/2015   Performed by Paralee Cancel, MD at [facility] ORS  . vocal cord polyp      removed 2-3 years ago     SOCIAL HISTORY: Social History  Socioeconomic History  . Marital status: Married    Spouse name: Not on file  . Number of children: 1  . Years of education: Not on file  . Highest education level: Not on file  Social Needs  . Financial resource strain: Not on file  . Food insecurity - worry: Not on file  . Food insecurity - inability: Not on file  . Transportation needs - medical: Not on file  . Transportation needs - non-medical: Not on file  Occupational History  . Occupation: Retired     Fish farm manager: [employer]  Tobacco Use  . Smoking status: Former Smoker    Packs/day: 0.50    Years: 45.00    Pack years: 22.50    Types: Cigarettes    Start date: 01/12/1983    Last attempt to  quit: 11/17/2016    Years since quitting: 0.1  . Smokeless tobacco: Never Used  Substance and Sexual Activity  . Alcohol use: Yes    Alcohol/week: 0.0 oz    Comment: rare  . Drug use: Yes    Types: Marijuana    Comment: last use 10/09/2015  . Sexual activity: Not on file  Other Topics Concern  . Not on file  Social History Narrative  . Not on file    FAMILY HISTORY: Family History  Problem Relation Age of Onset  . Heart disease Mother   . Stroke Father     ALLERGIES:  is allergic to crestor [rosuvastatin].  MEDICATIONS:  Current Outpatient Medications  Medication Sig Dispense Refill  . escitalopram (LEXAPRO) 20 MG tablet TAKE 0.5 TABLETS (10 MG TOTAL) BY MOUTH DAILY. 45 tablet 0  . fluconazole (DIFLUCAN) 100 MG tablet Take 2 tablets today, then 1 tablet daily x 20 more days for yeast infection. Hold Atorvastatin while on this. 22 tablet 0  . lidocaine (XYLOCAINE) 2 % solution Mix 1 part 2%viscous lidocaine,1part H2O.Swish and swallow 31mL of this mixture, 33min before meals and at bedtime, up to QID 100 mL 5  . Multiple Vitamin (MULTIVITAMIN) tablet Take 1 tablet by mouth daily.    Marland Kitchen omeprazole (PRILOSEC) 20 MG capsule TAKE 1 CAPSULE BY MOUTH EVERY DAY 90 capsule 0  . sodium fluoride (FLUORISHIELD) 1.1 % GEL dental gel Instill one drop of gel into each tooth space of fluoride tray. Place over teeth for 5 minutes. Remove. Spit out excess. Repeat nightly 120 mL prn  . TURMERIC PO Take 1,200 mg by mouth.    Marland Kitchen amLODipine (NORVASC) 10 MG tablet TAKE 1 TABLET EVERY DAY FOR FOR BLOOD PRESSURE 90 tablet 0  . atorvastatin (LIPITOR) 40 MG tablet TAKE 1 TABLET BY MOUTH EVERY DAY 90 tablet 0  . dexamethasone (DECADRON) 4 MG tablet Take 2 tablets by mouth once a day on the day after chemotherapy and then take 2 tablets two times a day for 2 days. Take with food. 30 tablet 0  . lidocaine-prilocaine (EMLA) cream Apply 1 application as needed topically. 30 g 5  . ondansetron (ZOFRAN) 8 MG  tablet Take 1 tablet (8 mg total) 2 (two) times daily as needed by mouth. Start on the third day after chemotherapy. 30 tablet 0  . oxyCODONE (OXY IR/ROXICODONE) 5 MG immediate release tablet Take 1 tablet (5 mg total) every 4 (four) hours as needed by mouth for severe pain. 30 tablet 0  . prochlorperazine (COMPAZINE) 10 MG tablet Take 1 tablet (10 mg total) by mouth every 6 (six) hours as needed (Nausea or vomiting). (Patient not  taking: Reported on 12/30/2016) 30 tablet 1  . sucralfate (CARAFATE) 1 GM/10ML suspension Take 10 mLs (1 g total) 4 (four) times daily -  with meals and at bedtime by mouth. 420 mL 1  . traZODone (DESYREL) 50 MG tablet Take 1 tablet (50 mg total) by mouth at bedtime as needed for sleep. (Patient not taking: Reported on 12/16/2016) 30 tablet 1   No current facility-administered medications for this visit.     REVIEW OF SYSTEMS:    A 10+ POINT REVIEW OF SYSTEMS WAS OBTAINED including neurology, dermatology, psychiatry, cardiac, respiratory, lymph, extremities, GI, GU, Musculoskeletal, constitutional, breasts, reproductive, HEENT.  All pertinent positives are noted in the HPI.  All others are negative.  PHYSICAL EXAMINATION: ECOG PERFORMANCE STATUS: 1 - Symptomatic but completely ambulatory  . Vitals:   01/13/17 0846  BP: 112/86  Pulse: 83  Resp: 17  Temp: 98.6 F (37 C)  SpO2: 97%   Filed Weights   01/13/17 0846  Weight: 209 lb 1.6 oz (94.8 kg)   .Body mass index is 30.88 kg/m.  GENERAL:alert, in no acute distress and comfortable SKIN: no acute rashes, no significant lesions EYES: conjunctiva are pink and non-injected, sclera anicteric OROPHARYNX: Right tonsillar mass that is close to the base of the tongue and extending into the right side of the roof of his mouth. MMM, no exudates, no oropharyngeal erythema or ulceration (+) oral Thrush, mucositis   NECK: supple, no JVD. Small 1 cm lymph node in right upper neck.  LYMPH:  no palpable lymphadenopathy in  the cervical, axillary or inguinal regions LUNGS: clear to auscultation b/l with normal respiratory effort HEART: 2/6 systolic murmur. Regular rate & rhythm ABDOMEN:  normoactive bowel sounds , non tender, not distended. Extremity: no pedal edema PSYCH: alert & oriented x 3 with fluent speech NEURO: no focal motor/sensory deficits    LABORATORY DATA:  I have reviewed the data as listed  . CBC Latest Ref Rng & Units 01/20/2017 01/13/2017 01/06/2017  WBC 4.0 - 10.3 10e3/uL 3.5(L) 8.0 9.3  Hemoglobin 13.0 - 17.1 g/dL 13.4 14.7 14.8  Hematocrit 38.4 - 49.9 % 39.2 43.7 42.5  Platelets 140 - 400 10e3/uL 58(L) 125(L) 170    . CMP Latest Ref Rng & Units 01/20/2017 01/13/2017 01/06/2017  Glucose 70 - 140 mg/dl 94 75 99  BUN 7.0 - 26.0 mg/dL 28.0(H) 16.7 14.9  Creatinine 0.7 - 1.3 mg/dL 1.0 1.1 1.1  Sodium 136 - 145 mEq/L 130(L) 135(L) 132(L)  Potassium 3.5 - 5.1 mEq/L 4.4 4.8 4.0  Chloride 96 - 106 mmol/L - - -  CO2 22 - 29 mEq/L 26 27 25   Calcium 8.4 - 10.4 mg/dL 8.8 9.0 8.7  Total Protein 6.4 - 8.3 g/dL 5.9(L) 6.4 6.4  Total Bilirubin 0.20 - 1.20 mg/dL 0.67 0.85 0.73  Alkaline Phos 40 - 150 U/L 83 98 101  AST 5 - 34 U/L 23 19 24   ALT 0 - 55 U/L 73(H) 49 47    PATHOLOGY:    RADIOGRAPHIC STUDIES: I have personally reviewed the radiological images as listed and agreed with the findings in the report. Ir Gastrostomy Tube Mod Sed  Result Date: 12/25/2016 INDICATION: 66 year old with squamous cell carcinoma in the right tonsillar fossa. Port-A-Cath was placed immediately prior to placement of the gastrostomy tube. EXAM: PERCUTANEOUS GASTROSTOMY TUBE WITH FLUOROSCOPIC GUIDANCE Physician: Stephan Minister. Henn, MD MEDICATIONS: Ancef 2 g was started prior to placement of the Port-A-Cath. ANESTHESIA/SEDATION: Versed 1.5 mg IV; Fentanyl 125 mcg IV  Moderate Sedation Time:  13 minutes The patient was continuously monitored during the procedure by the interventional radiology nurse under my direct  supervision. FLUOROSCOPY TIME:  Fluoroscopy Time: 3 minutes 6 seconds (47 mGy). COMPLICATIONS: None immediate. PROCEDURE: Informed consent was obtained for a percutaneous gastrostomy tube. The patient was placed on the interventional table. Fluoroscopy demonstrated oral contrast in the transverse colon. An orogastric tube was placed with fluoroscopic guidance. The anterior abdomen was prepped and draped in sterile fashion. Maximal barrier sterile technique was utilized including caps, mask, sterile gowns, sterile gloves, sterile drape, hand hygiene and skin antiseptic. Stomach was inflated with air through the orogastric tube. The skin and subcutaneous tissues were anesthetized with 1% lidocaine. A 17 gauge needle was directed into the distended stomach with fluoroscopic guidance. A wire was advanced into the stomach and a T-tact was deployed. A 9-French vascular sheath was placed and the orogastric tube was snared using a [place] snare device. The orogastric tube and snare were pulled out of the patient's mouth. The snare device was connected to a 20-French gastrostomy tube. The snare device and gastrostomy tube were pulled through the patient's mouth and out the anterior abdominal wall. The gastrostomy tube was cut to an appropriate length. Contrast injection through gastrostomy tube confirmed placement within the stomach. Fluoroscopic images were obtained for documentation. The gastrostomy tube was flushed with normal saline. IMPRESSION: Successful fluoroscopic guided percutaneous gastrostomy tube placement. Electronically Signed   By: Markus Daft M.D.   On: 12/25/2016 16:59   Ir US Guide Vasc Access Right  Result Date: 12/25/2016 INDICATION: 66 year old with squamous cell carcinoma at the right tonsillar fossa. Patient is scheduled for Port-A-Cath placement and gastrostomy tube placement. EXAM: FLUOROSCOPIC AND ULTRASOUND GUIDED PLACEMENT OF A SUBCUTANEOUS PORT COMPARISON:  None. MEDICATIONS: Ancef 2 g; The  antibiotic was administered within an appropriate time interval prior to skin puncture. ANESTHESIA/SEDATION: Versed 2.5 mg IV; Fentanyl 125 mcg IV; Moderate Sedation Time:  30 minutes The patient was continuously monitored during the procedure by the interventional radiology nurse under my direct supervision. FLUOROSCOPY TIME:  42 seconds, 7 mGy COMPLICATIONS: None immediate. PROCEDURE: The procedure, risks, benefits, and alternatives were explained to the patient. Questions regarding the procedure were encouraged and answered. The patient understands and consents to the procedure. Patient was placed supine on the interventional table. Ultrasound confirmed a patent right internal jugular vein. The right chest and neck were cleaned with a skin antiseptic and a sterile drape was placed. Maximal barrier sterile technique was utilized including caps, mask, sterile gowns, sterile gloves, sterile drape, hand hygiene and skin antiseptic. The right neck was anesthetized with 1% lidocaine. Small incision was made in the right neck with a blade. Micropuncture set was placed in the right internal jugular vein with ultrasound guidance. The micropuncture wire was used for measurement purposes. The right chest was anesthetized with 1% lidocaine with epinephrine. #15 blade was used to make an incision and a subcutaneous port pocket was formed. [place] was assembled. Subcutaneous tunnel was formed with a stiff tunneling device. The port catheter was brought through the subcutaneous tunnel. The port was placed in the subcutaneous pocket. The micropuncture set was exchanged for a peel-away sheath. The catheter was placed through the peel-away sheath and the tip was positioned at the superior cavoatrial junction. Catheter placement was confirmed with fluoroscopy. The port was accessed and flushed with heparinized saline. The port pocket was closed using two layers of absorbable sutures and Dermabond. The vein skin  site was  closed using a single layer of absorbable suture and Dermabond. Sterile dressings were applied. Patient tolerated the procedure well without an immediate complication. Ultrasound and fluoroscopic images were taken and saved for this procedure. IMPRESSION: Placement of a subcutaneous port device. Electronically Signed   By: Markus Daft M.D.   On: 12/25/2016 15:37   Ir Fluoro Guide Port Insertion Right  Result Date: 12/25/2016 INDICATION: 66 year old with squamous cell carcinoma at the right tonsillar fossa. Patient is scheduled for Port-A-Cath placement and gastrostomy tube placement. EXAM: FLUOROSCOPIC AND ULTRASOUND GUIDED PLACEMENT OF A SUBCUTANEOUS PORT COMPARISON:  None. MEDICATIONS: Ancef 2 g; The antibiotic was administered within an appropriate time interval prior to skin puncture. ANESTHESIA/SEDATION: Versed 2.5 mg IV; Fentanyl 125 mcg IV; Moderate Sedation Time:  30 minutes The patient was continuously monitored during the procedure by the interventional radiology nurse under my direct supervision. FLUOROSCOPY TIME:  42 seconds, 7 mGy COMPLICATIONS: None immediate. PROCEDURE: The procedure, risks, benefits, and alternatives were explained to the patient. Questions regarding the procedure were encouraged and answered. The patient understands and consents to the procedure. Patient was placed supine on the interventional table. Ultrasound confirmed a patent right internal jugular vein. The right chest and neck were cleaned with a skin antiseptic and a sterile drape was placed. Maximal barrier sterile technique was utilized including caps, mask, sterile gowns, sterile gloves, sterile drape, hand hygiene and skin antiseptic. The right neck was anesthetized with 1% lidocaine. Small incision was made in the right neck with a blade. Micropuncture set was placed in the right internal jugular vein with ultrasound guidance. The micropuncture wire was used for measurement purposes. The right chest was anesthetized  with 1% lidocaine with epinephrine. #15 blade was used to make an incision and a subcutaneous port pocket was formed. [place] was assembled. Subcutaneous tunnel was formed with a stiff tunneling device. The port catheter was brought through the subcutaneous tunnel. The port was placed in the subcutaneous pocket. The micropuncture set was exchanged for a peel-away sheath. The catheter was placed through the peel-away sheath and the tip was positioned at the superior cavoatrial junction. Catheter placement was confirmed with fluoroscopy. The port was accessed and flushed with heparinized saline. The port pocket was closed using two layers of absorbable sutures and Dermabond. The vein skin site was closed using a single layer of absorbable suture and Dermabond. Sterile dressings were applied. Patient tolerated the procedure well without an immediate complication. Ultrasound and fluoroscopic images were taken and saved for this procedure. IMPRESSION: Placement of a subcutaneous port device. Electronically Signed   By: Markus Daft M.D.   On: 12/25/2016 15:37    ASSESSMENT & PLAN:   66 y.o. male presenting with:   1) Newly diagnosed rt tonsillar Squamous cell carcinoma with cTx cN1 disease patient case was discussed in tumor board and given concern for nerve involvement was thought not to be a good candidate for primary surgery due to concerns for significant post-Sx morbidity. -creatinine WNL. Some concern for decreased hearing and baseline tinnitus due to previous job noise related injury. Baseline audiogram showed normal symmetric low-frequency hearing, dropping off substantially above 2000Hz .   Plan -Started concurrent chemotherapy (Cisplatin weekly) with concurrent RT on 12/30/16.   -Developed mucositis and oral thrush from chemoradiation. He is currently using Magic Mouth wash and Fluconazole to treat. I also suggested clarified butter or olive oil to moisturize his mouth. I prescribed him  sucralfate as well.  -I explained he  is fine to use pain medication. I prescribed Oxycodone as needed for pain from his mucositis -prn Tazodone for sleep -I suggest keeping his feeding tube dry, he can use topical Vitamin A and D.  -Labs are stable and will proceed with cycle 3 cisplatin today  -Will continue to get fluids and appropriate electrolyte replacement prior to cisplatin administration and fluids and electrolytes following cisplatin administration -will continue kidney and electrolytes levels with cisplatin therapy -Discussed previously with patient, pt wife regarding importance of maintaining nutrition during chemotherapy and radiotherapy treatments. They have follow up with Ernestene Kiel in the near future about his nutritional status.  -will need rpt audiogram after 3-4 weekly doses to monitor changes with Cisplatin. -I again informed him of possibly experiencing diarrhea, constipation, and nausea throughout his treatments. I informed him that he can begin anti-diarrhea, laxatives, or prn anti-emetics if any of these will become an issue.     Continue weekly Cisplatin as per orders RTC with Dr Irene Limbo in 2 week with C5D1  Weekly Labs      All of the patients questions were answered with apparent satisfaction. The patient knows to call the clinic with any problems, questions or concerns.  I spent 20 minutes counseling the patient face to face. The total time spent in the appointment was 30 minutes and more than 50% was on counseling and direct patient cares.   .I have reviewed the above documentation for accuracy and completeness, and I agree with the above.  Sullivan Lone MD Jones AAHIVMS [facility] Hematology/Oncology Physician [facility]  (Office):       [phone] (Work cell):  [phone] (Fax):           [phone]  01/13/2017 9:38 AM  This document serves as a record of services personally performed by Sullivan Lone, MD. It was created on his behalf by  Joslyn Devon, a trained medical scribe. The creation of this record is based on the scribe's personal observations and the provider's statements to them.   .I have reviewed the above documentation for accuracy and completeness, and I agree with the above. Brunetta Genera MD MS

## 2017-01-13 ENCOUNTER — Ambulatory Visit (HOSPITAL_BASED_OUTPATIENT_CLINIC_OR_DEPARTMENT_OTHER): Payer: 59

## 2017-01-13 ENCOUNTER — Other Ambulatory Visit (HOSPITAL_COMMUNITY)
Admission: RE | Admit: 2017-01-13 | Discharge: 2017-01-13 | Disposition: A | Discharge status: Home / Self Care | Payer: 59 | Source: Ambulatory Visit | Attending: Hematology and Oncology | Admitting: Hematology and Oncology

## 2017-01-13 ENCOUNTER — Ambulatory Visit: Payer: 59 | Admitting: Nutrition

## 2017-01-13 ENCOUNTER — Other Ambulatory Visit: Payer: Self-pay

## 2017-01-13 ENCOUNTER — Encounter: Payer: Self-pay | Admitting: Hematology

## 2017-01-13 ENCOUNTER — Other Ambulatory Visit (HOSPITAL_BASED_OUTPATIENT_CLINIC_OR_DEPARTMENT_OTHER): Payer: 59

## 2017-01-13 ENCOUNTER — Ambulatory Visit
Admission: RE | Admit: 2017-01-13 | Discharge: 2017-01-13 | Disposition: A | Discharge status: Home / Self Care | Payer: 59 | Source: Ambulatory Visit | Attending: Radiation Oncology | Admitting: Radiation Oncology

## 2017-01-13 ENCOUNTER — Ambulatory Visit (HOSPITAL_BASED_OUTPATIENT_CLINIC_OR_DEPARTMENT_OTHER): Payer: 59 | Admitting: Hematology

## 2017-01-13 VITALS — BP 112/86 | HR 83 | Temp 98.6°F | Resp 17 | Ht 69.0 in | Wt 209.1 lb

## 2017-01-13 DIAGNOSIS — C090 Malignant neoplasm of tonsillar fossa: Secondary | ICD-10-CM

## 2017-01-13 DIAGNOSIS — C099 Malignant neoplasm of tonsil, unspecified: Secondary | ICD-10-CM

## 2017-01-13 DIAGNOSIS — Z510 Encounter for antineoplastic radiation therapy: Secondary | ICD-10-CM | POA: Diagnosis not present

## 2017-01-13 DIAGNOSIS — Z5111 Encounter for antineoplastic chemotherapy: Secondary | ICD-10-CM

## 2017-01-13 DIAGNOSIS — Z7189 Other specified counseling: Secondary | ICD-10-CM

## 2017-01-13 DIAGNOSIS — Z95828 Presence of other vascular implants and grafts: Secondary | ICD-10-CM

## 2017-01-13 LAB — COMPREHENSIVE METABOLIC PANEL
ALBUMIN: 3.4 g/dL — AB (ref 3.5–5.0)
ALK PHOS: 98 U/L (ref 40–150)
ALT: 49 U/L (ref 0–55)
ANION GAP: 8 meq/L (ref 3–11)
AST: 19 U/L (ref 5–34)
BILIRUBIN TOTAL: 0.85 mg/dL (ref 0.20–1.20)
BUN: 16.7 mg/dL (ref 7.0–26.0)
CO2: 27 meq/L (ref 22–29)
CREATININE: 1.1 mg/dL (ref 0.7–1.3)
Calcium: 9 mg/dL (ref 8.4–10.4)
Chloride: 100 mEq/L (ref 98–109)
Glucose: 75 mg/dl (ref 70–140)
Potassium: 4.8 mEq/L (ref 3.5–5.1)
Sodium: 135 mEq/L — ABNORMAL LOW (ref 136–145)
TOTAL PROTEIN: 6.4 g/dL (ref 6.4–8.3)

## 2017-01-13 LAB — CBC WITH DIFFERENTIAL/PLATELET
BASO%: 0 % (ref 0.0–2.0)
Basophils Absolute: 0 10*3/uL (ref 0.0–0.1)
EOS%: 0.8 % (ref 0.0–7.0)
Eosinophils Absolute: 0.1 10*3/uL (ref 0.0–0.5)
HCT: 43.7 % (ref 38.4–49.9)
HGB: 14.7 g/dL (ref 13.0–17.1)
LYMPH%: 8.4 % — AB (ref 14.0–49.0)
MCH: 30.6 pg (ref 27.2–33.4)
MCHC: 33.6 g/dL (ref 32.0–36.0)
MCV: 90.9 fL (ref 79.3–98.0)
MONO#: 0.5 10*3/uL (ref 0.1–0.9)
MONO%: 6.5 % (ref 0.0–14.0)
NEUT%: 84.3 % — AB (ref 39.0–75.0)
NEUTROS ABS: 6.7 10*3/uL — AB (ref 1.5–6.5)
Platelets: 125 10*3/uL — ABNORMAL LOW (ref 140–400)
RBC: 4.81 10*6/uL (ref 4.20–5.82)
RDW: 12.9 % (ref 11.0–14.6)
WBC: 8 10*3/uL (ref 4.0–10.3)
lymph#: 0.7 10*3/uL — ABNORMAL LOW (ref 0.9–3.3)

## 2017-01-13 LAB — MAGNESIUM: MAGNESIUM: 2.1 mg/dL (ref 1.5–2.5)

## 2017-01-13 LAB — PHOSPHORUS: Phosphorus: 4 mg/dL (ref 2.5–4.6)

## 2017-01-13 MED ORDER — SODIUM CHLORIDE 0.9 % IV SOLN
40.0000 mg/m2 | Freq: Once | INTRAVENOUS | Status: AC
  Administered 2017-01-13: 86 mg via INTRAVENOUS
  Filled 2017-01-13: qty 86

## 2017-01-13 MED ORDER — POTASSIUM CHLORIDE 2 MEQ/ML IV SOLN
Freq: Once | INTRAVENOUS | Status: AC
  Administered 2017-01-13: 11:00:00 via INTRAVENOUS
  Filled 2017-01-13: qty 10

## 2017-01-13 MED ORDER — SODIUM CHLORIDE 0.9 % IV SOLN
Freq: Once | INTRAVENOUS | Status: AC
  Administered 2017-01-13 (×2): via INTRAVENOUS

## 2017-01-13 MED ORDER — SUCRALFATE 1 GM/10ML PO SUSP: 1.0000 g | Freq: Three times a day (TID) | ORAL | 1 refills | Status: DC

## 2017-01-13 MED ORDER — HEPARIN SOD (PORK) LOCK FLUSH 100 UNIT/ML IV SOLN
500.0000 [IU] | Freq: Once | INTRAVENOUS | Status: DC | PRN
  Filled 2017-01-13: qty 5

## 2017-01-13 MED ORDER — PALONOSETRON HCL INJECTION 0.25 MG/5ML
INTRAVENOUS | Status: AC
  Filled 2017-01-13: qty 5

## 2017-01-13 MED ORDER — DEXAMETHASONE 4 MG PO TABS: ORAL_TABLET | ORAL | 0 refills | Status: DC

## 2017-01-13 MED ORDER — OXYCODONE HCL 5 MG PO TABS: 5.0000 mg | ORAL_TABLET | ORAL | 0 refills | Status: DC | PRN

## 2017-01-13 MED ORDER — PALONOSETRON HCL INJECTION 0.25 MG/5ML
0.2500 mg | Freq: Once | INTRAVENOUS | Status: AC
  Administered 2017-01-13: 0.25 mg via INTRAVENOUS

## 2017-01-13 MED ORDER — SODIUM CHLORIDE 0.9% FLUSH
10.0000 mL | INTRAVENOUS | Status: DC | PRN
  Filled 2017-01-13: qty 10

## 2017-01-13 MED ORDER — ONDANSETRON HCL 8 MG PO TABS: 8.0000 mg | ORAL_TABLET | Freq: Two times a day (BID) | ORAL | 0 refills | Status: DC | PRN

## 2017-01-13 MED ORDER — LIDOCAINE-PRILOCAINE 2.5-2.5 % EX CREA: 1.0000 "application " | TOPICAL_CREAM | CUTANEOUS | 5 refills | Status: DC | PRN

## 2017-01-13 MED ORDER — FOSAPREPITANT DIMEGLUMINE INJECTION 150 MG
Freq: Once | INTRAVENOUS | Status: AC
  Administered 2017-01-13: 13:00:00 via INTRAVENOUS
  Filled 2017-01-13: qty 5

## 2017-01-13 MED FILL — ONDANSETRON HCL 8 MG TAB: 8 | 15 days supply | Qty: 30 | Fill #0

## 2017-01-13 MED FILL — oxyCODONE HCL 5 MG TABS: 5 | 5 days supply | Qty: 30 | Fill #0

## 2017-01-13 MED FILL — DEXAMETHASONE 4 MG TABLET: 4 | 10 days supply | Qty: 30 | Fill #0

## 2017-01-13 MED FILL — LIDOCAINE-PRILOCAINE CREAM: 2.5-2.5 | 10 days supply | Qty: 30 | Fill #0

## 2017-01-13 MED FILL — CARAFATE 1 GM/10 ML SUSP: 1 | 10 days supply | Qty: 420 | Fill #0

## 2017-01-13 MED FILL — LIDOCAINE 2% VISCOUS SOLN: 2 | 3 days supply | Qty: 100 | Fill #1

## 2017-01-13 NOTE — Progress Notes (Signed)
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 Current weight documented as 209.1 pounds November 20, down slightly from 211.5 pounds November 13. Patient reports he is flushing his feeding tube with water without difficulty. He continues to eat soft foods and he is drinking 2 Carnation breakfast essentials daily. Reports his mouth is beginning to get sore.  He has started using lidocaine. No other nutrition impact symptoms.  Estimated nutrition needs: 2200-2400 calories, 115-125 grams protein, 2.4 L fluid.  Nutrition diagnosis: Predicted suboptimal energy intake continues.  Intervention: Recommended patient increase Carnation breakfast essentials 4 times a day. Recommended patient use fortified milk recipe so that each Carnation breakfast will provide 350 cal and 20 g protein. Reviewed strategies for altering textures of soft foods. Questions were answered.  Teach back method used.  Monitoring, evaluation, goals: Patient will increase calories and protein to minimize weight loss.  Next visit: Tuesday, November 27, during infusion.  **Disclaimer: This note was dictated with voice recognition software. Similar sounding words can inadvertently be transcribed and this note may contain transcription errors which may not have been corrected upon publication of note.**

## 2017-01-13 NOTE — Patient Instructions (Signed)
[facility] Discharge Instructions for Patients Receiving Chemotherapy  Today you received the following chemotherapy agents Cisplatin  To help prevent nausea and vomiting after your treatment, we encourage you to take your nausea medication as directed  If you develop nausea and vomiting that is not controlled by your nausea medication, call the clinic.   BELOW ARE SYMPTOMS THAT SHOULD BE REPORTED IMMEDIATELY:  *FEVER GREATER THAN 100.5 F  *CHILLS WITH OR WITHOUT FEVER  NAUSEA AND VOMITING THAT IS NOT CONTROLLED WITH YOUR NAUSEA MEDICATION  *UNUSUAL SHORTNESS OF BREATH  *UNUSUAL BRUISING OR BLEEDING  TENDERNESS IN MOUTH AND THROAT WITH OR WITHOUT PRESENCE OF ULCERS  *URINARY PROBLEMS  *BOWEL PROBLEMS  UNUSUAL RASH Items with * indicate a potential emergency and should be followed up as soon as possible.  Feel free to call the clinic should you have any questions or concerns. The clinic phone number is [phone].  Please show the CHEMO ALERT CARD at check-in to the Emergency Department and triage nurse.   

## 2017-01-13 NOTE — Progress Notes (Signed)
1110: Port meets resistance when flushed and no blood return noted. Port deaccessed.  [place], blood return noted and drips to gravy.  1122: Port meets resistance when flushing and no blood return noted.  Dr. Irene Limbo aware, okay to proceed with treatment through a PIV and pt to be set up for a dye study in IR. Pt aware and verbalizes understanding and agrees with plan.

## 2017-01-14 ENCOUNTER — Ambulatory Visit
Admission: RE | Admit: 2017-01-14 | Discharge: 2017-01-14 | Disposition: A | Discharge status: Home / Self Care | Payer: 59 | Source: Ambulatory Visit | Attending: Radiation Oncology | Admitting: Radiation Oncology

## 2017-01-14 ENCOUNTER — Encounter (HOSPITAL_COMMUNITY): Payer: Self-pay | Admitting: Interventional Radiology

## 2017-01-14 ENCOUNTER — Ambulatory Visit (HOSPITAL_COMMUNITY)
Admission: RE | Admit: 2017-01-14 | Discharge: 2017-01-14 | Disposition: A | Discharge status: Home / Self Care | Payer: 59 | Source: Ambulatory Visit | Attending: Hematology | Admitting: Hematology

## 2017-01-14 DIAGNOSIS — C099 Malignant neoplasm of tonsil, unspecified: Secondary | ICD-10-CM | POA: Insufficient documentation

## 2017-01-14 DIAGNOSIS — Z452 Encounter for adjustment and management of vascular access device: Secondary | ICD-10-CM | POA: Insufficient documentation

## 2017-01-14 DIAGNOSIS — Z510 Encounter for antineoplastic radiation therapy: Secondary | ICD-10-CM | POA: Diagnosis not present

## 2017-01-14 DIAGNOSIS — Z95828 Presence of other vascular implants and grafts: Secondary | ICD-10-CM

## 2017-01-14 HISTORY — PX: IR CV LINE INJECTION: IMG2294

## 2017-01-14 MED ORDER — HEPARIN SOD (PORK) LOCK FLUSH 100 UNIT/ML IV SOLN
INTRAVENOUS | Status: AC
  Filled 2017-01-14: qty 5

## 2017-01-14 MED ORDER — HEPARIN SOD (PORK) LOCK FLUSH 100 UNIT/ML IV SOLN
INTRAVENOUS | Status: AC | PRN
  Administered 2017-01-14: 500 [IU]

## 2017-01-14 MED ORDER — IOPAMIDOL (ISOVUE-300) INJECTION 61%
INTRAVENOUS | Status: AC
  Administered 2017-01-14: 10 mL via INTRAVENOUS
  Filled 2017-01-14: qty 50

## 2017-01-14 MED ORDER — IOPAMIDOL (ISOVUE-300) INJECTION 61%
10.0000 mL | Freq: Once | INTRAVENOUS | Status: AC | PRN
  Administered 2017-01-14: 10 mL via INTRAVENOUS

## 2017-01-14 NOTE — Procedures (Signed)
Pre Procedure Dx: Poor venous access Post Procedural Dx: Same  Appropriately positioned and functioning port-a-catheter. Port is ready for immediate use.  Estimated Blood Loss: None Complications: None immediate.  Ronny Bacon, MD Pager #: [phone]

## 2017-01-16 ENCOUNTER — Other Ambulatory Visit: Payer: Self-pay | Admitting: Family Medicine

## 2017-01-18 ENCOUNTER — Encounter: Payer: Self-pay | Admitting: Family Medicine

## 2017-01-19 ENCOUNTER — Telehealth: Payer: Self-pay | Admitting: Internal Medicine

## 2017-01-19 ENCOUNTER — Other Ambulatory Visit: Payer: Self-pay | Admitting: *Deleted

## 2017-01-19 ENCOUNTER — Ambulatory Visit
Admission: RE | Admit: 2017-01-19 | Discharge: 2017-01-19 | Disposition: A | Discharge status: Home / Self Care | Payer: 59 | Source: Ambulatory Visit | Attending: Radiation Oncology | Admitting: Radiation Oncology

## 2017-01-19 DIAGNOSIS — Z510 Encounter for antineoplastic radiation therapy: Secondary | ICD-10-CM | POA: Diagnosis not present

## 2017-01-19 DIAGNOSIS — C090 Malignant neoplasm of tonsillar fossa: Secondary | ICD-10-CM

## 2017-01-19 MED FILL — LIDOCAINE 2% VISCOUS SOLN: 2 | 3 days supply | Qty: 100 | Fill #2

## 2017-01-19 NOTE — Progress Notes (Signed)

## 2017-01-19 NOTE — Telephone Encounter (Signed)
Left message re 11/27 appointments. Patient to get updated schedule 11/27 or confirm appointments via mychart.

## 2017-01-20 ENCOUNTER — Encounter: Payer: 59 | Admitting: Nutrition

## 2017-01-20 ENCOUNTER — Ambulatory Visit
Admission: RE | Admit: 2017-01-20 | Discharge: 2017-01-20 | Disposition: A | Discharge status: Home / Self Care | Payer: 59 | Source: Ambulatory Visit | Attending: Radiation Oncology | Admitting: Radiation Oncology

## 2017-01-20 ENCOUNTER — Ambulatory Visit (HOSPITAL_BASED_OUTPATIENT_CLINIC_OR_DEPARTMENT_OTHER): Payer: 59

## 2017-01-20 ENCOUNTER — Other Ambulatory Visit (HOSPITAL_BASED_OUTPATIENT_CLINIC_OR_DEPARTMENT_OTHER): Payer: 59

## 2017-01-20 VITALS — BP 115/63 | HR 71 | Temp 98.7°F | Resp 18

## 2017-01-20 DIAGNOSIS — Z510 Encounter for antineoplastic radiation therapy: Secondary | ICD-10-CM | POA: Diagnosis not present

## 2017-01-20 DIAGNOSIS — Z7189 Other specified counseling: Secondary | ICD-10-CM

## 2017-01-20 DIAGNOSIS — C090 Malignant neoplasm of tonsillar fossa: Secondary | ICD-10-CM

## 2017-01-20 DIAGNOSIS — E871 Hypo-osmolality and hyponatremia: Secondary | ICD-10-CM

## 2017-01-20 LAB — COMPREHENSIVE METABOLIC PANEL
ALBUMIN: 3.1 g/dL — AB (ref 3.5–5.0)
ALK PHOS: 83 U/L (ref 40–150)
ALT: 73 U/L — ABNORMAL HIGH (ref 0–55)
ANION GAP: 7 meq/L (ref 3–11)
AST: 23 U/L (ref 5–34)
BILIRUBIN TOTAL: 0.67 mg/dL (ref 0.20–1.20)
BUN: 28 mg/dL — ABNORMAL HIGH (ref 7.0–26.0)
CO2: 26 meq/L (ref 22–29)
Calcium: 8.8 mg/dL (ref 8.4–10.4)
Chloride: 97 mEq/L — ABNORMAL LOW (ref 98–109)
Creatinine: 1 mg/dL (ref 0.7–1.3)
GLUCOSE: 94 mg/dL (ref 70–140)
POTASSIUM: 4.4 meq/L (ref 3.5–5.1)
SODIUM: 130 meq/L — AB (ref 136–145)
TOTAL PROTEIN: 5.9 g/dL — AB (ref 6.4–8.3)

## 2017-01-20 LAB — CBC WITH DIFFERENTIAL/PLATELET
BASO%: 0 % (ref 0.0–2.0)
Basophils Absolute: 0 10*3/uL (ref 0.0–0.1)
EOS%: 0.6 % (ref 0.0–7.0)
Eosinophils Absolute: 0 10*3/uL (ref 0.0–0.5)
HCT: 39.2 % (ref 38.4–49.9)
HEMOGLOBIN: 13.4 g/dL (ref 13.0–17.1)
LYMPH#: 0.4 10*3/uL — AB (ref 0.9–3.3)
LYMPH%: 10.7 % — ABNORMAL LOW (ref 14.0–49.0)
MCH: 30.8 pg (ref 27.2–33.4)
MCHC: 34.2 g/dL (ref 32.0–36.0)
MCV: 90.1 fL (ref 79.3–98.0)
MONO#: 0.2 10*3/uL (ref 0.1–0.9)
MONO%: 7 % (ref 0.0–14.0)
NEUT%: 81.7 % — ABNORMAL HIGH (ref 39.0–75.0)
NEUTROS ABS: 2.8 10*3/uL (ref 1.5–6.5)
PLATELETS: 58 10*3/uL — AB (ref 140–400)
RBC: 4.35 10*6/uL (ref 4.20–5.82)
RDW: 13 % (ref 11.0–14.6)
WBC: 3.5 10*3/uL — AB (ref 4.0–10.3)

## 2017-01-20 MED ORDER — POTASSIUM CHLORIDE 2 MEQ/ML IV SOLN: Freq: Once | INTRAVENOUS | Status: DC

## 2017-01-20 MED ORDER — SODIUM CHLORIDE 0.9% FLUSH
10.0000 mL | INTRAVENOUS | Status: DC | PRN
  Administered 2017-01-20: 10 mL
  Filled 2017-01-20: qty 10

## 2017-01-20 MED ORDER — SODIUM CHLORIDE 0.9 % IV SOLN
Freq: Once | INTRAVENOUS | Status: AC
  Administered 2017-01-20: 11:00:00 via INTRAVENOUS

## 2017-01-20 MED ORDER — SODIUM CHLORIDE 0.9 % IV SOLN: Freq: Once | INTRAVENOUS | Status: DC

## 2017-01-20 MED ORDER — HEPARIN SOD (PORK) LOCK FLUSH 100 UNIT/ML IV SOLN
500.0000 [IU] | Freq: Once | INTRAVENOUS | Status: AC | PRN
  Administered 2017-01-20: 500 [IU]
  Filled 2017-01-20: qty 5

## 2017-01-20 NOTE — Patient Instructions (Signed)
Dehydration, Adult Dehydration is when there is not enough fluid or water in your body. This happens when you lose more fluids than you take in. Dehydration can range from mild to very bad. It should be treated right away to keep it from getting very bad. Symptoms of mild dehydration may include:  Thirst.  Dry lips.  Slightly dry mouth.  Dry, warm skin.  Dizziness. Symptoms of moderate dehydration may include:  Very dry mouth.  Muscle cramps.  Dark pee (urine). Pee may be the color of tea.  Your body making less pee.  Your eyes making fewer tears.  Heartbeat that is uneven or faster than normal (palpitations).  Headache.  Light-headedness, especially when you stand up from sitting.  Fainting (syncope). Symptoms of very bad dehydration may include:  Changes in skin, such as: ? Cold and clammy skin. ? Blotchy (mottled) or pale skin. ? Skin that does not quickly return to normal after being lightly pinched and let go (poor skin turgor).  Changes in body fluids, such as: ? Feeling very thirsty. ? Your eyes making fewer tears. ? Not sweating when body temperature is high, such as in hot weather. ? Your body making very little pee.  Changes in vital signs, such as: ? Weak pulse. ? Pulse that is more than 100 beats a minute when you are sitting still. ? Fast breathing. ? Low blood pressure.  Other changes, such as: ? Sunken eyes. ? Cold hands and feet. ? Confusion. ? Lack of energy (lethargy). ? Trouble waking up from sleep. ? Short-term weight loss. ? Unconsciousness. Follow these instructions at home:  If told by your doctor, drink an ORS: ? Make an ORS by using instructions on the package. ? Start by drinking small amounts, about  cup (120 mL) every 5-10 minutes. ? Slowly drink more until you have had the amount that your doctor said to have.  Drink enough clear fluid to keep your pee clear or pale yellow. If you were told to drink an ORS, finish the ORS  first, then start slowly drinking clear fluids. Drink fluids such as: ? Water. Do not drink only water by itself. Doing that can make the salt (sodium) level in your body get too low (hyponatremia). ? Ice chips. ? Fruit juice that you have added water to (diluted). ? Low-calorie sports drinks.  Avoid: ? Alcohol. ? Drinks that have a lot of sugar. These include high-calorie sports drinks, fruit juice that does not have water added, and soda. ? Caffeine. ? Foods that are greasy or have a lot of fat or sugar.  Take over-the-counter and prescription medicines only as told by your doctor.  Do not take salt tablets. Doing that can make the salt level in your body get too high (hypernatremia).  Eat foods that have minerals (electrolytes). Examples include bananas, oranges, potatoes, tomatoes, and spinach.  Keep all follow-up visits as told by your doctor. This is important. Contact a doctor if:  You have belly (abdominal) pain that: ? Gets worse. ? Stays in one area (localizes).  You have a rash.  You have a stiff neck.  You get angry or annoyed more easily than normal (irritability).  You are more sleepy than normal.  You have a harder time waking up than normal.  You feel: ? Weak. ? Dizzy. ? Very thirsty.  You have peed (urinated) only a small amount of very dark pee during 6-8 hours. Get help right away if:  You have symptoms of   very bad dehydration.  You cannot drink fluids without throwing up (vomiting).  Your symptoms get worse with treatment.  You have a fever.  You have a very bad headache.  You are throwing up or having watery poop (diarrhea) and it: ? Gets worse. ? Does not go away.  You have blood or something green (bile) in your throw-up.  You have blood in your poop (stool). This may cause poop to look black and tarry.  You have not peed in 6-8 hours.  You pass out (faint).  Your heart rate when you are sitting still is more than 100 beats a  minute.  You have trouble breathing. This information is not intended to replace advice given to you by your health care provider. Make sure you discuss any questions you have with your health care provider. Document Released: 12/07/2008 Document Revised: 08/31/2015 Document Reviewed: 04/06/2015 Elsevier Interactive Patient Education  2018 [employer].   Hyponatremia Hyponatremia is when the amount of salt (sodium) in your blood is too low. When salt levels are low, your cells absorb extra water and they swell. The swelling happens throughout the body, but it mostly affects the brain. Follow these instructions at home:  Take medicines only as told by your doctor. Many medicines can make this condition worse. Talk with your doctor about any medicines that you are currently taking.  Carefully follow a recommended diet as told by your doctor.  Carefully follow instructions from your doctor about fluid restrictions.  Keep all follow-up visits as told by your doctor. This is important.  Do not drink alcohol. Contact a doctor if:  You feel sicker to your stomach (nauseous).  You feel more confused.  You feel more tired (fatigued).  Your headache gets worse.  You feel weaker.  Your symptoms go away and then they come back.  You have trouble following the diet instructions. Get help right away if:  You start to twitch and shake (have a seizure).  You pass out (faint).  You keep having watery poop (diarrhea).  You keep throwing up (vomiting). This information is not intended to replace advice given to you by your health care provider. Make sure you discuss any questions you have with your health care provider. Document Released: 10/23/2010 Document Revised: 07/19/2015 Document Reviewed: 02/06/2014 Elsevier Interactive Patient Education  [employer].

## 2017-01-20 NOTE — Progress Notes (Signed)
Pt plt count 58 today. Additionally Na 130 and Cl 97. No treatment, but 1L NS over an hour for replacement per Dr. Irene Limbo.

## 2017-01-21 ENCOUNTER — Ambulatory Visit
Admission: RE | Admit: 2017-01-21 | Discharge: 2017-01-21 | Disposition: A | Discharge status: Home / Self Care | Payer: 59 | Source: Ambulatory Visit | Attending: Radiation Oncology | Admitting: Radiation Oncology

## 2017-01-21 ENCOUNTER — Telehealth: Payer: Self-pay | Admitting: Nutrition

## 2017-01-21 ENCOUNTER — Telehealth: Payer: Self-pay | Admitting: *Deleted

## 2017-01-21 DIAGNOSIS — Z510 Encounter for antineoplastic radiation therapy: Secondary | ICD-10-CM | POA: Diagnosis not present

## 2017-01-21 MED ORDER — OSMOLITE 1.5 CAL PO LIQD: ORAL | 0 refills | Status: DC

## 2017-01-21 NOTE — Telephone Encounter (Signed)
Oncology Nurse Navigator Documentation  Patient's wife call to inform he has had low-grade fever of 99.4 and 99.2 yesterday, 99.1 this morning.  She voiced understanding >100.4 is temp of concern when receiving chemo but these temps above his baseline which is slightly under 98.0.  I encouraged further monitoring through the day, asked her to call if temp continues to trend upwards.  She voiced understanding.  Dr. Irene Limbo and his RN notified.  Gayleen Orem, RN, BSN, [place] Oncology Nurse [place] at [place] [phone]

## 2017-01-21 NOTE — Telephone Encounter (Signed)
Contacted patient by telephone and spoke with his wife.  The patient had a difficult weekend was unable to receive chemotherapy on Tuesday.  Patient has started to use his feeding tube for nutrition support.  He has tolerated one bottle Osmolite 1.5 at a time and is slowly advancing Juliann Pulse has purchased a variety of oral nutrition supplements for patient to choose from.  However, patient is more inclined to use his feeding tube.  Estimated nutrition needs: 2200-2400 calories, 115-105 grams protein, 2.4 L fluid.  Nutrition diagnosis:  Predicted suboptimal energy intake has evolved into inadequate oral intake related to tonsil cancer and associated treatments as evidenced by no prior need for nutrition related information.  Intervention: Patient will begin one bottle Osmolite 1.5 4 times a day with 60 milliliters of free water before and after bolus feeding.  He will advance to 1-1/2 bottles 4 times a day as tolerated.  And finally, he will give 1-1/2 bottles 3 times a day and 2 bottles once a day.    This totals 6-1/2 bottles of Osmolite 1.5 daily and will provide 2308 cal, 97 g protein, 1177 mL free water.    in addition patient will drink a minimum of 250 mL free water 3 times a day. Patient will continue to swallow liquids as tolerated. [facility] notified. Teach back method used and questions were answered.  Monitoring, evaluation, goals: Patient will tolerate tube feeding advancement to provide greater than 90% of minimum estimated nutrition needs.  Next visit: Tuesday, December 4, during infusion.  **Disclaimer: This note was dictated with voice recognition software. Similar sounding words can inadvertently be transcribed and this note may contain transcription errors which may not have been corrected upon publication of note.**

## 2017-01-22 ENCOUNTER — Encounter: Payer: Self-pay | Admitting: Radiation Oncology

## 2017-01-22 ENCOUNTER — Ambulatory Visit
Admission: RE | Admit: 2017-01-22 | Discharge: 2017-01-22 | Disposition: A | Discharge status: Home / Self Care | Payer: 59 | Source: Ambulatory Visit | Attending: Radiation Oncology | Admitting: Radiation Oncology

## 2017-01-22 ENCOUNTER — Ambulatory Visit: Payer: 59 | Admitting: Family Medicine

## 2017-01-22 ENCOUNTER — Telehealth: Payer: Self-pay | Admitting: *Deleted

## 2017-01-22 ENCOUNTER — Encounter: Payer: Self-pay | Admitting: General Practice

## 2017-01-22 DIAGNOSIS — Z510 Encounter for antineoplastic radiation therapy: Secondary | ICD-10-CM | POA: Diagnosis not present

## 2017-01-22 NOTE — Progress Notes (Signed)
[place] Psychosocial Distress Screening Clinical Social Work  Clinical Social Work was referred by distress screening protocol.  The patient scored a 7 on the Psychosocial Distress Thermometer which indicates moderate distress. Clinical Social Worker Edwyna Shell to assess for distress and other psychosocial needs. Reports experience is stressful, overwhelming, scary.  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  Encouraged wife to access [facility] services, she will stop by Center to pick up information packet tomorrow.    ONCBCN DISTRESS SCREENING 12/16/2016  Screening Type Initial Screening  Distress experienced in past week (1-10) 7  Emotional problem type Nervousness/Anxiety;Adjusting to illness  Spiritual/Religous concerns type Facing my mortality    Clinical Social Worker follow up needed: No.  If yes, follow up plan:

## 2017-01-22 NOTE — Telephone Encounter (Signed)
Oncology Nurse Navigator Documentation  Spoke with patient wife in follow-up to her call yesterday re recent temps.   She reported:  Temp this morning 99.1, recognized this will likely be his baseline while going through tmt.  Spoke with Nutrition yesterday, received guidance on increasing nutritional supplement orally or via PEG.  He is alreqady feeling better with increase. I informed her of appt w/ [place] following this morning's RT, explained I will meet them to coordinate.  Gayleen Orem, RN, BSN, [place] Oncology Nurse [place] at [place] [phone]

## 2017-01-22 NOTE — Progress Notes (Signed)
Financial Counselor--spoke with patient and wife--answered questions regarding insurance--declined [place] application--gave them my card to call with any additional questions

## 2017-01-23 ENCOUNTER — Telehealth: Payer: Self-pay | Admitting: *Deleted

## 2017-01-23 ENCOUNTER — Ambulatory Visit: Payer: 59

## 2017-01-23 ENCOUNTER — Ambulatory Visit
Admission: RE | Admit: 2017-01-23 | Discharge: 2017-01-23 | Disposition: A | Discharge status: Home / Self Care | Payer: 59 | Source: Ambulatory Visit | Attending: Radiation Oncology | Admitting: Radiation Oncology

## 2017-01-23 DIAGNOSIS — Z510 Encounter for antineoplastic radiation therapy: Secondary | ICD-10-CM | POA: Diagnosis not present

## 2017-01-23 MED FILL — LIDOCAINE 2% VISCOUS SOLN: 2 | 3 days supply | Qty: 100 | Fill #3

## 2017-01-23 NOTE — Telephone Encounter (Signed)
Oncology Nurse Navigator Documentation  Sent fax to [facility] requesting repeat audiology exam by 12/14 followed by [facility] requesting exam by 12/7.  Requested call/email when appt scheduled.  'Successful TX Notice' received.  Gayleen Orem, RN, BSN, [place] [facility] Oncology Nurse [place] at [place] [phone]

## 2017-01-26 ENCOUNTER — Encounter: Payer: Self-pay | Admitting: *Deleted

## 2017-01-26 ENCOUNTER — Other Ambulatory Visit: Payer: Self-pay

## 2017-01-26 ENCOUNTER — Ambulatory Visit
Admission: RE | Admit: 2017-01-26 | Discharge: 2017-01-26 | Disposition: A | Discharge status: Home / Self Care | Payer: 59 | Source: Ambulatory Visit | Attending: Radiation Oncology | Admitting: Radiation Oncology

## 2017-01-26 ENCOUNTER — Other Ambulatory Visit (INDEPENDENT_AMBULATORY_CARE_PROVIDER_SITE_OTHER): Payer: Self-pay

## 2017-01-26 DIAGNOSIS — C090 Malignant neoplasm of tonsillar fossa: Secondary | ICD-10-CM

## 2017-01-26 DIAGNOSIS — Z510 Encounter for antineoplastic radiation therapy: Secondary | ICD-10-CM | POA: Diagnosis not present

## 2017-01-26 NOTE — Progress Notes (Signed)
Oncology Nurse Navigator Documentation  Patient file review, Navigator Flowsheet update.  Gayleen Orem, RN, BSN, [place] [facility] Oncology Nurse [place] at [place] [phone]

## 2017-01-27 ENCOUNTER — Telehealth: Payer: Self-pay | Admitting: *Deleted

## 2017-01-27 ENCOUNTER — Other Ambulatory Visit: Payer: 59

## 2017-01-27 ENCOUNTER — Encounter: Payer: Self-pay | Admitting: Hematology and Oncology

## 2017-01-27 ENCOUNTER — Ambulatory Visit (HOSPITAL_BASED_OUTPATIENT_CLINIC_OR_DEPARTMENT_OTHER): Payer: 59 | Admitting: Hematology and Oncology

## 2017-01-27 ENCOUNTER — Ambulatory Visit: Payer: 59

## 2017-01-27 ENCOUNTER — Ambulatory Visit
Admission: RE | Admit: 2017-01-27 | Discharge: 2017-01-27 | Disposition: A | Discharge status: Home / Self Care | Payer: 59 | Source: Ambulatory Visit | Attending: Radiation Oncology | Admitting: Radiation Oncology

## 2017-01-27 ENCOUNTER — Ambulatory Visit: Payer: 59 | Admitting: Nutrition

## 2017-01-27 VITALS — Ht 69.0 in | Wt 202.0 lb

## 2017-01-27 DIAGNOSIS — C090 Malignant neoplasm of tonsillar fossa: Secondary | ICD-10-CM

## 2017-01-27 DIAGNOSIS — Z510 Encounter for antineoplastic radiation therapy: Secondary | ICD-10-CM | POA: Diagnosis not present

## 2017-01-27 DIAGNOSIS — Z5111 Encounter for antineoplastic chemotherapy: Secondary | ICD-10-CM

## 2017-01-27 LAB — COMPREHENSIVE METABOLIC PANEL
ALT: 48 U/L (ref 0–55)
AST: 26 U/L (ref 5–34)
Albumin: 3.3 g/dL — ABNORMAL LOW (ref 3.5–5.0)
Alkaline Phosphatase: 110 U/L (ref 40–150)
Anion Gap: 8 mEq/L (ref 3–11)
BUN: 22.6 mg/dL (ref 7.0–26.0)
CALCIUM: 9.1 mg/dL (ref 8.4–10.4)
CHLORIDE: 99 meq/L (ref 98–109)
CO2: 26 meq/L (ref 22–29)
CREATININE: 1 mg/dL (ref 0.7–1.3)
EGFR: >60 mL/min/{1.73_m2} (ref >60)
Glucose: 96 mg/dl (ref 70–140)
POTASSIUM: 4.3 meq/L (ref 3.5–5.1)
SODIUM: 133 meq/L — AB (ref 136–145)
Total Bilirubin: 0.59 mg/dL (ref 0.20–1.20)
Total Protein: 6.5 g/dL (ref 6.4–8.3)

## 2017-01-27 LAB — CBC WITH DIFFERENTIAL/PLATELET
BASO%: 0 % (ref 0.0–2.0)
BASOS ABS: 0 10*3/uL (ref 0.0–0.1)
EOS ABS: 0 10*3/uL (ref 0.0–0.5)
EOS%: 1.7 % (ref 0.0–7.0)
HCT: 36.1 % — ABNORMAL LOW (ref 38.4–49.9)
HEMOGLOBIN: 12.3 g/dL — AB (ref 13.0–17.1)
LYMPH%: 35.3 % (ref 14.0–49.0)
MCH: 30.8 pg (ref 27.2–33.4)
MCHC: 34.1 g/dL (ref 32.0–36.0)
MCV: 90.3 fL (ref 79.3–98.0)
MONO#: 0.1 10*3/uL (ref 0.1–0.9)
MONO%: 9.5 % (ref 0.0–14.0)
NEUT%: 53.5 % (ref 39.0–75.0)
NEUTROS ABS: 0.6 10*3/uL — AB (ref 1.5–6.5)
PLATELETS: 55 10*3/uL — AB (ref 140–400)
RBC: 4 10*6/uL — AB (ref 4.20–5.82)
RDW: 13.7 % (ref 11.0–14.6)
WBC: 1.2 10*3/uL — AB (ref 4.0–10.3)
lymph#: 0.4 10*3/uL — ABNORMAL LOW (ref 0.9–3.3)

## 2017-01-27 LAB — MAGNESIUM: Magnesium: 2.1 mg/dl (ref 1.5–2.5)

## 2017-01-27 MED ORDER — OXYCODONE HCL 5 MG PO TABS: 5.0000 mg | ORAL_TABLET | ORAL | 0 refills | Status: DC | PRN

## 2017-01-27 MED FILL — oxyCODONE HCL 5 MG TABS: 5 | 8 days supply | Qty: 50 | Fill #0

## 2017-01-27 NOTE — Progress Notes (Signed)
Patient will not receive chemotherapy today per wife. Patient is unable to tolerate more than one bottle Osmolite 1.5 via PEG at one time because he feels full and bloated. Juliann Pulse is trying to provide a variety of liquids and foods.  Weight documented as 202 pounds, December 4 decreased from 209.1 pounds November 20. Juliann Pulse also reports they have not received formula.  Estimated nutrition needs: 2200-2400 calories, 115-125 grams protein, 2.4 L fluid.  Nutrition diagnosis: Inadequate oral intake continues.  Intervention: Recommended patient give one bottle Osmolite 1.5 every 3 hours for a total of 5 bottles daily.   Recommended 60 cc of free water before and after bolus feeding. Contacted [facility] who states that they are out of Osmolite 1.5 formula and will substitute Ensure Plus. [facility] will contact patient and wife to convey this substitute. Questions answered.  Teach back method used.  Monitoring, evaluation, goals: Patient will tolerate tube feeding and oral intake to minimize weight loss.  Next visit: Tuesday, December 11.  **Disclaimer: This note was dictated with voice recognition software. Similar sounding words can inadvertently be transcribed and this note may contain transcription errors which may not have been corrected upon publication of note.**

## 2017-01-27 NOTE — Telephone Encounter (Signed)
Oncology Nurse Navigator Documentation  Received e-mail from [facility] informing patient has repeat audiology evaluation 12/6 1100.  Dr. Irene Limbo informed.  Gayleen Orem, RN, BSN, [place] [facility] Oncology Nurse [place] at [place] [phone]

## 2017-01-27 NOTE — Patient Instructions (Signed)
Neutropenia Neutropenia is a condition that occurs when you have a lower-than-normal level of a type of white blood cell (neutrophil) in your body. Neutrophils are made in the spongy center of large bones (bone marrow) and they fight infections. Neutrophils are your body's main defense against bacterial and fungal infections. The fewer neutrophils you have and the longer your body remains without them, the greater your risk of getting a severe infection. What are the causes? This condition can occur if your body uses up or destroys neutrophils faster than your bone marrow can make them. This problem may happen because of:  Bacterial or fungal infection.  Allergic disorders.  Reactions to some medicines.  Autoimmune disease.  An enlarged spleen.  This condition can also occur if your bone marrow does not produce enough neutrophils. This problem may be caused by:  Cancer.  Cancer treatments, such as radiation or chemotherapy.  Viral infections.  Medicines, such as phenytoin.  Vitamin B12 deficiency.  Diseases of the bone marrow.  Environmental toxins, such as insecticides.  What are the signs or symptoms? This condition does not usually cause symptoms. If symptoms are present, they are usually caused by an underlying infection. Symptoms of an infection may include:  Fever.  Chills.  Swollen glands.  Oral or anal ulcers.  Cough and shortness of breath.  Rash.  Skin infection.  Fatigue.  How is this diagnosed? Your health care provider may suspect neutropenia if you have:  A condition that may cause neutropenia.  Symptoms of infection, especially fever.  Frequent and unusual infections.  You will have a medical history and physical exam. Tests will also be done, such as:  A complete blood count (CBC).  A procedure to collect a sample of bone marrow for examination (bone marrow biopsy).  A chest X-ray.  A urine culture.  A blood culture.  How is this  treated? Treatment depends on the underlying cause and severity of your condition. Mild neutropenia may not require treatment. Treatment may include medicines, such as:  Antibiotic medicine given through an IV tube.  Antiviral medicines.  Antifungal medicines.  A medicine to increase neutrophil production (colony-stimulating factor). You may get this drug through an IV tube or by injection.  Steroids given through an IV tube.  If an underlying condition is causing neutropenia, you may need treatment for that condition. If medicines you are taking are causing neutropenia, your health care provider may have you stop taking those medicines. Follow these instructions at home: Medicines  Take over-the-counter and prescription medicines only as told by your health care provider.  Get a seasonal flu shot (influenza vaccine). Lifestyle  Do not eat unpasteurized foods.Do not eat unwashed raw fruits or vegetables.  Avoid exposure to groups of people or children.  Avoid being around people who are sick.  Avoid being around dirt or dust, such as in construction areas or gardens.  Do not provide direct care for pets. Avoid animal droppings. Do not clean litter boxes and bird cages. Hygiene   Bathe daily.  Clean the area between the genitals and the anus (perineal area) after you urinate or have a bowel movement. If you are male, wipe from front to back.  Brush your teeth with a soft toothbrush before and after meals.  Do not use a razor that has a blade. Use an electric razor to remove hair.  Wash your hands often. Make sure others who come in contact with you also wash their hands. If soap and water  are not available, use hand sanitizer. General instructions  Do not have sex unless your health care provider has approved.  Take actions to avoid cuts and burns. For example: ? Be cautious when you use knives. Always cut away from yourself. ? Keep knives in protective sheaths or  guards when not in use. ? Use oven mitts when you cook with a hot stove, oven, or grill. ? Stand a safe distance away from open fires.  Avoid people who received a vaccine in the past 30 days if that vaccine contained a live version of the germ (live vaccine). You should not get a live vaccine. Common live vaccines are varicella, measles, mumps, and rubella.  Do not share food utensils.  Do not use tampons, enemas, or rectal suppositories unless your health care provider has approved.  Keep all appointments as told by your health care provider. This is important. Contact a health care provider if:  You have a fever.  You have chills or you start to shake.  You have: ? A sore throat. ? A warm, red, or tender area on your skin. ? A cough. ? Frequent or painful urination. ? Vaginal discharge or itching.  You develop: ? Sores in your mouth or anus. ? Swollen lymph nodes. ? Red streaks on the skin. ? A rash.  You feel: ? Nauseous or you vomit. ? Very fatigued. ? Short of breath. This information is not intended to replace advice given to you by your health care provider. Make sure you discuss any questions you have with your health care provider. Document Released: 08/02/2001 Document Revised: 07/19/2015 Document Reviewed: 08/23/2014 Elsevier Interactive Patient Education  2018 [employer]  

## 2017-01-27 NOTE — Progress Notes (Signed)
Based on lab work today. Treatment will not be given per Dr. Lebron Conners. ANC 0.6 and plt 55. Pt noted some low grade fevers 99.1-99.6 fahrenheit at home. Dr. Lebron Conners would like to see pt in clinic today. Appt added and pt made aware.

## 2017-01-28 ENCOUNTER — Ambulatory Visit
Admission: RE | Admit: 2017-01-28 | Discharge: 2017-01-28 | Disposition: A | Discharge status: Home / Self Care | Payer: 59 | Source: Ambulatory Visit | Attending: Radiation Oncology | Admitting: Radiation Oncology

## 2017-01-28 DIAGNOSIS — Z510 Encounter for antineoplastic radiation therapy: Secondary | ICD-10-CM | POA: Diagnosis not present

## 2017-01-28 LAB — PHOSPHORUS: PHOSPHORUS: 3.9 mg/dL (ref 2.5–4.5)

## 2017-01-29 ENCOUNTER — Ambulatory Visit
Admission: RE | Admit: 2017-01-29 | Discharge: 2017-01-29 | Disposition: A | Discharge status: Home / Self Care | Payer: 59 | Source: Ambulatory Visit | Attending: Radiation Oncology | Admitting: Radiation Oncology

## 2017-01-29 DIAGNOSIS — Z510 Encounter for antineoplastic radiation therapy: Secondary | ICD-10-CM | POA: Diagnosis not present

## 2017-01-30 ENCOUNTER — Ambulatory Visit
Admission: RE | Admit: 2017-01-30 | Discharge: 2017-01-30 | Disposition: A | Discharge status: Home / Self Care | Payer: 59 | Source: Ambulatory Visit | Attending: Radiation Oncology | Admitting: Radiation Oncology

## 2017-01-30 ENCOUNTER — Ambulatory Visit: Payer: 59

## 2017-01-30 DIAGNOSIS — Z510 Encounter for antineoplastic radiation therapy: Secondary | ICD-10-CM | POA: Diagnosis not present

## 2017-02-02 ENCOUNTER — Ambulatory Visit: Payer: 59 | Admitting: Radiation Oncology

## 2017-02-02 ENCOUNTER — Ambulatory Visit: Payer: 59

## 2017-02-03 ENCOUNTER — Other Ambulatory Visit: Payer: Self-pay | Admitting: *Deleted

## 2017-02-03 ENCOUNTER — Ambulatory Visit
Admission: RE | Admit: 2017-02-03 | Discharge: 2017-02-03 | Disposition: A | Discharge status: Home / Self Care | Payer: 59 | Source: Ambulatory Visit | Attending: Radiation Oncology | Admitting: Radiation Oncology

## 2017-02-03 ENCOUNTER — Ambulatory Visit (HOSPITAL_BASED_OUTPATIENT_CLINIC_OR_DEPARTMENT_OTHER): Payer: 59 | Admitting: Hematology

## 2017-02-03 ENCOUNTER — Encounter: Payer: Self-pay | Admitting: Hematology

## 2017-02-03 ENCOUNTER — Ambulatory Visit: Payer: 59

## 2017-02-03 ENCOUNTER — Other Ambulatory Visit (HOSPITAL_BASED_OUTPATIENT_CLINIC_OR_DEPARTMENT_OTHER): Payer: 59

## 2017-02-03 ENCOUNTER — Encounter: Payer: 59 | Admitting: Nutrition

## 2017-02-03 ENCOUNTER — Encounter: Payer: Self-pay | Admitting: Nutrition

## 2017-02-03 ENCOUNTER — Other Ambulatory Visit: Payer: Self-pay

## 2017-02-03 DIAGNOSIS — Z510 Encounter for antineoplastic radiation therapy: Secondary | ICD-10-CM | POA: Diagnosis not present

## 2017-02-03 DIAGNOSIS — C090 Malignant neoplasm of tonsillar fossa: Secondary | ICD-10-CM

## 2017-02-03 DIAGNOSIS — R300 Dysuria: Secondary | ICD-10-CM

## 2017-02-03 DIAGNOSIS — K1233 Oral mucositis (ulcerative) due to radiation: Secondary | ICD-10-CM

## 2017-02-03 DIAGNOSIS — D701 Agranulocytosis secondary to cancer chemotherapy: Secondary | ICD-10-CM

## 2017-02-03 DIAGNOSIS — R3911 Hesitancy of micturition: Secondary | ICD-10-CM

## 2017-02-03 DIAGNOSIS — C099 Malignant neoplasm of tonsil, unspecified: Secondary | ICD-10-CM

## 2017-02-03 DIAGNOSIS — R509 Fever, unspecified: Secondary | ICD-10-CM

## 2017-02-03 DIAGNOSIS — D702 Other drug-induced agranulocytosis: Secondary | ICD-10-CM

## 2017-02-03 DIAGNOSIS — D649 Anemia, unspecified: Secondary | ICD-10-CM | POA: Diagnosis not present

## 2017-02-03 LAB — CBC WITH DIFFERENTIAL/PLATELET
BASO%: 0.4 % (ref 0.0–2.0)
BASOS ABS: 0 10*3/uL (ref 0.0–0.1)
EOS ABS: 0 10*3/uL (ref 0.0–0.5)
EOS%: 1.6 % (ref 0.0–7.0)
HEMATOCRIT: 33.8 % — AB (ref 38.4–49.9)
HEMOGLOBIN: 11.7 g/dL — AB (ref 13.0–17.1)
LYMPH#: 0.5 10*3/uL — AB (ref 0.9–3.3)
LYMPH%: 29.1 % (ref 14.0–49.0)
MCH: 31.3 pg (ref 27.2–33.4)
MCHC: 34.7 g/dL (ref 32.0–36.0)
MCV: 90.3 fL (ref 79.3–98.0)
MONO#: 0.6 10*3/uL (ref 0.1–0.9)
MONO%: 30.6 % — ABNORMAL HIGH (ref 0.0–14.0)
NEUT%: 38.3 % — ABNORMAL LOW (ref 39.0–75.0)
NEUTROS ABS: 0.7 10*3/uL — AB (ref 1.5–6.5)
Platelets: 171 10*3/uL (ref 140–400)
RBC: 3.74 10*6/uL — ABNORMAL LOW (ref 4.20–5.82)
RDW: 14.6 % (ref 11.0–14.6)
WBC: 1.8 10*3/uL — AB (ref 4.0–10.3)

## 2017-02-03 LAB — COMPREHENSIVE METABOLIC PANEL
ALBUMIN: 3.3 g/dL — AB (ref 3.5–5.0)
ALK PHOS: 100 U/L (ref 40–150)
ALT: 34 U/L (ref 0–55)
ANION GAP: 8 meq/L (ref 3–11)
AST: 21 U/L (ref 5–34)
BILIRUBIN TOTAL: 0.5 mg/dL (ref 0.20–1.20)
BUN: 16.1 mg/dL (ref 7.0–26.0)
CALCIUM: 9.4 mg/dL (ref 8.4–10.4)
CO2: 27 mEq/L (ref 22–29)
Chloride: 98 mEq/L (ref 98–109)
Creatinine: 1 mg/dL (ref 0.7–1.3)
Glucose: 114 mg/dl (ref 70–140)
POTASSIUM: 4.5 meq/L (ref 3.5–5.1)
SODIUM: 133 meq/L — AB (ref 136–145)
TOTAL PROTEIN: 6.6 g/dL (ref 6.4–8.3)

## 2017-02-03 LAB — URINALYSIS, MICROSCOPIC - CHCC
BILIRUBIN (URINE): NEGATIVE
BLOOD: NEGATIVE
Glucose: NEGATIVE mg/dL
KETONES: NEGATIVE mg/dL
Leukocyte Esterase: NEGATIVE
Nitrite: NEGATIVE
PH: 6.5 (ref 4.6–8.0)
PROTEIN: NEGATIVE mg/dL
RBC / HPF: NEGATIVE (ref 0–2)
SPECIFIC GRAVITY, URINE: 1.005 (ref 1.003–1.035)
Urobilinogen, UR: 0.2 mg/dL (ref 0.2–1)
WBC, UA: NEGATIVE (ref 0–?)

## 2017-02-03 MED ORDER — MAGIC MOUTHWASH W/LIDOCAINE: 5.0000 mL | Freq: Four times a day (QID) | ORAL | 1 refills | Status: DC | PRN

## 2017-02-03 MED ORDER — MORPHINE SULFATE ER 15 MG PO TBCR: 15.0000 mg | EXTENDED_RELEASE_TABLET | Freq: Two times a day (BID) | ORAL | 0 refills | Status: DC

## 2017-02-03 MED ORDER — OXYCODONE HCL 5 MG PO TABS: 5.0000 mg | ORAL_TABLET | ORAL | 0 refills | Status: DC | PRN

## 2017-02-03 MED FILL — MORPHINE SULF ER 15 MG TAB: 15 | 30 days supply | Qty: 60 | Fill #0

## 2017-02-03 MED FILL — LIDOCAINE 2% VISCOUS SOLN: 2 | 3 days supply | Qty: 100 | Fill #4

## 2017-02-03 MED FILL — CMPD MMW LID/MAALOX/DIPHEN: 15 days supply | Qty: 300 | Fill #0

## 2017-02-03 MED FILL — oxyCODONE HCL 5 MG TABS: 5 | 5 days supply | Qty: 60 | Fill #0

## 2017-02-03 NOTE — Patient Instructions (Signed)
Treatment held per Dr. Irene Limbo.

## 2017-02-03 NOTE — Progress Notes (Signed)
Dr. Irene Limbo at chairside to assess pt and patients concerns were addressed, Pain medication prescription given to pt by desk nurse. Per Dr. Irene Limbo no treatment today, pt aware, Urine sample collected per MD order. Pt stable at discharge.

## 2017-02-03 NOTE — Progress Notes (Signed)
HEMATOLOGY/ONCOLOGY FOLLOW UP VISIT NOTE  Date of Service: 02/03/2017  Patient Care Team: Claretta Fraise, MD as PCP - General (Family Medicine) Jodi Marble, MD as Consulting Physician (Otolaryngology) Eppie Gibson, MD as Attending Physician (Radiation Oncology) Leota Sauers, RN as Oncology Nurse Navigator (Oncology) Karie Mainland, RD as Dietitian (Nutrition) Jomarie Longs, PT as Physical Therapist (Physical Therapy) Sharen Counter, CCC-SLP as Speech Language Pathologist (Speech Pathology) Kennith Center, LCSW as Social Worker  CHIEF COMPLAINTS/PURPOSE OF CONSULTATION:  Newly diagnosed tonsillar squamous cell carcinoma  HISTORY OF PRESENTING ILLNESS:   Terry Pacheco is a wonderful 66 y.o. male who has been referred to [place] by ENT Specialist, Dr. Erik Obey for evaluation and management of invasive squamous cell carcinoma of tonsil.   He has a PMHx of GERD, HTN, High cholesterol which he takes medications for. He denies PMHx of seizures. He is accompanied by his wife and sister today. He reports that he is doing well overall. The pt initially presented with sudden-onset right lateral tongue numbness that has been ongoing for approximately 1 year. Subsequently, he had left sided gum swelling and intermittent sore throat symptoms. He notes no stroke-like symptoms. He notes that following these symptoms, he was evaluated by multiple providers including an ENT specialist and a dentist prior to meeting with Dr. Erik Obey. Pt wife noted swelling and discoloration inside his mouth prior to the patient being evaluated by Dr. Erik Obey. At his first visit with Dr. Erik Obey, he noted an abnormality to his left tonsil and completed a biopsy that same day.   He has had CT soft tissue neck with contrast on 11/18/2016 with results showing: Fullness of the right palatine tonsil. Surrounding fat planes are ill-defined. This area is obscured by dental artifact. Possible right tonsillar carcinoma.     The patient has had biopsy completed on 11/19/2016 with results of: "RIGHT FAUCIAL TONSIL", BIOPSY: Squamous cell carcinoma, suspicious for superficial invasion in this material. Immunostain p16 positive.   He also had a PET scan completed on 12/02/2016 with results revealing: IMPRESSION: 1. Mildly asymmetric tonsillar activity along the right tonsillar sinus, maximum SUV 7.7 as compared to the left side 5.2, suspicious in this setting for right-sided malignancy. There is also a mildly enlarged and hypermetabolic right level IIa lymph node with maximum SUV of 8.4. No evidence of other metastatic spread. 2. Faint accentuation of activity in the left lateral prostate gland apex, but low-grade. Correlate with PSA level in determining whether further workup is warranted. 3. Other imaging findings of potential clinical significance: Aortic Atherosclerosis (ICD10-I70.0) and Emphysema (ICD10-J43.9). Coronary atherosclerosis. Small type 1 hiatal hernia. Bilateral renal cysts. Colonic diverticulosis. Lastly, he had a MR Face trigeminal on 12/09/2016 with results of IMPRESSION: 1. No evidence of perineural spread of malignancy. 2. Mild asymmetry of the palatine tonsils without discrete mass. Correlate with direct visualization.   As far as surgeries, he has had a polyp on left sided throat that was removed 3-4 years ago that resulted negative for malignancy. He also has had a right hip replacement. Lastly, he has had a left inguinal hernia operation in 1978. He started smoking cigarettes at age 51 and would smoke 1 PPD while working. When he retired several years ago he smoked 0.5 PPD and has quit smoking cigarettes 1 month ago since his diagnosis. He states that he used a vape after quitting but was advised to quit via Dr. Isidore Moos. He occassionally consumes ETOH. 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 Denies allergies to medications at this time. He reports that his  father died of a stroke at age 36 and his mother died from cardiac issues. Mother had gastric cancer and was diagnosed in late 45's. Denies any other cancers of blood disorders in the family.    On review of systems, he denies hematuria, dysuria, or urinary retention. He reports urinary frequency over the past year. He reports bowel incontinence following a bowel movement x 1 year that has gradually improved more recently. He notes that his bowel incontinence has been intermittent. He has well formed stools without blood in stools or rectal bleeding. He denies increase in bowel movements and he has up to 1 bowel movement daily. He has had 2-3 occurrences of hemorrhoids with no recent flares. He denies mucous in his stools. He denies injuries or surgeries to his rectal area. He denies bladder incontinence. He has lower back pain that has been chronic and unchanged. He has a prior hx of diverticulitis approximately 10 years ago that was followed with colonoscopy and was then diagnosed with diverticulosis. He denies headache at this time. He is able to ambulate for prolonged periods without dyspnea on exertion. 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 He has had an audiogram completed by an ENT specialist in [place], [place]:  Cisplatin weeks with concurrent XRT starting 12/30/16 plan to complete   INTERIM HISTORY:   [name] presents today for scheduled follow up and cycle 6 of weekly cisplatin treatment accompanied by his wife. Overall, things have been alright for him. He reports soreness into his mouth, abdomen, and lower back. He states that he has been taking 5mg  Oxycodone q4h, but this has not been controlling his pain very well. He took it most recently this morning around 6am and he states that only a few hours following that his mouth was burning significantly again. He also notes some fever (Tmax 100.4) at home recently as  well. No chills or night sweats. He does not currently have magic mouthwash at home, but he has been using sucralfate without improvement as well. There is significant mucositis on exam. He is still tolerating only liquids PO, but this has been a challenge for him recently. His wife also reports that he has had to intermittently stop and recover from bouts of activity, she also notes that he seems short of breath during these times and he will have to catch his breath as well. He is not currently on CPAP. Additionally, he states that he has had some issues with urinary hesitancy and sensation of not fully emptying his bladder. He denies any dysuria or changes in urine color/consistency. His platelets have recovered to 171k also some mild anemia, however, he is still neutropenic at [facility] 0.7k, and we will hold his cisplatin dose this week as well.   On review of systems, pt denies chills, rash, dysuria, hematuria. Pt denies reports some nausea following drinking liquids, but he denies any vomiting. Pertinent positives are listed within the above HPI.    MEDICAL HISTORY:  Past Medical History:  Diagnosis Date  . Arthritis   . GERD (gastroesophageal reflux disease) 07/06/2014  . Hypertension   . Sleep apnea    does not use CPAP  . Wears glasses     SURGICAL HISTORY: Past Surgical History:  Procedure Laterality Date  . HERNIA REPAIR    .  IR CV LINE INJECTION  01/14/2017  . IR FLUORO GUIDE PORT INSERTION RIGHT  12/25/2016  . IR GASTROSTOMY TUBE MOD SED  12/25/2016  . IR US GUIDE VASC ACCESS RIGHT  12/25/2016  . polyp removal  2008   during colonoscopy  . TOTAL HIP ARTHROPLASTY Right 10/16/2015   Procedure: RIGHT TOTAL HIP ARTHROPLASTY ANTERIOR APPROACH;  Surgeon: Paralee Cancel, MD;  Location: [facility];  Service: Orthopedics;  Laterality: Right;  . vocal cord polyp      removed 2-3 years ago     SOCIAL HISTORY: Social History   Socioeconomic History  . Marital status: Married    Spouse name: Not  on file  . Number of children: 1  . Years of education: Not on file  . Highest education level: Not on file  Social Needs  . Financial resource strain: Not on file  . Food insecurity - worry: Not on file  . Food insecurity - inability: Not on file  . Transportation needs - medical: Not on file  . Transportation needs - non-medical: Not on file  Occupational History  . Occupation: Retired     Fish farm manager: [employer]  Tobacco Use  . Smoking status: Former Smoker    Packs/day: 0.50    Years: 45.00    Pack years: 22.50    Types: Cigarettes    Start date: 01/12/1983    Last attempt to quit: 11/17/2016    Years since quitting: 0.2  . Smokeless tobacco: Never Used  Substance and Sexual Activity  . Alcohol use: Yes    Alcohol/week: 0.0 oz    Comment: rare  . Drug use: Yes    Types: Marijuana    Comment: last use 10/09/2015  . Sexual activity: Not on file  Other Topics Concern  . Not on file  Social History Narrative  . Not on file    FAMILY HISTORY: Family History  Problem Relation Age of Onset  . Heart disease Mother   . Stroke Father     ALLERGIES:  is allergic to crestor [rosuvastatin].  MEDICATIONS:  Current Outpatient Medications  Medication Sig Dispense Refill  . amLODipine (NORVASC) 10 MG tablet TAKE 1 TABLET EVERY DAY FOR FOR BLOOD PRESSURE 90 tablet 0  . atorvastatin (LIPITOR) 40 MG tablet TAKE 1 TABLET BY MOUTH EVERY DAY (Patient not taking: Reported on 01/27/2017) 90 tablet 0  . dexamethasone (DECADRON) 4 MG tablet Take 2 tablets by mouth once a day on the day after chemotherapy and then take 2 tablets two times a day for 2 days. Take with food. 30 tablet 0  . escitalopram (LEXAPRO) 20 MG tablet TAKE 0.5 TABLETS (10 MG TOTAL) BY MOUTH DAILY. 45 tablet 0  . fluconazole (DIFLUCAN) 100 MG tablet Take 2 tablets today, then 1 tablet daily x 20 more days for yeast infection. Hold Atorvastatin while on this. 22 tablet 0  . lidocaine (XYLOCAINE) 2 % solution Mix 1  part 2%viscous lidocaine,1part H2O.Swish and swallow 57mL of this mixture, 68min before meals and at bedtime, up to QID 100 mL 5  . lidocaine-prilocaine (EMLA) cream Apply 1 application as needed topically. 30 g 5  . Multiple Vitamin (MULTIVITAMIN) tablet Take 1 tablet by mouth daily.    . Nutritional Supplements (FEEDING SUPPLEMENT, OSMOLITE 1.5 CAL,) LIQD Give 1 bottle Osmolite 1.5 via PEG QID with 60 ml free water. Increase to 1.5 bottles QID as tolerated. Increase to goal of  1.5 bottles TID and 2 bottles once daily as tolerated. Give  additional 250 mL free water three times daily. 6.5 Bottle 0  . omeprazole (PRILOSEC) 20 MG capsule TAKE 1 CAPSULE BY MOUTH EVERY DAY 90 capsule 0  . ondansetron (ZOFRAN) 8 MG tablet Take 1 tablet (8 mg total) 2 (two) times daily as needed by mouth. Start on the third day after chemotherapy. 30 tablet 0  . oxyCODONE (OXY IR/ROXICODONE) 5 MG immediate release tablet Take 1 tablet (5 mg total) by mouth every 4 (four) hours as needed for up to 14 days for severe pain. 50 tablet 0  . prochlorperazine (COMPAZINE) 10 MG tablet Take 1 tablet (10 mg total) by mouth every 6 (six) hours as needed (Nausea or vomiting). (Patient not taking: Reported on 12/30/2016) 30 tablet 1  . sodium fluoride (FLUORISHIELD) 1.1 % GEL dental gel Instill one drop of gel into each tooth space of fluoride tray. Place over teeth for 5 minutes. Remove. Spit out excess. Repeat nightly 120 mL prn  . sucralfate (CARAFATE) 1 GM/10ML suspension Take 10 mLs (1 g total) 4 (four) times daily -  with meals and at bedtime by mouth. 420 mL 1  . traZODone (DESYREL) 50 MG tablet Take 1 tablet (50 mg total) by mouth at bedtime as needed for sleep. (Patient not taking: Reported on 12/16/2016) 30 tablet 1  . TURMERIC PO Take 1,200 mg by mouth.     No current facility-administered medications for this visit.     REVIEW OF SYSTEMS:    A 10+ POINT REVIEW OF SYSTEMS WAS OBTAINED including neurology, dermatology,  psychiatry, cardiac, respiratory, lymph, extremities, GI, GU, Musculoskeletal, constitutional, breasts, reproductive, HEENT.  All pertinent positives are noted in the HPI.  All others are negative.  PHYSICAL EXAMINATION: ECOG PERFORMANCE STATUS: 1 - Symptomatic but completely ambulatory VS reviewed in EPIC GENERAL:alert, in no acute distress and comfortable SKIN: no acute rashes, no significant lesions EYES: conjunctiva are pink and non-injected, sclera anicteric OROPHARYNX: Right tonsillar mass that is close to the base of the tongue and extending into the right side of the roof of his mouth. MMM, no exudates, no oropharyngeal erythema or ulceration (+) oral Thrush, mucositis   NECK: supple, no JVD. Small 1 cm lymph node in right upper neck.  LYMPH:  no palpable lymphadenopathy in the cervical, axillary or inguinal regions LUNGS: clear to auscultation b/l with normal respiratory effort HEART: 2/6 systolic murmur. Regular rate & rhythm ABDOMEN:  normoactive bowel sounds , non tender, not distended. Extremity: no pedal edema PSYCH: alert & oriented x 3 with fluent speech NEURO: no focal motor/sensory deficits    LABORATORY DATA:  I have reviewed the data as listed  . CBC Latest Ref Rng & Units 02/03/2017 01/27/2017 01/20/2017  WBC 4.0 - 10.3 10e3/uL 1.8(L) 1.2(L) 3.5(L)  Hemoglobin 13.0 - 17.1 g/dL 11.7(L) 12.3(L) 13.4  Hematocrit 38.4 - 49.9 % 33.8(L) 36.1(L) 39.2  Platelets 140 - 400 10e3/uL 171 55(L) 58(L)   ANC 700 . CMP Latest Ref Rng & Units 02/03/2017 01/27/2017 01/20/2017  Glucose 70 - 140 mg/dl 114 96 94  BUN 7.0 - 26.0 mg/dL 16.1 22.6 28.0(H)  Creatinine 0.7 - 1.3 mg/dL 1.0 1.0 1.0  Sodium 136 - 145 mEq/L 133(L) 133(L) 130(L)  Potassium 3.5 - 5.1 mEq/L 4.5 4.3 4.4  Chloride 96 - 106 mmol/L - - -  CO2 22 - 29 mEq/L 27 26 26   Calcium 8.4 - 10.4 mg/dL 9.4 9.1 8.8  Total Protein 6.4 - 8.3 g/dL 6.6 6.5 5.9(L)  Total Bilirubin 0.20 -  1.20 mg/dL 0.50 0.59 0.67  Alkaline  Phos 40 - 150 U/L 100 110 83  AST 5 - 34 U/L 21 26 23   ALT 0 - 55 U/L 34 48 73(H)    PATHOLOGY:    RADIOGRAPHIC STUDIES: I have personally reviewed the radiological images as listed and agreed with the findings in the report. Ir Cv Line Injection  Result Date: 01/14/2017 INDICATION: History of tonsillar cancer, post Port a catheter placement by Dr. Anselm Pancoast on 12/25/2016. Difficulty with chemotherapy administration infusion yesterday at the infusion center. EXAM: FLUOROSCOPIC GUIDED PORT A CATHETER CHECK COMPARISON:  Ultrasound fluoroscopic guided Port a catheter placement - 12/25/2016 MEDICATIONS: None. CONTRAST:  None FLUOROSCOPY TIME:  6 seconds (0.3 MGy) COMPLICATIONS: None immediate. TECHNIQUE: The procedure, risks, benefits, and alternatives were explained to the patient and informed written consent was obtained. A timeout was performed prior to the initiation of the procedure. The patient's chest port a catheter was accessed by the IV team. The patient was placed supine on the fluoroscopy table. A preprocedural spot fluoroscopic image was obtained of the chest in existing port a catheter. The Port a Catheter was noted to easily aspirate. Contrast was injected via the Port a catheter and images were reviewed. The Port a catheter was flushed with a heparin dwell and de accessed. A dressing was placed. The patient tolerated the procedure well without immediate postprocedural complication. FINDINGS: Unchanged positioning of left anterior chest wall subclavian vein approach port a catheter with tip projected over the expected location of the superior cavoatrial junction. Port a Catheter was noted to easily aspirate and flush. Contrast injection demonstrates appropriate positioning and functionality of the Port a catheter. No evidence of a fibrin sheath. No evidence of catheter kinking, fracture or discontinuity. IMPRESSION: Appropriately positioned and functioning port a catheter. Port a catheter is ready  for immediate use. Electronically Signed   By: Sandi Mariscal M.D.   On: 01/14/2017 09:18    ASSESSMENT & PLAN:   66 y.o. male presenting with:   1) Newly diagnosed rt tonsillar Squamous cell carcinoma with cTx cN1 disease patient case was discussed in tumor board and given concern for nerve involvement was thought not to be a good candidate for primary surgery due to concerns for significant post-Sx morbidity. -creatinine WNL. Some concern for decreased hearing and baseline tinnitus due to previous job noise related injury. Baseline audiogram showed normal symmetric low-frequency hearing, dropping off substantially above 2000Hz .   Plan -Started concurrent chemotherapy (Cisplatin weekly) with concurrent RT on 12/30/16.  -Developed mucositis and oral thrush from chemoradiation. He is currently using Magic Mouth wash and Fluconazole to treat. I also suggested clarified butter or olive oil to moisturize his mouth. I prescribed him sucralfate as well.  -I explained he is fine to use pain medication. I prescribed Oxycodone as needed for pain from his mucositis. This has not been helping recently and his pain has started to worsen, I will prescribe long acting morphine to hopefully control his pain better.  -I suggest keeping his feeding tube dry, he can use topical Vitamin A and D ointment.  -Labs show significant neutropenia today, 02/03/17, we will hold his 6h weekly dose of Cisplatin as well until this recovers. Unable to give neulasta 2/t concurrent RT.   -Will continue to get fluids and appropriate electrolyte replacement prior to cisplatin administration and fluids and electrolytes following cisplatin administration -will continue kidney and electrolytes levels with cisplatin therapy -Discussed previously with patient, pt wife regarding importance of maintaining  nutrition during chemotherapy and radiotherapy treatments. They have follow up with Ernestene Kiel in the near future about his nutritional  status.  -repeat Audiogram on 01/28/17 without evidence of new  Changes related to Cisplatin.  -Recent low grade fevers at home, his wife also expresses some concern over recent urinary symptoms. We will send of urine cultures for potential UTI today, this could be the source of his fevers.  (UA and UCx noted to be negative) -Fever counseling was given, I have advised him to seek immediate emergency medical attention if he were to spike high fevers.    Lab for UA/Ucx today Hold chemotherapy today RTC for last dose of Cisplatin chemotherapy in 1 week with labs RTC with Dr Irene Limbo in 2 weeks with labs  All of the patients questions were answered with apparent satisfaction. The patient knows to call the clinic with any problems, questions or concerns.  I spent 20 minutes counseling the patient face to face. The total time spent in the appointment was 25 minutes and more than 50% was on counseling and direct patient cares.  Sullivan Lone MD [place] AAHIVMS [facility] Hematology/Oncology Physician [facility]  (Office):       [phone] (Work cell):  [phone] (Fax):           [phone]  02/03/2017 10:59 AM  This document serves as a record of services personally performed by Sullivan Lone, MD. It was created on his behalf by Reola Mosher, a trained medical scribe. The creation of this record is based on the scribe's personal observations and the provider's statements to them.   .I have reviewed the above documentation for accuracy and completeness, and I agree with the above. Brunetta Genera MD MS

## 2017-02-03 NOTE — Progress Notes (Signed)
Received PA for Morphine sulf 15 mg.  Submitted via Cover My Meds:   [employer] is reviewing your PA request. Typically an electronic response will be received within 72 hours. To check for an update later, open this request from your dashboard. You may close this dialog and return to your dashboard to perform other tasks.

## 2017-02-03 NOTE — Progress Notes (Signed)
Nutrition follow-up could not be completed because chemotherapy was canceled.

## 2017-02-04 ENCOUNTER — Ambulatory Visit: Payer: 59

## 2017-02-04 ENCOUNTER — Ambulatory Visit
Admission: RE | Admit: 2017-02-04 | Discharge: 2017-02-04 | Disposition: A | Discharge status: Home / Self Care | Payer: 59 | Source: Ambulatory Visit | Attending: Radiation Oncology | Admitting: Radiation Oncology

## 2017-02-04 DIAGNOSIS — Z510 Encounter for antineoplastic radiation therapy: Secondary | ICD-10-CM | POA: Diagnosis not present

## 2017-02-04 LAB — URINE CULTURE: Organism ID, Bacteria: NO GROWTH

## 2017-02-05 ENCOUNTER — Ambulatory Visit: Payer: 59

## 2017-02-05 ENCOUNTER — Ambulatory Visit
Admission: RE | Admit: 2017-02-05 | Discharge: 2017-02-05 | Disposition: A | Discharge status: Home / Self Care | Payer: 59 | Source: Ambulatory Visit | Attending: Radiation Oncology | Admitting: Radiation Oncology

## 2017-02-05 DIAGNOSIS — Z510 Encounter for antineoplastic radiation therapy: Secondary | ICD-10-CM | POA: Diagnosis not present

## 2017-02-06 ENCOUNTER — Ambulatory Visit
Admission: RE | Admit: 2017-02-06 | Discharge: 2017-02-06 | Disposition: A | Discharge status: Home / Self Care | Payer: 59 | Source: Ambulatory Visit | Attending: Radiation Oncology | Admitting: Radiation Oncology

## 2017-02-06 ENCOUNTER — Ambulatory Visit: Payer: 59

## 2017-02-06 DIAGNOSIS — Z510 Encounter for antineoplastic radiation therapy: Secondary | ICD-10-CM | POA: Diagnosis not present

## 2017-02-07 ENCOUNTER — Ambulatory Visit
Admission: RE | Admit: 2017-02-07 | Discharge: 2017-02-07 | Disposition: A | Discharge status: Home / Self Care | Payer: 59 | Source: Ambulatory Visit | Attending: Radiation Oncology | Admitting: Radiation Oncology

## 2017-02-07 DIAGNOSIS — Z510 Encounter for antineoplastic radiation therapy: Secondary | ICD-10-CM | POA: Diagnosis not present

## 2017-02-09 ENCOUNTER — Ambulatory Visit
Admission: RE | Admit: 2017-02-09 | Discharge: 2017-02-09 | Disposition: A | Discharge status: Home / Self Care | Payer: 59 | Source: Ambulatory Visit | Attending: Radiation Oncology | Admitting: Radiation Oncology

## 2017-02-09 ENCOUNTER — Ambulatory Visit: Payer: 59

## 2017-02-09 ENCOUNTER — Other Ambulatory Visit: Payer: Self-pay

## 2017-02-09 ENCOUNTER — Ambulatory Visit: Payer: 59 | Attending: Radiation Oncology

## 2017-02-09 ENCOUNTER — Encounter: Payer: Self-pay | Admitting: Hematology

## 2017-02-09 DIAGNOSIS — Z510 Encounter for antineoplastic radiation therapy: Secondary | ICD-10-CM | POA: Diagnosis not present

## 2017-02-09 DIAGNOSIS — R1310 Dysphagia, unspecified: Secondary | ICD-10-CM | POA: Diagnosis not present

## 2017-02-09 DIAGNOSIS — C090 Malignant neoplasm of tonsillar fossa: Secondary | ICD-10-CM

## 2017-02-09 MED FILL — ONDANSETRON HCL 8 MG TAB: 8 | 15 days supply | Qty: 30 | Fill #1

## 2017-02-09 NOTE — Therapy (Signed)
[facility] [address] [place], [place], [address] Phone: [phone]   Fax:  [phone]  Speech Language Pathology Treatment  Patient Details  Name: Terry Pacheco MRN: [id number] Date of Birth: [birth year] Referring Provider: Eppie Gibson, MD   Encounter Date: 02/09/2017  End of Session - 02/09/17 0923    Visit Number  2    Number of Visits  3    Date for SLP Re-Evaluation  03/13/17    SLP Start Time  0853    SLP Stop Time   0923    SLP Time Calculation (min)  30 min    Activity Tolerance  Patient tolerated treatment well       Past Medical History:  Diagnosis Date  . Arthritis   . GERD (gastroesophageal reflux disease) 07/06/2014  . Hypertension   . Sleep apnea    does not use CPAP  . Wears glasses     Past Surgical History:  Procedure Laterality Date  . HERNIA REPAIR    . IR CV LINE INJECTION  01/14/2017  . IR FLUORO GUIDE PORT INSERTION RIGHT  12/25/2016  . IR GASTROSTOMY TUBE MOD SED  12/25/2016  . IR US GUIDE VASC ACCESS RIGHT  12/25/2016  . polyp removal  2008   during colonoscopy  . TOTAL HIP ARTHROPLASTY Right 10/16/2015   Procedure: RIGHT TOTAL HIP ARTHROPLASTY ANTERIOR APPROACH;  Surgeon: Paralee Cancel, MD;  Location: [facility];  Service: Orthopedics;  Laterality: Right;  . vocal cord polyp      removed 2-3 years ago     There were no vitals filed for this visit.  Subjective Assessment - 02/09/17 0859    Subjective  Pt drinking protein shakes, takes one can a day via tube.     Patient is accompained by:  Family member wife    Currently in Pain?  No/denies only when swallowing            ADULT SLP TREATMENT - 02/09/17 0903      General Information   Behavior/Cognition  Alert;Cooperative;Pleasant mood      Treatment Provided   Treatment provided  Dysphagia      Dysphagia Treatment   Temperature Spikes Noted  No    Respiratory Status  Room air    Treatment Methods  Skilled  observation;Therapeutic exercise;Patient/caregiver education    Patient observed directly with PO's  Yes    Type of PO's observed  Thin liquids    Oral Phase Signs & Symptoms  -- none noted    Pharyngeal Phase Signs & Symptoms  -- none noted    Other treatment/comments  Pt told SLP why he is completing exercises with independence. He has been compliant with reps and frequency. Pt req'd min A with breath hold exercise. All other procedure for HEP was WNL. Pt told SLP 3 s/s aspiration PNA with modified independence.      Assessment / Recommendations / Plan   Plan  Continue with current plan of care      Dysphagia Recommendations   Diet recommendations  -- diet as tolerated/thin liquids    Medication Administration  -- as tolerated      Progression Toward Goals   Progression toward goals  Progressing toward goals       SLP Education - 02/09/17 0922    Education provided  Yes    Education Details  breath hold procedure, begin solid POs with items he ate just prior to protein shakes POs, overt s/s  aspiraiton PNA    Person(s) Educated  Patient;Spouse    Methods  Explanation;Handout;Verbal cues    Comprehension  Verbalized understanding;Returned demonstration       SLP Short Term Goals - 02/09/17 0925      SLP SHORT TERM GOAL #1   Title  pt will complete HEP with rare min A     Status  Achieved      SLP SHORT TERM GOAL #2   Title  pt will tell SLP why he is completing HEP     Status  Achieved      SLP SHORT TERM GOAL #3   Title  pt will tell SLP 3 overt s/s aspiration PNA with modified independence    Status  Achieved       SLP Long Term Goals - 02/09/17 0925      SLP LONG TERM GOAL #1   Title  pt will tell SLP why food journal can be helpful in returning to least restrictive food consistencies    Time  1    Period  -- vistis    Status  On-going      SLP LONG TERM GOAL #2   Title  pt will complete HEP with modified independence    Time  1    Period  -- visits     Status  On-going      SLP LONG TERM GOAL #3   Title  pt will tell SLP when HEP frequency can be reduced to x2-3/week    Time  1    Period  -- visits    Status  On-going       Plan - 02/09/17 0924    Clinical Impression Statement  Pt with oropharyngeal swallowing essentially WNL, however the probability of swallowing difficulty continues during and after chemo and radiation therapy. Pt will need to be followed by SLP for regular assessment of accurate HEP completion as well as for safety with POs both during and following treatment/s.    Speech Therapy Frequency  -- approx once every 4 weeks    Duration  -- 3 total visits    Treatment/Interventions  Aspiration precaution training;Pharyngeal strengthening exercises;Diet toleration management by SLP;Environmental controls;Compensatory techniques;Cueing hierarchy;SLP instruction and feedback;Multimodal communcation approach;Internal/external aids;Trials of upgraded texture/liquids;Patient/family education any or all will be used    Potential to Achieve Goals  Good    SLP Home Exercise Plan  provided today    Consulted and Agree with Plan of Care  Patient       Patient will benefit from skilled therapeutic intervention in order to improve the following deficits and impairments:   Dysphagia, unspecified type    Problem List Patient Active Problem List   Diagnosis Date Noted  . Counseling regarding advanced care planning and goals of care 12/22/2016  . 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 Osteoarthritis of right hip 04/05/2015  . GAD (generalized anxiety disorder) 04/05/2015  . Metabolic syndrome 44/81/8563  . Obese 04/05/2015  . GERD (gastroesophageal reflux disease) 07/06/2014  . Hypertension 03/14/2013  . Hyperlipidemia with target LDL less than 100 03/14/2013    SCHINKE,CARL ,MS, CCC-SLP  02/09/2017, 9:26 AM  [facility] [address] [place], [place], [address] Phone: [phone]   Fax:  [phone]   Name: Terry Pacheco MRN: [id number] Date of Birth: [birth year]

## 2017-02-09 NOTE — Progress Notes (Signed)
Received PA determination from [facility] for Morphine Sulfate.  PA approved 02/03/17- 02/04/19.

## 2017-02-10 ENCOUNTER — Ambulatory Visit (HOSPITAL_BASED_OUTPATIENT_CLINIC_OR_DEPARTMENT_OTHER): Payer: 59

## 2017-02-10 ENCOUNTER — Other Ambulatory Visit: Payer: Self-pay | Admitting: Medical

## 2017-02-10 ENCOUNTER — Other Ambulatory Visit (HOSPITAL_BASED_OUTPATIENT_CLINIC_OR_DEPARTMENT_OTHER): Payer: 59

## 2017-02-10 ENCOUNTER — Ambulatory Visit: Payer: 59

## 2017-02-10 ENCOUNTER — Ambulatory Visit
Admission: RE | Admit: 2017-02-10 | Discharge: 2017-02-10 | Disposition: A | Discharge status: Home / Self Care | Payer: 59 | Source: Ambulatory Visit | Attending: Radiation Oncology | Admitting: Radiation Oncology

## 2017-02-10 ENCOUNTER — Ambulatory Visit: Payer: 59 | Admitting: Nutrition

## 2017-02-10 DIAGNOSIS — Z510 Encounter for antineoplastic radiation therapy: Secondary | ICD-10-CM | POA: Diagnosis not present

## 2017-02-10 DIAGNOSIS — Z5111 Encounter for antineoplastic chemotherapy: Secondary | ICD-10-CM

## 2017-02-10 DIAGNOSIS — R609 Edema, unspecified: Secondary | ICD-10-CM

## 2017-02-10 DIAGNOSIS — C090 Malignant neoplasm of tonsillar fossa: Secondary | ICD-10-CM | POA: Diagnosis not present

## 2017-02-10 DIAGNOSIS — Z7189 Other specified counseling: Secondary | ICD-10-CM

## 2017-02-10 LAB — COMPREHENSIVE METABOLIC PANEL
ALT: 23 U/L (ref 0–55)
AST: 18 U/L (ref 5–34)
Albumin: 3.4 g/dL — ABNORMAL LOW (ref 3.5–5.0)
Alkaline Phosphatase: 121 U/L (ref 40–150)
Anion Gap: 8 mEq/L (ref 3–11)
BUN: 14.9 mg/dL (ref 7.0–26.0)
CALCIUM: 9.2 mg/dL (ref 8.4–10.4)
CHLORIDE: 98 meq/L (ref 98–109)
CO2: 27 mEq/L (ref 22–29)
CREATININE: 1 mg/dL (ref 0.7–1.3)
EGFR: >60 mL/min/{1.73_m2} (ref >60)
Glucose: 106 mg/dl (ref 70–140)
Potassium: 4.7 mEq/L (ref 3.5–5.1)
Sodium: 134 mEq/L — ABNORMAL LOW (ref 136–145)
TOTAL PROTEIN: 6.7 g/dL (ref 6.4–8.3)
Total Bilirubin: 0.6 mg/dL (ref 0.20–1.20)

## 2017-02-10 LAB — CBC WITH DIFFERENTIAL/PLATELET
BASO%: 0.2 % (ref 0.0–2.0)
Basophils Absolute: 0 10*3/uL (ref 0.0–0.1)
EOS%: 0.4 % (ref 0.0–7.0)
Eosinophils Absolute: 0 10*3/uL (ref 0.0–0.5)
HEMATOCRIT: 34.2 % — AB (ref 38.4–49.9)
HEMOGLOBIN: 11.8 g/dL — AB (ref 13.0–17.1)
LYMPH#: 0.7 10*3/uL — AB (ref 0.9–3.3)
LYMPH%: 13.6 % — ABNORMAL LOW (ref 14.0–49.0)
MCH: 31.4 pg (ref 27.2–33.4)
MCHC: 34.5 g/dL (ref 32.0–36.0)
MCV: 91 fL (ref 79.3–98.0)
MONO#: 1 10*3/uL — AB (ref 0.1–0.9)
MONO%: 19.7 % — ABNORMAL HIGH (ref 0.0–14.0)
NEUT%: 66.1 % (ref 39.0–75.0)
NEUTROS ABS: 3.5 10*3/uL (ref 1.5–6.5)
PLATELETS: 362 10*3/uL (ref 140–400)
RBC: 3.75 10*6/uL — ABNORMAL LOW (ref 4.20–5.82)
RDW: 16.7 % — AB (ref 11.0–14.6)
WBC: 5.3 10*3/uL (ref 4.0–10.3)

## 2017-02-10 MED ORDER — SODIUM CHLORIDE 0.9% FLUSH
10.0000 mL | INTRAVENOUS | Status: DC | PRN
  Administered 2017-02-10: 10 mL
  Filled 2017-02-10: qty 10

## 2017-02-10 MED ORDER — PALONOSETRON HCL INJECTION 0.25 MG/5ML
0.2500 mg | Freq: Once | INTRAVENOUS | Status: AC
  Administered 2017-02-10: 0.25 mg via INTRAVENOUS

## 2017-02-10 MED ORDER — POTASSIUM CHLORIDE 2 MEQ/ML IV SOLN
Freq: Once | INTRAVENOUS | Status: AC
  Administered 2017-02-10: 11:00:00 via INTRAVENOUS
  Filled 2017-02-10: qty 10

## 2017-02-10 MED ORDER — HEPARIN SOD (PORK) LOCK FLUSH 100 UNIT/ML IV SOLN
500.0000 [IU] | Freq: Once | INTRAVENOUS | Status: AC | PRN
  Administered 2017-02-10: 500 [IU]
  Filled 2017-02-10: qty 5

## 2017-02-10 MED ORDER — PALONOSETRON HCL INJECTION 0.25 MG/5ML
INTRAVENOUS | Status: AC
  Filled 2017-02-10: qty 5

## 2017-02-10 MED ORDER — SODIUM CHLORIDE 0.9 % IV SOLN
40.0000 mg/m2 | Freq: Once | INTRAVENOUS | Status: AC
  Administered 2017-02-10: 86 mg via INTRAVENOUS
  Filled 2017-02-10: qty 86

## 2017-02-10 MED ORDER — SODIUM CHLORIDE 0.9 % IV SOLN
Freq: Once | INTRAVENOUS | Status: AC
  Administered 2017-02-10: 10:00:00 via INTRAVENOUS

## 2017-02-10 MED ORDER — FOSAPREPITANT DIMEGLUMINE INJECTION 150 MG
Freq: Once | INTRAVENOUS | Status: AC
  Administered 2017-02-10: 14:00:00 via INTRAVENOUS
  Filled 2017-02-10: qty 5

## 2017-02-10 NOTE — Progress Notes (Signed)
Patient receiving pre fluids for hydration and complained of pain in right neck.  Upon observation there is puffiness in right subclavian just below radiation site.  Flushed with saline and no complaint of pain and blood return obtained.  Sandi Mealy PA-C in to evaluate.  Dye study ordered.  When deaccessing site during heparin flush patient complained of pain in subclavian area.

## 2017-02-10 NOTE — Patient Instructions (Signed)
Cisplatin injection  What is this medicine?  CISPLATIN (SIS pla tin) is a chemotherapy drug. It targets fast dividing cells, like cancer cells, and causes these cells to die. This medicine is used to treat many types of cancer like bladder, ovarian, and testicular cancers.  This medicine may be used for other purposes; ask your health care provider or pharmacist if you have questions.  COMMON BRAND NAME(S): Platinol, Platinol -AQ  What should I tell my health care provider before I take this medicine?  They need to know if you have any of these conditions:  -blood disorders  -hearing problems  -kidney disease  -recent or ongoing radiation therapy  -an unusual or allergic reaction to cisplatin, carboplatin, other chemotherapy, other medicines, foods, dyes, or preservatives  -pregnant or trying to get pregnant  -breast-feeding  How should I use this medicine?  This drug is given as an infusion into a vein. It is administered in a hospital or clinic by a specially trained health care professional.  Talk to your pediatrician regarding the use of this medicine in children. Special care may be needed.  Overdosage: If you think you have taken too much of this medicine contact a poison control center or emergency room at once.  NOTE: This medicine is only for you. Do not share this medicine with others.  What if I miss a dose?  It is important not to miss a dose. Call your doctor or health care professional if you are unable to keep an appointment.  What may interact with this medicine?  -dofetilide  -foscarnet  -medicines for seizures  -medicines to increase blood counts like filgrastim, pegfilgrastim, sargramostim  -probenecid  -pyridoxine used with altretamine  -rituximab  -some antibiotics like amikacin, gentamicin, neomycin, polymyxin B, streptomycin, tobramycin  -sulfinpyrazone  -vaccines  -zalcitabine  Talk to your doctor or health care professional before taking any of these  medicines:  -acetaminophen  -aspirin  -ibuprofen  -ketoprofen  -naproxen  This list may not describe all possible interactions. Give your health care provider a list of all the medicines, herbs, non-prescription drugs, or dietary supplements you use. Also tell them if you smoke, drink alcohol, or use illegal drugs. Some items may interact with your medicine.  What should I watch for while using this medicine?  Your condition will be monitored carefully while you are receiving this medicine. You will need important blood work done while you are taking this medicine.  This drug may make you feel generally unwell. This is not uncommon, as chemotherapy can affect healthy cells as well as cancer cells. Report any side effects. Continue your course of treatment even though you feel ill unless your doctor tells you to stop.  In some cases, you may be given additional medicines to help with side effects. Follow all directions for their use.  Call your doctor or health care professional for advice if you get a fever, chills or sore throat, or other symptoms of a cold or flu. Do not treat yourself. This drug decreases your body's ability to fight infections. Try to avoid being around people who are sick.  This medicine may increase your risk to bruise or bleed. Call your doctor or health care professional if you notice any unusual bleeding.  Be careful brushing and flossing your teeth or using a toothpick because you may get an infection or bleed more easily. If you have any dental work done, tell your dentist you are receiving this medicine.  Avoid taking products   that contain aspirin, acetaminophen, ibuprofen, naproxen, or ketoprofen unless instructed by your doctor. These medicines may hide a fever.  Do not become pregnant while taking this medicine. Women should inform their doctor if they wish to become pregnant or think they might be pregnant. There is a potential for serious side effects to an unborn child. Talk to  your health care professional or pharmacist for more information. Do not breast-feed an infant while taking this medicine.  Drink fluids as directed while you are taking this medicine. This will help protect your kidneys.  Call your doctor or health care professional if you get diarrhea. Do not treat yourself.  What side effects may I notice from receiving this medicine?  Side effects that you should report to your doctor or health care professional as soon as possible:  -allergic reactions like skin rash, itching or hives, swelling of the face, lips, or tongue  -signs of infection - fever or chills, cough, sore throat, pain or difficulty passing urine  -signs of decreased platelets or bleeding - bruising, pinpoint red spots on the skin, black, tarry stools, nosebleeds  -signs of decreased red blood cells - unusually weak or tired, fainting spells, lightheadedness  -breathing problems  -changes in hearing  -gout pain  -low blood counts - This drug may decrease the number of white blood cells, red blood cells and platelets. You may be at increased risk for infections and bleeding.  -nausea and vomiting  -pain, swelling, redness or irritation at the injection site  -pain, tingling, numbness in the hands or feet  -problems with balance, movement  -trouble passing urine or change in the amount of urine  Side effects that usually do not require medical attention (report to your doctor or health care professional if they continue or are bothersome):  -changes in vision  -loss of appetite  -metallic taste in the mouth or changes in taste  This list may not describe all possible side effects. Call your doctor for medical advice about side effects. You may report side effects to FDA at 1-800-FDA-1088.  Where should I keep my medicine?  This drug is given in a hospital or clinic and will not be stored at home.  NOTE: This sheet is a summary. It may not cover all possible information. If you have questions about this medicine,  talk to your doctor, pharmacist, or health care provider.   2018 Elsevier/Gold Standard (2007-05-18 14:40:54)

## 2017-02-11 ENCOUNTER — Ambulatory Visit: Payer: 59

## 2017-02-11 ENCOUNTER — Ambulatory Visit
Admission: RE | Admit: 2017-02-11 | Discharge: 2017-02-11 | Disposition: A | Discharge status: Home / Self Care | Payer: 59 | Source: Ambulatory Visit | Attending: Radiation Oncology | Admitting: Radiation Oncology

## 2017-02-11 ENCOUNTER — Telehealth: Payer: Self-pay

## 2017-02-11 DIAGNOSIS — Z510 Encounter for antineoplastic radiation therapy: Secondary | ICD-10-CM | POA: Diagnosis not present

## 2017-02-11 MED FILL — CMPD MMW LID/MAALOX/DIPHEN: 15 days supply | Qty: 300 | Fill #1

## 2017-02-11 MED FILL — LIDOCAINE 2% VISCOUS SOLN: 2 | 3 days supply | Qty: 100 | Fill #5

## 2017-02-11 NOTE — Telephone Encounter (Signed)
Pt wife, Juliann Pulse, called today requesting follow up to determine status of port removal. Pt has had trouble with port during past two visits. Port use resulted in saline infiltration in right shoulder. Peripheral IV was used alternatively. Scheduling message sent to have lab and doctor visit scheduled in early January. Dr. Irene Limbo would like to confirm that pt is not neutropenic before scheduling port removal. Pt or wife to notify if the area becomes painful, red, or irritated. Pt can be seen in symptom management if any of these side effects occur. Pt wife verbalized understanding, and knows to expect a call from scheduling regarding appointments in early January.

## 2017-02-11 NOTE — Progress Notes (Signed)
Nutrition follow-up completed with patient and wife, during chemotherapy.  Patient is being treated for tonsil cancer. Current weight documented as 200.5 pounds, December 11. Patient states he has not really been eating much food by mouth. He is drinking Ensure Plus and Osmolite 1.5 without difficulty. He reports he consumes 5 bottles daily. He is drinking water by mouth as well.  Estimated nutrition needs: 2200-2400 calories, 115-125 grams protein, 2.4 L fluid.  Nutrition diagnosis: Inadequate oral intake, ongoing.  Intervention: Patient educated to continue oral nutrition supplement by mouth as tolerated. Patient educated to continue to flush feeding tube with water daily. Recommended patient begin soft or pured foods by mouth at mealtimes. Reviewed food choices. Questions were answered.  Teach back method used.  Monitoring, evaluation, goals: Patient will work to increase oral intake to minimize further weight loss and promote healing.  Next visit: To be scheduled as needed.  Patient to call me with any questions or concerns.  **Disclaimer: This note was dictated with voice recognition software. Similar sounding words can inadvertently be transcribed and this note may contain transcription errors which may not have been corrected upon publication of note.**

## 2017-02-12 ENCOUNTER — Ambulatory Visit: Payer: 59

## 2017-02-12 ENCOUNTER — Ambulatory Visit
Admission: RE | Admit: 2017-02-12 | Discharge: 2017-02-12 | Disposition: A | Discharge status: Home / Self Care | Payer: 59 | Source: Ambulatory Visit | Attending: Radiation Oncology | Admitting: Radiation Oncology

## 2017-02-12 DIAGNOSIS — Z510 Encounter for antineoplastic radiation therapy: Secondary | ICD-10-CM | POA: Diagnosis not present

## 2017-02-13 ENCOUNTER — Ambulatory Visit: Payer: 59

## 2017-02-13 ENCOUNTER — Ambulatory Visit
Admission: RE | Admit: 2017-02-13 | Discharge: 2017-02-13 | Disposition: A | Discharge status: Home / Self Care | Payer: 59 | Source: Ambulatory Visit | Attending: Radiation Oncology | Admitting: Radiation Oncology

## 2017-02-13 ENCOUNTER — Telehealth: Payer: Self-pay | Admitting: Hematology

## 2017-02-13 DIAGNOSIS — Z510 Encounter for antineoplastic radiation therapy: Secondary | ICD-10-CM | POA: Diagnosis not present

## 2017-02-13 NOTE — Telephone Encounter (Signed)
Scheduled appt per 12/19 sch message - patients wife is aware of appt date and time.

## 2017-02-16 ENCOUNTER — Ambulatory Visit
Admission: RE | Admit: 2017-02-16 | Discharge: 2017-02-16 | Disposition: A | Discharge status: Home / Self Care | Payer: 59 | Source: Ambulatory Visit | Attending: Radiation Oncology | Admitting: Radiation Oncology

## 2017-02-16 ENCOUNTER — Other Ambulatory Visit: Payer: Self-pay | Admitting: Radiation Oncology

## 2017-02-16 ENCOUNTER — Ambulatory Visit: Payer: 59

## 2017-02-16 DIAGNOSIS — Z510 Encounter for antineoplastic radiation therapy: Secondary | ICD-10-CM | POA: Diagnosis not present

## 2017-02-16 DIAGNOSIS — C090 Malignant neoplasm of tonsillar fossa: Secondary | ICD-10-CM

## 2017-02-16 MED ORDER — SONAFINE EX EMUL: 1.0000 "application " | Freq: Two times a day (BID) | CUTANEOUS | Status: DC

## 2017-02-16 MED ORDER — FLUCONAZOLE 100 MG PO TABS: ORAL_TABLET | ORAL | 0 refills | Status: DC

## 2017-02-16 MED FILL — FLUCONAZOLE 100 MG TABLET: 100 | 14 days supply | Qty: 15 | Fill #0

## 2017-02-18 ENCOUNTER — Ambulatory Visit: Payer: 59

## 2017-02-18 ENCOUNTER — Encounter: Payer: Self-pay | Admitting: Radiation Oncology

## 2017-02-18 ENCOUNTER — Ambulatory Visit
Admission: RE | Admit: 2017-02-18 | Discharge: 2017-02-18 | Disposition: A | Discharge status: Home / Self Care | Payer: 59 | Source: Ambulatory Visit | Attending: Radiation Oncology | Admitting: Radiation Oncology

## 2017-02-18 DIAGNOSIS — Z510 Encounter for antineoplastic radiation therapy: Secondary | ICD-10-CM | POA: Diagnosis not present

## 2017-02-19 ENCOUNTER — Ambulatory Visit: Payer: 59

## 2017-02-19 ENCOUNTER — Other Ambulatory Visit: Payer: Self-pay | Admitting: Hematology

## 2017-02-19 ENCOUNTER — Other Ambulatory Visit: Payer: Self-pay | Admitting: Radiation Oncology

## 2017-02-19 ENCOUNTER — Telehealth: Payer: Self-pay | Admitting: *Deleted

## 2017-02-19 ENCOUNTER — Other Ambulatory Visit: Payer: Self-pay | Admitting: *Deleted

## 2017-02-19 DIAGNOSIS — C099 Malignant neoplasm of tonsil, unspecified: Secondary | ICD-10-CM

## 2017-02-19 DIAGNOSIS — C090 Malignant neoplasm of tonsillar fossa: Secondary | ICD-10-CM

## 2017-02-19 MED FILL — CARAFATE 1 GM/10 ML SUSP: 1 | 10 days supply | Qty: 420 | Fill #1

## 2017-02-19 NOTE — Telephone Encounter (Addendum)
Received vm call from pt's wife via triage dept stating pt ran fever last hs 100.1, congested, & feels bad.  Wife is requesting something for congestion.  Returned call & spoke with wife & pt had last chemo 02/11/17  & finished XRT yest.  He has had a sore throat from XRT & was eating some but as of yest is only doing tube feedings. She states that he was gagging some yest & got choked several times yest.  He has a non-productive cough.  Dr Isidore Moos gave his script for diflucan yest.  He is taking nausea med bid. She states pt does not want to come in & states he is OK.  Discussed with Sandi Mealy PA & option given to come in this pm or tomorrow with CXR & lab & see Symptom Management.  Wife discussed with pt & he will come in tomorrow.  Message sent to schedulers for CXR, Lab, & Symptom Management @ 11 tomorrow.

## 2017-02-20 ENCOUNTER — Other Ambulatory Visit (HOSPITAL_BASED_OUTPATIENT_CLINIC_OR_DEPARTMENT_OTHER): Payer: 59

## 2017-02-20 ENCOUNTER — Ambulatory Visit (HOSPITAL_BASED_OUTPATIENT_CLINIC_OR_DEPARTMENT_OTHER): Payer: 59 | Admitting: Medical

## 2017-02-20 ENCOUNTER — Ambulatory Visit: Payer: 59

## 2017-02-20 ENCOUNTER — Ambulatory Visit (HOSPITAL_COMMUNITY)
Admission: RE | Admit: 2017-02-20 | Discharge: 2017-02-20 | Disposition: A | Discharge status: Home / Self Care | Payer: 59 | Source: Ambulatory Visit | Attending: Medical | Admitting: Medical

## 2017-02-20 VITALS — BP 125/70 | HR 91 | Temp 99.2°F | Resp 18 | Ht 69.0 in | Wt 196.1 lb

## 2017-02-20 DIAGNOSIS — R509 Fever, unspecified: Secondary | ICD-10-CM | POA: Diagnosis not present

## 2017-02-20 DIAGNOSIS — K209 Esophagitis, unspecified without bleeding: Secondary | ICD-10-CM

## 2017-02-20 DIAGNOSIS — C090 Malignant neoplasm of tonsillar fossa: Secondary | ICD-10-CM

## 2017-02-20 DIAGNOSIS — B370 Candidal stomatitis: Secondary | ICD-10-CM | POA: Diagnosis not present

## 2017-02-20 DIAGNOSIS — C099 Malignant neoplasm of tonsil, unspecified: Secondary | ICD-10-CM

## 2017-02-20 DIAGNOSIS — J0100 Acute maxillary sinusitis, unspecified: Secondary | ICD-10-CM | POA: Diagnosis not present

## 2017-02-20 DIAGNOSIS — Z931 Gastrostomy status: Secondary | ICD-10-CM | POA: Diagnosis not present

## 2017-02-20 LAB — CBC WITH DIFFERENTIAL/PLATELET
BASO%: 0.3 % (ref 0.0–2.0)
BASOS ABS: 0 10*3/uL (ref 0.0–0.1)
EOS ABS: 0 10*3/uL (ref 0.0–0.5)
EOS%: 0.5 % (ref 0.0–7.0)
HCT: 34.6 % — ABNORMAL LOW (ref 38.4–49.9)
HGB: 11.6 g/dL — ABNORMAL LOW (ref 13.0–17.1)
LYMPH%: 4.4 % — AB (ref 14.0–49.0)
MCH: 30.6 pg (ref 27.2–33.4)
MCHC: 33.4 g/dL (ref 32.0–36.0)
MCV: 91.7 fL (ref 79.3–98.0)
MONO#: 1.5 10*3/uL — AB (ref 0.1–0.9)
MONO%: 18.7 % — AB (ref 0.0–14.0)
NEUT%: 76.1 % — AB (ref 39.0–75.0)
NEUTROS ABS: 6.2 10*3/uL (ref 1.5–6.5)
PLATELETS: 254 10*3/uL (ref 140–400)
RBC: 3.78 10*6/uL — AB (ref 4.20–5.82)
RDW: 17.1 % — ABNORMAL HIGH (ref 11.0–14.6)
WBC: 8.2 10*3/uL (ref 4.0–10.3)
lymph#: 0.4 10*3/uL — ABNORMAL LOW (ref 0.9–3.3)

## 2017-02-20 MED ORDER — MAGIC MOUTHWASH W/LIDOCAINE: 5.0000 mL | Freq: Four times a day (QID) | ORAL | 1 refills | Status: DC | PRN

## 2017-02-20 MED ORDER — LIDOCAINE VISCOUS 2 % MT SOLN: OROMUCOSAL | 5 refills | Status: DC

## 2017-02-20 MED ORDER — OXYCODONE HCL 5 MG PO TABS: 5.0000 mg | ORAL_TABLET | ORAL | 0 refills | Status: AC | PRN

## 2017-02-20 MED ORDER — AMOXICILLIN-POT CLAVULANATE 875-125 MG PO TABS: 1.0000 | ORAL_TABLET | Freq: Two times a day (BID) | ORAL | 0 refills | Status: DC

## 2017-02-20 MED ORDER — OXYCODONE HCL 5 MG PO TABS: 5.0000 mg | ORAL_TABLET | ORAL | 0 refills | Status: DC | PRN

## 2017-02-20 MED FILL — AMOX TR-K CLV 875-125 MG TA: 875-125 | 10 days supply | Qty: 20 | Fill #0

## 2017-02-20 MED FILL — oxyCODONE HCL 5 MG TABS: 5 | 5 days supply | Qty: 60 | Fill #0

## 2017-02-20 MED FILL — LIDOCAINE 2% VISCOUS SOLN: 2 | 5 days supply | Qty: 100 | Fill #0

## 2017-02-20 NOTE — Progress Notes (Signed)
[facility]pt[facility] Management Clinic Progress Note   Terry Pacheco [id number] 1950/05/12 66 y.o.  Terry Pacheco is managed by Dr. Sullivan Lone  Actively treated with chemotherapy: yes  Current Therapy: Cisplatin with concurrent radiation therapy  Last Treated: 02/18/2017  Assessment: Plan:    Acute non-recurrent maxillary sinusitis - Plan: amoxicillin-clavulanate (AUGMENTIN) 875-125 MG tablet  Carcinoma of tonsillar fossa ([facility]) - Plan: lidocaine (XYLOCAINE) 2 % solution  Fever, unspecified fever cause - Plan: amoxicillin-clavulanate (AUGMENTIN) 875-125 MG tablet  Esophagitis - Plan: magic mouthwash w/lidocaine SOLN, oxyCODONE (OXY IR/ROXICODONE) 5 MG immediate release tablet  Oral candidiasis   Fever with maxillary sinusitis: The patient was given a prescription for Augmentin 875-125 p.o. twice daily times 10 days.  Esophagitis: Patient was given a prescription for Magic mouthwash, viscous lidocaine, and Roxicodone 5 mg.  Carcinoma of the tonsillar fossa: The patient has completed cis-platinum with concurrent radiation therapy on 02/18/2017. He has a follow-up appointment with Dr. Sullivan Lone in 2 weeks.  Oral candidiasis: Continue Diflucan as prescribed.  Please see After Visit Summary for patient specific instructions.  Future Appointments  Date Time Provider [place]  03/03/2017  8:30 AM [facility] 3 [facility] None  03/03/2017  9:00 AM Brunetta Genera, MD [facility] None  03/16/2017 10:15 AM Sharen Counter, CCC-SLP [facility]  03/24/2017  2:00 PM Eppie Gibson, MD [facility] None    No orders of the defined types were placed in this encounter.      Subjective:   Patient ID:  Terry Pacheco is a 66 y.o. (DOB 1951/02/13) male.  Chief Complaint: No chief complaint on file.   HPI Terry Pacheco is a 66 year old male with a history of a squamous cell carcinoma of the tonsil.  He was treated with cisplatin and concurrent radiation therapy which he  completed on 02/18/2017.  The patient's wife states that the patient had a fever of 100.7 on the evening of 02/18/2017.  Additionally, he was not feeling well and had congestion, and sore throat.  He had been eating but was only able to do tube feedings on 02/18/2017.  He is having a nonproductive cough, sore throat, difficulty swallowing, postnasal drainage, and generalized weakness.    He has been taking medications for nausea.  Additionally he was told by Dr. Isidore Moos that he possibly had oral candidiasis.  He was begun on Diflucan.  A chest x-ray was completed this morning along with labs.  Chest x-ray was negative for pneumonia.  His labs are stable.  Medications: I have reviewed the patient's current medications.  Allergies:  Allergies  Allergen Reactions  . Crestor [Rosuvastatin]     Muscle aches    Past Medical History:  Diagnosis Date  . Arthritis   . GERD (gastroesophageal reflux disease) 07/06/2014  . Hypertension   . Sleep apnea    does not use CPAP  . Wears glasses     Past Surgical History:  Procedure Laterality Date  . HERNIA REPAIR    . IR CV LINE INJECTION  01/14/2017  . IR FLUORO GUIDE PORT INSERTION RIGHT  12/25/2016  . IR GASTROSTOMY TUBE MOD SED  12/25/2016  . IR US GUIDE VASC ACCESS RIGHT  12/25/2016  . polyp removal  2008   during colonoscopy  . TOTAL HIP ARTHROPLASTY Right 10/16/2015   Procedure: RIGHT TOTAL HIP ARTHROPLASTY ANTERIOR APPROACH;  Surgeon: Paralee Cancel, MD;  Location: [facility];  Service: Orthopedics;  Laterality: Right;  . vocal cord polyp  removed 2-3 years ago     Family History  Problem Relation Age of Onset  . Heart disease Mother   . Stroke Father     Social History   Socioeconomic History  . Marital status: Married    Spouse name: Not on file  . Number of children: 1  . Years of education: Not on file  . Highest education level: Not on file  Social Needs  . Financial resource strain: Not on file  . Food insecurity - worry: Not  on file  . Food insecurity - inability: Not on file  . Transportation needs - medical: Not on file  . Transportation needs - non-medical: Not on file  Occupational History  . Occupation: Retired     Fish farm manager: [employer]  Tobacco Use  . Smoking status: Former Smoker    Packs/day: 0.50    Years: 45.00    Pack years: 22.50    Types: Cigarettes    Start date: 01/12/1983    Last attempt to quit: 11/17/2016    Years since quitting: 0.2  . Smokeless tobacco: Never Used  Substance and Sexual Activity  . Alcohol use: Yes    Alcohol/week: 0.0 oz    Comment: rare  . Drug use: Yes    Types: Marijuana    Comment: last use 10/09/2015  . Sexual activity: Not on file  Other Topics Concern  . Not on file  Social History Narrative  . Not on file    Past Medical History, Surgical history, Social history, and Family history were reviewed and updated as appropriate.   Please see review of systems for further details on the patient's review from today.   Review of Systems:  Review of Systems  Constitutional: Positive for appetite change, fatigue and fever. Negative for chills and diaphoresis.  HENT: Positive for mouth sores, postnasal drip, sinus pressure, sinus pain, sore throat and trouble swallowing.   Respiratory: Positive for cough. Negative for chest tightness and shortness of breath.   Cardiovascular: Negative for chest pain.    Objective:   Physical Exam:  BP 125/70 (BP Location: Left Arm, Patient Position: Sitting)   Pulse 91   Temp 99.2 F (37.3 C) (Oral)   Resp 18   Ht 5\' 9"  (1.753 m)   Wt 196 lb 1.6 oz (89 kg)   SpO2 98%   BMI 28.96 kg/m  ECOG: 1   Physical Exam  Constitutional: No distress.  HENT:  Head: Normocephalic and atraumatic.  Nose: Right sinus exhibits maxillary sinus tenderness. Right sinus exhibits no frontal sinus tenderness. Left sinus exhibits maxillary sinus tenderness. Left sinus exhibits no frontal sinus tenderness.  Plaquing of the  oropharynx consistent with oral candidiasis.  Neck: Normal range of motion. Neck supple.  Cardiovascular: Normal rate, regular rhythm and normal heart sounds. Exam reveals no gallop and no friction rub.  No murmur heard. Pulmonary/Chest: Effort normal and breath sounds normal. No respiratory distress. He has no wheezes. He has no rales.  Lymphadenopathy:    He has no cervical adenopathy.  Neurological: He is alert. Coordination normal.  Skin: Skin is warm and dry. No rash noted. He is not diaphoretic. No erythema.    Lab Review:     Component Value Date/Time   NA 134 (L) 02/10/2017 0903   K 4.7 02/10/2017 0903   CL 99 04/16/2016 1139   CO2 27 02/10/2017 0903   GLUCOSE 106 02/10/2017 0903   BUN 14.9 02/10/2017 0903   CREATININE 1.0 02/10/2017  4270   CALCIUM 9.2 02/10/2017 0903   PROT 6.7 02/10/2017 0903   ALBUMIN 3.4 (L) 02/10/2017 0903   AST 18 02/10/2017 0903   ALT 23 02/10/2017 0903   ALKPHOS 121 02/10/2017 0903   BILITOT 0.60 02/10/2017 0903   GFRNONAA 78 04/16/2016 1139   GFRAA 90 04/16/2016 1139       Component Value Date/Time   WBC 8.2 02/20/2017 1034   WBC 7.3 12/25/2016 1144   RBC 3.78 (L) 02/20/2017 1034   RBC 4.97 12/25/2016 1144   HGB 11.6 (L) 02/20/2017 1034   HCT 34.6 (L) 02/20/2017 1034   PLT 254 02/20/2017 1034   PLT 229 04/16/2016 1139   MCV 91.7 02/20/2017 1034   MCH 30.6 02/20/2017 1034   MCH 30.8 12/25/2016 1144   MCHC 33.4 02/20/2017 1034   MCHC 34.9 12/25/2016 1144   RDW 17.1 (H) 02/20/2017 1034   LYMPHSABS 0.4 (L) 02/20/2017 1034   MONOABS 1.5 (H) 02/20/2017 1034   EOSABS 0.0 02/20/2017 1034   EOSABS 0.5 (H) 04/16/2016 1139   BASOSABS 0.0 02/20/2017 1034   -------------------------------  Imaging from last 24 hours (if applicable):  Radiology interpretation: Dg Chest 2 View  Result Date: 02/20/2017 CLINICAL DATA:  Wheezing.  Shortness of breath. EXAM: CHEST  2 VIEW COMPARISON:  PET-CT 12/02/2016.  Chest x-ray 11/13/2016 . FINDINGS:  PowerPort catheter noted with lead tip over the superior vena cava. Heart size normal. Lungs are clear. No pleural effusion or pneumothorax. Tubing noted over the left upper abdomen consistent with gastrostomy catheter. No acute bony abnormality. IMPRESSION: 1. PowerPort catheter noted with tip over the superior vena cava. Gastrostomy tube noted over the left upper quadrant. 2. No acute cardiopulmonary disease. Electronically Signed   By: Marcello Moores  Register   On: 02/20/2017 10:06

## 2017-02-20 NOTE — Progress Notes (Signed)
  Radiation Oncology         [phone] ________________________________  Name: Terry Pacheco MRN: [id number]  Date: 02/18/2017  DOB: 1950-11-19  End of Treatment Note  Diagnosis:   66 y.o. male with Stage I (cT2, cN1, cM0) Right Tonsillar Squamous Cell Carcinoma     Indication for treatment:  Curative       Radiation treatment dates:   12/30/2016 - 02/18/2017  Site/dose:     Right tonsil and bilateral neck / 70 Gy in 35 fractions to gross disease, 63 Gy in 35 fractions to high risk nodal echelons, and 56 Gy in 35 fractions to intermediate risk nodal echelons  Beams/energy:   IMRT / 6 MV photons  Narrative: The patient tolerated radiation treatment relatively well.  He experienced mild fatigue and dysphagia/odynophagia, managed with lidocaine. He reported dry mouth, thick saliva, mouth sores, and taste changes. He continued to try eating solid foods and drinking protein shakes but reported often gagging and vomiting. He subsequently lost a few pounds towards the end of treatment. PEG tube was used as needed. He did have some radiation-related skin changes as well with dry desquamation over his neck. He used Sonafine lotion for this as directed.  Plan: The patient has completed radiation treatment. I have ordered a refill of his diflucan to treat possible oral yeast. He was also given a refill of Sonafine for his skin. The patient will return to radiation oncology clinic for routine followup in one half month. I advised the patient to call or return sooner if any questions or concerns arise that are related to recovery or treatment.  -----------------------------------  Eppie Gibson, MD  This document serves as a record of services personally performed by Eppie Gibson, MD. It was created on her behalf by Rae Lips, a trained medical scribe. The creation of this record is based on the scribe's personal observations and the provider's statements to them. This document has been checked  and approved by the attending provider.

## 2017-02-23 ENCOUNTER — Ambulatory Visit: Payer: 59

## 2017-02-25 ENCOUNTER — Ambulatory Visit: Payer: 59

## 2017-02-26 ENCOUNTER — Ambulatory Visit: Payer: 59

## 2017-02-26 ENCOUNTER — Other Ambulatory Visit: Payer: Self-pay | Admitting: Medical

## 2017-02-26 DIAGNOSIS — K209 Esophagitis, unspecified without bleeding: Secondary | ICD-10-CM

## 2017-02-26 MED ORDER — MAGIC MOUTHWASH W/LIDOCAINE: 5.0000 mL | Freq: Four times a day (QID) | ORAL | 1 refills | Status: DC | PRN

## 2017-02-26 MED FILL — CMPD MMW LID/MAALOX/DIPHEN: 15 days supply | Qty: 300 | Fill #0

## 2017-02-27 ENCOUNTER — Ambulatory Visit: Payer: 59

## 2017-02-27 ENCOUNTER — Other Ambulatory Visit: Payer: Self-pay

## 2017-02-27 DIAGNOSIS — K209 Esophagitis, unspecified without bleeding: Secondary | ICD-10-CM

## 2017-02-27 MED ORDER — MAGIC MOUTHWASH W/LIDOCAINE: 5.0000 mL | Freq: Four times a day (QID) | ORAL | 1 refills | Status: DC | PRN

## 2017-02-28 NOTE — Progress Notes (Signed)
[facility] Follow-up Visit:  Assessment: Carcinoma of tonsillar fossa ([place]) 67 y.o. male with diagnosis of squamous cell carcinoma of the tonsillar fossa.  Currently undergoing curative-intent systemic therapy with radiation and concurrent weekly cisplatin.  Symptomatically, patient is doing reasonably well, but lab work demonstrates neutropenia precluding administration of chemotherapy today.  Plan: --No chemo today --Refill opioid prescription --RTC 1 week with labs and possible chemotherapy  Voice recognition software was used and creation of this note. Despite my best effort at editing the text, some misspelling/errors may have occurred.  No orders of the defined types were placed in this encounter.   Cancer Staging Carcinoma of tonsillar fossa ([place]) Staging form: Pharynx - HPV-Mediated Oropharynx, AJCC 8th Edition - Clinical: Stage I (cT2, cN1, cM0) - Signed by Eppie Gibson, MD on 12/17/2016   All questions were answered.  . The patient knows to call the clinic with any problems, questions or concerns.  This note was electronically signed.    History of Presenting Illness Terry Pacheco 67 y.o. presenting to the [facility] for continued toxicity monitoring while receiving systemic chemotherapy with weekly cisplatin concurrent with radiation therapy for diagnosis of tonsillar squamous cell carcinoma.  At the present time, patient reports decreased oral intake due to pain in the mouth.  He is still able to eat ice cream, but more solid foods are no longer visible.  Using PEG tube without difficulties.  No interval fevers, chills or night sweats.  Continues to require pain control and is using oxycodone and lidocaine for the purpose..   Medical History: Past Medical History:  Diagnosis Date  . Arthritis   . GERD (gastroesophageal reflux disease) 07/06/2014  . Hypertension   . Sleep apnea    does not use CPAP  . Wears glasses     Surgical  History: Past Surgical History:  Procedure Laterality Date  . HERNIA REPAIR    . IR CV LINE INJECTION  01/14/2017  . IR FLUORO GUIDE PORT INSERTION RIGHT  12/25/2016  . IR GASTROSTOMY TUBE MOD SED  12/25/2016  . IR US GUIDE VASC ACCESS RIGHT  12/25/2016  . polyp removal  2008   during colonoscopy  . TOTAL HIP ARTHROPLASTY Right 10/16/2015   Procedure: RIGHT TOTAL HIP ARTHROPLASTY ANTERIOR APPROACH;  Surgeon: Paralee Cancel, MD;  Location: [facility];  Service: Orthopedics;  Laterality: Right;  . vocal cord polyp      removed 2-3 years ago     Family History: Family History  Problem Relation Age of Onset  . Heart disease Mother   . Stroke Father     Social History: Social History   Socioeconomic History  . Marital status: Married    Spouse name: Not on file  . Number of children: 1  . Years of education: Not on file  . Highest education level: Not on file  Social Needs  . Financial resource strain: Not on file  . Food insecurity - worry: Not on file  . Food insecurity - inability: Not on file  . Transportation needs - medical: Not on file  . Transportation needs - non-medical: Not on file  Occupational History  . Occupation: Retired     Fish farm manager: [employer]  Tobacco Use  . Smoking status: Former Smoker    Packs/day: 0.50    Years: 45.00    Pack years: 22.50    Types: Cigarettes    Start date: 01/12/1983    Last attempt to quit: 11/17/2016  Years since quitting: 0.2  . Smokeless tobacco: Never Used  Substance and Sexual Activity  . Alcohol use: Yes    Alcohol/week: 0.0 oz    Comment: rare  . Drug use: Yes    Types: Marijuana    Comment: last use 10/09/2015  . Sexual activity: Not on file  Other Topics Concern  . Not on file  Social History Narrative  . Not on file    Allergies: Allergies  Allergen Reactions  . Crestor [Rosuvastatin]     Muscle aches    Medications:  Current Outpatient Medications  Medication Sig Dispense Refill  . amLODipine  (NORVASC) 10 MG tablet TAKE 1 TABLET EVERY DAY FOR FOR BLOOD PRESSURE 90 tablet 0  . dexamethasone (DECADRON) 4 MG tablet Take 2 tablets by mouth once a day on the day after chemotherapy and then take 2 tablets two times a day for 2 days. Take with food. (Patient not taking: Reported on 02/20/2017) 30 tablet 0  . escitalopram (LEXAPRO) 20 MG tablet TAKE 0.5 TABLETS (10 MG TOTAL) BY MOUTH DAILY. 45 tablet 0  . lidocaine-prilocaine (EMLA) cream Apply 1 application as needed topically. 30 g 5  . Multiple Vitamin (MULTIVITAMIN) tablet Take 1 tablet by mouth daily.    . Nutritional Supplements (FEEDING SUPPLEMENT, OSMOLITE 1.5 CAL,) LIQD Give 1 bottle Osmolite 1.5 via PEG QID with 60 ml free water. Increase to 1.5 bottles QID as tolerated. Increase to goal of  1.5 bottles TID and 2 bottles once daily as tolerated. Give additional 250 mL free water three times daily. 6.5 Bottle 0  . omeprazole (PRILOSEC) 20 MG capsule TAKE 1 CAPSULE BY MOUTH EVERY DAY 90 capsule 0  . ondansetron (ZOFRAN) 8 MG tablet Take 1 tablet (8 mg total) 2 (two) times daily as needed by mouth. Start on the third day after chemotherapy. 30 tablet 0  . sodium fluoride (FLUORISHIELD) 1.1 % GEL dental gel Instill one drop of gel into each tooth space of fluoride tray. Place over teeth for 5 minutes. Remove. Spit out excess. Repeat nightly 120 mL prn  . sucralfate (CARAFATE) 1 GM/10ML suspension Take 10 mLs (1 g total) 4 (four) times daily -  with meals and at bedtime by mouth. 420 mL 1  . amoxicillin-clavulanate (AUGMENTIN) 875-125 MG tablet Take 1 tablet by mouth 2 (two) times daily. 20 tablet 0  . atorvastatin (LIPITOR) 40 MG tablet TAKE 1 TABLET BY MOUTH EVERY DAY (Patient not taking: Reported on 02/20/2017) 90 tablet 0  . fluconazole (DIFLUCAN) 100 MG tablet Take 2 tablets today, then 1 tablet daily x 13 more days for yeast infection. Hold Atorvastatin while on this. 15 tablet 0  . lidocaine (XYLOCAINE) 2 % solution Mix 1 part 2%viscous  lidocaine,1part H2O.Swish and swallow 68m of this mixture, 378m before meals and at bedtime, up to QID 100 mL 5  . magic mouthwash w/lidocaine SOLN Take 5 mLs by mouth 4 (four) times daily as needed for mouth pain. 1 Part viscous lidocaine 2% 1 Part Maalox. 1 Part diphenhydramine 12.5 mg per 5 ml elixir. 300 mL 1  . morphine (MS CONTIN) 15 MG 12 hr tablet Take 1 tablet (15 mg total) by mouth every 12 (twelve) hours. 60 tablet 0  . oxyCODONE (OXY IR/ROXICODONE) 5 MG immediate release tablet Take 1-2 tablets (5-10 mg total) by mouth every 4 (four) hours as needed for severe pain. 60 tablet 0  . prochlorperazine (COMPAZINE) 10 MG tablet Take 1 tablet (10 mg total) by mouth  every 6 (six) hours as needed (Nausea or vomiting). 30 tablet 1   No current facility-administered medications for this visit.     Review of Systems: Review of Systems - Oncology   PHYSICAL EXAMINATION Height _0  (1.753 m), weight 202 lb (91.6 kg).  ECOG PERFORMANCE STATUS: 1 - Symptomatic but completely ambulatory  Physical Exam  Constitutional: He is oriented to person, place, and time and well-developed, well-nourished, and in no distress. No distress.  HENT:  Head: Normocephalic and atraumatic.  Mouth/Throat: Oropharynx is clear and moist. No oropharyngeal exudate.  Eyes: Conjunctivae and EOM are normal. Pupils are equal, round, and reactive to light. No scleral icterus.  Neck: No thyromegaly present.  Cardiovascular: Normal rate, regular rhythm and normal heart sounds.  No murmur heard. Pulmonary/Chest: Effort normal and breath sounds normal. No respiratory distress. He has no wheezes. He has no rales.  Abdominal: Soft. Bowel sounds are normal. He exhibits no distension. There is no tenderness. There is no rebound and no guarding.  Musculoskeletal: He exhibits no edema.  Lymphadenopathy:    He has no cervical adenopathy.  Neurological: He is alert and oriented to person, place, and time. He has normal  reflexes. No cranial nerve deficit.  Skin: Skin is warm and dry. No rash noted. He is not diaphoretic. There is erythema. No pallor.     LABORATORY DATA: I have personally reviewed the data as listed: Appointment on 01/27/2017  Component Date Value Ref Range Status  . Phosphorus, Ser 01/27/2017 3.9  2.5 - 4.5 mg/dL Final  . Magnesium 01/27/2017 2.1  1.5 - 2.5 mg/dl Final  . Sodium 01/27/2017 133* 136 - 145 mEq/L Final  . Potassium 01/27/2017 4.3  3.5 - 5.1 mEq/L Final  . Chloride 01/27/2017 99  98 - 109 mEq/L Final  . CO2 01/27/2017 26  22 - 29 mEq/L Final  . Glucose 01/27/2017 96  70 - 140 mg/dl Final   Glucose reference range is for nonfasting patients. Fasting glucose reference range is 70- 100.  Marland Kitchen BUN 01/27/2017 22.6  7.0 - 26.0 mg/dL Final  . Creatinine 01/27/2017 1.0  0.7 - 1.3 mg/dL Final  . Total Bilirubin 01/27/2017 0.59  0.20 - 1.20 mg/dL Final  . Alkaline Phosphatase 01/27/2017 110  40 - 150 U/L Final  . AST 01/27/2017 26  5 - 34 U/L Final  . ALT 01/27/2017 48  0 - 55 U/L Final  . Total Protein 01/27/2017 6.5  6.4 - 8.3 g/dL Final  . Albumin 01/27/2017 3.3* 3.5 - 5.0 g/dL Final  . Calcium 01/27/2017 9.1  8.4 - 10.4 mg/dL Final  . Anion Gap 01/27/2017 8  3 - 11 mEq/L Final  . EGFR 01/27/2017 >60  >60 ml/min/1.73 m2 Final   eGFR is calculated using the CKD-EPI Creatinine Equation (2009)  . WBC 01/27/2017 1.2* 4.0 - 10.3 10e3/uL Final  . NEUT# 01/27/2017 0.6* 1.5 - 6.5 10e3/uL Final  . HGB 01/27/2017 12.3* 13.0 - 17.1 g/dL Final  . HCT 01/27/2017 36.1* 38.4 - 49.9 % Final  . Platelets 01/27/2017 55* 140 - 400 10e3/uL Final  . MCV 01/27/2017 90.3  79.3 - 98.0 fL Final  . MCH 01/27/2017 30.8  27.2 - 33.4 pg Final  . MCHC 01/27/2017 34.1  32.0 - 36.0 g/dL Final  . RBC 01/27/2017 4.00* 4.20 - 5.82 10e6/uL Final  . RDW 01/27/2017 13.7  11.0 - 14.6 % Final  . lymph# 01/27/2017 0.4* 0.9 - 3.3 10e3/uL Final  . MONO# 01/27/2017 0.1  0.1 - 0.9 10e3/uL Final  . Eosinophils  Absolute 01/27/2017 0.0  0.0 - 0.5 10e3/uL Final  . Basophils Absolute 01/27/2017 0.0  0.0 - 0.1 10e3/uL Final  . NEUT% 01/27/2017 53.5  39.0 - 75.0 % Final  . LYMPH% 01/27/2017 35.3  14.0 - 49.0 % Final  . MONO% 01/27/2017 9.5  0.0 - 14.0 % Final  . EOS% 01/27/2017 1.7  0.0 - 7.0 % Final  . BASO% 01/27/2017 0.0  0.0 - 2.0 % Final       Ardath Sax, MD

## 2017-02-28 NOTE — Assessment & Plan Note (Signed)
67 y.o. male with diagnosis of squamous cell carcinoma of the tonsillar fossa.  Currently undergoing curative-intent systemic therapy with radiation and concurrent weekly cisplatin.  Symptomatically, patient is doing reasonably well, but lab work demonstrates neutropenia precluding administration of chemotherapy today.  Plan: --No chemo today --Refill opioid prescription --RTC 1 week with labs and possible chemotherapy

## 2017-03-02 ENCOUNTER — Ambulatory Visit: Payer: 59

## 2017-03-02 ENCOUNTER — Other Ambulatory Visit: Payer: Self-pay | Admitting: *Deleted

## 2017-03-02 DIAGNOSIS — C090 Malignant neoplasm of tonsillar fossa: Secondary | ICD-10-CM

## 2017-03-02 MED FILL — LIDOCAINE 2% VISCOUS SOLN: 2 | 3 days supply | Qty: 100 | Fill #1

## 2017-03-03 ENCOUNTER — Ambulatory Visit: Payer: 59

## 2017-03-03 ENCOUNTER — Inpatient Hospital Stay: Payer: 59

## 2017-03-03 ENCOUNTER — Inpatient Hospital Stay: Payer: 59 | Attending: Hematology | Admitting: Hematology

## 2017-03-03 ENCOUNTER — Other Ambulatory Visit: Payer: Self-pay | Admitting: Radiology

## 2017-03-03 VITALS — BP 120/77 | HR 79 | Temp 98.2°F | Resp 18 | Ht 69.0 in | Wt 189.3 lb

## 2017-03-03 DIAGNOSIS — C090 Malignant neoplasm of tonsillar fossa: Secondary | ICD-10-CM

## 2017-03-03 DIAGNOSIS — Z800 Family history of malignant neoplasm of digestive organs: Secondary | ICD-10-CM | POA: Insufficient documentation

## 2017-03-03 DIAGNOSIS — K1233 Oral mucositis (ulcerative) due to radiation: Secondary | ICD-10-CM

## 2017-03-03 DIAGNOSIS — Z931 Gastrostomy status: Secondary | ICD-10-CM | POA: Diagnosis not present

## 2017-03-03 DIAGNOSIS — I10 Essential (primary) hypertension: Secondary | ICD-10-CM | POA: Diagnosis not present

## 2017-03-03 DIAGNOSIS — Z87891 Personal history of nicotine dependence: Secondary | ICD-10-CM | POA: Diagnosis not present

## 2017-03-03 DIAGNOSIS — K1230 Oral mucositis (ulcerative), unspecified: Secondary | ICD-10-CM | POA: Diagnosis not present

## 2017-03-03 LAB — CMP (CANCER CENTER ONLY)
ALT: 33 U/L (ref 0–55)
ANION GAP: 7 (ref 3–11)
AST: 27 U/L (ref 5–34)
Albumin: 3.5 g/dL (ref 3.5–5.0)
Alkaline Phosphatase: 106 U/L (ref 40–150)
BILIRUBIN TOTAL: 0.6 mg/dL (ref 0.2–1.2)
BUN: 13 mg/dL (ref 7–26)
CHLORIDE: 102 mmol/L (ref 98–109)
CO2: 26 mmol/L (ref 22–29)
Calcium: 9.5 mg/dL (ref 8.4–10.4)
Creatinine: 1.01 mg/dL (ref 0.70–1.30)
GFR, Est AFR Am: >60 mL/min (ref >60)
Glucose, Bld: 101 mg/dL (ref 70–140)
Potassium: 4.5 mmol/L (ref 3.5–5.1)
Sodium: 135 mmol/L — ABNORMAL LOW (ref 136–145)
TOTAL PROTEIN: 6.6 g/dL (ref 6.4–8.3)

## 2017-03-03 LAB — CBC WITH DIFFERENTIAL (CANCER CENTER ONLY)
Abs Granulocyte: 2.5 10*3/uL (ref 1.5–6.5)
BASOS PCT: 1 %
Basophils Absolute: 0.1 10*3/uL (ref 0.0–0.1)
EOS PCT: 7 %
Eosinophils Absolute: 0.3 10*3/uL (ref 0.0–0.5)
HEMATOCRIT: 36.7 % — AB (ref 38.4–49.9)
Hemoglobin: 12.4 g/dL — ABNORMAL LOW (ref 13.0–17.1)
LYMPHS PCT: 21 %
Lymphs Abs: 0.9 10*3/uL (ref 0.9–3.3)
MCH: 31.2 pg (ref 27.2–33.4)
MCHC: 33.8 g/dL (ref 32.0–36.0)
MCV: 92.2 fL (ref 79.3–98.0)
MONO ABS: 0.5 10*3/uL (ref 0.1–0.9)
MONOS PCT: 12 %
Neutro Abs: 2.5 10*3/uL (ref 1.5–6.5)
Neutrophils Relative %: 59 %
PLATELETS: 135 10*3/uL — AB (ref 140–400)
RBC: 3.97 MIL/uL — ABNORMAL LOW (ref 4.20–5.82)
RDW: 18.5 % — AB (ref 11.0–15.6)
WBC: 4.2 10*3/uL (ref 4.0–10.3)

## 2017-03-03 MED ORDER — MORPHINE SULFATE ER 15 MG PO TBCR: 15.0000 mg | EXTENDED_RELEASE_TABLET | Freq: Two times a day (BID) | ORAL | 0 refills | Status: DC

## 2017-03-03 NOTE — Patient Instructions (Signed)
Thank you for choosing [facility] to provide your oncology and hematology care.  To afford each patient quality time with our providers, please arrive 30 minutes before your scheduled appointment time.  If you arrive late for your appointment, you may be asked to reschedule.  We strive to give you quality time with our providers, and arriving late affects you and other patients whose appointments are after yours.   If you are a no show for multiple scheduled visits, you may be dismissed from the clinic at the providers discretion.    Again, thank you for choosing [facility], our hope is that these requests will decrease the amount of time that you wait before being seen by our physicians.  ______________________________________________________________________  Should you have questions after your visit to the  Cancer Center, please contact our office at [phone] between the hours of 8:30 and 4:30 p.m.    Voicemails left after 4:30p.m will not be returned until the following business day.    For prescription refill requests, please have your pharmacy contact us directly.  Please also try to allow 48 hours for prescription requests.    Please contact the scheduling department for questions regarding scheduling.  For scheduling of procedures such as PET scans, CT scans, MRI, Ultrasound, etc please contact central scheduling at [phone].    Resources For Cancer Patients and Caregivers:   Oncolink.org:  A wonderful resource for patients and healthcare providers for information regarding your disease, ways to tract your treatment, what to expect, etc.     American Cancer Society:  800-227-2345  Can help patients locate various types of support and financial assistance  Cancer Care: 1-800-813-HOPE (4673) Provides financial assistance, online support groups, medication/co-pay assistance.    [place] DSS:  336-641-3447 Where to apply for food  stamps, Medicaid, and utility assistance  Medicare Rights Center: 800-333-4114 Helps people with Medicare understand their rights and benefits, navigate the Medicare system, and secure the quality healthcare they deserve  SCAT: 336-333-6589 [employer] [employer]'s shared-ride transportation service for eligible riders who have a disability that prevents them from riding the fixed route bus.    For additional information on assistance programs please contact our social worker:   Grier Hock/Abigail Elmore:  [phone]            

## 2017-03-03 NOTE — Progress Notes (Signed)
HEMATOLOGY/ONCOLOGY FOLLOW UP VISIT NOTE  Date of Service: 03/03/2017  Patient Care Team: Claretta Fraise, MD as PCP - General (Family Medicine) Jodi Marble, MD as Consulting Physician (Otolaryngology) Eppie Gibson, MD as Attending Physician (Radiation Oncology) Leota Sauers, RN as Oncology Nurse Navigator (Oncology) Karie Mainland, RD as Dietitian (Nutrition) Jomarie Longs, PT as Physical Therapist (Physical Therapy) Sharen Counter, CCC-SLP as Speech Language Pathologist (Speech Pathology) Kennith Center, LCSW as Social Worker  CHIEF COMPLAINTS/PURPOSE OF CONSULTATION:  Newly diagnosed tonsillar squamous cell carcinoma  HISTORY OF PRESENTING ILLNESS:   Terry Pacheco is a wonderful 67 y.o. male who has been referred to [place] by ENT Specialist, Dr. Erik Obey for evaluation and management of invasive squamous cell carcinoma of tonsil.   He has a PMHx of GERD, HTN, High cholesterol which he takes medications for. He denies PMHx of seizures. He is accompanied by his wife and sister today. He reports that he is doing well overall. The pt initially presented with sudden-onset right lateral tongue numbness that has been ongoing for approximately 1 year. Subsequently, he had left sided gum swelling and intermittent sore throat symptoms. He notes no stroke-like symptoms. He notes that following these symptoms, he was evaluated by multiple providers including an ENT specialist and a dentist prior to meeting with Dr. Erik Obey. Pt wife noted swelling and discoloration inside his mouth prior to the patient being evaluated by Dr. Erik Obey. At his first visit with Dr. Erik Obey, he noted an abnormality to his left tonsil and completed a biopsy that same day.   He has had CT soft tissue neck with contrast on 11/18/2016 with results showing: Fullness of the right palatine tonsil. Surrounding fat planes are ill-defined. This area is obscured by dental artifact. Possible right tonsillar carcinoma.    The patient has had biopsy completed on 11/19/2016 with results of: "RIGHT FAUCIAL TONSIL", BIOPSY: Squamous cell carcinoma, suspicious for superficial invasion in this material. Immunostain p16 positive.   He also had a PET scan completed on 12/02/2016 with results revealing: IMPRESSION: 1. Mildly asymmetric tonsillar activity along the right tonsillar sinus, maximum SUV 7.7 as compared to the left side 5.2, suspicious in this setting for right-sided malignancy. There is also a mildly enlarged and hypermetabolic right level IIa lymph node with maximum SUV of 8.4. No evidence of other metastatic spread. 2. Faint accentuation of activity in the left lateral prostate gland apex, but low-grade. Correlate with PSA level in determining whether further workup is warranted. 3. Other imaging findings of potential clinical significance: Aortic Atherosclerosis (ICD10-I70.0) and Emphysema (ICD10-J43.9). Coronary atherosclerosis. Small type 1 hiatal hernia. Bilateral renal cysts. Colonic diverticulosis. Lastly, he had a MR Face trigeminal on 12/09/2016 with results of IMPRESSION: 1. No evidence of perineural spread of malignancy. 2. Mild asymmetry of the palatine tonsils without discrete mass. Correlate with direct visualization.   As far as surgeries, he has had a polyp on left sided throat that was removed 3-4 years ago that resulted negative for malignancy. He also has had a right hip replacement. Lastly, he has had a left inguinal hernia operation in 1978. He started smoking cigarettes at age 34 and would smoke 1 PPD while working. When he retired several years ago he smoked 0.5 PPD and has quit smoking cigarettes 1 month ago since his diagnosis. He states that he used a vape after quitting but was advised to quit via Dr. Isidore Moos. He occassionally consumes ETOH. 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 Denies allergies to medications at this time. He reports that his  father died of a stroke at age 19 and his mother died from cardiac issues. Mother had gastric cancer and was diagnosed in late 78's. Denies any other cancers of blood disorders in the family.    On review of systems, he denies hematuria, dysuria, or urinary retention. He reports urinary frequency over the past year. He reports bowel incontinence following a bowel movement x 1 year that has gradually improved more recently. He notes that his bowel incontinence has been intermittent. He has well formed stools without blood in stools or rectal bleeding. He denies increase in bowel movements and he has up to 1 bowel movement daily. He has had 2-3 occurrences of hemorrhoids with no recent flares. He denies mucous in his stools. He denies injuries or surgeries to his rectal area. He denies bladder incontinence. He has lower back pain that has been chronic and unchanged. He has a prior hx of diverticulitis approximately 10 years ago that was followed with colonoscopy and was then diagnosed with diverticulosis. He denies headache at this time. He is able to ambulate for prolonged periods without dyspnea on exertion. 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 He has had an audiogram completed by an ENT specialist in [place], :  Cisplatin weeks with concurrent XRT starting 12/30/16 completed  INTERIM HISTORY:   YIDEL TEUSCHER presents today for scheduled follow up and post completion of his cisplatin treatment. He is accompanied by his wife. He is doing well overall. His throat is better, however he is still not consuming solid foods. He states he is able to consume liquids such as soup. He is consuming 4 cans a day through the tube. His wife states the nutritionist advised him to use at least 6 cans, Mr. Ogle states he doesn't feel hungry but will try to up his tube feeding per nutritionist recommendations. No fevers/chills/night sweats.   He is currently taking 2 daily of 5mg  Oxycodone.   On review of systems, pt reports weight loss, change of taste, constipation and denies dysuria, fever, chills, night sweats, back pain, abdominal pain and any other accompanying symptoms.    MEDICAL HISTORY:  Past Medical History:  Diagnosis Date  . Arthritis   . GERD (gastroesophageal reflux disease) 07/06/2014  . Hypertension   . Sleep apnea    does not use CPAP  . Wears glasses     SURGICAL HISTORY: Past Surgical History:  Procedure Laterality Date  . HERNIA REPAIR    . IR CV LINE INJECTION  01/14/2017  . IR FLUORO GUIDE PORT INSERTION RIGHT  12/25/2016  . IR GASTROSTOMY TUBE MOD SED  12/25/2016  . IR US GUIDE VASC ACCESS RIGHT  12/25/2016  . polyp removal  2008   during colonoscopy  . TOTAL HIP ARTHROPLASTY Right 10/16/2015   Procedure: RIGHT TOTAL HIP ARTHROPLASTY ANTERIOR APPROACH;  Surgeon: Paralee Cancel, MD;  Location: [facility];  Service: Orthopedics;  Laterality: Right;  . vocal cord polyp      removed 2-3 years ago     SOCIAL HISTORY: Social History   Socioeconomic History  . Marital status: Married    Spouse name: Not on file  . Number of children: 1  . Years of education: Not on file  . Highest education level: Not on file  Social Needs  . Financial  resource strain: Not on file  . Food insecurity - worry: Not on file  . Food insecurity - inability: Not on file  . Transportation needs - medical: Not on file  . Transportation needs - non-medical: Not on file  Occupational History  . Occupation: Retired     Fish farm manager: [employer]  Tobacco Use  . Smoking status: Former Smoker    Packs/day: 0.50    Years: 45.00    Pack years: 22.50    Types: Cigarettes    Start date: 01/12/1983    Last attempt to quit: 11/17/2016    Years since quitting: 0.2  . Smokeless tobacco: Never Used  Substance and Sexual Activity  . Alcohol use: Yes    Alcohol/week: 0.0 oz    Comment: rare  . Drug use: Yes    Types:  Marijuana    Comment: last use 10/09/2015  . Sexual activity: Not on file  Other Topics Concern  . Not on file  Social History Narrative  . Not on file    FAMILY HISTORY: Family History  Problem Relation Age of Onset  . Heart disease Mother   . Stroke Father     ALLERGIES:  is allergic to crestor [rosuvastatin].  MEDICATIONS:  Current Outpatient Medications  Medication Sig Dispense Refill  . amLODipine (NORVASC) 10 MG tablet TAKE 1 TABLET EVERY DAY FOR FOR BLOOD PRESSURE 90 tablet 0  . atorvastatin (LIPITOR) 40 MG tablet TAKE 1 TABLET BY MOUTH EVERY DAY 90 tablet 0  . escitalopram (LEXAPRO) 20 MG tablet TAKE 0.5 TABLETS (10 MG TOTAL) BY MOUTH DAILY. 45 tablet 0  . lidocaine (XYLOCAINE) 2 % solution Mix 1 part 2%viscous lidocaine,1part H2O.Swish and swallow 65mL of this mixture, 10min before meals and at bedtime, up to QID 100 mL 5  . lidocaine-prilocaine (EMLA) cream Apply 1 application as needed topically. 30 g 5  . magic mouthwash w/lidocaine SOLN Take 5 mLs by mouth 4 (four) times daily as needed for mouth pain. 1 Part viscous lidocaine 2% 1 Part Maalox. 1 Part diphenhydramine 12.5 mg per 5 ml elixir. 300 mL 1  . morphine (MS CONTIN) 15 MG 12 hr tablet Take 1 tablet (15 mg total) by mouth every 12 (twelve) hours. 60 tablet 0  . Multiple Vitamin (MULTIVITAMIN) tablet Take 1 tablet by mouth daily.    . Nutritional Supplements (FEEDING SUPPLEMENT, OSMOLITE 1.5 CAL,) LIQD Give 1 bottle Osmolite 1.5 via PEG QID with 60 ml free water. Increase to 1.5 bottles QID as tolerated. Increase to goal of  1.5 bottles TID and 2 bottles once daily as tolerated. Give additional 250 mL free water three times daily. 6.5 Bottle 0  . omeprazole (PRILOSEC) 20 MG capsule TAKE 1 CAPSULE BY MOUTH EVERY DAY 90 capsule 0  . ondansetron (ZOFRAN) 8 MG tablet Take 1 tablet (8 mg total) 2 (two) times daily as needed by mouth. Start on the third day after chemotherapy. 30 tablet 0  . oxyCODONE (OXY IR/ROXICODONE)  5 MG immediate release tablet Take 1-2 tablets (5-10 mg total) by mouth every 4 (four) hours as needed for severe pain. 60 tablet 0  . sodium fluoride (FLUORISHIELD) 1.1 % GEL dental gel Instill one drop of gel into each tooth space of fluoride tray. Place over teeth for 5 minutes. Remove. Spit out excess. Repeat nightly 120 mL prn  . sucralfate (CARAFATE) 1 GM/10ML suspension Take 10 mLs (1 g total) 4 (four) times daily -  with meals and at bedtime by mouth.  420 mL 1  . prochlorperazine (COMPAZINE) 10 MG tablet Take 1 tablet (10 mg total) by mouth every 6 (six) hours as needed (Nausea or vomiting). (Patient not taking: Reported on 03/03/2017) 30 tablet 1   No current facility-administered medications for this visit.     REVIEW OF SYSTEMS:    A 10+ POINT REVIEW OF SYSTEMS WAS OBTAINED including neurology, dermatology, psychiatry, cardiac, respiratory, lymph, extremities, GI, GU, Musculoskeletal, constitutional, breasts, reproductive, HEENT.  All pertinent positives are noted in the HPI.  All others are negative.  PHYSICAL EXAMINATION: ECOG PERFORMANCE STATUS: 1 - Symptomatic but completely ambulatory VS reviewed in EPIC GENERAL:alert, in no acute distress and comfortable SKIN: no acute rashes, no significant lesions EYES: conjunctiva are pink and non-injected, sclera anicteric OROPHARYNX: Right tonsillar mass that is close to the base of the tongue and extending into the right side of the roof of his mouth. MMM, no exudates, no oropharyngeal erythema or ulceration (+) oral Thrush, mucositis   NECK: supple, no JVD. Small 1 cm lymph node in right upper neck.  LYMPH:  no palpable lymphadenopathy in the cervical, axillary or inguinal regions LUNGS: clear to auscultation b/l with normal respiratory effort HEART: 2/6 systolic murmur. Regular rate & rhythm ABDOMEN:  normoactive bowel sounds , non tender, not distended. Extremity: no pedal edema PSYCH: alert & oriented x 3 with fluent speech NEURO:  no focal motor/sensory deficits    LABORATORY DATA:  I have reviewed the data as listed  Component     Latest Ref Rng & Units 03/03/2017  WBC Count     4.0 - 10.3 K/uL 4.2  RBC     4.20 - 5.82 MIL/uL 3.97 (L)  Hemoglobin     13.0 - 17.1 g/dL 12.4 (L)  HCT     38.4 - 49.9 % 36.7 (L)  MCV     79.3 - 98.0 fL 92.2  MCH     27.2 - 33.4 pg 31.2  MCHC     32.0 - 36.0 g/dL 33.8  RDW     11.0 - 15.6 % 18.5 (H)  Platelet Count     140 - 400 K/uL 135 (L)  Neutrophils     % 59  NEUT#     1.5 - 6.5 K/uL 2.5  Abs Granulocyte     1.5 - 6.5 K/uL 2.5  Lymphocytes     % 21  Lymphocyte #     0.9 - 3.3 K/uL 0.9  Monocytes Relative     % 12  Monocyte #     0.1 - 0.9 K/uL 0.5  Eosinophil     % 7  Eosinophils Absolute     0.0 - 0.5 K/uL 0.3  Basophil     % 1  Basophils Absolute     0.0 - 0.1 K/uL 0.1  Sodium     136 - 145 mmol/L 135 (L)  Potassium     3.5 - 5.1 mmol/L 4.5  Chloride     98 - 109 mmol/L 102  CO2     22 - 29 mmol/L 26  Glucose     70 - 140 mg/dL 101  BUN     7 - 26 mg/dL 13  Creatinine     0.70 - 1.30 mg/dL 1.01  Calcium     8.4 - 10.4 mg/dL 9.5  Total Protein     6.4 - 8.3 g/dL 6.6  Albumin     3.5 - 5.0 g/dL 3.5  AST  5 - 34 U/L 27  ALT     0 - 55 U/L 33  Alkaline Phosphatase     40 - 150 U/L 106  Total Bilirubin     0.2 - 1.2 mg/dL 0.6  GFR, Est Non Af Am     >60 mL/min >60  GFR, Est AFR Am     >60 mL/min >60  Anion gap     3 - 11 7    CBC Latest Ref Rng & Units 03/03/2017 02/20/2017 02/10/2017  WBC 4.0 - 10.3 10e3/uL - 8.2 5.3  Hemoglobin 13.0 - 17.1 g/dL - 11.6(L) 11.8(L)  Hematocrit 38.4 - 49.9 % 36.7(L) 34.6(L) 34.2(L)  Platelets 140 - 400 10e3/uL - 254 362   ANC 700 . CMP Latest Ref Rng & Units 03/03/2017 02/10/2017 02/03/2017  Glucose 70 - 140 mg/dL 101 106 114  BUN 7 - 26 mg/dL 13 14.9 16.1  Creatinine 0.7 - 1.3 mg/dL - 1.0 1.0  Sodium 136 - 145 mmol/L 135(L) 134(L) 133(L)  Potassium 3.5 - 5.1 mmol/L 4.5 4.7 4.5   Chloride 98 - 109 mmol/L 102 - -  CO2 22 - 29 mmol/L 26 27 27   Calcium 8.4 - 10.4 mg/dL 9.5 9.2 9.4  Total Protein 6.4 - 8.3 g/dL 6.6 6.7 6.6  Total Bilirubin 0.2 - 1.2 mg/dL 0.6 0.60 0.50  Alkaline Phos 40 - 150 U/L 106 121 100  AST 5 - 34 U/L 27 18 21   ALT 0 - 55 U/L 33 23 34    PATHOLOGY:    RADIOGRAPHIC STUDIES: I have personally reviewed the radiological images as listed and agreed with the findings in the report. Dg Chest 2 View  Result Date: 02/20/2017 CLINICAL DATA:  Wheezing.  Shortness of breath. EXAM: CHEST  2 VIEW COMPARISON:  PET-CT 12/02/2016.  Chest x-ray 11/13/2016 . FINDINGS: PowerPort catheter noted with lead tip over the superior vena cava. Heart size normal. Lungs are clear. No pleural effusion or pneumothorax. Tubing noted over the left upper abdomen consistent with gastrostomy catheter. No acute bony abnormality. IMPRESSION: 1. PowerPort catheter noted with tip over the superior vena cava. Gastrostomy tube noted over the left upper quadrant. 2. No acute cardiopulmonary disease. Electronically Signed   By: Marcello Moores  Register   On: 02/20/2017 10:06    ASSESSMENT & PLAN:   67 y.o. 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 -creatinine WNL. Some concern for decreased hearing and baseline tinnitus due to previous job noise related injury. Baseline audiogram showed normal symmetric low-frequency hearing, dropping off substantially above 2000Hz .  s/p recent completion of concurrent chemo-radiation.  Plan -patient has completed concurrent chemotherapy (Cisplatin weekly) with concurrent RT on 02/18/2017 -recent URI -- treated with abx in symptom management clinic -no change in hearing. Resolving oral mucositis -labs today stable -Fever  counseling was given, I have advised him to seek immediate emergency medical attention if he were to spike high fevers.   -Will discontinue MS Contin and will continue Oxycodone PRN and wean as oral pain resolves -Continue magic mouth wash as needed and Sucralfate -Baseline post tx scan in 2-3 months -Set a post treatment followup with ENT in 2 months for detailed ENT evaluation -Continue fu with Dr Isidore Moos as per her recommendation -Fu with Ernestene Kiel for  tube feeding and nutrition support evaluation in 2-3 weeks -TSH in 6-12 months -IR for port removal   PET/CT scan in 8 weeks Follow-up with ENT Dr. Erik Obey for posttreatment follow-up in 2 months for detailed ENT evaluation. Continue follow-up with Dr. Isidore Moos as per her recommendations. Return to clinic with Dr. Irene Limbo follow-up with Pamala Hurry and if in 2 months with repeat labs Follow-up with Ernestene Kiel for tube feeding and nutrition support reevaluation in 2-3 weeks. IR for port removal  All of the patients questions were answered with apparent satisfaction. The patient knows to call the clinic with any problems, questions or concerns.  I spent 20 minutes counseling the patient face to face. The total time spent in the appointment was 25 minutes and more than 50% was on counseling and direct patient cares.  Sullivan Lone MD [place] AAHIVMS [facility] Hematology/Oncology Physician [facility]  (Office):       [phone] (Work cell):  [phone] (Fax):           [phone]  03/03/2017 9:16 AM  This document serves as a record of services personally performed by Sullivan Lone, MD. It was created on his behalf by Alean Rinne, a trained medical scribe. The creation of this record is based on the scribe's personal observations and the provider's statements to them.   .I have reviewed the above documentation for accuracy and completeness, and I agree with the above. Brunetta Genera MD MS

## 2017-03-04 ENCOUNTER — Telehealth: Payer: Self-pay | Admitting: Hematology

## 2017-03-04 ENCOUNTER — Ambulatory Visit: Payer: 59

## 2017-03-04 DIAGNOSIS — D400 Neoplasm of uncertain behavior of prostate: Secondary | ICD-10-CM | POA: Diagnosis not present

## 2017-03-04 NOTE — Telephone Encounter (Signed)
Spoke to patients wife regarding upcoming January appointments per 1/8 sch message.

## 2017-03-05 ENCOUNTER — Telehealth: Payer: Self-pay | Admitting: Hematology

## 2017-03-05 ENCOUNTER — Ambulatory Visit: Payer: 59

## 2017-03-05 ENCOUNTER — Other Ambulatory Visit: Payer: Self-pay | Admitting: Radiology

## 2017-03-05 NOTE — Telephone Encounter (Signed)
Scheduled appt  Per 1/8 [place] pt reminder letter letter in the mail. With appt date and time.

## 2017-03-06 ENCOUNTER — Encounter (HOSPITAL_COMMUNITY): Payer: Self-pay

## 2017-03-06 ENCOUNTER — Ambulatory Visit: Payer: 59

## 2017-03-06 ENCOUNTER — Ambulatory Visit (HOSPITAL_COMMUNITY)
Admission: RE | Admit: 2017-03-06 | Discharge: 2017-03-06 | Disposition: A | Discharge status: Home / Self Care | Payer: 59 | Source: Ambulatory Visit | Attending: Hematology | Admitting: Hematology

## 2017-03-06 DIAGNOSIS — Z452 Encounter for adjustment and management of vascular access device: Secondary | ICD-10-CM | POA: Insufficient documentation

## 2017-03-06 DIAGNOSIS — I10 Essential (primary) hypertension: Secondary | ICD-10-CM | POA: Diagnosis not present

## 2017-03-06 DIAGNOSIS — Z85818 Personal history of malignant neoplasm of other sites of lip, oral cavity, and pharynx: Secondary | ICD-10-CM | POA: Insufficient documentation

## 2017-03-06 DIAGNOSIS — G4730 Sleep apnea, unspecified: Secondary | ICD-10-CM | POA: Insufficient documentation

## 2017-03-06 DIAGNOSIS — C090 Malignant neoplasm of tonsillar fossa: Secondary | ICD-10-CM

## 2017-03-06 DIAGNOSIS — Z87891 Personal history of nicotine dependence: Secondary | ICD-10-CM | POA: Insufficient documentation

## 2017-03-06 DIAGNOSIS — Z96641 Presence of right artificial hip joint: Secondary | ICD-10-CM | POA: Diagnosis not present

## 2017-03-06 DIAGNOSIS — Z931 Gastrostomy status: Secondary | ICD-10-CM | POA: Insufficient documentation

## 2017-03-06 HISTORY — PX: IR REMOVAL TUN ACCESS W/ PORT W/O FL MOD SED: IMG2290

## 2017-03-06 LAB — CBC WITH DIFFERENTIAL/PLATELET
BASOS PCT: 0 %
Basophils Absolute: 0 10*3/uL (ref 0.0–0.1)
EOS ABS: 0.4 10*3/uL (ref 0.0–0.7)
EOS PCT: 8 %
HCT: 36.2 % — ABNORMAL LOW (ref 39.0–52.0)
Hemoglobin: 12.3 g/dL — ABNORMAL LOW (ref 13.0–17.0)
LYMPHS ABS: 1 10*3/uL (ref 0.7–4.0)
Lymphocytes Relative: 22 %
MCH: 31.3 pg (ref 26.0–34.0)
MCHC: 34 g/dL (ref 30.0–36.0)
MCV: 92.1 fL (ref 78.0–100.0)
MONO ABS: 0.5 10*3/uL (ref 0.1–1.0)
MONOS PCT: 11 %
Neutro Abs: 2.9 10*3/uL (ref 1.7–7.7)
Neutrophils Relative %: 59 %
PLATELETS: 134 10*3/uL — AB (ref 150–400)
RBC: 3.93 MIL/uL — ABNORMAL LOW (ref 4.22–5.81)
RDW: 16.2 % — AB (ref 11.5–15.5)
WBC: 4.8 10*3/uL (ref 4.0–10.5)

## 2017-03-06 LAB — PROTIME-INR
INR: 1.01
PROTHROMBIN TIME: 13.2 s (ref 11.4–15.2)

## 2017-03-06 MED ORDER — MIDAZOLAM HCL 2 MG/2ML IJ SOLN
INTRAMUSCULAR | Status: AC | PRN
  Administered 2017-03-06 (×2): 1 mg via INTRAVENOUS

## 2017-03-06 MED ORDER — SODIUM CHLORIDE 0.9 % IV SOLN
INTRAVENOUS | Status: DC
  Administered 2017-03-06: 12:00:00 via INTRAVENOUS

## 2017-03-06 MED ORDER — LIDOCAINE-EPINEPHRINE (PF) 2 %-1:200000 IJ SOLN
INTRAMUSCULAR | Status: AC
  Filled 2017-03-06: qty 20

## 2017-03-06 MED ORDER — FENTANYL CITRATE (PF) 100 MCG/2ML IJ SOLN
INTRAMUSCULAR | Status: AC | PRN
  Administered 2017-03-06 (×2): 50 ug via INTRAVENOUS

## 2017-03-06 MED ORDER — MIDAZOLAM HCL 2 MG/2ML IJ SOLN
INTRAMUSCULAR | Status: AC
  Filled 2017-03-06: qty 2

## 2017-03-06 MED ORDER — FENTANYL CITRATE (PF) 100 MCG/2ML IJ SOLN
INTRAMUSCULAR | Status: AC
  Filled 2017-03-06: qty 2

## 2017-03-06 MED ORDER — HYDROCODONE-ACETAMINOPHEN 5-325 MG PO TABS: 1.0000 | ORAL_TABLET | ORAL | Status: DC | PRN

## 2017-03-06 MED ORDER — CEFAZOLIN SODIUM-DEXTROSE 2-4 GM/100ML-% IV SOLN
2.0000 g | INTRAVENOUS | Status: AC
  Administered 2017-03-06: 2 g via INTRAVENOUS

## 2017-03-06 MED ORDER — CEFAZOLIN SODIUM-DEXTROSE 2-4 GM/100ML-% IV SOLN
INTRAVENOUS | Status: AC
  Administered 2017-03-06: 2 g via INTRAVENOUS
  Filled 2017-03-06: qty 100

## 2017-03-06 NOTE — Procedures (Signed)
Successful removal of right chest port. Minimal blood loss and no immediate complication.   

## 2017-03-06 NOTE — Discharge Instructions (Signed)
Moderate Conscious Sedation, Adult, Care After These instructions provide you with information about caring for yourself after your procedure. Your health care provider may also give you more specific instructions. Your treatment has been planned according to current medical practices, but problems sometimes occur. Call your health care provider if you have any problems or questions after your procedure. What can I expect after the procedure? After your procedure, it is common:  To feel sleepy for several hours.  To feel clumsy and have poor balance for several hours.  To have poor judgment for several hours.  To vomit if you eat too soon.  Follow these instructions at home: For at least 24 hours after the procedure:   Do not: ? Participate in activities where you could fall or become injured. ? Drive. ? Use heavy machinery. ? Drink alcohol. ? Take sleeping pills or medicines that cause drowsiness. ? Make important decisions or sign legal documents. ? Take care of children on your own.  Rest. Eating and drinking  Follow the diet recommended by your health care provider.  If you vomit: ? Drink water, juice, or soup when you can drink without vomiting. ? Make sure you have little or no nausea before eating solid foods. General instructions  Have a responsible adult stay with you until you are awake and alert.  Take over-the-counter and prescription medicines only as told by your health care provider.  If you smoke, do not smoke without supervision.  Keep all follow-up visits as told by your health care provider. This is important. Contact a health care provider if:  You keep feeling nauseous or you keep vomiting.  You feel light-headed.  You develop a rash.  You have a fever. Get help right away if:  You have trouble breathing. This information is not intended to replace advice given to you by your health care provider. Make sure you discuss any questions you have  with your health care provider. Document Released: 12/01/2012 Document Revised: 07/16/2015 Document Reviewed: 06/02/2015 Elsevier Interactive Patient Education  2018 Enhaut Removal, Care After Refer to this sheet in the next few weeks. These instructions provide you with information about caring for yourself after your procedure. Your health care provider may also give you more specific instructions. Your treatment has been planned according to current medical practices, but problems sometimes occur. Call your health care provider if you have any problems or questions after your procedure. What can I expect after the procedure? After the procedure, it is common to have:  Soreness or pain near your incision.  Some swelling or bruising near your incision.  Follow these instructions at home: Medicines  Take over-the-counter and prescription medicines only as told by your health care provider.  If you were prescribed an antibiotic medicine, take it as told by your health care provider. Do not stop taking the antibiotic even if you start to feel better. Bathing  Do not take baths, swim, or use a hot tub until your health care provider approves. Ask your health care provider if you can take showers. You may only be allowed to take sponge baths for bathing.  You may shower tomorrow. Incision care  Follow instructions from your health care provider about how to take care of your incision. Make sure you: ? Wash your hands with soap and water before you change your bandage (dressing). If soap and water are not available, use hand sanitizer. ? Change your dressing as told by your health care  provider.  You may remove your dressing tomorrow. ? Keep your dressing dry. ? Leave skin glue, or adhesive strips in place. These skin closures may need to stay in place for 2 weeks or longer. If adhesive strip edges start to loosen and curl up, you may trim the loose edges. Do not remove  adhesive strips completely unless your health care provider tells you to do that.  Check your incision area every day for signs of infection. Check for: ? More redness, swelling, or pain. ? More fluid or blood. ? Warmth. ? Pus or a bad smell. Driving  If you received a sedative, do not drive for 24 hours after the procedure.  If you did not receive a sedative, ask your health care provider when it is safe to drive. Activity  Return to your normal activities as told by your health care provider. Ask your health care provider what activities are safe for you.  Until your health care provider says it is safe: ? Do not lift anything that is heavier than 10 lb (4.5 kg). ? Do not do activities that involve lifting your arms over your head. General instructions  Do not use any tobacco products, such as cigarettes, chewing tobacco, and e-cigarettes. Tobacco can delay healing. If you need help quitting, ask your health care provider.  Keep all follow-up visits as told by your health care provider. This is important. Contact a health care provider if:  You have more redness, swelling, or pain around your incision.  You have more fluid or blood coming from your incision.  Your incision feels warm to the touch.  You have pus or a bad smell coming from your incision.  You have a fever.  You have pain that is not relieved by your pain medicine. Get help right away if:  You have chest pain.  You have difficulty breathing. This information is not intended to replace advice given to you by your health care provider. Make sure you discuss any questions you have with your health care provider. Document Released: 01/22/2015 Document Revised: 07/19/2015 Document Reviewed: 11/15/2014 Elsevier Interactive Patient Education  [employer].

## 2017-03-06 NOTE — H&P (Signed)
Chief Complaint: tonsillar cancer  Referring Physician:Dr. Sullivan Lone  Supervising Physician: Markus Daft  Patient Status: [facility] - Out-pt  HPI: Terry Pacheco is a 67 y.o. male with a history of tonsillar cancer who has a port a cath placed on 12-25-16 for treatment. He has completed his treatment and presents back today for removal of his port a cath.  He has no complaints of chest pain, SOB, fevers, chills, dysuria.  He still has some side effects eating after radiation, but otherwise is doing well.    Past Medical History:  Past Medical History:  Diagnosis Date  . Arthritis   . GERD (gastroesophageal reflux disease) 07/06/2014  . Hypertension   . Sleep apnea    does not use CPAP  . Wears glasses     Past Surgical History:  Past Surgical History:  Procedure Laterality Date  . HERNIA REPAIR    . IR CV LINE INJECTION  01/14/2017  . IR FLUORO GUIDE PORT INSERTION RIGHT  12/25/2016  . IR GASTROSTOMY TUBE MOD SED  12/25/2016  . IR US GUIDE VASC ACCESS RIGHT  12/25/2016  . polyp removal  2008   during colonoscopy  . TOTAL HIP ARTHROPLASTY Right 10/16/2015   Procedure: RIGHT TOTAL HIP ARTHROPLASTY ANTERIOR APPROACH;  Surgeon: Paralee Cancel, MD;  Location: [facility];  Service: Orthopedics;  Laterality: Right;  . vocal cord polyp      removed 2-3 years ago     Family History:  Family History  Problem Relation Age of Onset  . Heart disease Mother   . Stroke Father     Social History:  reports that he quit smoking about 3 months ago. His smoking use included cigarettes. He started smoking about 34 years ago. He has a 22.50 pack-year smoking history. he has never used smokeless tobacco. He reports that he drinks alcohol. He reports that he uses drugs. Drug: Marijuana.  Allergies:  Allergies  Allergen Reactions  . Crestor [Rosuvastatin]     Muscle aches    Medications: Medications reviewed in epic  Please HPI for pertinent positives, otherwise complete 10 system ROS  negative.  Mallampati Score: MD Evaluation Airway: WNL Heart: WNL Abdomen: WNL Chest/ Lungs: WNL Chest/ lungs comments: port in right upper chest ASA  Classification: 2 Mallampati/Airway Score: Three  Physical Exam: BP 103/74 (BP Location: Right Arm)   Pulse 87   Temp 97.9 F (36.6 C) (Oral)   Resp 18   SpO2 98%  There is no height or weight on file to calculate BMI. General: pleasant, WD, WN white male who is laying in bed in NAD HEENT: head is normocephalic, atraumatic.  Sclera are noninjected.  PERRL.  Ears and nose without any masses or lesions.  Mouth is pink and moist Heart: regular, rate, and rhythm.  Normal s1,s2. No obvious murmurs, gallops, or rubs noted.  Palpable radial and pedal pulses bilaterally Lungs: CTAB, no wheezes, rhonchi, or rales noted.  Respiratory effort nonlabored.  Port in [place] Abd: soft, NT, ND, +BS, no masses, hernias, or organomegaly Psych: A&Ox3 with an appropriate affect.   Labs: Results for orders placed or performed during the hospital encounter of 03/06/17 (from the past 48 hour(s))  CBC with Differential/Platelet     Status: Abnormal   Collection Time: 03/06/17 11:53 AM  Result Value Ref Range   WBC 4.8 4.0 - 10.5 K/uL   RBC 3.93 (L) 4.22 - 5.81 MIL/uL   Hemoglobin 12.3 (L) 13.0 - 17.0 g/dL   HCT  36.2 (L) 39.0 - 52.0 %   MCV 92.1 78.0 - 100.0 fL   MCH 31.3 26.0 - 34.0 pg   MCHC 34.0 30.0 - 36.0 g/dL   RDW 16.2 (H) 11.5 - 15.5 %   Platelets 134 (L) 150 - 400 K/uL   Neutrophils Relative % 59 %   Neutro Abs 2.9 1.7 - 7.7 K/uL   Lymphocytes Relative 22 %   Lymphs Abs 1.0 0.7 - 4.0 K/uL   Monocytes Relative 11 %   Monocytes Absolute 0.5 0.1 - 1.0 K/uL   Eosinophils Relative 8 %   Eosinophils Absolute 0.4 0.0 - 0.7 K/uL   Basophils Relative 0 %   Basophils Absolute 0.0 0.0 - 0.1 K/uL  Protime-INR     Status: None   Collection Time: 03/06/17 11:53 AM  Result Value Ref Range   Prothrombin Time 13.2 11.4 - 15.2 seconds   INR 1.01      Imaging: No results found.  Assessment/Plan 1. Tonsillar cancer with port a cath  Plan to remove PAC today.  Labs and vitals have been reviewed. The procedure along with risks and complications including, but not limited to bleeding and infection were discussed.  The patient is agreeable to proceed. Consent has been signed.  Thank you for this interesting consult.  I greatly enjoyed meeting Terry Pacheco and look forward to participating in their care.  A copy of this report was sent to the requesting provider on this date.  Electronically Signed: Henreitta Cea 03/06/2017, 1:07 PM   I spent a total of    25 Minutes in face to face in clinical consultation, greater than 50% of which was counseling/coordinating care for tonsillar cancer

## 2017-03-08 ENCOUNTER — Other Ambulatory Visit: Payer: Self-pay | Admitting: Family Medicine

## 2017-03-09 ENCOUNTER — Ambulatory Visit: Payer: 59

## 2017-03-10 ENCOUNTER — Ambulatory Visit: Payer: 59

## 2017-03-10 ENCOUNTER — Inpatient Hospital Stay: Payer: 59

## 2017-03-10 NOTE — Progress Notes (Signed)
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 Patient has completed radiation and chemotherapy treatment on 02/18/17.    Patient has been drinking boost plus and eating foods orally (soups, hamburger, eggs).  Reports food have a "bad" taste. Has been using feeding tube on occasion maybe 2 cartons of osmolite 1.5 via tube per day but not consistently.  Reports some nausea and has started taking nausea medication again with relief.  Also reports some diarrhea but has reduced amount of pain medication but still was taking stool softner which wife thinks is why patient was having diarrhea.  Has since stopped taking stool softner.  Wife reports patient has been averaging only about 1200 calories per day.    Medications: reviewed  Labs: reviewed  Anthropometrics:   Measured patient today in RD office at 185 lb 4 oz decreased from 189 lb 4.8 oz on 1/8 and decreased from 196 lb 1.6 oz on 12/28.     Estimated Energy Needs  Kcals: 2200-2400 calories/d Protein: 115-125 g/d Fluid: 2.4  NUTRITION DIAGNOSIS: Inadequate oral intake continues    INTERVENTION:  Encouraged patient to eat soft, moist foods at meal times (breakfast, lunch and dinner).  Discussed foods to try and ways to add calories and protein Encouraged patient to provide osmolite 1.5 between meals 3 times per day to provide more calories and protein during recovery time period and prevent further weight loss.   Encouraged patient to utilize nausea medication to help relieve symptoms and allow patient to eat.      MONITORING, EVALUATION, GOAL: Patient will work to increase oral intake to minimize further weight loss and promote healing.   NEXT VISIT: Feb  19 at noon  Denica Web B. Zenia Resides, [place], [place] Registered Dietitian [phone] (pager)

## 2017-03-15 ENCOUNTER — Other Ambulatory Visit: Payer: Self-pay | Admitting: Family Medicine

## 2017-03-16 ENCOUNTER — Ambulatory Visit: Payer: 59 | Attending: Radiation Oncology

## 2017-03-16 ENCOUNTER — Other Ambulatory Visit: Payer: Self-pay | Admitting: Hematology

## 2017-03-16 ENCOUNTER — Other Ambulatory Visit: Payer: Self-pay | Admitting: *Deleted

## 2017-03-16 ENCOUNTER — Encounter: Payer: Self-pay | Admitting: *Deleted

## 2017-03-16 ENCOUNTER — Telehealth: Payer: Self-pay | Admitting: *Deleted

## 2017-03-16 DIAGNOSIS — I890 Lymphedema, not elsewhere classified: Secondary | ICD-10-CM | POA: Diagnosis present

## 2017-03-16 DIAGNOSIS — R1310 Dysphagia, unspecified: Secondary | ICD-10-CM

## 2017-03-16 DIAGNOSIS — C090 Malignant neoplasm of tonsillar fossa: Secondary | ICD-10-CM

## 2017-03-16 DIAGNOSIS — C099 Malignant neoplasm of tonsil, unspecified: Secondary | ICD-10-CM

## 2017-03-16 DIAGNOSIS — R293 Abnormal posture: Secondary | ICD-10-CM | POA: Insufficient documentation

## 2017-03-16 NOTE — Telephone Encounter (Addendum)
Oncology Nurse Navigator Documentation  Rec'd call from patient's wife who reported new onset of swelling under neck over the weekend.  She denied issues with breathing or swallowing.  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  She voiced appreciation.   Gayleen Orem, RN, BSN Head & Neck Oncology Nurse [place] at [place] [phone]

## 2017-03-16 NOTE — Telephone Encounter (Signed)
10/22/16  Dr Livia Snellen

## 2017-03-16 NOTE — Progress Notes (Signed)
Oncology Nurse Navigator Documentation  Met with pt and his wife beginning of appt with SLP Garald Balding in follow-up to wife's call this morning.  Mr. Kaminsky presented with fluid retention in front of neck characteristic of post-RT lymphedema.  Area soft and mobile to palpation.  I provided contact number for PT Serafina Royals to arrange appt.  Mr. Seeman reported frequent episodes of nausea accompanied by occasional "dry heaves" occurring over the past couple of days.  He acknowledged ongoing production of thick saliva to which he attributed some of the nausea.  He noted also correlation of nausea with instillation of a full bottle of nutritional supplement.    I suggested 1) taking antiemetics as prescribed, particularly prior to feedings, 2) increased mouth rinses with salt/baking soda solution, particularly in the morning when first awakens and prior to any oral intake, and 3) instillation of smaller and more frequent volumes of supplement.    I encouraged them to call me in the next day or so if nausea has not resolved, noting appt with Symptom Mgt can be arranged. They voiced understanding of information provided.  Gayleen Orem, RN, BSN Head & Neck Oncology Nurse [place] at [place] [phone]

## 2017-03-16 NOTE — Therapy (Signed)
[facility] [address] [place], [place], [address] Phone: [phone]   Fax:  [phone]  Speech Language Pathology Treatment  Patient Details  Name: Terry Pacheco MRN: [id number] Date of Birth: 24-Jul-1950 Referring Provider: Eppie Gibson, MD   Encounter Date: 03/16/2017  End of Session - 03/16/17 1313    Visit Number  3    Number of Visits  5    Date for SLP Re-Evaluation  05/22/17    SLP Start Time  79    SLP Stop Time   1053    SLP Time Calculation (min)  35 min    Activity Tolerance  Patient tolerated treatment well       Past Medical History:  Diagnosis Date  . Arthritis   . GERD (gastroesophageal reflux disease) 07/06/2014  . Hypertension   . Sleep apnea    does not use CPAP  . Wears glasses     Past Surgical History:  Procedure Laterality Date  . HERNIA REPAIR    . IR CV LINE INJECTION  01/14/2017  . IR FLUORO GUIDE PORT INSERTION RIGHT  12/25/2016  . IR GASTROSTOMY TUBE MOD SED  12/25/2016  . IR REMOVAL TUN ACCESS W/ PORT W/O FL MOD SED  03/06/2017  . IR US GUIDE VASC ACCESS RIGHT  12/25/2016  . polyp removal  2008   during colonoscopy  . TOTAL HIP ARTHROPLASTY Right 10/16/2015   Procedure: RIGHT TOTAL HIP ARTHROPLASTY ANTERIOR APPROACH;  Surgeon: Paralee Cancel, MD;  Location: [facility];  Service: Orthopedics;  Laterality: Right;  . vocal cord polyp      removed 2-3 years ago     There were no vitals filed for this visit.  Subjective Assessment - 03/16/17 1037    Subjective  Pt with dysgeusia/ageusia, decr'd appetite. Nauseous daily, with meds.    Patient is accompained by:  -- wife    Currently in Pain?  No/denies            ADULT SLP TREATMENT - 03/16/17 1038      General Information   Behavior/Cognition  Alert;Cooperative;Pleasant mood      Treatment Provided   Treatment provided  Dysphagia      Dysphagia Treatment   Temperature Spikes Noted  No    Respiratory Status  Room air     Treatment Methods  Skilled observation;Therapeutic exercise;Patient/caregiver education    Patient observed directly with PO's  Yes    Type of PO's observed  Dysphagia 3 (soft);Thin liquids    Oral Phase Signs & Symptoms  -- none noted    Pharyngeal Phase Signs & Symptoms  -- none noted    Other treatment/comments  Pt eating very little PO due to nausea, ageusia/dysgeusia. "I'm pushing through." Doing 4x/day tube feeds. SLP encouraged pt to cont to eat PO; educated him and wife re: food journal. Pt has remained mostly compliant with HEP , completed full regimen of exercises once/day - SLP reminded to afford pt best possible outcomes he needed to begin to add exercises back in to complete HEP BID. Pt with SBA needed for HEP.       Assessment / Recommendations / Plan   Plan  Continue with current plan of care      Dysphagia Recommendations   Diet recommendations  -- as tolerated    Medication Administration  -- as tolerated      Progression Toward Goals   Progression toward goals  Progressing toward goals  SLP Education - 03/16/17 1248    Education provided  Yes    Education Details  overt s/s aspiration PNA, need HEP BID, food journal    Person(s) Educated  Patient;Spouse    Methods  Explanation;Handout    Comprehension  Verbalized understanding       SLP Short Term Goals - 03/16/17 1311      SLP SHORT TERM GOAL #1   Title  pt will complete HEP with rare min A     Status  Achieved      SLP SHORT TERM GOAL #2   Title  pt will tell SLP why he is completing HEP     Status  Achieved      SLP SHORT TERM GOAL #3   Title  pt will tell SLP 3 overt s/s aspiration PNA with modified independence    Status  Achieved       SLP Long Term Goals - 03/16/17 1256      SLP LONG TERM GOAL #1   Title  pt will tell SLP why food journal can be helpful in returning to least restrictive food consistencies    Status  Achieved      SLP LONG TERM GOAL #2   Title  pt will complete HEP  with modified independence over three sessions    Time  2    Period  -- visits    Status  Revised      SLP LONG TERM GOAL #3   Title  pt will tell SLP when HEP frequency can be reduced to x2-3/week    Time  2    Period  -- vists    Status  On-going       Plan - 03/16/17 1312    Clinical Impression Statement  Pt with oropharyngeal swallowing WFL for dys III/thin, however the probability of swallowing difficulty remains and heightens after chemo and radiation therapy. Pt will need to cont to be followed by SLP for regular assessment of accurate HEP completion as well as for safety with POs both during and following treatment/s, likely 1-2 more sessions.    Speech Therapy Frequency  -- approx once every 4 weeks    Duration  -- 5 total visits    Treatment/Interventions  Aspiration precaution training;Pharyngeal strengthening exercises;Diet toleration management by SLP;Environmental controls;Compensatory techniques;Cueing hierarchy;SLP instruction and feedback;Multimodal communcation approach;Internal/external aids;Trials of upgraded texture/liquids;Patient/family education any or all will be used    Potential to Achieve Goals  Good    SLP Home Exercise Plan  provided today    Consulted and Agree with Plan of Care  Patient       Patient will benefit from skilled therapeutic intervention in order to improve the following deficits and impairments:   Dysphagia, unspecified type - Plan: SLP plan of care cert/re-cert    Problem List Patient Active Problem List   Diagnosis Date Noted  . Counseling regarding advanced care planning and goals of care 12/22/2016  . Carcinoma of tonsillar fossa ([place]) 12/17/2016  . 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 Osteoarthritis of right hip 04/05/2015  . GAD (generalized anxiety disorder) 04/05/2015  . Metabolic syndrome 51/03/5850  . Obese 04/05/2015  . GERD (gastroesophageal reflux disease) 07/06/2014  . Hypertension 03/14/2013  .  Hyperlipidemia with target LDL less than 100 03/14/2013    Roosevelt Bisher ,MS, CCC-SLP  03/16/2017, 1:16 PM  [facility] [address] [place], [place], [address] Phone: [phone]  Fax:  [phone]   Name: SEBASTIEN JACKSON MRN: [id number] Date of Birth: 08-Jan-1951

## 2017-03-17 ENCOUNTER — Other Ambulatory Visit (INDEPENDENT_AMBULATORY_CARE_PROVIDER_SITE_OTHER): Payer: Self-pay

## 2017-03-17 DIAGNOSIS — C090 Malignant neoplasm of tonsillar fossa: Secondary | ICD-10-CM

## 2017-03-17 MED FILL — PROCHLORPERAZINE 10 MG TAB: 10 | 7 days supply | Qty: 30 | Fill #1

## 2017-03-17 MED FILL — SF 1.1% GEL: 1.1 | 30 days supply | Qty: 112 | Fill #0

## 2017-03-17 MED FILL — LIDOCAINE 2% VISCOUS SOLN: 2 | 3 days supply | Qty: 100 | Fill #2

## 2017-03-18 ENCOUNTER — Other Ambulatory Visit: Payer: Self-pay

## 2017-03-18 ENCOUNTER — Encounter: Payer: Self-pay | Admitting: Physical Therapy

## 2017-03-18 ENCOUNTER — Encounter: Payer: Self-pay | Admitting: Radiation Oncology

## 2017-03-18 ENCOUNTER — Ambulatory Visit: Payer: 59 | Admitting: Physical Therapy

## 2017-03-18 DIAGNOSIS — R293 Abnormal posture: Secondary | ICD-10-CM

## 2017-03-18 DIAGNOSIS — I890 Lymphedema, not elsewhere classified: Secondary | ICD-10-CM

## 2017-03-18 DIAGNOSIS — R1310 Dysphagia, unspecified: Secondary | ICD-10-CM | POA: Diagnosis not present

## 2017-03-18 MED FILL — ONDANSETRON HCL 8 MG TAB: 8 | 15 days supply | Qty: 30 | Fill #0

## 2017-03-18 NOTE — Progress Notes (Signed)
Mr. Uselman presents for follow up of radiation completed 02/18/17 to his Right Tonsil and bilateral neck.   Pain issues, if any: He reports soreness to back right of mouth which continues after treatment, where cancer originally was found Using a feeding tube?: Yes, he instills 4 cans of osmolite daily.  Weight changes, if any:  Wt Readings from Last 3 Encounters:  03/24/17 187 lb 6.4 oz (85 kg)  03/10/17 185 lb 4 oz (84 kg)  03/03/17 189 lb 4.8 oz (85.9 kg)   Swallowing issues, if any: He denies difficulty swallowing foods related to texture. He does need to follow food with water. He does report a decreased appetite, and tells me that he really is not eating much at all. He continues with taste changes.  Smoking or chewing tobacco? No Using fluoride trays daily? Yes daily at night.  Last ENT visit was on: Dr. Erik Obey, not since diagnosis.  Other notable issues, if any:  He tells me that his tongue continues to have numbness to the right of his tongue.  03/18/17 Evaluation for lymphedema 04/13/17 Garald Balding ST 04/14/17 Jennet Maduro, Nutrition.  He has an appointment with his regular dentist and several weeks and wants to know if he should go?   BP 110/88   Pulse 91   Temp 98.2 F (36.8 C)   Ht 5\' 9"  (1.753 m)   Wt 187 lb 6.4 oz (85 kg)   SpO2 96% Comment: room air  BMI 27.67 kg/m

## 2017-03-18 NOTE — Therapy (Addendum)
[place], [place], [address] Phone: [phone]   Fax:  [phone]  Physical Therapy Evaluation  Patient Details  Name: Terry Pacheco MRN: [id number] Date of Birth: 16-Aug-1950 Referring Provider: Dr. Isidore Moos   Encounter Date: 03/18/2017  PT End of Session - 03/18/17 1429    Visit Number  1    Number of Visits  9    Date for PT Re-Evaluation  04/15/17    PT Start Time  1350    PT Stop Time  1425    PT Time Calculation (min)  35 min    Activity Tolerance  Patient tolerated treatment well    Behavior During Therapy  [facility] for tasks assessed/performed       Past Medical History:  Diagnosis Date  . Arthritis   . GERD (gastroesophageal reflux disease) 07/06/2014  . History of radiation therapy 12/30/2016- 02/18/2017   Right Tonsil and bilateral neck/ 70 Gy in 35 fractions to gross diseasae, 63 gy in 35 fractions to high risk nodal echelons, and 56 Gy in 35 fraction to intermediate risk nodal echelons.   . Hypertension   . Sleep apnea    does not use CPAP  . Wears glasses     Past Surgical History:  Procedure Laterality Date  . HERNIA REPAIR    . IR CV LINE INJECTION  01/14/2017  . IR FLUORO GUIDE PORT INSERTION RIGHT  12/25/2016  . IR GASTROSTOMY TUBE MOD SED  12/25/2016  . IR REMOVAL TUN ACCESS W/ PORT W/O FL MOD SED  03/06/2017  . IR US GUIDE VASC ACCESS RIGHT  12/25/2016  . polyp removal  2008   during colonoscopy  . TOTAL HIP ARTHROPLASTY Right 10/16/2015   Procedure: RIGHT TOTAL HIP ARTHROPLASTY ANTERIOR APPROACH;  Surgeon: Paralee Cancel, MD;  Location: [facility];  Service: Orthopedics;  Laterality: Right;  . vocal cord polyp      removed 2-3 years ago     There were no vitals filed for this visit.   Subjective Assessment - 03/18/17 1353    Subjective  My throat just started swelling on Friday Jan 18th. I completed my radiation and chemotherapy. I finished my radiation on Dec 26th 2018. My throat  feels heavy and when I look in the mirror it bothers me.     Pertinent History  CT of neck on 11/17/16 showed 7 x 9 mm "cyst" in the right lateral pharyngeal wall. Pt. with right tonsil squamous cell carcinoma, p16 positive.  Pt has completed chemotherapy and radiation, hx of right THA 2017, HTN    Patient Stated Goals  to get the swelling down    Currently in Pain?  No/denies    Pain Score  0-No pain         [facility] PT Assessment - 03/18/17 0001      Assessment   Medical Diagnosis  right tonsil squamous cell carcinoma, p16 positive    Referring Provider  Dr. Isidore Moos    Onset Date/Surgical Date  11/17/16    Hand Dominance  Left    Prior Therapy  none      Precautions   Precautions  Other (comment)    Precaution Comments  at risk for lymphedema      Restrictions   Weight Bearing Restrictions  No      Balance Screen   Has the patient fallen in the past 6 months  No    Has the patient had a decrease in activity  level because of a fear of falling?   No    Is the patient reluctant to leave their home because of a fear of falling?   No      Home Film/video editor residence    Living Arrangements  Spouse/significant other    Available Help at Discharge  Family    Type of [place] to enter    Entrance Stairs-Number of Steps  4    Entrance Stairs-Rails  Can reach both    [place]  Two level    Alternate Level Stairs-Number of Steps  13    Alternate Level Stairs-Rails  Can reach both    [place]  None      Prior Function   Level of [place]  Retired    Leisure  pt reports he is more active in the spring and summer working out in the yard      Cognition   Overall Cognitive Status  Within Functional Limits for tasks assessed      Observation/Other Assessments   Observations  anterior neck full, throughout strength testing pt felt fatigued and winded though strength was [facility]      Functional  Tests   Functional tests  Sit to Stand      Sit to Stand   Comments  14 times in 30 seconds      Posture/Postural Control   Posture/Postural Control  Postural limitations    Postural Limitations  Forward head      ROM / Strength   AROM / PROM / Strength  AROM;Strength      AROM   Overall AROM   Within functional limits for tasks performed    Overall AROM Comments  neck and shoulder A/ROM bilat. WFL pt has pain with neck ROM      Strength   Overall Strength  Within functional limits for tasks performed      Ambulation/Gait   Ambulation/Gait  Yes    Ambulation/Gait Assistance  7: Independent per pt. report        LYMPHEDEMA/ONCOLOGY QUESTIONNAIRE - 03/18/17 1408      Type   Cancer Type  Rt. tonsil squamous cell      Treatment   Active Chemotherapy Treatment  No    Past Chemotherapy Treatment  Yes    Active Radiation Treatment  No    Past Radiation Treatment  Yes    Body Site  02/18/17      What other symptoms do you have   Are you Having Heaviness or Tightness  Yes    Are you having Pain  Yes only with neck ROM    Are you having pitting edema  No    Do you have infections  No      Head and Neck   4 cm superior to sternal notch around neck  41.5 cm    6 cm superior to sternal notch around neck  42 cm    8 cm superior to sternal notch around neck  43.5 cm    Other  44.5 cm. at 10 cm. superior to sternal notch    Other  pt reports he lost 24 lbs since Jan 06, 2017 when last measurements were taken          Objective measurements completed on examination: See above findings.      [place] Adult PT Treatment/Exercise - 03/18/17 0001  Manual Therapy   Manual Therapy  Edema management    Edema Management  created foam chip pack for pt to wear at home for compression, educated not to tie tightly          Media Information   Document Information   Photos  Neck swelling  03/18/2017 14:18  Attached To:  Outpatient Rehab on 03/18/17 with Wynelle Beckmann, Melodie Bouillon, PT  Source Information   [facility] [place], [place]  [facility]       PT Education - 03/18/17 1434    Education provided  Yes    Education Details  walking program, wear chip pack as much as possible, do not tie too tightly, anatomy and physiology of lymphatic system    Person(s) Educated  Patient;Spouse    Methods  Explanation    Comprehension  Verbalized understanding          PT Long Term Goals - 03/18/17 1659      PT LONG TERM GOAL #1   Title  Pt will obtain appropriate compression garment for long term management of lymphedema    Time  4    Period  Weeks    Status  New    Target Date  04/15/17      PT LONG TERM GOAL #2   Title  Pt will report 75% improvement in anterior neck swelling to decrease risk of infection    Time  4    Period  Weeks    Status  New    Target Date  04/15/17      PT LONG TERM GOAL #3   Title  Pt will be independent in a home exercise program for neck stretching to help decrease neck pain with neck ROM    Time  4    Period  Weeks    Status  New    Target Date  04/15/17      PT LONG TERM GOAL #4   Title  Pt and/or wife will be independent in self MLD for anterior neck swelling    Time  4    Period  Weeks    Status  New    Target Date  04/15/17         Head and Neck Clinic Goals - 01/06/17 2024      Patient will be able to verbalize understanding of a home exercise program for cervical range of motion, posture, and walking.    Status  Achieved      Patient will be able to verbalize understanding of proper sitting and standing posture.    Status  Achieved      Patient will be able to verbalize understanding of lymphedema risk and availability of treatment for this condition.    Status  Achieved         Plan - 03/18/17 1430    Clinical Impression Statement  Pt presents to PT with anterior neck lymphedema following radiation treatment for right tonsil squamous cell carcinoma. He has  now completed chemo and radiation. His has significant anterior neck swelling that is still soft to the touch and appeared less than a week ago. He has been struggling with increased fatigue and decreased endurance but overall his strength and ROM are WFL. Pt has not had a decline in his 30 sec sit to stands which was last performed prior to his treatments. Pt was educated in a walking program. He would benefit from skilled PT services to decrease anterior neck lymphedema  and assist pt with obtaining appropriate compression garments. Also, pt has pain with neck ROM and would benefit from PT to help decrease this pain to allow improved mobility.     History and Personal Factors relevant to plan of care:  HTN    Clinical Presentation  Stable    Clinical Decision Making  Low    Rehab Potential  Good    Clinical Impairments Affecting Rehab Potential  hx of radiation    PT Frequency  2x / week    PT Duration  4 weeks    PT Treatment/Interventions  ADLs/Self Care Home Management;Therapeutic activities;Therapeutic exercise;Manual techniques;Patient/family education;Manual lymph drainage;Compression bandaging;Passive range of motion;Vasopneumatic Device;Taping    PT Next Visit Plan  begin anterior neck MLD, give handout and teach pt and wife, ask if he has started walking, ask about chip pack    PT Home Exercise Plan  wear chip pack as much as possible    Consulted and Agree with Plan of Care  Patient;Family member/caregiver       Patient will benefit from skilled therapeutic intervention in order to improve the following deficits and impairments:  Decreased endurance, Increased edema, Postural dysfunction, Pain  Visit Diagnosis: Lymphedema, not elsewhere classified - Plan: PT plan of care cert/re-cert  Abnormal posture - Plan: PT plan of care cert/re-cert     Problem List Patient Active Problem List   Diagnosis Date Noted  . Counseling regarding advanced care planning and goals of care  12/22/2016  . Carcinoma of tonsillar fossa ([place]) 12/17/2016  . 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 Osteoarthritis of right hip 04/05/2015  . GAD (generalized anxiety disorder) 04/05/2015  . Metabolic syndrome 81/82/9937  . Obese 04/05/2015  . GERD (gastroesophageal reflux disease) 07/06/2014  . Hypertension 03/14/2013  . Hyperlipidemia with target LDL less than 100 03/14/2013    Allyson Sabal [facility] 03/18/2017, 5:03 PM  [place], [place], [address] Phone: [phone]   Fax:  [phone]  Name: Terry Pacheco MRN: [id number] Date of Birth: 1950/03/04  Manus Gunning, PT 03/18/17 5:03 PM

## 2017-03-18 NOTE — Patient Instructions (Signed)

## 2017-03-24 ENCOUNTER — Encounter: Payer: Self-pay | Admitting: Radiation Oncology

## 2017-03-24 ENCOUNTER — Ambulatory Visit
Admission: RE | Admit: 2017-03-24 | Discharge: 2017-03-24 | Disposition: A | Discharge status: Home / Self Care | Payer: 59 | Source: Ambulatory Visit | Attending: Radiation Oncology | Admitting: Radiation Oncology

## 2017-03-24 VITALS — BP 110/88 | HR 91 | Temp 98.2°F | Ht 69.0 in | Wt 187.4 lb

## 2017-03-24 DIAGNOSIS — Z79899 Other long term (current) drug therapy: Secondary | ICD-10-CM | POA: Insufficient documentation

## 2017-03-24 DIAGNOSIS — R439 Unspecified disturbances of smell and taste: Secondary | ICD-10-CM | POA: Insufficient documentation

## 2017-03-24 DIAGNOSIS — Z931 Gastrostomy status: Secondary | ICD-10-CM | POA: Diagnosis not present

## 2017-03-24 DIAGNOSIS — Z08 Encounter for follow-up examination after completed treatment for malignant neoplasm: Secondary | ICD-10-CM | POA: Insufficient documentation

## 2017-03-24 DIAGNOSIS — R200 Anesthesia of skin: Secondary | ICD-10-CM | POA: Insufficient documentation

## 2017-03-24 DIAGNOSIS — R634 Abnormal weight loss: Secondary | ICD-10-CM

## 2017-03-24 DIAGNOSIS — Z6827 Body mass index (BMI) 27.0-27.9, adult: Secondary | ICD-10-CM | POA: Insufficient documentation

## 2017-03-24 DIAGNOSIS — Z888 Allergy status to other drugs, medicaments and biological substances status: Secondary | ICD-10-CM | POA: Insufficient documentation

## 2017-03-24 DIAGNOSIS — Z923 Personal history of irradiation: Secondary | ICD-10-CM | POA: Insufficient documentation

## 2017-03-24 DIAGNOSIS — C090 Malignant neoplasm of tonsillar fossa: Secondary | ICD-10-CM

## 2017-03-24 DIAGNOSIS — Z8589 Personal history of malignant neoplasm of other organs and systems: Secondary | ICD-10-CM | POA: Insufficient documentation

## 2017-03-24 HISTORY — DX: Personal history of irradiation: Z92.3

## 2017-03-24 NOTE — Progress Notes (Signed)
Radiation Oncology         [phone] ________________________________  Name: Terry Pacheco MRN: [id number]  Date: 03/24/2017  DOB: 04-19-1950  Follow-Up Visit Note  CC: Claretta Fraise, MD  Jodi Marble, MD  Diagnosis and Prior Radiotherapy:       ICD-10-CM   1. Carcinoma of tonsillar fossa ([place]) C09.0    Stage I (cT2, cN1, cM0) Right Tonsillar Squamous Cell Carcinoma     Radiation treatment dates:   12/30/2016 - 02/18/2017  Site/dose:     Right tonsil and bilateral neck / 70 Gy in 35 fractions to gross disease, 63 Gy in 35 fractions to high risk nodal echelons, and 56 Gy in 35 fractions to intermediate risk nodal echelons  CHIEF COMPLAINT:  Here for follow-up and surveillance of his right tonsillar squamous cell carcinoma  Narrative:  The patient returns today for routine follow-up.  He endorses having seen Dr. Enrique Sack but they are not scheduled for f/u.  He reports soreness at the back right of his mouth where cancer was originally found. He takes 4 cans of Osmolite daily via feeding tube. He denies swallowing difficulties but does need to drink water to help with swallowing. He endorses a decreased appetite and continues to endorse taste changes. He endorses swallowing exercises. He endorses fluoride trays daily at night. He has not seen Dr. Erik Obey since diagnosis. Continues to endorse numbness at right tongue which preceded RT. Reports of application of lotion to dry areas.  ALLERGIES:  is allergic to crestor [rosuvastatin].  Meds: Current Outpatient Medications  Medication Sig Dispense Refill  . amLODipine (NORVASC) 10 MG tablet TAKE 1 TABLET EVERY DAY FOR FOR BLOOD PRESSURE 90 tablet 0  . atorvastatin (LIPITOR) 40 MG tablet TAKE 1 TABLET BY MOUTH EVERY DAY 90 tablet 0  . escitalopram (LEXAPRO) 20 MG tablet TAKE 0.5 TABLETS (10 MG TOTAL) BY MOUTH DAILY. 45 tablet 0  . lidocaine-prilocaine (EMLA) cream Apply 1 application as needed topically. 30 g 5  . Multiple Vitamin  (MULTIVITAMIN) tablet Take 1 tablet by mouth daily.    . Nutritional Supplements (FEEDING SUPPLEMENT, OSMOLITE 1.5 CAL,) LIQD Give 1 bottle Osmolite 1.5 via PEG QID with 60 ml free water. Increase to 1.5 bottles QID as tolerated. Increase to goal of  1.5 bottles TID and 2 bottles once daily as tolerated. Give additional 250 mL free water three times daily. 6.5 Bottle 0  . omeprazole (PRILOSEC) 20 MG capsule TAKE 1 CAPSULE BY MOUTH EVERY DAY 90 capsule 0  . ondansetron (ZOFRAN) 8 MG tablet TAKE 1 TABLET BY MOUTH TWICE A DAY AS NEEDED. START 3RD DAY AFTER CHEMO 30 tablet 1  . prochlorperazine (COMPAZINE) 10 MG tablet Take 1 tablet (10 mg total) by mouth every 6 (six) hours as needed (Nausea or vomiting). 30 tablet 1  . sodium fluoride (FLUORISHIELD) 1.1 % GEL dental gel Instill one drop of gel into each tooth space of fluoride tray. Place over teeth for 5 minutes. Remove. Spit out excess. Repeat nightly 120 mL prn  . lidocaine (XYLOCAINE) 2 % solution Mix 1 part 2%viscous lidocaine,1part H2O.Swish and swallow 4mL of this mixture, 83min before meals and at bedtime, up to QID (Patient not taking: Reported on 03/24/2017) 100 mL 5  . magic mouthwash w/lidocaine SOLN Take 5 mLs by mouth 4 (four) times daily as needed for mouth pain. 1 Part viscous lidocaine 2% 1 Part Maalox. 1 Part diphenhydramine 12.5 mg per 5 ml elixir. (Patient not taking: Reported on 03/24/2017) 300  mL 1  . oxyCODONE (OXY IR/ROXICODONE) 5 MG immediate release tablet Take 1-2 tablets (5-10 mg total) by mouth every 4 (four) hours as needed for severe pain. (Patient not taking: Reported on 03/24/2017) 60 tablet 0  . sucralfate (CARAFATE) 1 GM/10ML suspension Take 10 mLs (1 g total) 4 (four) times daily -  with meals and at bedtime by mouth. (Patient not taking: Reported on 03/24/2017) 420 mL 1   No current facility-administered medications for this encounter.     Physical Findings: The patient is in no acute distress. Patient is alert and  oriented. Wt Readings from Last 3 Encounters:  03/24/17 187 lb 6.4 oz (85 kg)  03/10/17 185 lb 4 oz (84 kg)  03/03/17 189 lb 4.8 oz (85.9 kg)    height is 5\' 9"  (1.753 m) and weight is 187 lb 6.4 oz (85 kg). His temperature is 98.2 F (36.8 C). His blood pressure is 110/88 and his pulse is 91. His oxygen saturation is 96%. .  General: Alert and oriented, in no acute distress HEENT: Head is normocephalic. Extraocular movements are intact. Skin healed relatively well from radiation. Notable for residual dryness. I appreciated a little more fullness in the right tonsil than the left but no sign of tumor. Also noted was a little swelling in right buccal mucosa that extends over 2 cm but this does not appear cancerous.   Mucous membranes are dry. No palpable lesions in floor of mouth or oral tongue.  Neck: Neck is notable for no palpable masses. Skin: Skin in treatment fields shows satisfactory healing  Psychiatric: Judgment and insight are intact. Affect is appropriate.   Lab Findings: Lab Results  Component Value Date   WBC 4.8 03/06/2017   HGB 12.3 (L) 03/06/2017   HCT 36.2 (L) 03/06/2017   MCV 92.1 03/06/2017   PLT 134 (L) 03/06/2017    Lab Results  Component Value Date   TSH 2.099 12/30/2016    Radiographic Findings: Ir Removal Tun Access W/ Port W/o Fl  Result Date: 03/06/2017 INDICATION: 67 year old with history of tonsillar cancer and completed therapy. Port-A-Cath is no longer needed. I personally placed the Port-A-Cath on 12/25/2016. Reportedly, there were issues using the Port-A-Cath due to swelling in the right neck. Previous port injection demonstrated normal function. EXAM: REMOVAL RIGHT IJ VEIN PORT-A-CATH MEDICATIONS: Ancef 2 g; The antibiotic was administered within an appropriate time interval prior to skin puncture. ANESTHESIA/SEDATION: Moderate (conscious) sedation was employed during this procedure. A total of Versed 2.0 mg and Fentanyl 100 mcg was administered  intravenously. Moderate Sedation Time: 17 minutes. The patient's level of consciousness and vital signs were monitored continuously by radiology nursing throughout the procedure under my direct supervision. FLUOROSCOPY TIME:  None COMPLICATIONS: None immediate. PROCEDURE: Informed written consent was obtained from the patient after a thorough discussion of the procedural risks, benefits and alternatives. All questions were addressed. Maximal Sterile Barrier Technique was utilized including caps, mask, sterile gowns, sterile gloves, sterile drape, hand hygiene and skin antiseptic. A timeout was performed prior to the initiation of the procedure. The right chest was prepped and draped in a sterile fashion. Lidocaine was utilized for local anesthesia. An incision was made over the previously healed surgical incision. Utilizing blunt dissection, the port catheter and reservoir were removed from the underlying subcutaneous tissue in their entirety. The pocket was irrigated with a copious amount of sterile normal saline. The subcutaneous tissue was closed with 3-0 Vicryl interrupted subcutaneous stitches. A 4-0 Vicryl running subcuticular stitch  was utilized to approximate the skin. Dermabond was applied. The entire Port-A-Cath was removed. Normal appearance of the Port-A-Cath without discontinuity. IMPRESSION: Successful right IJ vein Port-A-Cath explant. Electronically Signed   By: Markus Daft M.D.   On: 03/06/2017 14:49    Impression/Plan:    1) Head and Neck Cancer Status: No evidence of disease. Patient will call Dr. Erik Obey regarding a followup in the next month, to resume physical exams with ENT.  The patient is scheduled for f/u in medical oncology with Dr. Irene Limbo on March 12.  2) Nutritional Status: Weight loss seems to have reached a plateau but will retain PEG tube for now.    3) Risk Factors: The patient has been educated about risk factors including alcohol and tobacco abuse; they understand that  avoidance of alcohol and tobacco is important to prevent recurrences as well as other cancers  4) Swallowing: Stable. Continue swallowing exercises.  5) Dental: Encouraged to continue regular followup with dentistry, and dental hygiene including fluoride rinses. They will call Dr. Enrique Sack regarding potential follow up.  6) Thyroid function:  Lab Results  Component Value Date   TSH 2.099 12/30/2016    7) Other: I suggested the patient consider the [employer] program with the [facility].  8) Follow-up in early April with me after restaging PET scan. The patient was encouraged to call with any issues or questions before then.    Eppie Gibson, MD  This document serves as a record of services personally performed by Eppie Gibson, MD. It was created on his behalf by Linward Natal, a trained medical scribe. The creation of this record is based on the scribe's personal observations and the provider's statements to them. This document has been checked and approved by the attending provider.

## 2017-03-25 ENCOUNTER — Ambulatory Visit: Payer: Self-pay | Admitting: Radiation Oncology

## 2017-03-25 ENCOUNTER — Ambulatory Visit: Payer: 59 | Admitting: Physical Therapy

## 2017-03-25 ENCOUNTER — Encounter: Payer: Self-pay | Admitting: Physical Therapy

## 2017-03-25 DIAGNOSIS — I890 Lymphedema, not elsewhere classified: Secondary | ICD-10-CM

## 2017-03-25 DIAGNOSIS — R1310 Dysphagia, unspecified: Secondary | ICD-10-CM | POA: Diagnosis not present

## 2017-03-25 NOTE — Patient Instructions (Signed)
Manual lymph drainage for the neck, anterior approach  Sit in front of a mirror. Do 5 slow deep breaths, breathing in through the nose and out through the mouth, letting your belly "inflate" as you breathe in.  Rest your hands on your abdomen as you do this to give slight pressure there.  1) Place hands on areas just behind collar bones and do 10 stationary circles with stretch in outward directions. 2) Do stationary circles at each armpit about 10 times. 3) Place one hand on the front of the opposite shoulder and do stationary circles with stretch downward toward underarm. 4) Repeat #1 above. 5) Imagine a river running in a line from the earlobe straight down the neck.  Place hands just behind this and do circles with stretch coming forward slightly and down, thinking about fluid flowing down that river.  Do 10 times. 6) Place one hand just in front of the river on one side and do circles with a slight back and then downward stretch, thinking again about putting that fluid in the river.  Do 10-20 times on each side. 7) Place one hand just slightly in front of the spot you just did and do the same thing.  DO THIS VERY GENTLY. 8) Use the webspace between your thumb and index finger to pump downward starting just under the chin and "stair stepping" downward with a stretch, working down to the chest. 9) Repeat #1. 10) Do stationary circles on each side of the face just above the chin, out and down with the stretch 10 times. 11) Do stationary circles on each side of the face on the cheeks with pressure going back and down, 10 times. 12) Do stationary circles on each side of the face between the eyes and ears, again back and downward 10 times. 13) Repeat steps 9,8,7,6,5,1,3 and 2 in that order!  Do not slide on the skin, but STRETCH it with your motions. Only give enough pressure to stretch the skin. DO THIS SLOWLY, PLEASE!  And do once a day.  Cancer Rehab [phone]  

## 2017-03-25 NOTE — Therapy (Signed)
[place], [place], [address] Phone: [phone]   Fax:  [phone]  Physical Therapy Treatment  Patient Details  Name: Terry Pacheco MRN: [id number] Date of Birth: April 15, 1950 Referring Provider: Dr. Isidore Moos   Encounter Date: 03/25/2017  PT End of Session - 03/25/17 1648    Visit Number  2    Number of Visits  9    Date for PT Re-Evaluation  04/15/17    PT Start Time  1696    PT Stop Time  1645    PT Time Calculation (min)  39 min    Activity Tolerance  Patient tolerated treatment well    Behavior During Therapy  [facility] for tasks assessed/performed       Past Medical History:  Diagnosis Date  . Arthritis   . GERD (gastroesophageal reflux disease) 07/06/2014  . History of radiation therapy 12/30/2016- 02/18/2017   Right Tonsil and bilateral neck/ 70 Gy in 35 fractions to gross diseasae, 63 gy in 35 fractions to high risk nodal echelons, and 56 Gy in 35 fraction to intermediate risk nodal echelons.   . Hypertension   . Sleep apnea    does not use CPAP  . Wears glasses     Past Surgical History:  Procedure Laterality Date  . HERNIA REPAIR    . IR CV LINE INJECTION  01/14/2017  . IR FLUORO GUIDE PORT INSERTION RIGHT  12/25/2016  . IR GASTROSTOMY TUBE MOD SED  12/25/2016  . IR REMOVAL TUN ACCESS W/ PORT W/O FL MOD SED  03/06/2017  . IR US GUIDE VASC ACCESS RIGHT  12/25/2016  . polyp removal  2008   during colonoscopy  . TOTAL HIP ARTHROPLASTY Right 10/16/2015   Procedure: RIGHT TOTAL HIP ARTHROPLASTY ANTERIOR APPROACH;  Surgeon: Paralee Cancel, MD;  Location: [facility];  Service: Orthopedics;  Laterality: Right;  . vocal cord polyp      removed 2-3 years ago     There were no vitals filed for this visit.  Subjective Assessment - 03/25/17 1608    Subjective  I think the chip pack is helping. I forgot all about it yesterday and today but the other day I wore it 2-3 hours.     Pertinent History  CT of neck  on 11/17/16 showed 7 x 9 mm "cyst" in the right lateral pharyngeal wall. Pt. with right tonsil squamous cell carcinoma, p16 positive.  Pt has completed chemotherapy and radiation, hx of right THA 2017, HTN    Patient Stated Goals  to get the swelling down    Currently in Pain?  No/denies    Pain Score  0-No pain                      [facility] Adult PT Treatment/Exercise - 03/25/17 0001      Manual Therapy   Manual Therapy  Manual Lymphatic Drainage (MLD)    Manual Lymphatic Drainage (MLD)  Instructed pt and wife in the following and issued handout: short neck, axillary nodes, shoulder collectors, posterior neck, lateral neck and anterior neck then retracing pathways finishing with short neck and axillary nodes noticable increase in wrinkles and decrease in edema                  PT Long Term Goals - 03/18/17 1659      PT LONG TERM GOAL #1   Title  Pt will obtain appropriate compression garment for long term management of  lymphedema    Time  4    Period  Weeks    Status  New    Target Date  04/15/17      PT LONG TERM GOAL #2   Title  Pt will report 75% improvement in anterior neck swelling to decrease risk of infection    Time  4    Period  Weeks    Status  New    Target Date  04/15/17      PT LONG TERM GOAL #3   Title  Pt will be independent in a home exercise program for neck stretching to help decrease neck pain with neck ROM    Time  4    Period  Weeks    Status  New    Target Date  04/15/17      PT LONG TERM GOAL #4   Title  Pt and/or wife will be independent in self MLD for anterior neck swelling    Time  4    Period  Weeks    Status  New    Target Date  04/15/17         Head and Neck Clinic Goals - 01/06/17 2024      Patient will be able to verbalize understanding of a home exercise program for cervical range of motion, posture, and walking.    Status  Achieved      Patient will be able to verbalize understanding of proper sitting  and standing posture.    Status  Achieved      Patient will be able to verbalize understanding of lymphedema risk and availability of treatment for this condition.    Status  Achieved        Plan - 03/25/17 1648    Clinical Impression Statement  Educated pt and wife in self MLD for anterior neck lymphedema. Pt brought his chip pack to wear on his swelling at the end of the session. There was a noticable decrease in swelling following MLD.     Rehab Potential  Good    Clinical Impairments Affecting Rehab Potential  hx of radiation    PT Frequency  2x / week    PT Duration  4 weeks    PT Treatment/Interventions  ADLs/Self Care Home Management;Therapeutic activities;Therapeutic exercise;Manual techniques;Patient/family education;Manual lymph drainage;Compression bandaging;Passive range of motion;Vasopneumatic Device;Taping    PT Next Visit Plan  continue anterior neck MLD, give handout and teach pt and wife, ask if he has started walking, ask about chip pack    PT Home Exercise Plan  wear chip pack as much as possible    Consulted and Agree with Plan of Care  Patient;Family member/caregiver       Patient will benefit from skilled therapeutic intervention in order to improve the following deficits and impairments:  Decreased endurance, Increased edema, Postural dysfunction, Pain  Visit Diagnosis: Lymphedema, not elsewhere classified     Problem List Patient Active Problem List   Diagnosis Date Noted  . Counseling regarding advanced care planning and goals of care 12/22/2016  . Carcinoma of tonsillar fossa ([place]) 12/17/2016  . 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 Osteoarthritis of right hip 04/05/2015  . GAD (generalized anxiety disorder) 04/05/2015  . Metabolic syndrome 58/10/9831  . Obese 04/05/2015  . GERD (gastroesophageal reflux disease) 07/06/2014  . Hypertension 03/14/2013  . Hyperlipidemia with target LDL less than 100 03/14/2013     Blaire Breedlove Blue 03/25/2017, 4:51 PM  [facility]  [place], [place], [address] Phone: [phone]   Fax:  [phone]  Name: Terry Pacheco MRN: [id number] Date of Birth: February 16, 1951  Manus Gunning, PT 03/25/17 4:52 PM

## 2017-03-27 ENCOUNTER — Ambulatory Visit: Payer: 59 | Attending: Radiation Oncology | Admitting: Physical Therapy

## 2017-03-27 ENCOUNTER — Encounter: Payer: Self-pay | Admitting: Physical Therapy

## 2017-03-27 DIAGNOSIS — R1310 Dysphagia, unspecified: Secondary | ICD-10-CM | POA: Diagnosis not present

## 2017-03-27 DIAGNOSIS — I890 Lymphedema, not elsewhere classified: Secondary | ICD-10-CM | POA: Insufficient documentation

## 2017-03-27 DIAGNOSIS — R293 Abnormal posture: Secondary | ICD-10-CM | POA: Diagnosis not present

## 2017-03-27 NOTE — Therapy (Signed)
[place], [place], [address] Phone: [phone]   Fax:  [phone]  Physical Therapy Treatment  Patient Details  Name: Terry Pacheco MRN: [id number] Date of Birth: 1951/01/03 Referring Provider: Dr. Isidore Moos   Encounter Date: 03/27/2017  PT End of Session - 03/27/17 1153    Visit Number  3    Number of Visits  9    Date for PT Re-Evaluation  04/15/17    PT Start Time  1025    PT Stop Time  1105    PT Time Calculation (min)  40 min    Activity Tolerance  Patient tolerated treatment well    Behavior During Therapy  [facility] for tasks assessed/performed       Past Medical History:  Diagnosis Date  . Arthritis   . GERD (gastroesophageal reflux disease) 07/06/2014  . History of radiation therapy 12/30/2016- 02/18/2017   Right Tonsil and bilateral neck/ 70 Gy in 35 fractions to gross diseasae, 63 gy in 35 fractions to high risk nodal echelons, and 56 Gy in 35 fraction to intermediate risk nodal echelons.   . Hypertension   . Sleep apnea    does not use CPAP  . Wears glasses     Past Surgical History:  Procedure Laterality Date  . HERNIA REPAIR    . IR CV LINE INJECTION  01/14/2017  . IR FLUORO GUIDE PORT INSERTION RIGHT  12/25/2016  . IR GASTROSTOMY TUBE MOD SED  12/25/2016  . IR REMOVAL TUN ACCESS W/ PORT W/O FL MOD SED  03/06/2017  . IR US GUIDE VASC ACCESS RIGHT  12/25/2016  . polyp removal  2008   during colonoscopy  . TOTAL HIP ARTHROPLASTY Right 10/16/2015   Procedure: RIGHT TOTAL HIP ARTHROPLASTY ANTERIOR APPROACH;  Surgeon: Paralee Cancel, MD;  Location: [facility];  Service: Orthopedics;  Laterality: Right;  . vocal cord polyp      removed 2-3 years ago     There were no vitals filed for this visit.  Subjective Assessment - 03/27/17 1027    Subjective  I think my swelling is doing good today.     Pertinent History  CT of neck on 11/17/16 showed 7 x 9 mm "cyst" in the right lateral pharyngeal wall.  Pt. with right tonsil squamous cell carcinoma, p16 positive.  Pt has completed chemotherapy and radiation, hx of right THA 2017, HTN    Patient Stated Goals  to get the swelling down    Currently in Pain?  No/denies    Pain Score  0-No pain                      [facility] Adult PT Treatment/Exercise - 03/27/17 0001      Manual Therapy   Manual Therapy  Manual Lymphatic Drainage (MLD)    Manual Lymphatic Drainage (MLD)  short neck, axillary nodes, shoulder collectors, posterior neck, lateral neck and anterior neck then retracing pathways finishing with short neck and axillary nodes noticable increase in wrinkles and decrease in edema                  PT Long Term Goals - 03/18/17 1659      PT LONG TERM GOAL #1   Title  Pt will obtain appropriate compression garment for long term management of lymphedema    Time  4    Period  Weeks    Status  New    Target Date  04/15/17  PT LONG TERM GOAL #2   Title  Pt will report 75% improvement in anterior neck swelling to decrease risk of infection    Time  4    Period  Weeks    Status  New    Target Date  04/15/17      PT LONG TERM GOAL #3   Title  Pt will be independent in a home exercise program for neck stretching to help decrease neck pain with neck ROM    Time  4    Period  Weeks    Status  New    Target Date  04/15/17      PT LONG TERM GOAL #4   Title  Pt and/or wife will be independent in self MLD for anterior neck swelling    Time  4    Period  Weeks    Status  New    Target Date  04/15/17         Head and Neck Clinic Goals - 01/06/17 2024      Patient will be able to verbalize understanding of a home exercise program for cervical range of motion, posture, and walking.    Status  Achieved      Patient will be able to verbalize understanding of proper sitting and standing posture.    Status  Achieved      Patient will be able to verbalize understanding of lymphedema risk and  availability of treatment for this condition.    Status  Achieved        Plan - 03/27/17 1153    Clinical Impression Statement  Continued with MLD to anterior neck today while pt's wife observed. Pt had significant decrease of edema following session. He brought it compression garment to wear for the next 30 min following his appointmnet.     Rehab Potential  Good    Clinical Impairments Affecting Rehab Potential  hx of radiation    PT Frequency  2x / week    PT Duration  4 weeks    PT Treatment/Interventions  ADLs/Self Care Home Management;Therapeutic activities;Therapeutic exercise;Manual techniques;Patient/family education;Manual lymph drainage;Compression bandaging;Passive range of motion;Vasopneumatic Device;Taping    PT Next Visit Plan  continue anterior neck MLD and educated in proper technique as needed, give neck ROM exercises    PT Home Exercise Plan  wear chip pack as much as possible, MLD 1-2x/day    Consulted and Agree with Plan of Care  Patient       Patient will benefit from skilled therapeutic intervention in order to improve the following deficits and impairments:  Decreased endurance, Increased edema, Postural dysfunction, Pain  Visit Diagnosis: Lymphedema, not elsewhere classified     Problem List Patient Active Problem List   Diagnosis Date Noted  . Counseling regarding advanced care planning and goals of care 12/22/2016  . Carcinoma of tonsillar fossa ([place]) 12/17/2016  . 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 Osteoarthritis of right hip 04/05/2015  . GAD (generalized anxiety disorder) 04/05/2015  . Metabolic syndrome 42/59/5638  . Obese 04/05/2015  . GERD (gastroesophageal reflux disease) 07/06/2014  . Hypertension 03/14/2013  . Hyperlipidemia with target LDL less than 100 03/14/2013    Allyson Sabal [facility] 03/27/2017, 11:55 AM  [place],  [place], [address] Phone: [phone]   Fax:  [phone]  Name: Terry Pacheco MRN: [id number] Date of Birth: Mar 28, 1950  Manus Gunning, PT 03/27/17 11:55 AM

## 2017-03-30 ENCOUNTER — Ambulatory Visit (HOSPITAL_COMMUNITY): Payer: Medicaid - Dental | Admitting: Dentistry

## 2017-03-30 ENCOUNTER — Encounter (HOSPITAL_COMMUNITY): Payer: Self-pay | Admitting: Dentistry

## 2017-03-30 ENCOUNTER — Ambulatory Visit: Payer: 59 | Admitting: Physical Therapy

## 2017-03-30 ENCOUNTER — Encounter: Payer: Self-pay | Admitting: Physical Therapy

## 2017-03-30 VITALS — BP 123/82 | HR 79 | Temp 98.6°F

## 2017-03-30 DIAGNOSIS — I890 Lymphedema, not elsewhere classified: Secondary | ICD-10-CM

## 2017-03-30 DIAGNOSIS — R682 Dry mouth, unspecified: Secondary | ICD-10-CM

## 2017-03-30 DIAGNOSIS — K117 Disturbances of salivary secretion: Secondary | ICD-10-CM

## 2017-03-30 DIAGNOSIS — K1230 Oral mucositis (ulcerative), unspecified: Secondary | ICD-10-CM

## 2017-03-30 DIAGNOSIS — C099 Malignant neoplasm of tonsil, unspecified: Secondary | ICD-10-CM

## 2017-03-30 DIAGNOSIS — Z9221 Personal history of antineoplastic chemotherapy: Secondary | ICD-10-CM

## 2017-03-30 DIAGNOSIS — R432 Parageusia: Secondary | ICD-10-CM

## 2017-03-30 DIAGNOSIS — K1233 Oral mucositis (ulcerative) due to radiation: Secondary | ICD-10-CM

## 2017-03-30 DIAGNOSIS — Z923 Personal history of irradiation: Secondary | ICD-10-CM

## 2017-03-30 DIAGNOSIS — Z5189 Encounter for other specified aftercare: Secondary | ICD-10-CM

## 2017-03-30 DIAGNOSIS — R1310 Dysphagia, unspecified: Secondary | ICD-10-CM | POA: Diagnosis not present

## 2017-03-30 DIAGNOSIS — R293 Abnormal posture: Secondary | ICD-10-CM | POA: Diagnosis not present

## 2017-03-30 NOTE — Therapy (Signed)
[place], [place], [address] Phone: [phone]   Fax:  [phone]  Physical Therapy Treatment  Patient Details  Name: Terry Pacheco MRN: [id number] Date of Birth: [birth year] Referring Provider: Dr. Isidore Moos   Encounter Date: 03/30/2017  PT End of Session - 03/30/17 1528    Visit Number  4    Number of Visits  9    Date for PT Re-Evaluation  04/15/17    PT Start Time  1435    PT Stop Time  1517    PT Time Calculation (min)  42 min    Activity Tolerance  Patient tolerated treatment well    Behavior During Therapy  [facility] for tasks assessed/performed       Past Medical History:  Diagnosis Date  . Arthritis   . GERD (gastroesophageal reflux disease) 07/06/2014  . History of radiation therapy 12/30/2016- 02/18/2017   Right Tonsil and bilateral neck/ 70 Gy in 35 fractions to gross diseasae, 63 gy in 35 fractions to high risk nodal echelons, and 56 Gy in 35 fraction to intermediate risk nodal echelons.   . Hypertension   . Sleep apnea    does not use CPAP  . Wears glasses     Past Surgical History:  Procedure Laterality Date  . HERNIA REPAIR    . IR CV LINE INJECTION  01/14/2017  . IR FLUORO GUIDE PORT INSERTION RIGHT  12/25/2016  . IR GASTROSTOMY TUBE MOD SED  12/25/2016  . IR REMOVAL TUN ACCESS W/ PORT W/O FL MOD SED  03/06/2017  . IR US GUIDE VASC ACCESS RIGHT  12/25/2016  . polyp removal  2008   during colonoscopy  . TOTAL HIP ARTHROPLASTY Right 10/16/2015   Procedure: RIGHT TOTAL HIP ARTHROPLASTY ANTERIOR APPROACH;  Surgeon: Paralee Cancel, MD;  Location: [facility];  Service: Orthopedics;  Laterality: Right;  . vocal cord polyp      removed 2-3 years ago     There were no vitals filed for this visit.  Subjective Assessment - 03/30/17 1437    Subjective  I forgot about my swelling over the weekend.     Pertinent History  CT of neck on 11/17/16 showed 7 x 9 mm "cyst" in the right lateral pharyngeal  wall. Pt. with right tonsil squamous cell carcinoma, p16 positive.  Pt has completed chemotherapy and radiation, hx of right THA 2017, HTN    Patient Stated Goals  to get the swelling down    Currently in Pain?  No/denies    Pain Score  0-No pain                      [facility] Adult PT Treatment/Exercise - 03/30/17 0001      Manual Therapy   Manual Therapy  Manual Lymphatic Drainage (MLD)    Manual Lymphatic Drainage (MLD)  short neck, axillary nodes, shoulder collectors, posterior neck, lateral neck and anterior neck then retracing pathways finishing with short neck and axillary nodes noticable increase in wrinkles and decrease in edema                  PT Long Term Goals - 03/18/17 1659      PT LONG TERM GOAL #1   Title  Pt will obtain appropriate compression garment for long term management of lymphedema    Time  4    Period  Weeks    Status  New    Target Date  04/15/17  PT LONG TERM GOAL #2   Title  Pt will report 75% improvement in anterior neck swelling to decrease risk of infection    Time  4    Period  Weeks    Status  New    Target Date  04/15/17      PT LONG TERM GOAL #3   Title  Pt will be independent in a home exercise program for neck stretching to help decrease neck pain with neck ROM    Time  4    Period  Weeks    Status  New    Target Date  04/15/17      PT LONG TERM GOAL #4   Title  Pt and/or wife will be independent in self MLD for anterior neck swelling    Time  4    Period  Weeks    Status  New    Target Date  04/15/17         Head and Neck Clinic Goals - 01/06/17 2024      Patient will be able to verbalize understanding of a home exercise program for cervical range of motion, posture, and walking.    Status  Achieved      Patient will be able to verbalize understanding of proper sitting and standing posture.    Status  Achieved      Patient will be able to verbalize understanding of lymphedema risk and  availability of treatment for this condition.    Status  Achieved        Plan - 03/30/17 1529    Clinical Impression Statement  Performed MLD today to anterior neck. Pt stated he and his wife did not do the massage over the weekend and he was encouraged to be more compliant with this. He did bring his chip pack to wear at end of session. Will educated pt about available head and neck garments at next session.     Rehab Potential  Good    Clinical Impairments Affecting Rehab Potential  hx of radiation    PT Frequency  2x / week    PT Duration  4 weeks    PT Treatment/Interventions  ADLs/Self Care Home Management;Therapeutic activities;Therapeutic exercise;Manual techniques;Patient/family education;Manual lymph drainage;Compression bandaging;Passive range of motion;Vasopneumatic Device;Taping    PT Next Visit Plan  educate pt in available garments for swelling, continue anterior neck MLD and educated in proper technique as needed, give neck ROM exercises    PT Home Exercise Plan  wear chip pack as much as possible, MLD 1-2x/day    Consulted and Agree with Plan of Care  Patient       Patient will benefit from skilled therapeutic intervention in order to improve the following deficits and impairments:  Decreased endurance, Increased edema, Postural dysfunction, Pain  Visit Diagnosis: Lymphedema, not elsewhere classified     Problem List Patient Active Problem List   Diagnosis Date Noted  . Counseling regarding advanced care planning and goals of care 12/22/2016  . Carcinoma of tonsillar fossa ([place]) 12/17/2016  . 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 Osteoarthritis of right hip 04/05/2015  . GAD (generalized anxiety disorder) 04/05/2015  . Metabolic syndrome 85/63/1497  . Obese 04/05/2015  . GERD (gastroesophageal reflux disease) 07/06/2014  . Hypertension 03/14/2013  . Hyperlipidemia with target LDL less than 100 03/14/2013    Allyson Sabal  [facility] 03/30/2017, 3:31 PM  [place], [place], [address] Phone: [phone]  Fax:  [phone]  Name: Terry Pacheco MRN: [id number] Date of Birth: October 05, 1950  Manus Gunning, PT 03/30/17 3:31 PM

## 2017-03-30 NOTE — Patient Instructions (Addendum)
RECOMMENDATIONS: 1. Brush after meals and at bedtime.  Use fluoride at bedtime. 2. Use trismus exercises as directed. 3. Use Biotene Rinse or salt water/baking soda rinses. 4. Multiple sips of water as needed. 5. Return to Dr. Ephraim Hamburger for exam and cleaning in 2 months.  Terry Pacheco, DDS     RADIATION THERAPY AND DECISIONS REGARDING YOUR TEETH  Xerostomia (dry mouth) Your salivary glands may be in the filed of radiation.  Radiation may include all or part of your saliva glands.  This will cause your saliva to dry up and you will have a dry mouth.  The dry mouth will be for the rest of your life unless your radiation oncologist tells you otherwise.  Your saliva has many functions:  Saliva wets your tongue for speaking.  It coats your teeth and the inside of your mouth for easier movement.  It helps with chewing and swallowing food.  It helps clean away harmful acid and toxic products made by the germs in your mouth, therefore it helps prevent cavities.  It kills some germs in your mouth and helps to prevent gum disease.  It helps to carry flavor to your taste buds.  Once you have lost your saliva you will be at higher risk for tooth decay and gum disease.  What can be done to help improve your mouth when there's not enough saliva:  1.  Your dentist may give a prescription for Salagen.  It will not bring back all of your saliva but may bring back some of it.  Also your saliva may be thick and ropy or white and foamy. It will not feel like it use to feel.  2.  You will need to swish with water every time your mouth feels dry.  YOU CANNOT suck on any cough drops, mints, lemon drops, candy, vitamin C or any other products.  You cannot use anything other than water to make your mouth feel less dry.  If you want to drink anything else you have to drink it all at once and brush afterwards.  Be sure to discuss the details of your diet habits with your dentist or  hygienist.  Radiation caries: This is decay that happens very quickly once your mouth is very dry due to radiation therapy.  Normally cavities take six months to two years to become a problem.  When you have dry mouth cavities may take as little as eight weeks to cause you a problem.  This is why dental check ups every two months are necessary as long as you have a dry mouth. Radiation caries typically, but not always, start at your gum line where it is hard to see the cavity.  It is therefore also hard to fill these cavities adequately.  This high rate of cavities happens because your mouth no longer has saliva and therefore the acid made by the germs starts the decay process.  Whenever you eat anything the germs in your mouth change the food into acid.  The acid then burns a small hole in your tooth.  This small hole is the beginning of a cavity.  If this is not treated then it will grow bigger and become a cavity.  The way to avoid this hole getting bigger is to use fluoride every evening as prescribed by your dentist.  You have to make sure that your teeth are very clean before you use the fluoride.  This fluoride in turn will strengthen your teeth and prepare  them for another day of fighting acid.  If you develop radiation caries many times the damage is so large that you will have to have all your teeth removed.  This could be a big problem if some of these teeth are in the field of radiation.  Further details of why this could be a big problem will follow.  (See Osteoradionecrosis).  Loss of taste (dysgeusia) This happens to varying degrees once you've had radiation therapy to your jaw region.  Many times taste is not completely lost but becomes limited.  The loss of taste is mostly due to radiation affecting your taste buds.  However if you have no saliva in your mouth to carry the flavor to your taste buds it would be difficult for your taste buds to taste anything.  That is why using water or a  prescription for Salagen prior to meals and during meals may help with some of the taste.  Keep in mind that taste generally returns very slowly over the course of several months or several years after radiation therapy.  Don't give up hope.  Trismus According to your Radiation Oncologist your TMJ or jaw joints are going to be partially or fully in the field of radiation.  This means that over time the muscles that help you open and close your mouth may get stiff.  This will potentially result in your not being able to open your mouth wide enough or as wide as you can open it now.  Le me give you an example of how slowly this happens and how unaware people are of it.  A gentlemen that had radiation therapy two years ago came back to me complaining that bananas are just too large for him to be able to fit them in between his teeth.  He was not able to open wide enough to bite into a banana.  This happens slowly and over a period of time.  What do we do to try and prevent this?  Your dentist will probably give you a stack of sticks called a trismus exercise device .  This stack will help your remind your muscles and your jaw joint to open up to the same distance every day.  Use these sticks every morning when you wake up according to the instructions given by the dentist.   You must use these sticks for at least one to two years after radiation therapy.  The reason for that is because it happens so slowly and keeps going on for about two years after radiation therapy.  Your hospital dentist will help you monitor your mouth opening and make sure that it's not getting smaller.  Osteoradionecrosis (ORN) This is a condition where your jaw bone after having had radiation therapy becomes very dry.  It has very little blood supply to keep it alive.  If you develop a cavity that turns into an abscess or an infection then the jaw bone does not have enough blood supply to help fight the infection.  At this point it is  very likely that the infection could cause the death of your jaw bone.  When you have dead bone it has to be removed.  Therefore you might end up having to have surgery to remove part of your jaw bone, the part of the jaw bone that has been affected.   Healing is also a problem if you are to have surgery in the areas where the bone has had radiation therapy.  The  same reasons apply.  If you have surgery you need more blood supply which is not available.  When blood supply and oxygen are not available again, there is a chance for the bone to die.  Occasionally ORN happens on its own with no obvious reason.  This is quite rare.  We believe that patients who continue to smoke and/or drink alcohol have a higher chance of having this bone problem.  Therefore once your jaw bone has had radiation therapy if there are any teeth in that area, you should never have them pulled.  You should also never have any surgery on your teeth or gums in that area unless the oral surgeon or Periodontist is aware of your history of radiation. There is some expensive management techniques that might be used to limit your risks.  The risks for ORN either from infection or spontaneous ( or on it's own) are life long.    TRISMUS  Trismus is a condition where the jaw does not allow the mouth to open as wide as it usually does.  This can happen almost suddenly, or in other cases the process is so slow, it is hard to notice it-until it is too far along.  When the jaw joints and/or muscles have been exposed to radiation treatments, the onset of Trismus is very slow.  This is because the muscles are losing their stretching ability over a long period of time, as long as 2 YEARS after the end of radiation.  It is therefore important to exercise these muscles and joints.  TRISMUS EXERCISES   Stack of tongue depressors measuring the same or a little less than the last documented MIO (Maximum Interincisal Opening).  Secure them with a  rubber band on both ends.  Place the stack in the patient's mouth, supporting the other end.  Allow 30 seconds for muscle stretching.  Rest for a few seconds.  Repeat 3-5 times  For all radiation patients, this exercise is recommended in the mornings and evenings unless otherwise instructed.  The exercise should be done for a period of 2 YEARS after the end of radiation.  MIO should be checked routinely on recall dental visits by the general dentist or the hospital dentist.  The patient is advised to report any changes, soreness, or difficulties encountered when doing the exercises.   FLUORIDE TRAYS PATIENT INSTRUCTIONS    Obtain prescription from the pharmacy.  Don't be surprised if it needs to be ordered.  Be sure to let the pharmacy know when you are close to needing a new refill for them to have it ready for you without interruption of Fluoride use.  The best time to use your Fluoride is before bed time.  You must brush your teeth very well and floss before using the Fluoride in order to get the best use out of the Fluoride treatments.  Place 1 drop of Fluoride gel per tooth in the tray.  Place the tray on your lower teeth and your upper teeth.  Make sure the trays are seated all the way.  Remember, they only fit one way on your teeth.  Insert for 5 full minutes.  At the end of the 5 minutes, take the trays out.  SPIT OUT excess. .  Do NOT rinse your mouth!  Do NOT eat or drink after treatments for at least 30 minutes.  This is why the best time for your treatments is before bedtime.  Clean the inside of your Fluoride trays using  COLD WATER and a toothbrush.  In order to keep your Trays from discoloring and free from odors, soak them overnight in denture cleaners such as Efferdent.  Do not use bleach or non denture products.  Store the trays in a safe dry place AWAY from any heat until your next treatment.  If anything happens to your Fluoride trays, or they  don't fit as well after any dental work, please let us know as soon as possible.

## 2017-03-30 NOTE — Progress Notes (Signed)
03/30/2017  Patient Name:   Terry Pacheco Date of Birth:   06-24-1950 Medical Record Number: [id number]  BP 123/82 (BP Location: Right Arm)   Pulse 79   Temp 98.6 F (37 C)   [name] presents for oral examination after chemoradiation therapy. Patient has completed all radiation treatments from 12/30/16 thru 02/18/17. Patient completed 4 of the 7 weekly chemotherapy treatments.  REVIEW OF CHIEF COMPLAINTS:  DRY MOUTH: Yes HARD TO SWALLOW: no  HURT TO SWALLOW: no TASTE CHANGES: no taste at all SORES IN MOUTH: no TRISMUS: no problems with trismus symptoms WEIGHT: 184 pounds down from initial 210 lbs.  HOME OH REGIMEN:  BRUSHING: twice a day FLOSSING: not flossing. Patient encouraged to floss to maintain oral hygiene. RINSING: using salt water and baking soda rinses. FLUORIDE: use his fluoride trays at night. TRISMUS EXERCISES:  Maximum interincisal opening: 35 mm    DENTAL EXAM:  Oral Hygiene:(PLAQUE): good oral hygiene LOCATION OF MUCOSITIS: generalized erythema. DESCRIPTION OF SALIVA: decreased saliva. Moderate xerostomia. ANY EXPOSED BONE: none noted OTHER WATCHED AREAS: molar areas of tooth numbers 3, 17, 18, and 30 that are near the primary field of radiation therapy but less than 5000 cGy per discussion with Dr. Isidore Moos prior to radiation therapy. DX: Xerostomia, Dysgeusia and Mucositis  RECOMMENDATIONS: 1. Brush after meals and at bedtime.  Use fluoride at bedtime. 2. Use trismus exercises as directed. 3. Use Biotene Rinse or salt water/baking soda rinses. 4. Multiple sips of water as needed. 5. Return to Dr. Ephraim Hamburger for exam and cleaning in 2 months.   Lenn Cal, DDS

## 2017-04-01 ENCOUNTER — Encounter: Payer: Self-pay | Admitting: Physical Therapy

## 2017-04-01 ENCOUNTER — Ambulatory Visit: Payer: 59 | Admitting: Physical Therapy

## 2017-04-01 DIAGNOSIS — I890 Lymphedema, not elsewhere classified: Secondary | ICD-10-CM

## 2017-04-01 DIAGNOSIS — R1310 Dysphagia, unspecified: Secondary | ICD-10-CM | POA: Diagnosis not present

## 2017-04-01 DIAGNOSIS — R293 Abnormal posture: Secondary | ICD-10-CM | POA: Diagnosis not present

## 2017-04-01 NOTE — Patient Instructions (Signed)
Off the Shelf options:  Randolm Idol- www.raineywear.com   -small, medium or large  -submental (jaw line) circumference  -beige, white, or black  -$25-$30  Contour/[place] - [email or web address]   -Chin/neck bandage, Chin Strap  -Small, medium, or large  -Head and neck circumference   -Beige or white  - $20-$25   *Check size chart on website to assure proper size. Ask your therapist if  you need help measuring.*  Custom Garment:  JoviPak - [email or web address]    -options are standard chin strap, extended chin strap, universal neck pad or  peri-auricular neck pad    Need to get appt to be measured at:    [facility]  [place], [place] [address]  [phone]

## 2017-04-01 NOTE — Therapy (Signed)
[place], [place], [address] Phone: [phone]   Fax:  [phone]  Physical Therapy Treatment  Patient Details  Name: Terry Pacheco MRN: [id number] Date of Birth: August 19, 1950 Referring Provider: Dr. Isidore Moos   Encounter Date: 04/01/2017  PT End of Session - 04/01/17 1150    Visit Number  5    Number of Visits  9    Date for PT Re-Evaluation  04/15/17    PT Start Time  1103    PT Stop Time  1145    PT Time Calculation (min)  42 min    Activity Tolerance  Patient tolerated treatment well    Behavior During Therapy  [facility] for tasks assessed/performed       Past Medical History:  Diagnosis Date  . Arthritis   . GERD (gastroesophageal reflux disease) 07/06/2014  . History of radiation therapy 12/30/2016- 02/18/2017   Right Tonsil and bilateral neck/ 70 Gy in 35 fractions to gross diseasae, 63 gy in 35 fractions to high risk nodal echelons, and 56 Gy in 35 fraction to intermediate risk nodal echelons.   . Hypertension   . Sleep apnea    does not use CPAP  . Wears glasses     Past Surgical History:  Procedure Laterality Date  . HERNIA REPAIR    . IR CV LINE INJECTION  01/14/2017  . IR FLUORO GUIDE PORT INSERTION RIGHT  12/25/2016  . IR GASTROSTOMY TUBE MOD SED  12/25/2016  . IR REMOVAL TUN ACCESS W/ PORT W/O FL MOD SED  03/06/2017  . IR US GUIDE VASC ACCESS RIGHT  12/25/2016  . polyp removal  2008   during colonoscopy  . TOTAL HIP ARTHROPLASTY Right 10/16/2015   Procedure: RIGHT TOTAL HIP ARTHROPLASTY ANTERIOR APPROACH;  Surgeon: Paralee Cancel, MD;  Location: [facility];  Service: Orthopedics;  Laterality: Right;  . vocal cord polyp      removed 2-3 years ago     There were no vitals filed for this visit.  Subjective Assessment - 04/01/17 1106    Subjective  Pt's wife set an alarm to do the massage every day because she forgot for 4 days.     Pertinent History  CT of neck on 11/17/16 showed 7 x 9 mm "cyst"  in the right lateral pharyngeal wall. Pt. with right tonsil squamous cell carcinoma, p16 positive.  Pt has completed chemotherapy and radiation, hx of right THA 2017, HTN    Patient Stated Goals  to get the swelling down    Currently in Pain?  No/denies    Pain Score  0-No pain                      [facility] Adult PT Treatment/Exercise - 04/01/17 0001      Manual Therapy   Manual Therapy  Manual Lymphatic Drainage (MLD);Edema management    Edema Management  showed pt sample of JoviPak head and neck garment and gave him info to look at other less expensive options- pt to look at all options and let us know next session what garment he would like to go with    Manual Lymphatic Drainage (MLD)  short neck, axillary nodes, shoulder collectors, posterior neck, lateral neck and anterior neck then retracing pathways finishing with short neck and axillary nodes noticable increase in wrinkles and decrease in edema                  PT Long  Term Goals - 03/18/17 1659      PT LONG TERM GOAL #1   Title  Pt will obtain appropriate compression garment for long term management of lymphedema    Time  4    Period  Weeks    Status  New    Target Date  04/15/17      PT LONG TERM GOAL #2   Title  Pt will report 75% improvement in anterior neck swelling to decrease risk of infection    Time  4    Period  Weeks    Status  New    Target Date  04/15/17      PT LONG TERM GOAL #3   Title  Pt will be independent in a home exercise program for neck stretching to help decrease neck pain with neck ROM    Time  4    Period  Weeks    Status  New    Target Date  04/15/17      PT LONG TERM GOAL #4   Title  Pt and/or wife will be independent in self MLD for anterior neck swelling    Time  4    Period  Weeks    Status  New    Target Date  04/15/17         Head and Neck Clinic Goals - 01/06/17 2024      Patient will be able to verbalize understanding of a home exercise program for  cervical range of motion, posture, and walking.    Status  Achieved      Patient will be able to verbalize understanding of proper sitting and standing posture.    Status  Achieved      Patient will be able to verbalize understanding of lymphedema risk and availability of treatment for this condition.    Status  Achieved        Plan - 04/01/17 1151    Clinical Impression Statement  Educated pt and wife about different head and neck garments that are available and gave them information to do some additional research and decide which garment they would like to purchase. Continued with MLD to pt's anterior neck and demonstrated correct pressure to use on pt's wife since she will be performing the massage at home.     Rehab Potential  Good    Clinical Impairments Affecting Rehab Potential  hx of radiation    PT Frequency  2x / week    PT Duration  4 weeks    PT Treatment/Interventions  ADLs/Self Care Home Management;Therapeutic activities;Therapeutic exercise;Manual techniques;Patient/family education;Manual lymph drainage;Compression bandaging;Passive range of motion;Vasopneumatic Device;Taping    PT Next Visit Plan  see which garment pt decided on and obtain rx if needed for jovipak, continue anterior neck MLD and educated in proper technique as needed, give neck ROM exercises    PT Home Exercise Plan  wear chip pack as much as possible, MLD 1-2x/day    Consulted and Agree with Plan of Care  Patient       Patient will benefit from skilled therapeutic intervention in order to improve the following deficits and impairments:  Decreased endurance, Increased edema, Postural dysfunction, Pain  Visit Diagnosis: Lymphedema, not elsewhere classified     Problem List Patient Active Problem List   Diagnosis Date Noted  . Counseling regarding advanced care planning and goals of care 12/22/2016  . Carcinoma of tonsillar fossa ([place]) 12/17/2016  . Status post total replacement  of right hip 10/16/2015  .  Osteoarthritis of right hip 04/05/2015  . GAD (generalized anxiety disorder) 04/05/2015  . Metabolic syndrome 09/81/1914  . Obese 04/05/2015  . GERD (gastroesophageal reflux disease) 07/06/2014  . Hypertension 03/14/2013  . Hyperlipidemia with target LDL less than 100 03/14/2013    Allyson Sabal [facility] 04/01/2017, 11:53 AM  [place], [place], [address] Phone: [phone]   Fax:  [phone]  Name: JAQUAVION MCCANNON MRN: [id number] Date of Birth: 11-19-1950  Manus Gunning, PT 04/01/17 11:53 AM

## 2017-04-03 DIAGNOSIS — C099 Malignant neoplasm of tonsil, unspecified: Secondary | ICD-10-CM | POA: Diagnosis not present

## 2017-04-03 DIAGNOSIS — R200 Anesthesia of skin: Secondary | ICD-10-CM | POA: Diagnosis not present

## 2017-04-06 ENCOUNTER — Encounter: Payer: Self-pay | Admitting: Physical Therapy

## 2017-04-08 ENCOUNTER — Ambulatory Visit: Payer: 59 | Admitting: Physical Therapy

## 2017-04-08 ENCOUNTER — Encounter: Payer: Self-pay | Admitting: Physical Therapy

## 2017-04-08 DIAGNOSIS — I890 Lymphedema, not elsewhere classified: Secondary | ICD-10-CM

## 2017-04-08 DIAGNOSIS — R1310 Dysphagia, unspecified: Secondary | ICD-10-CM | POA: Diagnosis not present

## 2017-04-08 DIAGNOSIS — R293 Abnormal posture: Secondary | ICD-10-CM | POA: Diagnosis not present

## 2017-04-08 NOTE — Therapy (Signed)
[place], [place], [address] Phone: [phone]   Fax:  [phone]  Physical Therapy Treatment  Patient Details  Name: Terry Pacheco MRN: [id number] Date of Birth: 1950-08-26 Referring Provider: Dr. Isidore Moos   Encounter Date: 04/08/2017  PT End of Session - 04/08/17 1157    Visit Number  6    Number of Visits  9    Date for PT Re-Evaluation  04/15/17    PT Start Time  1106    PT Stop Time  1145    PT Time Calculation (min)  39 min    Activity Tolerance  Patient tolerated treatment well    Behavior During Therapy  [facility] for tasks assessed/performed       Past Medical History:  Diagnosis Date  . Arthritis   . GERD (gastroesophageal reflux disease) 07/06/2014  . History of radiation therapy 12/30/2016- 02/18/2017   Right Tonsil and bilateral neck/ 70 Gy in 35 fractions to gross diseasae, 63 gy in 35 fractions to high risk nodal echelons, and 56 Gy in 35 fraction to intermediate risk nodal echelons.   . Hypertension   . Sleep apnea    does not use CPAP  . Wears glasses     Past Surgical History:  Procedure Laterality Date  . HERNIA REPAIR    . IR CV LINE INJECTION  01/14/2017  . IR FLUORO GUIDE PORT INSERTION RIGHT  12/25/2016  . IR GASTROSTOMY TUBE MOD SED  12/25/2016  . IR REMOVAL TUN ACCESS W/ PORT W/O FL MOD SED  03/06/2017  . IR US GUIDE VASC ACCESS RIGHT  12/25/2016  . polyp removal  2008   during colonoscopy  . TOTAL HIP ARTHROPLASTY Right 10/16/2015   Procedure: RIGHT TOTAL HIP ARTHROPLASTY ANTERIOR APPROACH;  Surgeon: Paralee Cancel, MD;  Location: [facility];  Service: Orthopedics;  Laterality: Right;  . vocal cord polyp      removed 2-3 years ago     There were no vitals filed for this visit.  Subjective Assessment - 04/08/17 1110    Subjective  My swelling is okay. My wife has been doing the massage.     Pertinent History  CT of neck on 11/17/16 showed 7 x 9 mm "cyst" in the right lateral  pharyngeal wall. Pt. with right tonsil squamous cell carcinoma, p16 positive.  Pt has completed chemotherapy and radiation, hx of right THA 2017, HTN    Patient Stated Goals  to get the swelling down    Currently in Pain?  No/denies    Pain Score  0-No pain                      [facility] Adult PT Treatment/Exercise - 04/08/17 0001      Manual Therapy   Manual Therapy  Manual Lymphatic Drainage (MLD);Soft tissue mobilization    Soft tissue mobilization  briefly to bilateral upper traps in area of tightness to help decrease neck discomfort    Manual Lymphatic Drainage (MLD)  short neck, axillary nodes, shoulder collectors, posterior neck, lateral neck and anterior neck then retracing pathways finishing with short neck and axillary nodes noticable increase in wrinkles and decrease in edema                  PT Long Term Goals - 04/08/17 1110      PT LONG TERM GOAL #1   Title  Pt will obtain appropriate compression garment for long term management of  lymphedema    Time  4    Period  Weeks    Status  On-going      PT LONG TERM GOAL #2   Title  Pt will report 75% improvement in anterior neck swelling to decrease risk of infection    Baseline  04/08/17- pt demonstrates 25% decrease in swelling    Time  4    Period  Weeks    Status  On-going      PT LONG TERM GOAL #3   Title  Pt will be independent in a home exercise program for neck stretching to help decrease neck pain with neck ROM    Time  4    Period  Weeks    Status  On-going      PT LONG TERM GOAL #4   Title  Pt and/or wife will be independent in self MLD for anterior neck swelling    Time  4    Period  Weeks    Status  On-going         Head and Neck Clinic Goals - 01/06/17 2024      Patient will be able to verbalize understanding of a home exercise program for cervical range of motion, posture, and walking.    Status  Achieved      Patient will be able to verbalize understanding of proper  sitting and standing posture.    Status  Achieved      Patient will be able to verbalize understanding of lymphedema risk and availability of treatment for this condition.    Status  Achieved        Plan - 04/08/17 1159    Clinical Impression Statement  Pt has decided to go with the [employer] head and neck garment with the added foam insert. Will send off an Rx to Dr. so pt can obtain this for long term management of head and neck swelling. Continued with MLD to anterior neck and also added soft tissue mobilization to upper traps to help decrease neck discomfort pt is having.     Rehab Potential  Good    Clinical Impairments Affecting Rehab Potential  hx of radiation    PT Frequency  2x / week    PT Duration  4 weeks    PT Treatment/Interventions  ADLs/Self Care Home Management;Therapeutic activities;Therapeutic exercise;Manual techniques;Patient/family education;Manual lymph drainage;Compression bandaging;Passive range of motion;Vasopneumatic Device;Taping    PT Next Visit Plan  continue anterior neck MLD and educated in proper technique as needed, give neck ROM exercises    PT Home Exercise Plan  wear chip pack as much as possible, MLD 1-2x/day       Patient will benefit from skilled therapeutic intervention in order to improve the following deficits and impairments:  Decreased endurance, Increased edema, Postural dysfunction, Pain  Visit Diagnosis: Lymphedema, not elsewhere classified     Problem List Patient Active Problem List   Diagnosis Date Noted  . Counseling regarding advanced care planning and goals of care 12/22/2016  . Carcinoma of tonsillar fossa ([place]) 12/17/2016  . 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 Osteoarthritis of right hip 04/05/2015  . GAD (generalized anxiety disorder) 04/05/2015  . Metabolic syndrome 93/81/0175  . Obese 04/05/2015  . GERD (gastroesophageal reflux disease) 07/06/2014  . Hypertension 03/14/2013  .  Hyperlipidemia with target LDL less than 100 03/14/2013    Allyson Sabal [facility] 04/08/2017, 12:01 PM  [place], [place],  [address] Phone: [phone]   Fax:  [phone]  Name: Terry Pacheco MRN: [id number] Date of Birth: September 03, 1950  Manus Gunning, PT 04/08/17 12:01 PM

## 2017-04-10 ENCOUNTER — Encounter: Payer: Self-pay | Admitting: Physical Therapy

## 2017-04-10 ENCOUNTER — Ambulatory Visit: Payer: 59 | Admitting: Physical Therapy

## 2017-04-10 DIAGNOSIS — I890 Lymphedema, not elsewhere classified: Secondary | ICD-10-CM

## 2017-04-10 DIAGNOSIS — R1310 Dysphagia, unspecified: Secondary | ICD-10-CM | POA: Diagnosis not present

## 2017-04-10 DIAGNOSIS — R293 Abnormal posture: Secondary | ICD-10-CM | POA: Diagnosis not present

## 2017-04-10 NOTE — Therapy (Signed)
[place], [place], [address] Phone: [phone]   Fax:  [phone]  Physical Therapy Treatment  Patient Details  Name: Terry Pacheco MRN: [id number] Date of Birth: 28-Jun-1950 Referring Provider: Dr. Isidore Moos   Encounter Date: 04/10/2017  PT End of Session - 04/10/17 1210    Visit Number  7    Number of Visits  9    Date for PT Re-Evaluation  04/15/17    PT Start Time  1021    PT Stop Time  1104    PT Time Calculation (min)  43 min    Activity Tolerance  Patient tolerated treatment well    Behavior During Therapy  [facility] for tasks assessed/performed       Past Medical History:  Diagnosis Date  . Arthritis   . GERD (gastroesophageal reflux disease) 07/06/2014  . History of radiation therapy 12/30/2016- 02/18/2017   Right Tonsil and bilateral neck/ 70 Gy in 35 fractions to gross diseasae, 63 gy in 35 fractions to high risk nodal echelons, and 56 Gy in 35 fraction to intermediate risk nodal echelons.   . Hypertension   . Sleep apnea    does not use CPAP  . Wears glasses     Past Surgical History:  Procedure Laterality Date  . HERNIA REPAIR    . IR CV LINE INJECTION  01/14/2017  . IR FLUORO GUIDE PORT INSERTION RIGHT  12/25/2016  . IR GASTROSTOMY TUBE MOD SED  12/25/2016  . IR REMOVAL TUN ACCESS W/ PORT W/O FL MOD SED  03/06/2017  . IR US GUIDE VASC ACCESS RIGHT  12/25/2016  . polyp removal  2008   during colonoscopy  . TOTAL HIP ARTHROPLASTY Right 10/16/2015   Procedure: RIGHT TOTAL HIP ARTHROPLASTY ANTERIOR APPROACH;  Surgeon: Paralee Cancel, MD;  Location: [facility];  Service: Orthopedics;  Laterality: Right;  . vocal cord polyp      removed 2-3 years ago     There were no vitals filed for this visit.  Subjective Assessment - 04/10/17 1024    Subjective  My swelling is doing okay.     Pertinent History  CT of neck on 11/17/16 showed 7 x 9 mm "cyst" in the right lateral pharyngeal wall. Pt. with right  tonsil squamous cell carcinoma, p16 positive.  Pt has completed chemotherapy and radiation, hx of right THA 2017, HTN    Patient Stated Goals  to get the swelling down    Currently in Pain?  No/denies    Pain Score  0-No pain                      [facility] Adult PT Treatment/Exercise - 04/10/17 0001      Manual Therapy   Manual Therapy  Manual Lymphatic Drainage (MLD)    Manual Lymphatic Drainage (MLD)  short neck, axillary nodes, shoulder collectors, posterior neck, lateral neck and anterior neck then retracing pathways finishing with short neck and axillary nodes noticable increase in wrinkles and decrease in edema                  PT Long Term Goals - 04/08/17 1110      PT LONG TERM GOAL #1   Title  Pt will obtain appropriate compression garment for long term management of lymphedema    Time  4    Period  Weeks    Status  On-going      PT LONG TERM GOAL #2  Title  Pt will report 75% improvement in anterior neck swelling to decrease risk of infection    Baseline  04/08/17- pt demonstrates 25% decrease in swelling    Time  4    Period  Weeks    Status  On-going      PT LONG TERM GOAL #3   Title  Pt will be independent in a home exercise program for neck stretching to help decrease neck pain with neck ROM    Time  4    Period  Weeks    Status  On-going      PT LONG TERM GOAL #4   Title  Pt and/or wife will be independent in self MLD for anterior neck swelling    Time  4    Period  Weeks    Status  On-going         Head and Neck Clinic Goals - 01/06/17 2024      Patient will be able to verbalize understanding of a home exercise program for cervical range of motion, posture, and walking.    Status  Achieved      Patient will be able to verbalize understanding of proper sitting and standing posture.    Status  Achieved      Patient will be able to verbalize understanding of lymphedema risk and availability of treatment for this  condition.    Status  Achieved        Plan - 04/10/17 1210    Clinical Impression Statement  Continued with head and neck MLD. Pt wore the foam chip pack at end of session to maintain gains obtained in therapy. Signed Rx came back after pt left session but will give to pt at next session.     Rehab Potential  Good    Clinical Impairments Affecting Rehab Potential  hx of radiation    PT Frequency  2x / week    PT Duration  4 weeks    PT Treatment/Interventions  ADLs/Self Care Home Management;Therapeutic activities;Therapeutic exercise;Manual techniques;Patient/family education;Manual lymph drainage;Compression bandaging;Passive range of motion;Vasopneumatic Device;Taping    PT Next Visit Plan  give signed Rx, continue anterior neck MLD and educated in proper technique as needed, give neck ROM exercises    PT Home Exercise Plan  wear chip pack as much as possible, MLD 1-2x/day    Consulted and Agree with Plan of Care  Patient       Patient will benefit from skilled therapeutic intervention in order to improve the following deficits and impairments:  Decreased endurance, Increased edema, Postural dysfunction, Pain  Visit Diagnosis: Lymphedema, not elsewhere classified     Problem List Patient Active Problem List   Diagnosis Date Noted  . Counseling regarding advanced care planning and goals of care 12/22/2016  . Carcinoma of tonsillar fossa ([place]) 12/17/2016  . 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 Osteoarthritis of right hip 04/05/2015  . GAD (generalized anxiety disorder) 04/05/2015  . Metabolic syndrome 16/11/9602  . Obese 04/05/2015  . GERD (gastroesophageal reflux disease) 07/06/2014  . Hypertension 03/14/2013  . Hyperlipidemia with target LDL less than 100 03/14/2013    Allyson Sabal [facility] 04/10/2017, 12:13 PM  [place], [place], [address] Phone: [phone]    Fax:  [phone]  Name: Terry Pacheco MRN: [id number] Date of Birth: 20-Apr-1950  Manus Gunning, PT 04/10/17 12:13 PM

## 2017-04-13 ENCOUNTER — Ambulatory Visit: Payer: 59

## 2017-04-13 ENCOUNTER — Ambulatory Visit: Payer: 59 | Admitting: Physical Therapy

## 2017-04-13 ENCOUNTER — Encounter: Payer: Self-pay | Admitting: Physical Therapy

## 2017-04-13 DIAGNOSIS — I890 Lymphedema, not elsewhere classified: Secondary | ICD-10-CM

## 2017-04-13 DIAGNOSIS — R1310 Dysphagia, unspecified: Secondary | ICD-10-CM

## 2017-04-13 DIAGNOSIS — R293 Abnormal posture: Secondary | ICD-10-CM | POA: Diagnosis not present

## 2017-04-13 NOTE — Therapy (Signed)
[facility] [address] [place], [place], [address] Phone: [phone]   Fax:  [phone]  Speech Language Pathology Treatment  Patient Details  Name: Terry Pacheco MRN: [id number] Date of Birth: Mar 29, 1950 Referring Provider: Eppie Gibson, MD   Encounter Date: 04/13/2017  End of Session - 04/13/17 0926    Visit Number  4    Number of Visits  5    Date for SLP Re-Evaluation  05/22/17    SLP Start Time  0850    SLP Stop Time   0922    SLP Time Calculation (min)  32 min    Activity Tolerance  Patient tolerated treatment well       Past Medical History:  Diagnosis Date  . Arthritis   . GERD (gastroesophageal reflux disease) 07/06/2014  . History of radiation therapy 12/30/2016- 02/18/2017   Right Tonsil and bilateral neck/ 70 Gy in 35 fractions to gross diseasae, 63 gy in 35 fractions to high risk nodal echelons, and 56 Gy in 35 fraction to intermediate risk nodal echelons.   . Hypertension   . Sleep apnea    does not use CPAP  . Wears glasses     Past Surgical History:  Procedure Laterality Date  . HERNIA REPAIR    . IR CV LINE INJECTION  01/14/2017  . IR FLUORO GUIDE PORT INSERTION RIGHT  12/25/2016  . IR GASTROSTOMY TUBE MOD SED  12/25/2016  . IR REMOVAL TUN ACCESS W/ PORT W/O FL MOD SED  03/06/2017  . IR US GUIDE VASC ACCESS RIGHT  12/25/2016  . polyp removal  2008   during colonoscopy  . TOTAL HIP ARTHROPLASTY Right 10/16/2015   Procedure: RIGHT TOTAL HIP ARTHROPLASTY ANTERIOR APPROACH;  Surgeon: Paralee Cancel, MD;  Location: [facility];  Service: Orthopedics;  Laterality: Right;  . vocal cord polyp      removed 2-3 years ago     There were no vitals filed for this visit.  Subjective Assessment - 04/13/17 0858    Subjective  "My taste still isn't there."    Patient is accompained by:  Family member wife    Currently in Pain?  No/denies            ADULT SLP TREATMENT - 04/13/17 0858      General  Information   Behavior/Cognition  Alert;Cooperative;Pleasant mood      Treatment Provided   Treatment provided  Dysphagia      Dysphagia Treatment   Temperature Spikes Noted  No    Respiratory Status  Room air    Treatment Methods  Skilled observation;Therapeutic exercise;Patient/caregiver education    Patient observed directly with PO's  Yes    Type of PO's observed  Dysphagia 3 (soft);Thin liquids    Oral Phase Signs & Symptoms  -- none noted    Pharyngeal Phase Signs & Symptoms  -- none noted    Other treatment/comments  Pt still on tube feeds and/or liquids (Ensure Plus) per day, mostly due to pt dysgeusia/ageusia. Pt denied desire to complete Finding Your New Normal class. Pt wife and pt both deny swallowing difficulties with POs. Pt req'd rare min A with HEP (hold breath on vocal adduction, hold 5 seconds on chin-fist). Pt reported that despite these small corrections he is confident he can complete the HEP at home correctly. "I need to start doing it twice a day all the time" pt stated.       Assessment / Recommendations / Plan  Plan  -- see pt two months (April)      Progression Toward Goals   Progression toward goals  Progressing toward goals       SLP Education - 04/13/17 0925    Education provided  Yes    Education Details  late effects head/neck radiation on swallowing fucntion, procedure for HEP, Finding your New Normal class (H/N version)    Person(s) Educated  Patient;Spouse    Methods  Explanation;Demonstration;Verbal cues    Comprehension  Verbalized understanding;Returned demonstration;Verbal cues required       SLP Short Term Goals - 03/16/17 1311      SLP SHORT TERM GOAL #1   Title  pt will complete HEP with rare min A     Status  Achieved      SLP SHORT TERM GOAL #2   Title  pt will tell SLP why he is completing HEP     Status  Achieved      SLP SHORT TERM GOAL #3   Title  pt will tell SLP 3 overt s/s aspiration PNA with modified independence     Status  Achieved       SLP Long Term Goals - 04/13/17 0934      SLP LONG TERM GOAL #1   Title  pt will tell SLP why food journal can be helpful in returning to least restrictive food consistencies    Status  Achieved      SLP LONG TERM GOAL #2   Title  pt will complete HEP with modified independence over three sessions    Time  1    Period  -- visits    Status  On-going      SLP LONG TERM GOAL #3   Title  pt will tell SLP when HEP frequency can be reduced to x2-3/week    Time  1    Period  -- vists    Status  On-going       Plan - 04/13/17 0927    Clinical Impression Statement  Pt with oropharyngeal swallowing WNL for dys III/thin, however the probability of swallowing difficulty remains and heightens after chemo and radiation therapy. Pt reports he is comfortable to continue in two months and will increase his frequency of HEP. Pt will need to cont to be followed by SLP for regular assessment of accurate HEP completion as well as for safety with POs both during and following treatment/s, likely 1 more sessions.    Speech Therapy Frequency  -- approx once every other month    Duration  -- 5 total visits    Treatment/Interventions  Aspiration precaution training;Pharyngeal strengthening exercises;Diet toleration management by SLP;Environmental controls;Compensatory techniques;Cueing hierarchy;SLP instruction and feedback;Multimodal communcation approach;Internal/external aids;Trials of upgraded texture/liquids;Patient/family education any or all will be used    Potential to Achieve Goals  Good    SLP Home Exercise Plan  provided today    Consulted and Agree with Plan of Care  Patient       Patient will benefit from skilled therapeutic intervention in order to improve the following deficits and impairments:   Dysphagia, unspecified type    Problem List Patient Active Problem List   Diagnosis Date Noted  . Counseling regarding advanced care planning and goals of care  12/22/2016  . Carcinoma of tonsillar fossa ([place]) 12/17/2016  . 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 Osteoarthritis of right hip 04/05/2015  . GAD (generalized anxiety disorder) 04/05/2015  . Metabolic syndrome 22/97/9892  .  Obese 04/05/2015  . GERD (gastroesophageal reflux disease) 07/06/2014  . Hypertension 03/14/2013  . Hyperlipidemia with target LDL less than 100 03/14/2013    Terry Pacheco ,MS, CCC-SLP  04/13/2017, 9:35 AM  [facility] [address] [place], [place], [address] Phone: [phone]   Fax:  [phone]   Name: Terry Pacheco MRN: [id number] Date of Birth: February 18, 1951

## 2017-04-13 NOTE — Therapy (Signed)
[place], [place], [address] Phone: [phone]   Fax:  [phone]  Physical Therapy Treatment  Patient Details  Name: Terry Pacheco MRN: [id number] Date of Birth: [birth year] Referring Provider: Dr. Isidore Moos   Encounter Date: 04/13/2017  PT End of Session - 04/13/17 1207    Visit Number  8    Number of Visits  9    Date for PT Re-Evaluation  04/15/17    PT Start Time  1022    PT Stop Time  1103    PT Time Calculation (min)  41 min    Activity Tolerance  Patient tolerated treatment well    Behavior During Therapy  [facility] for tasks assessed/performed       Past Medical History:  Diagnosis Date  . Arthritis   . GERD (gastroesophageal reflux disease) 07/06/2014  . History of radiation therapy 12/30/2016- 02/18/2017   Right Tonsil and bilateral neck/ 70 Gy in 35 fractions to gross diseasae, 63 gy in 35 fractions to high risk nodal echelons, and 56 Gy in 35 fraction to intermediate risk nodal echelons.   . Hypertension   . Sleep apnea    does not use CPAP  . Wears glasses     Past Surgical History:  Procedure Laterality Date  . HERNIA REPAIR    . IR CV LINE INJECTION  01/14/2017  . IR FLUORO GUIDE PORT INSERTION RIGHT  12/25/2016  . IR GASTROSTOMY TUBE MOD SED  12/25/2016  . IR REMOVAL TUN ACCESS W/ PORT W/O FL MOD SED  03/06/2017  . IR US GUIDE VASC ACCESS RIGHT  12/25/2016  . polyp removal  2008   during colonoscopy  . TOTAL HIP ARTHROPLASTY Right 10/16/2015   Procedure: RIGHT TOTAL HIP ARTHROPLASTY ANTERIOR APPROACH;  Surgeon: Paralee Cancel, MD;  Location: [facility];  Service: Orthopedics;  Laterality: Right;  . vocal cord polyp      removed 2-3 years ago     There were no vitals filed for this visit.  Subjective Assessment - 04/13/17 1024    Subjective  Pt reports he wore the chip pack for about 3 hours yesterday.     Patient is accompained by:  Family member    Pertinent History  CT of neck on  11/17/16 showed 7 x 9 mm "cyst" in the right lateral pharyngeal wall. Pt. with right tonsil squamous cell carcinoma, p16 positive.  Pt has completed chemotherapy and radiation, hx of right THA 2017, HTN    Patient Stated Goals  to get the swelling down    Currently in Pain?  No/denies    Pain Score  0-No pain                      [facility] Adult PT Treatment/Exercise - 04/13/17 0001      Manual Therapy   Manual Therapy  Manual Lymphatic Drainage (MLD)  (Pended)     Manual Lymphatic Drainage (MLD)  short neck, axillary nodes, shoulder collectors, posterior neck, lateral neck and anterior neck then retracing pathways finishing with short neck and axillary nodes  (Pended)  noticable increase in wrinkles and decrease in edema                  PT Long Term Goals - 04/08/17 1110      PT LONG TERM GOAL #1   Title  Pt will obtain appropriate compression garment for long term management of lymphedema    Time  4    Period  Weeks    Status  On-going      PT LONG TERM GOAL #2   Title  Pt will report 75% improvement in anterior neck swelling to decrease risk of infection    Baseline  04/08/17- pt demonstrates 25% decrease in swelling    Time  4    Period  Weeks    Status  On-going      PT LONG TERM GOAL #3   Title  Pt will be independent in a home exercise program for neck stretching to help decrease neck pain with neck ROM    Time  4    Period  Weeks    Status  On-going      PT LONG TERM GOAL #4   Title  Pt and/or wife will be independent in self MLD for anterior neck swelling    Time  4    Period  Weeks    Status  On-going         Head and Neck Clinic Goals - 01/06/17 2024      Patient will be able to verbalize understanding of a home exercise program for cervical range of motion, posture, and walking.    Status  Achieved      Patient will be able to verbalize understanding of proper sitting and standing posture.    Status  Achieved       Patient will be able to verbalize understanding of lymphedema risk and availability of treatment for this condition.    Status  Achieved        Plan - 04/13/17 1208    Clinical Impression Statement  Gave pt his signed Rx to obtain his head and neck garment. They were going to go by and order it after this session. Continued with MLD to anterior neck to help decrease swelling. Pt will be ready for discharge once he obtains his head and neck garment for long term management of edema.     Rehab Potential  Good    Clinical Impairments Affecting Rehab Potential  hx of radiation    PT Frequency  2x / week    PT Duration  4 weeks    PT Treatment/Interventions  ADLs/Self Care Home Management;Therapeutic activities;Therapeutic exercise;Manual techniques;Patient/family education;Manual lymph drainage;Compression bandaging;Passive range of motion;Vasopneumatic Device;Taping    PT Next Visit Plan  see if pt obtained head and neck garment,  continue anterior neck MLD and educated in proper technique as needed, give neck ROM exercises    PT Home Exercise Plan  wear chip pack as much as possible, MLD 1-2x/day    Consulted and Agree with Plan of Care  Patient;Family member/caregiver    Family Member Consulted  wife       Patient will benefit from skilled therapeutic intervention in order to improve the following deficits and impairments:  Decreased endurance, Increased edema, Postural dysfunction, Pain  Visit Diagnosis: Lymphedema, not elsewhere classified     Problem List Patient Active Problem List   Diagnosis Date Noted  . Counseling regarding advanced care planning and goals of care 12/22/2016  . Carcinoma of tonsillar fossa ([place]) 12/17/2016  . 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 Osteoarthritis of right hip 04/05/2015  . GAD (generalized anxiety disorder) 04/05/2015  . Metabolic syndrome 14/43/1540  . Obese 04/05/2015  . GERD (gastroesophageal reflux disease)  07/06/2014  . Hypertension 03/14/2013  . Hyperlipidemia with target LDL less than 100 03/14/2013    Allyson Sabal  [facility] 04/13/2017, 12:09 PM  [place], [place], [address] Phone: [phone]   Fax:  [phone]  Name: ARATH KAIGLER MRN: [id number] Date of Birth: 1950-04-08  Manus Gunning, PT 04/13/17 12:10 PM

## 2017-04-14 ENCOUNTER — Encounter: Payer: Self-pay | Admitting: Nutrition

## 2017-04-14 ENCOUNTER — Inpatient Hospital Stay: Payer: 59 | Attending: Hematology

## 2017-04-14 NOTE — Progress Notes (Signed)
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  Patient has completed radiation and chemotherapy on 02/18/17.    Met with patient and wife today.  Patient reports that he has been either drinking ensure plus/boost plus or taking osmolite 1.5 through the tube 4 cartons per day.  Patient reports that he has also been eating a little bit more orally.  Has been seen by SLP and noted safe for thin liquids.  Patient reports has never been a big breakfast person.  Has been eating soups, 1/3rd of sandwich, drinking milk.  Reports that he tried a hot dog with mustard, chili and slaw but mustard burned.  Reports taste is still off.  Reports had GI bug last week that effected oral intake, reports did mostly tube feeding for few days.    Continues to drink water and other fluids, about 40 oz per day.  Reports that when he uses tube will flush with 70m of water before and after feeding.  Patient reports still feels weak and thinks if he eats more he will fill better.   Medications: reviewed  Labs: reviewed  Anthropometrics:   Weight checked today in RD office and 181 lb decreased from 185 lb 4 oz on 1/15 (measured in RD office)   Estimated Energy Needs  Kcals: 2200-2400 calories/d Protein: 115-125 g/d Fluid: 2.4 L/d  NUTRITION DIAGNOSIS: Inadequate oral intake continues   INTERVENTION:   Encouraged patient to begin eating at breakfast, lunch and dinner time. Patient agreed that he could try 1 pancake and milk at breakfast, sandwich with meat, cheese or chicken/tuna salad or peanut butter and jelly would be options at lunch.  We discussed dinner options as well (wants to try spaghetti, cooked shrimp) Offered sample meal plan with correct calorie amount for patient to review and declined at this time.      MONITORING, EVALUATION, GOAL: Patient will work to increase oral intake to minimize further weight loss and promote healing.     NEXT VISIT: March 12 prior to MD  appt  October Peery B. AZenia Resides [place] LLos GatosRegistered Dietitian [phone](pager)

## 2017-04-15 ENCOUNTER — Ambulatory Visit: Payer: 59 | Admitting: Physical Therapy

## 2017-04-15 ENCOUNTER — Encounter: Payer: Self-pay | Admitting: Physical Therapy

## 2017-04-15 DIAGNOSIS — I890 Lymphedema, not elsewhere classified: Secondary | ICD-10-CM | POA: Diagnosis not present

## 2017-04-15 DIAGNOSIS — R1310 Dysphagia, unspecified: Secondary | ICD-10-CM | POA: Diagnosis not present

## 2017-04-15 DIAGNOSIS — R293 Abnormal posture: Secondary | ICD-10-CM | POA: Diagnosis not present

## 2017-04-15 NOTE — Therapy (Signed)
[place], [place], [address] Phone: [phone]   Fax:  [phone]  Physical Therapy Treatment  Patient Details  Name: Terry Pacheco MRN: [id number] Date of Birth: 1950/11/13 Referring Provider: Dr. Isidore Moos   Encounter Date: 04/15/2017  PT End of Session - 04/15/17 1148    Visit Number  9    Number of Visits  9    Date for PT Re-Evaluation  04/15/17    PT Start Time  1103    PT Stop Time  1145    PT Time Calculation (min)  42 min    Activity Tolerance  Patient tolerated treatment well    Behavior During Therapy  [facility] for tasks assessed/performed       Past Medical History:  Diagnosis Date  . Arthritis   . GERD (gastroesophageal reflux disease) 07/06/2014  . History of radiation therapy 12/30/2016- 02/18/2017   Right Tonsil and bilateral neck/ 70 Gy in 35 fractions to gross diseasae, 63 gy in 35 fractions to high risk nodal echelons, and 56 Gy in 35 fraction to intermediate risk nodal echelons.   . Hypertension   . Sleep apnea    does not use CPAP  . Wears glasses     Past Surgical History:  Procedure Laterality Date  . HERNIA REPAIR    . IR CV LINE INJECTION  01/14/2017  . IR FLUORO GUIDE PORT INSERTION RIGHT  12/25/2016  . IR GASTROSTOMY TUBE MOD SED  12/25/2016  . IR REMOVAL TUN ACCESS W/ PORT W/O FL MOD SED  03/06/2017  . IR US GUIDE VASC ACCESS RIGHT  12/25/2016  . polyp removal  2008   during colonoscopy  . TOTAL HIP ARTHROPLASTY Right 10/16/2015   Procedure: RIGHT TOTAL HIP ARTHROPLASTY ANTERIOR APPROACH;  Surgeon: Paralee Cancel, MD;  Location: [facility];  Service: Orthopedics;  Laterality: Right;  . vocal cord polyp      removed 2-3 years ago     There were no vitals filed for this visit.  Subjective Assessment - 04/15/17 1103    Subjective  I went to order the garment but they were closed on Monday. Going to try later this week.     Pertinent History  CT of neck on 11/17/16 showed 7 x 9  mm "cyst" in the right lateral pharyngeal wall. Pt. with right tonsil squamous cell carcinoma, p16 positive.  Pt has completed chemotherapy and radiation, hx of right THA 2017, HTN    Patient Stated Goals  to get the swelling down    Currently in Pain?  No/denies    Pain Score  0-No pain                      [facility] Adult PT Treatment/Exercise - 04/15/17 0001      Manual Therapy   Manual Therapy  Manual Lymphatic Drainage (MLD)    Manual Lymphatic Drainage (MLD)  short neck, axillary nodes, shoulder collectors, posterior neck, lateral neck and anterior neck then retracing pathways finishing with short neck and axillary nodes noticable increase in wrinkles and decrease in edema                  PT Long Term Goals - 04/08/17 1110      PT LONG TERM GOAL #1   Title  Pt will obtain appropriate compression garment for long term management of lymphedema    Time  4    Period  Weeks  Status  On-going      PT LONG TERM GOAL #2   Title  Pt will report 75% improvement in anterior neck swelling to decrease risk of infection    Baseline  04/08/17- pt demonstrates 25% decrease in swelling    Time  4    Period  Weeks    Status  On-going      PT LONG TERM GOAL #3   Title  Pt will be independent in a home exercise program for neck stretching to help decrease neck pain with neck ROM    Time  4    Period  Weeks    Status  On-going      PT LONG TERM GOAL #4   Title  Pt and/or wife will be independent in self MLD for anterior neck swelling    Time  4    Period  Weeks    Status  On-going         Head and Neck Clinic Goals - 01/06/17 2024      Patient will be able to verbalize understanding of a home exercise program for cervical range of motion, posture, and walking.    Status  Achieved      Patient will be able to verbalize understanding of proper sitting and standing posture.    Status  Achieved      Patient will be able to verbalize understanding  of lymphedema risk and availability of treatment for this condition.    Status  Achieved        Plan - 04/15/17 1148    Clinical Impression Statement  Continued with MLD today. Pt attempted to obtain his compression garment from DME supplier but they were closed on Monday. He is going to try again later this week. Once pt obtains his garment he will be ready for discharge.     Rehab Potential  Good    Clinical Impairments Affecting Rehab Potential  hx of radiation    PT Frequency  2x / week    PT Duration  4 weeks    PT Treatment/Interventions  ADLs/Self Care Home Management;Therapeutic activities;Therapeutic exercise;Manual techniques;Patient/family education;Manual lymph drainage;Compression bandaging;Passive range of motion;Vasopneumatic Device;Taping    PT Next Visit Plan  see if pt obtained head and neck garment,  continue anterior neck MLD and educated in proper technique as needed, give neck ROM exercises    PT Home Exercise Plan  wear chip pack as much as possible, MLD 1-2x/day    Consulted and Agree with Plan of Care  Patient       Patient will benefit from skilled therapeutic intervention in order to improve the following deficits and impairments:  Decreased endurance, Increased edema, Postural dysfunction, Pain  Visit Diagnosis: Lymphedema, not elsewhere classified     Problem List Patient Active Problem List   Diagnosis Date Noted  . Counseling regarding advanced care planning and goals of care 12/22/2016  . Carcinoma of tonsillar fossa ([place]) 12/17/2016  . 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 Osteoarthritis of right hip 04/05/2015  . GAD (generalized anxiety disorder) 04/05/2015  . Metabolic syndrome 66/07/3014  . Obese 04/05/2015  . GERD (gastroesophageal reflux disease) 07/06/2014  . Hypertension 03/14/2013  . Hyperlipidemia with target LDL less than 100 03/14/2013    Allyson Sabal [facility] 04/15/2017, 11:56 AM  [place], [place], [address] Phone: [phone]   Fax:  [phone]  Name: Terry Pacheco MRN: [id number] Date  of Birth: 10/23/1950  Manus Gunning, PT 04/15/17 11:56 AM

## 2017-04-20 ENCOUNTER — Encounter: Payer: Self-pay | Admitting: Physical Therapy

## 2017-04-20 ENCOUNTER — Ambulatory Visit: Payer: 59 | Admitting: Physical Therapy

## 2017-04-20 ENCOUNTER — Other Ambulatory Visit: Payer: Self-pay | Admitting: Hematology

## 2017-04-20 DIAGNOSIS — R1310 Dysphagia, unspecified: Secondary | ICD-10-CM | POA: Diagnosis not present

## 2017-04-20 DIAGNOSIS — R293 Abnormal posture: Secondary | ICD-10-CM

## 2017-04-20 DIAGNOSIS — I890 Lymphedema, not elsewhere classified: Secondary | ICD-10-CM | POA: Diagnosis not present

## 2017-04-20 NOTE — Therapy (Signed)
[place], [place], [address] Phone: [phone]   Fax:  [phone]  Physical Therapy Treatment  Patient Details  Name: Terry Pacheco MRN: [id number] Date of Birth: February 26, 1950 Referring Provider: Dr. Isidore Moos   Encounter Date: 04/20/2017  PT End of Session - 04/20/17 1147    Visit Number  10    Number of Visits  15    Date for PT Re-Evaluation  05/18/17    PT Start Time  1057    PT Stop Time  1145    PT Time Calculation (min)  48 min    Activity Tolerance  Patient tolerated treatment well    Behavior During Therapy  [facility] for tasks assessed/performed       Past Medical History:  Diagnosis Date  . Arthritis   . GERD (gastroesophageal reflux disease) 07/06/2014  . History of radiation therapy 12/30/2016- 02/18/2017   Right Tonsil and bilateral neck/ 70 Gy in 35 fractions to gross diseasae, 63 gy in 35 fractions to high risk nodal echelons, and 56 Gy in 35 fraction to intermediate risk nodal echelons.   . Hypertension   . Sleep apnea    does not use CPAP  . Wears glasses     Past Surgical History:  Procedure Laterality Date  . HERNIA REPAIR    . IR CV LINE INJECTION  01/14/2017  . IR FLUORO GUIDE PORT INSERTION RIGHT  12/25/2016  . IR GASTROSTOMY TUBE MOD SED  12/25/2016  . IR REMOVAL TUN ACCESS W/ PORT W/O FL MOD SED  03/06/2017  . IR US GUIDE VASC ACCESS RIGHT  12/25/2016  . polyp removal  2008   during colonoscopy  . TOTAL HIP ARTHROPLASTY Right 10/16/2015   Procedure: RIGHT TOTAL HIP ARTHROPLASTY ANTERIOR APPROACH;  Surgeon: Paralee Cancel, MD;  Location: [facility];  Service: Orthopedics;  Laterality: Right;  . vocal cord polyp      removed 2-3 years ago     There were no vitals filed for this visit.  Subjective Assessment - 04/20/17 1059    Subjective  I am going to get my wife to come with me Wednesday so we can go buy and get the compression garment.     Pertinent History  CT of neck on 11/17/16  showed 7 x 9 mm "cyst" in the right lateral pharyngeal wall. Pt. with right tonsil squamous cell carcinoma, p16 positive.  Pt has completed chemotherapy and radiation, hx of right THA 2017, HTN    Patient Stated Goals  to get the swelling down    Currently in Pain?  No/denies    Pain Score  0-No pain            LYMPHEDEMA/ONCOLOGY QUESTIONNAIRE - 04/20/17 1101      Head and Neck   4 cm superior to sternal notch around neck  43 cm    6 cm superior to sternal notch around neck  43.5 cm    8 cm superior to sternal notch around neck  43.5 cm    Other  45 cm at 10 cm superior to sternal notch around neck               [facility] Adult PT Treatment/Exercise - 04/20/17 0001      Manual Therapy   Manual Therapy  Manual Lymphatic Drainage (MLD)    Manual Lymphatic Drainage (MLD)  short neck, axillary nodes, shoulder collectors, posterior neck, lateral neck and anterior neck then retracing pathways finishing with  short neck and axillary nodes noticable increase in wrinkles and decrease in edema                  PT Long Term Goals - 04/20/17 1100      PT LONG TERM GOAL #1   Title  Pt will obtain appropriate compression garment for long term management of lymphedema    Baseline  04/20/17- pt going on Wednesday to pick it up    Time  4    Period  Weeks    Status  On-going      PT LONG TERM GOAL #2   Title  Pt will report 75% improvement in anterior neck swelling to decrease risk of infection    Baseline  04/08/17- pt demonstrates 25% decrease in swelling, 04/20/17- 33%    Time  4    Period  Weeks    Status  On-going      PT LONG TERM GOAL #3   Title  Pt will be independent in a home exercise program for neck stretching to help decrease neck pain with neck ROM    Baseline  04/20/17- issued handout today and pt states he has been doing all exercises on the handout already at home    Time  4    Period  Weeks    Status  Achieved      PT LONG TERM GOAL #4   Title  Pt  and/or wife will be independent in self MLD for anterior neck swelling    Baseline  04/20/17- pt's wife is independent with this    Time  4    Period  Weeks    Status  Achieved         Head and Neck Clinic Goals - 01/06/17 2024      Patient will be able to verbalize understanding of a home exercise program for cervical range of motion, posture, and walking.    Status  Achieved      Patient will be able to verbalize understanding of proper sitting and standing posture.    Status  Achieved      Patient will be able to verbalize understanding of lymphedema risk and availability of treatment for this condition.    Status  Achieved        Plan - 04/20/17 1147    Clinical Impression Statement  Assessed pt's progress towards goals in therapy. He is progressing towards his goals and reports further improvement in his swelling. He is planning on obtaining compression garments on Wednesday since they are closed today. Pt would benefit from 4-5  more sessions to allow him to obtain his compression garment so therapist can assess garment for fit and continue to help him manage his head and neck lymphedema.     Rehab Potential  Good    Clinical Impairments Affecting Rehab Potential  hx of radiation    PT Frequency  1x / week 2x/wk for this week then decreasing to 1x/wk for 4 wks    PT Duration  4 weeks    PT Treatment/Interventions  ADLs/Self Care Home Management;Therapeutic activities;Therapeutic exercise;Manual techniques;Patient/family education;Manual lymph drainage;Compression bandaging;Passive range of motion;Vasopneumatic Device;Taping    PT Next Visit Plan  see if pt obtained head and neck garment,  continue anterior neck MLD and educated in proper technique as needed, give neck ROM exercises    PT Home Exercise Plan  wear chip pack as much as possible, MLD 1-2x/day    Consulted and Agree with Plan  of Care  Patient       Patient will benefit from skilled therapeutic  intervention in order to improve the following deficits and impairments:  Decreased endurance, Increased edema, Postural dysfunction, Pain  Visit Diagnosis: Lymphedema, not elsewhere classified  Abnormal posture     Problem List Patient Active Problem List   Diagnosis Date Noted  . Counseling regarding advanced care planning and goals of care 12/22/2016  . Carcinoma of tonsillar fossa ([place]) 12/17/2016  . 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 Osteoarthritis of right hip 04/05/2015  . GAD (generalized anxiety disorder) 04/05/2015  . Metabolic syndrome 74/73/4037  . Obese 04/05/2015  . GERD (gastroesophageal reflux disease) 07/06/2014  . Hypertension 03/14/2013  . Hyperlipidemia with target LDL less than 100 03/14/2013    Allyson Sabal [facility] 04/20/2017, 11:50 AM  [place], [place], [address] Phone: [phone]   Fax:  [phone]  Name: Terry Pacheco MRN: [id number] Date of Birth: [birth year]  Manus Gunning, PT 04/20/17 11:52 AM

## 2017-04-22 ENCOUNTER — Encounter: Payer: Self-pay | Admitting: Physical Therapy

## 2017-04-22 ENCOUNTER — Ambulatory Visit: Payer: 59 | Admitting: Physical Therapy

## 2017-04-22 DIAGNOSIS — I890 Lymphedema, not elsewhere classified: Secondary | ICD-10-CM | POA: Diagnosis not present

## 2017-04-22 DIAGNOSIS — R293 Abnormal posture: Secondary | ICD-10-CM | POA: Diagnosis not present

## 2017-04-22 DIAGNOSIS — R1310 Dysphagia, unspecified: Secondary | ICD-10-CM | POA: Diagnosis not present

## 2017-04-22 NOTE — Therapy (Signed)
[place], [place], [address] Phone: [phone]   Fax:  [phone]  Physical Therapy Treatment  Patient Details  Name: Terry Pacheco MRN: [id number] Date of Birth: 1950-12-07 Referring Provider: Dr. Isidore Moos   Encounter Date: 04/22/2017  PT End of Session - 04/22/17 1139    Visit Number  11    Number of Visits  15    Date for PT Re-Evaluation  05/18/17    PT Start Time  6789    PT Stop Time  1137    PT Time Calculation (min)  46 min    Activity Tolerance  Patient tolerated treatment well    Behavior During Therapy  [facility] for tasks assessed/performed       Past Medical History:  Diagnosis Date  . Arthritis   . GERD (gastroesophageal reflux disease) 07/06/2014  . History of radiation therapy 12/30/2016- 02/18/2017   Right Tonsil and bilateral neck/ 70 Gy in 35 fractions to gross diseasae, 63 gy in 35 fractions to high risk nodal echelons, and 56 Gy in 35 fraction to intermediate risk nodal echelons.   . Hypertension   . Sleep apnea    does not use CPAP  . Wears glasses     Past Surgical History:  Procedure Laterality Date  . HERNIA REPAIR    . IR CV LINE INJECTION  01/14/2017  . IR FLUORO GUIDE PORT INSERTION RIGHT  12/25/2016  . IR GASTROSTOMY TUBE MOD SED  12/25/2016  . IR REMOVAL TUN ACCESS W/ PORT W/O FL MOD SED  03/06/2017  . IR US GUIDE VASC ACCESS RIGHT  12/25/2016  . polyp removal  2008   during colonoscopy  . TOTAL HIP ARTHROPLASTY Right 10/16/2015   Procedure: RIGHT TOTAL HIP ARTHROPLASTY ANTERIOR APPROACH;  Surgeon: Paralee Cancel, MD;  Location: [facility];  Service: Orthopedics;  Laterality: Right;  . vocal cord polyp      removed 2-3 years ago     There were no vitals filed for this visit.  Subjective Assessment - 04/22/17 1054    Subjective  I ordered my compression garments yesterday and I should be able to pick it up next week.     Pertinent History  CT of neck on 11/17/16 showed 7 x  9 mm "cyst" in the right lateral pharyngeal wall. Pt. with right tonsil squamous cell carcinoma, p16 positive.  Pt has completed chemotherapy and radiation, hx of right THA 2017, HTN    Patient Stated Goals  to get the swelling down    Currently in Pain?  No/denies    Pain Score  0-No pain                      [facility] Adult PT Treatment/Exercise - 04/22/17 0001      Manual Therapy   Manual Therapy  Manual Lymphatic Drainage (MLD)    Manual Lymphatic Drainage (MLD)  short neck, axillary nodes, shoulder collectors, posterior neck, lateral neck and anterior neck then retracing pathways finishing with short neck and axillary nodes                  PT Long Term Goals - 04/20/17 1100      PT LONG TERM GOAL #1   Title  Pt will obtain appropriate compression garment for long term management of lymphedema    Baseline  04/20/17- pt going on Wednesday to pick it up    Time  4    Period  Weeks    Status  On-going      PT LONG TERM GOAL #2   Title  Pt will report 75% improvement in anterior neck swelling to decrease risk of infection    Baseline  04/08/17- pt demonstrates 25% decrease in swelling, 04/20/17- 33%    Time  4    Period  Weeks    Status  On-going      PT LONG TERM GOAL #3   Title  Pt will be independent in a home exercise program for neck stretching to help decrease neck pain with neck ROM    Baseline  04/20/17- issued handout today and pt states he has been doing all exercises on the handout already at home    Time  4    Period  Weeks    Status  Achieved      PT LONG TERM GOAL #4   Title  Pt and/or wife will be independent in self MLD for anterior neck swelling    Baseline  04/20/17- pt's wife is independent with this    Time  4    Period  Weeks    Status  Achieved         Head and Neck Clinic Goals - 01/06/17 2024      Patient will be able to verbalize understanding of a home exercise program for cervical range of motion, posture, and walking.     Status  Achieved      Patient will be able to verbalize understanding of proper sitting and standing posture.    Status  Achieved      Patient will be able to verbalize understanding of lymphedema risk and availability of treatment for this condition.    Status  Achieved        Plan - 04/22/17 1140    Clinical Impression Statement  Continued with MLD to pt's anterior neck today to help decrease swelling. He was measured for his head and neck compression garment and it should arrive next week. Once his compression garment arrives and therapist is able to assess fit pt will be approaching discharge from therapy.     Rehab Potential  Good    Clinical Impairments Affecting Rehab Potential  hx of radiation    PT Frequency  1x / week    PT Duration  4 weeks    PT Treatment/Interventions  ADLs/Self Care Home Management;Therapeutic activities;Therapeutic exercise;Manual techniques;Patient/family education;Manual lymph drainage;Compression bandaging;Passive range of motion;Vasopneumatic Device;Taping    PT Next Visit Plan  see if pt obtained head and neck garment,  continue anterior neck MLD and educated in proper technique as needed, give neck ROM exercises    PT Home Exercise Plan  wear chip pack as much as possible, MLD 1-2x/day    Consulted and Agree with Plan of Care  Patient       Patient will benefit from skilled therapeutic intervention in order to improve the following deficits and impairments:  Decreased endurance, Increased edema, Postural dysfunction, Pain  Visit Diagnosis: Lymphedema, not elsewhere classified     Problem List Patient Active Problem List   Diagnosis Date Noted  . Counseling regarding advanced care planning and goals of care 12/22/2016  . Carcinoma of tonsillar fossa ([place]) 12/17/2016  . 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 Osteoarthritis of right hip 04/05/2015  . GAD (generalized anxiety disorder) 04/05/2015  .  Metabolic syndrome 71/69/6789  . Obese 04/05/2015  . GERD (gastroesophageal reflux disease) 07/06/2014  . Hypertension  03/14/2013  . Hyperlipidemia with target LDL less than 100 03/14/2013    Allyson Sabal [facility] 04/22/2017, 11:41 AM  [place], [place], [address] Phone: [phone]   Fax:  [phone]  Name: LEO FRAY MRN: [id number] Date of Birth: 1950/11/07  Manus Gunning, PT 04/22/17 11:41 AM

## 2017-04-25 ENCOUNTER — Other Ambulatory Visit: Payer: Self-pay | Admitting: Family Medicine

## 2017-04-27 NOTE — Telephone Encounter (Signed)
Authorize 30 days only. Then contact the patient letting them know that they will need an appointment before any further prescriptions can be sent in. 

## 2017-04-29 ENCOUNTER — Encounter: Payer: Self-pay | Admitting: Physical Therapy

## 2017-04-29 ENCOUNTER — Ambulatory Visit: Payer: 59 | Attending: Radiation Oncology | Admitting: Physical Therapy

## 2017-04-29 ENCOUNTER — Ambulatory Visit (HOSPITAL_COMMUNITY)
Admission: RE | Admit: 2017-04-29 | Discharge: 2017-04-29 | Disposition: A | Discharge status: Home / Self Care | Payer: 59 | Source: Ambulatory Visit | Attending: Hematology | Admitting: Hematology

## 2017-04-29 DIAGNOSIS — C090 Malignant neoplasm of tonsillar fossa: Secondary | ICD-10-CM | POA: Diagnosis not present

## 2017-04-29 DIAGNOSIS — I890 Lymphedema, not elsewhere classified: Secondary | ICD-10-CM | POA: Insufficient documentation

## 2017-04-29 DIAGNOSIS — R911 Solitary pulmonary nodule: Secondary | ICD-10-CM | POA: Diagnosis not present

## 2017-04-29 DIAGNOSIS — C760 Malignant neoplasm of head, face and neck: Secondary | ICD-10-CM | POA: Diagnosis not present

## 2017-04-29 LAB — GLUCOSE, CAPILLARY: GLUCOSE-CAPILLARY: 106 mg/dL — AB (ref 65–99)

## 2017-04-29 MED ORDER — FLUDEOXYGLUCOSE F - 18 (FDG) INJECTION
9.3200 | Freq: Once | INTRAVENOUS | Status: AC | PRN
  Administered 2017-04-29: 9.32 via INTRAVENOUS

## 2017-04-29 NOTE — Therapy (Signed)
[place], [place], [address] Phone: [phone]   Fax:  [phone]  Physical Therapy Treatment  Patient Details  Name: Terry Pacheco MRN: [id number] Date of Birth: 12/10/1950 Referring Provider: Dr. Isidore Moos   Encounter Date: 04/29/2017  PT End of Session - 04/29/17 1019    Visit Number  12    Number of Visits  15    Date for PT Re-Evaluation  05/18/17    PT Start Time  0934    PT Stop Time  1017    PT Time Calculation (min)  43 min    Activity Tolerance  Patient tolerated treatment well    Behavior During Therapy  [facility] for tasks assessed/performed       Past Medical History:  Diagnosis Date  . Arthritis   . GERD (gastroesophageal reflux disease) 07/06/2014  . History of radiation therapy 12/30/2016- 02/18/2017   Right Tonsil and bilateral neck/ 70 Gy in 35 fractions to gross diseasae, 63 gy in 35 fractions to high risk nodal echelons, and 56 Gy in 35 fraction to intermediate risk nodal echelons.   . Hypertension   . Sleep apnea    does not use CPAP  . Wears glasses     Past Surgical History:  Procedure Laterality Date  . HERNIA REPAIR    . IR CV LINE INJECTION  01/14/2017  . IR FLUORO GUIDE PORT INSERTION RIGHT  12/25/2016  . IR GASTROSTOMY TUBE MOD SED  12/25/2016  . IR REMOVAL TUN ACCESS W/ PORT W/O FL MOD SED  03/06/2017  . IR US GUIDE VASC ACCESS RIGHT  12/25/2016  . polyp removal  2008   during colonoscopy  . TOTAL HIP ARTHROPLASTY Right 10/16/2015   Procedure: RIGHT TOTAL HIP ARTHROPLASTY ANTERIOR APPROACH;  Surgeon: Paralee Cancel, MD;  Location: [facility];  Service: Orthopedics;  Laterality: Right;  . vocal cord polyp      removed 2-3 years ago     There were no vitals filed for this visit.  Subjective Assessment - 04/29/17 0938    Subjective  I am going to call today about my compression garments.     Pertinent History  CT of neck on 11/17/16 showed 7 x 9 mm "cyst" in the right lateral  pharyngeal wall. Pt. with right tonsil squamous cell carcinoma, p16 positive.  Pt has completed chemotherapy and radiation, hx of right THA 2017, HTN    Patient Stated Goals  to get the swelling down    Currently in Pain?  No/denies    Pain Score  0-No pain                      [facility] Adult PT Treatment/Exercise - 04/29/17 0001      Manual Therapy   Manual Therapy  Manual Lymphatic Drainage (MLD)    Manual Lymphatic Drainage (MLD)  short neck, axillary nodes, shoulder collectors, posterior neck, lateral neck and anterior neck then retracing pathways finishing with short neck and axillary nodes                  PT Long Term Goals - 04/20/17 1100      PT LONG TERM GOAL #1   Title  Pt will obtain appropriate compression garment for long term management of lymphedema    Baseline  04/20/17- pt going on Wednesday to pick it up    Time  4    Period  Weeks    Status  On-going      PT LONG TERM GOAL #2   Title  Pt will report 75% improvement in anterior neck swelling to decrease risk of infection    Baseline  04/08/17- pt demonstrates 25% decrease in swelling, 04/20/17- 33%    Time  4    Period  Weeks    Status  On-going      PT LONG TERM GOAL #3   Title  Pt will be independent in a home exercise program for neck stretching to help decrease neck pain with neck ROM    Baseline  04/20/17- issued handout today and pt states he has been doing all exercises on the handout already at home    Time  4    Period  Weeks    Status  Achieved      PT LONG TERM GOAL #4   Title  Pt and/or wife will be independent in self MLD for anterior neck swelling    Baseline  04/20/17- pt's wife is independent with this    Time  4    Period  Weeks    Status  Achieved         Head and Neck Clinic Goals - 01/06/17 2024      Patient will be able to verbalize understanding of a home exercise program for cervical range of motion, posture, and walking.    Status  Achieved       Patient will be able to verbalize understanding of proper sitting and standing posture.    Status  Achieved      Patient will be able to verbalize understanding of lymphedema risk and availability of treatment for this condition.    Status  Achieved        Plan - 04/29/17 1019    Clinical Impression Statement  Pt is going to see if he can pick up his head and neck garment after his session today. Continued with MLD and pt wore his chip pack at end of session. Awaiting on arrival of compression garments to assess fit. Once he recieves these he will be ready for discharge.     Rehab Potential  Good    Clinical Impairments Affecting Rehab Potential  hx of radiation    PT Frequency  1x / week    PT Duration  4 weeks    PT Treatment/Interventions  ADLs/Self Care Home Management;Therapeutic activities;Therapeutic exercise;Manual techniques;Patient/family education;Manual lymph drainage;Compression bandaging;Passive range of motion;Vasopneumatic Device;Taping    PT Next Visit Plan  see if pt obtained head and neck garment,  continue anterior neck MLD and educated in proper technique as needed, give neck ROM exercises    PT Home Exercise Plan  wear chip pack as much as possible, MLD 1-2x/day    Consulted and Agree with Plan of Care  Patient       Patient will benefit from skilled therapeutic intervention in order to improve the following deficits and impairments:  Decreased endurance, Increased edema, Postural dysfunction, Pain  Visit Diagnosis: Lymphedema, not elsewhere classified     Problem List Patient Active Problem List   Diagnosis Date Noted  . Counseling regarding advanced care planning and goals of care 12/22/2016  . Carcinoma of tonsillar fossa ([place]) 12/17/2016  . 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 Osteoarthritis of right hip 04/05/2015  . GAD (generalized anxiety disorder) 04/05/2015  . Metabolic syndrome 85/46/2703  . Obese 04/05/2015  .  GERD (gastroesophageal reflux disease) 07/06/2014  . Hypertension 03/14/2013  .  Hyperlipidemia with target LDL less than 100 03/14/2013    Allyson Sabal [facility] 04/29/2017, 10:20 AM  [place], [place], [address] Phone: [phone]   Fax:  [phone]  Name: Terry Pacheco MRN: [id number] Date of Birth: 04/22/1950  Manus Gunning, PT 04/29/17 10:21 AM

## 2017-04-30 NOTE — Progress Notes (Signed)
HEMATOLOGY/ONCOLOGY FOLLOW UP VISIT NOTE  Date of Service: 05/05/2017  Patient Care Team: Claretta Fraise, MD as PCP - General (Family Medicine) Jodi Marble, MD as Consulting Physician (Otolaryngology) Eppie Gibson, MD as Attending Physician (Radiation Oncology) Leota Sauers, RN as Oncology Nurse Navigator (Oncology) Karie Mainland, RD as Dietitian (Nutrition) Jomarie Longs, PT as Physical Therapist (Physical Therapy) Sharen Counter, CCC-SLP as Speech Language Pathologist (Speech Pathology) Kennith Center, LCSW as Social Worker  CHIEF COMPLAINTS/PURPOSE OF CONSULTATION:  F/u for tonsillar carcinoma  HISTORY OF PRESENTING ILLNESS:   Terry Pacheco is a wonderful 67 y.o. male who has been referred to [place] by ENT Specialist, Dr. Erik Obey for evaluation and management of invasive squamous cell carcinoma of tonsil.   He has a PMHx of GERD, HTN, High cholesterol which he takes medications for. He denies PMHx of seizures. He is accompanied by his wife and sister today. He reports that he is doing well overall. The pt initially presented with sudden-onset right lateral tongue numbness that has been ongoing for approximately 1 year. Subsequently, he had left sided gum swelling and intermittent sore throat symptoms. He notes no stroke-like symptoms. He notes that following these symptoms, he was evaluated by multiple providers including an ENT specialist and a dentist prior to meeting with Dr. Erik Obey. Pt wife noted swelling and discoloration inside his mouth prior to the patient being evaluated by Dr. Erik Obey. At his first visit with Dr. Erik Obey, he noted an abnormality to his left tonsil and completed a biopsy that same day.   He has had CT soft tissue neck with contrast on 11/18/2016 with results showing: Fullness of the right palatine tonsil. Surrounding fat planes are ill-defined. This area is obscured by dental artifact. Possible right tonsillar carcinoma.   The patient has had  biopsy completed on 11/19/2016 with results of: "RIGHT FAUCIAL TONSIL", BIOPSY: Squamous cell carcinoma, suspicious for superficial invasion in this material. Immunostain p16 positive.   He also had a PET scan completed on 12/02/2016 with results revealing: IMPRESSION: 1. Mildly asymmetric tonsillar activity along the right tonsillar sinus, maximum SUV 7.7 as compared to the left side 5.2, suspicious in this setting for right-sided malignancy. There is also a mildly enlarged and hypermetabolic right level IIa lymph node with maximum SUV of 8.4. No evidence of other metastatic spread. 2. Faint accentuation of activity in the left lateral prostate gland apex, but low-grade. Correlate with PSA level in determining whether further workup is warranted. 3. Other imaging findings of potential clinical significance: Aortic Atherosclerosis (ICD10-I70.0) and Emphysema (ICD10-J43.9). Coronary atherosclerosis. Small type 1 hiatal hernia. Bilateral renal cysts. Colonic diverticulosis. Lastly, he had a MR Face trigeminal on 12/09/2016 with results of IMPRESSION: 1. No evidence of perineural spread of malignancy. 2. Mild asymmetry of the palatine tonsils without discrete mass. Correlate with direct visualization.   As far as surgeries, he has had a polyp on left sided throat that was removed 3-4 years ago that resulted negative for malignancy. He also has had a right hip replacement. Lastly, he has had a left inguinal hernia operation in 1978. He started smoking cigarettes at age 11 and would smoke 1 PPD while working. When he retired several years ago he smoked 0.5 PPD and has quit smoking cigarettes 1 month ago since his diagnosis. He states that he used a vape after quitting but was advised to quit via Dr. Isidore Moos. He occasionally consumes ETOH. 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 Denies allergies to medications at this time. He reports that his father died of a stroke at age  92 and his mother died from cardiac issues. Mother had gastric cancer and was diagnosed in late 46's. Denies any other cancers of blood disorders in the family.    On review of systems, he denies hematuria, dysuria, or urinary retention. He reports urinary frequency over the past year. He reports bowel incontinence following a bowel movement x 1 year that has gradually improved more recently. He notes that his bowel incontinence has been intermittent. He has well formed stools without blood in stools or rectal bleeding. He denies increase in bowel movements and he has up to 1 bowel movement daily. He has had 2-3 occurrences of hemorrhoids with no recent flares. He denies mucous in his stools. He denies injuries or surgeries to his rectal area. He denies bladder incontinence. He has lower back pain that has been chronic and unchanged. He has a prior hx of diverticulitis approximately 10 years ago that was followed with colonoscopy and was then diagnosed with diverticulosis. He denies headache at this time. He is able to ambulate for prolonged periods without dyspnea on exertion. 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 He has had an audiogram completed by an ENT specialist in [place], [place]:  Surveillance   INTERIM HISTORY:   Terry Pacheco presents today for scheduled follow up. He is accompanied by his family. Pt notes having a cold /URI  infection at least 4-5 weeks ago. At that time he had cough, congestion and phlegm production. Currently this is resolved. He saw his ENT last month and exam was overall normal.  Discussed his PET/CT results  On review of symptoms, pt notes his taste is still gone and his tongue still hurts some. He also notes a sore throat. He notes his food may get stuck in his throat when he eats too fast. He denies chest pain when breathing. His left neck seems more firm from normal yesterday according to his  wife. She noticed when she was massaging him. He wears a compression sleeve for his lymphedema at least 30 minutes a day. He stopped tube feeding was 04/16/17. He is eating by mouth.    MEDICAL HISTORY:  Past Medical History:  Diagnosis Date  . Arthritis   . GERD (gastroesophageal reflux disease) 07/06/2014  . History of radiation therapy 12/30/2016- 02/18/2017   Right Tonsil and bilateral neck/ 70 Gy in 35 fractions to gross diseasae, 63 gy in 35 fractions to high risk nodal echelons, and 56 Gy in 35 fraction to intermediate risk nodal echelons.   . Hypertension   . Sleep apnea    does not use CPAP  . Wears glasses     SURGICAL HISTORY: Past Surgical History:  Procedure Laterality Date  . HERNIA REPAIR    . IR CV LINE INJECTION  01/14/2017  . IR FLUORO GUIDE PORT INSERTION RIGHT  12/25/2016  . IR GASTROSTOMY TUBE MOD SED  12/25/2016  . IR REMOVAL TUN ACCESS W/ PORT W/O FL MOD SED  03/06/2017  . IR US GUIDE VASC ACCESS RIGHT  12/25/2016  . polyp removal  2008   during colonoscopy  . TOTAL HIP ARTHROPLASTY Right 10/16/2015   Procedure: RIGHT TOTAL HIP ARTHROPLASTY ANTERIOR APPROACH;  Surgeon: Paralee Cancel, MD;  Location: [facility];  Service: Orthopedics;  Laterality: Right;  .  vocal cord polyp      removed 2-3 years ago     SOCIAL HISTORY: Social History   Socioeconomic History  . Marital status: Married    Spouse name: Not on file  . Number of children: 1  . Years of education: Not on file  . Highest education level: Not on file  Social Needs  . Financial resource strain: Not on file  . Food insecurity - worry: Not on file  . Food insecurity - inability: Not on file  . Transportation needs - medical: Not on file  . Transportation needs - non-medical: Not on file  Occupational History  . Occupation: Retired     Fish farm manager: [employer]  Tobacco Use  . Smoking status: Former Smoker    Packs/day: 0.50    Years: 45.00    Pack years: 22.50    Types: Cigarettes    Start  date: 01/12/1983    Last attempt to quit: 11/17/2016    Years since quitting: 0.4  . Smokeless tobacco: Never Used  Substance and Sexual Activity  . Alcohol use: Yes    Alcohol/week: 0.0 oz    Comment: rare  . Drug use: Yes    Types: Marijuana    Comment: last use 10/09/2015  . Sexual activity: Not on file  Other Topics Concern  . Not on file  Social History Narrative  . Not on file    FAMILY HISTORY: Family History  Problem Relation Age of Onset  . Heart disease Mother   . Stroke Father     ALLERGIES:  is allergic to crestor [rosuvastatin].  MEDICATIONS:  Current Outpatient Medications  Medication Sig Dispense Refill  . atorvastatin (LIPITOR) 40 MG tablet TAKE 1 TABLET BY MOUTH EVERY DAY 90 tablet 0  . escitalopram (LEXAPRO) 20 MG tablet TAKE 0.5 TABLETS (10 MG TOTAL) BY MOUTH DAILY. 45 tablet 0  . Nutritional Supplements (FEEDING SUPPLEMENT, OSMOLITE 1.5 CAL,) LIQD Give 1 bottle Osmolite 1.5 via PEG QID with 60 ml free water. Increase to 1.5 bottles QID as tolerated. Increase to goal of  1.5 bottles TID and 2 bottles once daily as tolerated. Give additional 250 mL free water three times daily. 6.5 Bottle 0  . omeprazole (PRILOSEC) 20 MG capsule TAKE 1 CAPSULE BY MOUTH EVERY DAY 90 capsule 0  . sodium fluoride (FLUORISHIELD) 1.1 % GEL dental gel Instill one drop of gel into each tooth space of fluoride tray. Place over teeth for 5 minutes. Remove. Spit out excess. Repeat nightly 120 mL prn  . amLODipine (NORVASC) 10 MG tablet Take 1 tablet (10 mg total) by mouth daily.    Marland Kitchen escitalopram (LEXAPRO) 20 MG tablet TAKE 1/2 TABLET DAILY 15 tablet 0  . lidocaine (XYLOCAINE) 2 % solution Mix 1 part 2%viscous lidocaine,1part H2O.Swish and swallow 87mL of this mixture, 47min before meals and at bedtime, up to QID (Patient not taking: Reported on 03/30/2017) 100 mL 5  . prochlorperazine (COMPAZINE) 10 MG tablet Take 1 tablet (10 mg total) by mouth every 6 (six) hours as needed (Nausea or  vomiting). (Patient not taking: Reported on 05/05/2017) 30 tablet 1   No current facility-administered medications for this visit.     REVIEW OF SYSTEMS:  .10 Point review of Systems was done is negative except as noted above.  PHYSICAL EXAMINATION: ECOG PERFORMANCE STATUS: 1 - Symptomatic but completely ambulatory Vitals:   05/05/17 1405  BP: 118/84  Pulse: 72  Resp: 18  Temp: 98.6 F (37 C)  TempSrc: Oral  SpO2: 97%  Weight: 180 lb 14.4 oz (82.1 kg)  Height: 5\' 9"  (1.753 m)   . GENERAL:alert, in no acute distress and comfortable SKIN: no acute rashes, no significant lesions EYES: conjunctiva are pink and non-injected, sclera anicteric OROPHARYNX: MMM, minimal pharyngeal congestion,  NECK: supple, no JVD LYMPH:  no palpable lymphadenopathy in the cervical, axillary or inguinal regions LUNGS: clear to auscultation b/l with normal respiratory effort HEART: regular rate & rhythm, 2/6 SM ABDOMEN:  normoactive bowel sounds , non tender, not distended. Extremity: no pedal edema PSYCH: alert & oriented x 3 with fluent speech NEURO: no focal motor/sensory deficits      LABORATORY DATA:  I have reviewed the data as listed    CBC Latest Ref Rng & Units 05/05/2017 03/06/2017 03/03/2017  WBC 4.0 - 10.3 K/uL 5.0 4.8 4.2  Hemoglobin 13.0 - 17.0 g/dL - 12.3(L) -  Hematocrit 38.4 - 49.9 % 43.6 36.2(L) 36.7(L)  Platelets 140 - 400 K/uL 126(L) 134(L) 135(L)  HGB 14.5 ANC 2.8k  . CMP Latest Ref Rng & Units 05/05/2017 03/03/2017 02/10/2017  Glucose 70 - 140 mg/dL 83 101 106  BUN 7 - 26 mg/dL 13 13 14.9  Creatinine 0.70 - 1.30 mg/dL 0.96 1.01 1.0  Sodium 136 - 145 mmol/L 137 135(L) 134(L)  Potassium 3.5 - 5.1 mmol/L 4.3 4.5 4.7  Chloride 98 - 109 mmol/L 103 102 -  CO2 22 - 29 mmol/L 28 26 27   Calcium 8.4 - 10.4 mg/dL 9.6 9.5 9.2  Total Protein 6.4 - 8.3 g/dL 6.6 6.6 6.7  Total Bilirubin 0.2 - 1.2 mg/dL 0.5 0.6 0.60  Alkaline Phos 40 - 150 U/L 102 106 121  AST 5 - 34 U/L 14 27 18    ALT 0 - 55 U/L 13 33 23    PATHOLOGY:    RADIOGRAPHIC STUDIES: I have personally reviewed the radiological images as listed and agreed with the findings in the report. Nm Pet Image Restag (ps) Skull Base To Thigh  Result Date: 04/29/2017 CLINICAL DATA:  Subsequent treatment strategy for head neck carcinoma. EXAM: NUCLEAR MEDICINE PET SKULL BASE TO THIGH TECHNIQUE: 9.3 mCi F-18 FDG was injected intravenously. Full-ring PET imaging was performed from the skull base to thigh after the radiotracer. CT data was obtained and used for attenuation correction and anatomic localization. Fasting blood glucose: 106 mg/dl Mediastinal blood pool activity: SUV max 3.0 COMPARISON:  PET-CT 12/02/2016 FINDINGS: NECK: Interval resolution hypermetabolic RIGHT level 2 lymph node seen on comparison exam. No hypermetabolic LEFT or RIGHT cervical lymph nodes. Mild asymmetric metabolic activity in the RIGHT lingual tonsil region likely represents treatment effect. Incidental CT findings: none CHEST: New nodular focus in the RIGHT upper lobe measures 11 mm (image 62, series 4) with mild metabolic activity (SUV max equal 2.8). No additional pulmonary nodules. No hypermetabolic or enlarged mediastinal lymph nodes. Incidental CT findings: Coronary artery calcification and aortic atherosclerotic calcification. ABDOMEN/PELVIS: No abnormal hypermetabolic activity within the liver, pancreas, adrenal glands, or spleen. No hypermetabolic lymph nodes in the abdomen or pelvis. A PEG tube in proper location with retention bulb in stomach Incidental CT findings: Benign cysts of the kidneys Atherosclerotic calcification of the aorta. Diverticulosis of the sigmoid colon. Prostate normal SKELETON: No focal hypermetabolic activity to suggest skeletal metastasis. Incidental CT findings: none IMPRESSION: 1. No enlarged or hypermetabolic cervical lymph nodes. Resolution of RIGHT level II nodal hypermetabolic activity. 2. Mild residual metabolic  activity in the RIGHT tonsil region likely represent treatment effect.  3. New hypermetabolic nodular focus in the LEFT upper lobe. Differential would include small focus of infection versus metastatic lesion. Favor infection. Recommend short-term follow-up CT with contrast (1-2 months). 4. No mediastinal metastatic adenopathy. 5. No distant metastatic disease. 6. Incidental CT findings of atherosclerotic disease and diverticulosis. 7. PEG tube in proper position. Electronically Signed   By: Suzy Bouchard M.D.   On: 04/29/2017 09:52    ASSESSMENT & PLAN:   67 y.o. 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 -creatinine WNL. Some concern for decreased hearing and baseline tinnitus due to previous job noise related injury. Baseline audiogram showed normal symmetric low-frequency hearing, dropping off substantially above 2000Hz .  s/p recent completion of concurrent chemo-radiation with weekly Cisplatin, 12/30/2016 - 02/18/2017  Plan -We reviewed his 04/30/16 PET which shows no enlarged or hypermetabolic activity in cervical lymph nodes and no residual primary focus of head and neck disease. Has new hypermetabolic nodule in upper lobe in left lung. This favors inflammatory process, but will monitor this closely with another CT chest scan in about 2 months.  -Labs reviewed today, CBC improved, CMP WNL -Fever counseling was given, I have advised him to seek immediate emergency medical attention if he were to spike high fevers. No current URI symptoms but did have some recently - resolved.  -I suggest he chews food well before swallowing and doing deep breathing exercises regularly.  -Will discontinue MS Contin and will continue Oxycodone PRN and wean as oral pain  resolves -He stopped tube feeding was 04/16/17. He is eating by mouth now. I encouraged him to continue to drink and eat well.  -Continue magic mouth wash, salt water wash or sucralfate as needed -Continue fu with Dr Isidore Moos as per her recommendation   Ct chest without contrast in 6 weeks RTC with Dr Irene Limbo in 7 weeks with labs   All of the patients questions were answered with apparent satisfaction. The patient knows to call the clinic with any problems, questions or concerns.  . The total time spent in the appointment was 20 minutes and more than 50% was on counseling and direct patient cares.    Sullivan Lone MD MS AAHIVMS [facility] Hematology/Oncology Physician [facility]  (Office):       [phone] (Work cell):  [phone] (Fax):           [phone]  05/05/2017 2:57 PM  This document serves as a record of services personally performed by Sullivan Lone, MD. It was created on his behalf by Joslyn Devon, a trained medical scribe. The creation of this record is based on the scribe's personal observations and the provider's statements to them.    .I have reviewed the above documentation for accuracy and completeness, and I agree with the above. Brunetta Genera MD MS

## 2017-05-05 ENCOUNTER — Inpatient Hospital Stay: Payer: 59 | Attending: Hematology | Admitting: Hematology

## 2017-05-05 ENCOUNTER — Inpatient Hospital Stay: Payer: 59

## 2017-05-05 ENCOUNTER — Other Ambulatory Visit: Payer: Self-pay

## 2017-05-05 ENCOUNTER — Encounter: Payer: Self-pay | Admitting: Nutrition

## 2017-05-05 ENCOUNTER — Encounter: Payer: Self-pay | Admitting: Hematology

## 2017-05-05 VITALS — BP 118/84 | HR 72 | Temp 98.6°F | Resp 18 | Ht 69.0 in | Wt 180.9 lb

## 2017-05-05 DIAGNOSIS — Z931 Gastrostomy status: Secondary | ICD-10-CM | POA: Insufficient documentation

## 2017-05-05 DIAGNOSIS — C090 Malignant neoplasm of tonsillar fossa: Secondary | ICD-10-CM

## 2017-05-05 DIAGNOSIS — I10 Essential (primary) hypertension: Secondary | ICD-10-CM | POA: Diagnosis not present

## 2017-05-05 DIAGNOSIS — Z87891 Personal history of nicotine dependence: Secondary | ICD-10-CM | POA: Insufficient documentation

## 2017-05-05 DIAGNOSIS — R070 Pain in throat: Secondary | ICD-10-CM | POA: Diagnosis not present

## 2017-05-05 DIAGNOSIS — R911 Solitary pulmonary nodule: Secondary | ICD-10-CM

## 2017-05-05 LAB — CBC WITH DIFFERENTIAL (CANCER CENTER ONLY)
Basophils Absolute: 0 10*3/uL (ref 0.0–0.1)
Basophils Relative: 0 %
Eosinophils Absolute: 0.2 10*3/uL (ref 0.0–0.5)
Eosinophils Relative: 5 %
HEMATOCRIT: 43.6 % (ref 38.4–49.9)
Hemoglobin: 14.5 g/dL (ref 13.0–17.1)
LYMPHS PCT: 28 %
Lymphs Abs: 1.4 10*3/uL (ref 0.9–3.3)
MCH: 31 pg (ref 27.2–33.4)
MCHC: 33.3 g/dL (ref 32.0–36.0)
MCV: 93.4 fL (ref 79.3–98.0)
MONO ABS: 0.5 10*3/uL (ref 0.1–0.9)
MONOS PCT: 11 %
NEUTROS ABS: 2.8 10*3/uL (ref 1.5–6.5)
Neutrophils Relative %: 56 %
Platelet Count: 126 10*3/uL — ABNORMAL LOW (ref 140–400)
RBC: 4.67 MIL/uL (ref 4.20–5.82)
RDW: 13 % (ref 11.0–14.6)
WBC Count: 5 10*3/uL (ref 4.0–10.3)

## 2017-05-05 LAB — CMP (CANCER CENTER ONLY)
ALBUMIN: 3.8 g/dL (ref 3.5–5.0)
ALK PHOS: 102 U/L (ref 40–150)
ALT: 13 U/L (ref 0–55)
ANION GAP: 6 (ref 3–11)
AST: 14 U/L (ref 5–34)
BUN: 13 mg/dL (ref 7–26)
CO2: 28 mmol/L (ref 22–29)
Calcium: 9.6 mg/dL (ref 8.4–10.4)
Chloride: 103 mmol/L (ref 98–109)
Creatinine: 0.96 mg/dL (ref 0.70–1.30)
GFR, Est AFR Am: >60 mL/min (ref >60)
GFR, Estimated: >60 mL/min (ref >60)
GLUCOSE: 83 mg/dL (ref 70–140)
POTASSIUM: 4.3 mmol/L (ref 3.5–5.1)
SODIUM: 137 mmol/L (ref 136–145)
Total Bilirubin: 0.5 mg/dL (ref 0.2–1.2)
Total Protein: 6.6 g/dL (ref 6.4–8.3)

## 2017-05-05 MED ORDER — AMLODIPINE BESYLATE 10 MG PO TABS: 10.0000 mg | ORAL_TABLET | Freq: Every day | ORAL | Status: DC

## 2017-05-05 NOTE — Progress Notes (Addendum)
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  Patient has completed radiation and chemotherapy on 02/18/17.    Met with patient today in clinic prior to MD visit.  Patient reports that he has not been using PEG tube for at least the past 3 weeks.  Reports he is changing dressing and flushing with 1 syringe full of water daily (71m).  Reports that he has been trying to eat more orally.  Ate breakfast today for the first time (egg, biscuit, coffee), typically drinks boost plus shake.  Also reports beanie weanies are working well.  Has eaten 1/2 sub sandwich, pineapple upside down cake, taco soup with chicken and beans.  Reports typically he drinks 3 boost plus shakes per day.    Still has lack of taste, dry mouth, swollen tongue from nerve damage, burning with fizzy drinks.  Reports that he is drinking at least 3 (5044m bottles of water per day and peach tea.    Reports continues to use laxative to help with regular bowel movements.  Reports some gagging when doing fluorshield but otherwise no nausea or vomiting.     Medications: MVI  Labs:  reviewed  Anthropometrics:   Weight today in RD clinic 179 lb 6 oz slight decrease from weight of 181 lb on 04/14/17.     Estimated Energy Needs  Kcals: 2200-2400 calories/d Protein: 115-125 g/d Fluid: 2.4 L/d  NUTRITION DIAGNOSIS: Inadequate oral intake continues with decrease in weight although slight   INTERVENTION:  Encouraged patient to continue consuming high calorie, high protein foods.  Patient able to provide examples of foods high in protein. Encouraged patient to continue to consume  3 boost plus shakes for added calories and protein and prevent additional weight loss.  Patient encouraged to continue to flush tube daily with water and change dressing. Patient hopeful he will be able to have tube removed soon. If patient able to maintain weight and hydration for > 2 weeks can safely remove feeding tube.    Patient knows to contact RD with further questions or concerns.  MONITORING, EVALUATION, GOAL: patient will consume adequate calories and protein orally to prevent further weight loss and PEG tube can be removed.   NEXT VISIT: as needed  Mccoy Testa B. AlZenia ResidesRDZayanteLDMorseegistered Dietitian [phone])

## 2017-05-06 ENCOUNTER — Ambulatory Visit: Payer: 59

## 2017-05-06 DIAGNOSIS — I890 Lymphedema, not elsewhere classified: Secondary | ICD-10-CM | POA: Diagnosis not present

## 2017-05-06 NOTE — Therapy (Addendum)
[place], [place], [address] Phone: [phone]   Fax:  [phone]  Physical Therapy Treatment  Patient Details  Name: Terry Pacheco MRN: [id number] Date of Birth: December 26, 1950 Referring Provider: Dr. Isidore Moos   Encounter Date: 05/06/2017  PT End of Session - 05/06/17 1016    Visit Number  13    Number of Visits  15    Date for PT Re-Evaluation  05/18/17    PT Start Time  0936    PT Stop Time  1016    PT Time Calculation (min)  40 min    Activity Tolerance  Patient tolerated treatment well    Behavior During Therapy  [facility] for tasks assessed/performed       Past Medical History:  Diagnosis Date  . Arthritis   . GERD (gastroesophageal reflux disease) 07/06/2014  . History of radiation therapy 12/30/2016- 02/18/2017   Right Tonsil and bilateral neck/ 70 Gy in 35 fractions to gross diseasae, 63 gy in 35 fractions to high risk nodal echelons, and 56 Gy in 35 fraction to intermediate risk nodal echelons.   . Hypertension   . Sleep apnea    does not use CPAP  . Wears glasses     Past Surgical History:  Procedure Laterality Date  . HERNIA REPAIR    . IR CV LINE INJECTION  01/14/2017  . IR FLUORO GUIDE PORT INSERTION RIGHT  12/25/2016  . IR GASTROSTOMY TUBE MOD SED  12/25/2016  . IR REMOVAL TUN ACCESS W/ PORT W/O FL MOD SED  03/06/2017  . IR US GUIDE VASC ACCESS RIGHT  12/25/2016  . polyp removal  2008   during colonoscopy  . TOTAL HIP ARTHROPLASTY Right 10/16/2015   Procedure: RIGHT TOTAL HIP ARTHROPLASTY ANTERIOR APPROACH;  Surgeon: Paralee Cancel, MD;  Location: [facility];  Service: Orthopedics;  Laterality: Right;  . vocal cord polyp      removed 2-3 years ago     There were no vitals filed for this visit.  Subjective Assessment - 05/06/17 0939    Subjective  Got my garment and I've tried wearing it a few times but I want you to check it. My wife has been doing my massage for me.     Pertinent History   CT of neck on 11/17/16 showed 7 x 9 mm "cyst" in the right lateral pharyngeal wall. Pt. with right tonsil squamous cell carcinoma, p16 positive.  Pt has completed chemotherapy and radiation, hx of right THA 2017, HTN    Patient Stated Goals  to get the swelling down    Currently in Pain?  No/denies            LYMPHEDEMA/ONCOLOGY QUESTIONNAIRE - 05/06/17 0944      Head and Neck   4 cm superior to sternal notch around neck  42.6 cm    6 cm superior to sternal notch around neck  43 cm    8 cm superior to sternal notch around neck  43.4 cm    Other  44.5 cm at 10 cm superior to sternal notch around neck               [facility] Adult PT Treatment/Exercise - 05/06/17 0001      Manual Therapy   Manual Therapy  Manual Lymphatic Drainage (MLD)    Edema Management  Pt brought new head/neck compression garment. Fitted this to him instructing him in same. He was able to return demonstration after min VCs  and found this to be a good fit.     Manual Lymphatic Drainage (MLD)  short neck supraclavicular fossae, bil axillary nodes, shoulder collectors, 5 diapragmatic breaths, bil upper quadrants, posterior neck, lateral neck and anterior neck, submental and submandibular nodes, also pre and retroauricular nodes, then retracing pathways finishing with short neck and axillary nodes                  PT Long Term Goals - 05/06/17 0949      PT LONG TERM GOAL #1   Title  Pt will obtain appropriate compression garment for long term management of lymphedema    Baseline  04/20/17- pt going on Wednesday to pick it up; received this and able to don independently afer instruction-05/06/17    Status  Achieved      PT LONG TERM GOAL #2   Title  Pt will report 75% improvement in anterior neck swelling to decrease risk of infection    Baseline  04/08/17- pt demonstrates 25% decrease in swelling, 04/20/17- 33%; 40% - 05/06/17    Status  Partially Met      PT LONG TERM GOAL #3   Title  Pt will be  independent in a home exercise program for neck stretching to help decrease neck pain with neck ROM    Baseline  04/20/17- issued handout today and pt states he has been doing all exercises on the handout already at home    Status  Achieved      PT [place] #4   Title  Pt and/or wife will be independent in self MLD for anterior neck swelling    Baseline  04/20/17- pt's wife is independent with this    Status  Achieved         Head and Neck Clinic Goals - 01/06/17 2024      Patient will be able to verbalize understanding of a home exercise program for cervical range of motion, posture, and walking.    Status  Achieved      Patient will be able to verbalize understanding of proper sitting and standing posture.    Status  Achieved      Patient will be able to verbalize understanding of lymphedema risk and availability of treatment for this condition.    Status  Achieved        Plan - 05/06/17 1017    Clinical Impression Statement  Pt has done excellent with therapy and has now received his compression garment After assessing found to be a good fit and pt was able to don independently after instruction. All goals are met and he is ready for D/C at this time.     Rehab Potential  Good    Clinical Impairments Affecting Rehab Potential  hx of radiation    PT Frequency  1x / week    PT Duration  4 weeks    PT Treatment/Interventions  ADLs/Self Care Home Management;Therapeutic activities;Therapeutic exercise;Manual techniques;Patient/family education;Manual lymph drainage;Compression bandaging;Passive range of motion;Vasopneumatic Device;Taping    PT Next Visit Plan  D/C this visit.     PT Home Exercise Plan  wear chip pack as much as possible, MLD 1-2x/day    Consulted and Agree with Plan of Care  Patient       Patient will benefit from skilled therapeutic intervention in order to improve the following deficits and impairments:  Decreased endurance, Increased  edema, Postural dysfunction, Pain  Visit Diagnosis: Lymphedema, not elsewhere classified  Problem List Patient Active Problem List   Diagnosis Date Noted  . Counseling regarding advanced care planning and goals of care 12/22/2016  . Carcinoma of tonsillar fossa ([place]) 12/17/2016  . 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 Osteoarthritis of right hip 04/05/2015  . GAD (generalized anxiety disorder) 04/05/2015  . Metabolic syndrome 11/65/7903  . Obese 04/05/2015  . GERD (gastroesophageal reflux disease) 07/06/2014  . Hypertension 03/14/2013  . Hyperlipidemia with target LDL less than 100 03/14/2013    Otelia Limes, PTA 05/06/2017, 10:21 AM  [place], [place], [address] Phone: [phone]   Fax:  [phone]  Name: Terry Pacheco MRN: [id number] Date of Birth: 01-22-1951  PHYSICAL THERAPY DISCHARGE SUMMARY  Visits from Start of Care: 13  Current functional level related to goals / functional outcomes: See above   Remaining deficits: See above   Education / Equipment: HEP, head and neck garment  Plan: Patient agrees to discharge.  Patient goals were met. Patient is being discharged due to meeting the stated rehab goals.  ?????    Allyson Sabal [place], [place] 05/11/17 2:55 PM

## 2017-05-08 ENCOUNTER — Telehealth: Payer: Self-pay

## 2017-05-08 ENCOUNTER — Other Ambulatory Visit: Payer: Self-pay | Admitting: Family Medicine

## 2017-05-08 NOTE — Telephone Encounter (Signed)
Spoke with wife concerning upcoming appointment. She requested a later date and time. Per 3/12 los

## 2017-05-08 NOTE — Telephone Encounter (Signed)
Seen [phone] 

## 2017-05-08 NOTE — Telephone Encounter (Signed)
Authorize 30 days only. Then contact the patient letting them know that they will need an appointment before any further prescriptions can be sent in. 

## 2017-05-13 ENCOUNTER — Encounter: Payer: Self-pay | Admitting: Physical Therapy

## 2017-05-20 ENCOUNTER — Encounter: Payer: Self-pay | Admitting: Physical Therapy

## 2017-05-27 NOTE — Progress Notes (Signed)
Mr. Fayette presents for follow up of radiation completed 02/18/17 to his Right tonsil and bilateral neck.   Pain issues, if any: He reports chronic pain to his throat at 6/10 Using a feeding tube?: No Weight changes, if any:  Wt Readings from Last 3 Encounters:  05/29/17 176 lb 6.4 oz (80 kg)  05/05/17 180 lb 14.4 oz (82.1 kg)  05/05/17 179 lb 6 oz (81.4 kg)   Swallowing issues, if any: Yes, he has pain in his throat. He is able to eat soups, beans, and foods that are moist. He has difficulty with breads, and drier foods, and food with any type of spice to it.  Smoking or chewing tobacco? No Using fluoride trays daily? Yes Last ENT visit was on: Dr. Erik Obey 06/28/63 Other notable issues, if any:  PET 04/29/17, saw Dr. Irene Limbo 05/05/17 for results. Will have a CT Chest done 06/16/17 to evaluate Left Lung nodule and see Dr. Irene Limbo next 06/30/17.  He reports a dry mouth. He also has some edema to his anterior neck.  BP 110/76   Pulse 84   Temp 98.4 F (36.9 C)   Ht 5\' 9"  (1.753 m)   Wt 176 lb 6.4 oz (80 kg)   SpO2 97% Comment: room air  BMI 26.05 kg/m

## 2017-05-29 ENCOUNTER — Ambulatory Visit
Admission: RE | Admit: 2017-05-29 | Discharge: 2017-05-29 | Disposition: A | Discharge status: Home / Self Care | Payer: 59 | Source: Ambulatory Visit | Attending: Radiation Oncology | Admitting: Radiation Oncology

## 2017-05-29 ENCOUNTER — Encounter: Payer: Self-pay | Admitting: *Deleted

## 2017-05-29 ENCOUNTER — Encounter: Payer: Self-pay | Admitting: Radiation Oncology

## 2017-05-29 ENCOUNTER — Other Ambulatory Visit: Payer: Self-pay

## 2017-05-29 VITALS — BP 110/76 | HR 84 | Temp 98.4°F | Ht 69.0 in | Wt 176.4 lb

## 2017-05-29 DIAGNOSIS — Z1329 Encounter for screening for other suspected endocrine disorder: Secondary | ICD-10-CM

## 2017-05-29 DIAGNOSIS — C099 Malignant neoplasm of tonsil, unspecified: Secondary | ICD-10-CM | POA: Diagnosis not present

## 2017-05-29 DIAGNOSIS — Z8719 Personal history of other diseases of the digestive system: Secondary | ICD-10-CM | POA: Insufficient documentation

## 2017-05-29 DIAGNOSIS — Z79899 Other long term (current) drug therapy: Secondary | ICD-10-CM | POA: Insufficient documentation

## 2017-05-29 DIAGNOSIS — Z923 Personal history of irradiation: Secondary | ICD-10-CM | POA: Diagnosis not present

## 2017-05-29 DIAGNOSIS — G893 Neoplasm related pain (acute) (chronic): Secondary | ICD-10-CM | POA: Insufficient documentation

## 2017-05-29 DIAGNOSIS — I2510 Atherosclerotic heart disease of native coronary artery without angina pectoris: Secondary | ICD-10-CM | POA: Diagnosis not present

## 2017-05-29 DIAGNOSIS — C090 Malignant neoplasm of tonsillar fossa: Secondary | ICD-10-CM

## 2017-05-29 DIAGNOSIS — Z931 Gastrostomy status: Secondary | ICD-10-CM | POA: Insufficient documentation

## 2017-05-29 DIAGNOSIS — R911 Solitary pulmonary nodule: Secondary | ICD-10-CM | POA: Insufficient documentation

## 2017-05-29 NOTE — Progress Notes (Signed)
Radiation Oncology         [phone] ________________________________  Name: Terry Pacheco MRN: [id number]  Date: 05/29/2017  DOB: September 21, 1950  Follow-Up Visit Note  CC: Claretta Fraise, MD  Jodi Marble, MD  Diagnosis and Prior Radiotherapy:     No diagnosis found. Stage I (cT2, cN1, cM0) Right Tonsillar Squamous Cell Carcinoma     Radiation treatment dates:   12/30/2016 - 02/18/2017  Site/dose:     Right tonsil and bilateral neck / 70 Gy in 35 fractions to gross disease, 63 Gy in 35 fractions to high risk nodal echelons, and 56 Gy in 35 fractions to intermediate risk nodal echelons  CHIEF COMPLAINT:  Here for follow-up and surveillance of his right tonsillar squamous cell carcinoma  Narrative:  The patient returns today for routine follow-up.  He is accompanied by his wife and another close family/friend. He is doing well overall.   Pain issues, if any: He reports chronic pain to his throat at 6/10 Using a feeding tube?: No Weight changes, if any:  Wt Readings from Last 3 Encounters:  05/29/17 176 lb 6.4 oz (80 kg)  05/05/17 180 lb 14.4 oz (82.1 kg)  05/05/17 179 lb 6 oz (81.4 kg)   Swallowing issues, if any: Yes, he has pain in his throat. He is able to eat soups, beans, and foods that are moist. He has difficulty with breads, and drier foods, and food with any type of spice to it.  Smoking or chewing tobacco? No Using fluoride trays daily? Yes and scheduling dental f/u soon. Last ENT visit was on: Dr. Erik Obey 0/1/75 Other notable issues, if any:  PET 04/29/17, saw Dr. Irene Limbo 05/05/17 for results. Will have a CT Chest done 06/16/17 to evaluate Left Lung nodule and see Dr. Irene Limbo next 06/30/17.  He reports a dry mouth. Uses biotene.  He also has some edema to his anterior neck. Has seen PT for this.  Since his last visit to the office, he underwent a restaging PET scan on 04/29/2017 with results revealing: No enlarged or hypermetabolic cervical lymph nodes. Resolution of RIGHT level  II nodal hypermetabolic activity. Mild residual metabolic activity in the RIGHT tonsil region likely represent treatment effect. New hypermetabolic nodular focus in the LEFT upper lobe. Differential would include small focus of infection versus metastatic lesion. Favor infection. Recommend short-term follow-up CT with contrast (1-2 months). No mediastinal metastatic adenopathy. No distant metastatic disease. Incidental CT findings of atherosclerotic disease and diverticulosis. PEG tube in proper position.  I reviewed this w/ tumor board team.   ALLERGIES:  is allergic to crestor [rosuvastatin].  Meds: Current Outpatient Medications  Medication Sig Dispense Refill  . amLODipine (NORVASC) 10 MG tablet Take 1 tablet (10 mg total) by mouth daily.    Marland Kitchen atorvastatin (LIPITOR) 40 MG tablet TAKE 1 TABLET BY MOUTH EVERY DAY 90 tablet 0  . escitalopram (LEXAPRO) 20 MG tablet TAKE 0.5 TABLETS (10 MG TOTAL) BY MOUTH DAILY. 45 tablet 0  . omeprazole (PRILOSEC) 20 MG capsule TAKE 1 CAPSULE BY MOUTH EVERY DAY 90 capsule 0  . sodium fluoride (FLUORISHIELD) 1.1 % GEL dental gel Instill one drop of gel into each tooth space of fluoride tray. Place over teeth for 5 minutes. Remove. Spit out excess. Repeat nightly 120 mL prn  . lidocaine (XYLOCAINE) 2 % solution Mix 1 part 2%viscous lidocaine,1part H2O.Swish and swallow 25mL of this mixture, 16min before meals and at bedtime, up to QID (Patient not taking: Reported on 03/30/2017) 100  mL 5  . Nutritional Supplements (FEEDING SUPPLEMENT, OSMOLITE 1.5 CAL,) LIQD Give 1 bottle Osmolite 1.5 via PEG QID with 60 ml free water. Increase to 1.5 bottles QID as tolerated. Increase to goal of  1.5 bottles TID and 2 bottles once daily as tolerated. Give additional 250 mL free water three times daily. (Patient not taking: Reported on 05/29/2017) 6.5 Bottle 0  . prochlorperazine (COMPAZINE) 10 MG tablet Take 1 tablet (10 mg total) by mouth every 6 (six) hours as needed (Nausea or  vomiting). (Patient not taking: Reported on 05/05/2017) 30 tablet 1   No current facility-administered medications for this encounter.     Physical Findings: The patient is in no acute distress. Patient is alert and oriented. Wt Readings from Last 3 Encounters:  05/29/17 176 lb 6.4 oz (80 kg)  05/05/17 180 lb 14.4 oz (82.1 kg)  05/05/17 179 lb 6 oz (81.4 kg)    height is 5\' 9"  (1.753 m) and weight is 176 lb 6.4 oz (80 kg). His temperature is 98.4 F (36.9 C). His blood pressure is 110/76 and his pulse is 84. His oxygen saturation is 97%. .  General: Alert and oriented, in no acute distress HEENT: Head is normocephalic.   Mucous membranes are dry. No palpable lesions in tongue or oropharynx Skin: healed relatively well from radiation.    Neck: Neck is notable for no palpable masses. Heart: RRR; +aortic murmur Chest: CTAB  Psychiatric: Judgment and insight are intact. Affect is appropriate.   Lab Findings: Lab Results  Component Value Date   WBC 5.0 05/05/2017   HGB 12.3 (L) 03/06/2017   HCT 43.6 05/05/2017   MCV 93.4 05/05/2017   PLT 126 (L) 05/05/2017    Lab Results  Component Value Date   TSH 2.099 12/30/2016    Radiographic Findings: No results found.  Impression/Plan:    1) Head and Neck Cancer Status: No evidence of disease.  Gayleen Orem, RN, our Head and Neck Oncology Navigator was present for visit. He will arrange ENT f/u in 43mo for a physical exam to verify no local tumor recurrence.  Exam excellent today.  CT Chest will be later this month for surveillance; Liliane Channel will place on tumor board for discussion  2) Nutritional Status: stable, ordering PEG removal (he isn't using it)  3) Risk Factors: The patient has been educated about risk factors including alcohol and tobacco abuse; they understand that avoidance of alcohol and tobacco is important to prevent recurrences as well as other cancers  4) Swallowing: Stable. Continue swallowing exercises.  5) Dental:  Encouraged to continue regular followup with dentistry, and dental hygiene including fluoride rinses.    6) Thyroid function: I will ask Dr Irene Limbo to continue screening this annually. I will order one for next visit. Lab Results  Component Value Date   TSH 2.099 12/30/2016    7) pt knows to discuss heart murmur w/ PCP  8) Follow-up w/ me PRN.  Continue f/u with med Adella Nissen and ENT.    Eppie Gibson, MD  This document serves as a record of services personally performed by Eppie Gibson, MD. It was created on her behalf by Steva Colder, a trained medical scribe. The creation of this record is based on the scribe's personal observations and the provider's statements to them. This document has been checked and approved by the attending provider.

## 2017-06-01 ENCOUNTER — Encounter: Payer: Self-pay | Admitting: Pediatrics

## 2017-06-01 ENCOUNTER — Telehealth: Payer: Self-pay | Admitting: *Deleted

## 2017-06-01 ENCOUNTER — Ambulatory Visit (INDEPENDENT_AMBULATORY_CARE_PROVIDER_SITE_OTHER): Payer: 59 | Admitting: Pediatrics

## 2017-06-01 VITALS — BP 113/83 | HR 77 | Temp 97.5°F | Ht 69.0 in | Wt 175.6 lb

## 2017-06-01 DIAGNOSIS — J0100 Acute maxillary sinusitis, unspecified: Secondary | ICD-10-CM

## 2017-06-01 MED ORDER — FLUTICASONE PROPIONATE 50 MCG/ACT NA SUSP: 2.0000 | Freq: Every day | NASAL | 0 refills | Status: DC

## 2017-06-01 MED ORDER — AMOXICILLIN-POT CLAVULANATE 875-125 MG PO TABS: 1.0000 | ORAL_TABLET | Freq: Two times a day (BID) | ORAL | 0 refills | Status: DC

## 2017-06-01 MED ORDER — AMOXICILLIN-POT CLAVULANATE 875-125 MG PO TABS
1.0000 | ORAL_TABLET | Freq: Two times a day (BID) | ORAL | 0 refills | Status: AC
Start: 2017-06-01 — End: 2017-06-08

## 2017-06-01 MED ORDER — FLUTICASONE PROPIONATE 50 MCG/ACT NA SUSP
2.0000 | Freq: Every day | NASAL | 0 refills | Status: DC
Start: 2017-06-01 — End: 2017-06-01

## 2017-06-01 NOTE — Progress Notes (Signed)
Oncology Nurse Navigator Documentation  To provide support, encouragement and care continuity, met with Mr. Guyton and his wife during f/u with Dr. Isidore Moos. He reported:  Has not used PEG since late February, still flushing with water daily.    Dental appt pending, using fluoride trays.  Conducting SLP HEP with regularity,  Difficulty eating bread, meat.  Still experiencing burning in mouth/throat. Interventions:  Dr. Isidore Moos to order PEG removal.  Encouraged by navigator to conduct HEP twice daily for 6 months s/p EOT as instructed by SLP Schinke; to keep f/u appts as scheduled.  Re-educated by navigator and Dr. Isidore Moos: dry mouth expected to be long lasting SE of RT; bread and meat better eaten small amounts; burning in mouth/throat will resolve with time; taste sensation will normalize.  Navigator to coordinate follow-up with ENT Dr. Erik Obey in 1 month. They voiced understanding future follow-ups will alternate between Drs. Kale and ENT [facility], to include chest CT prior to next appt with Dr. Irene Limbo. I encouraged them to call me with needs/concerns.  Gayleen Orem, RN, BSN Head & Neck Oncology Nurse [place] at [place] [phone]

## 2017-06-01 NOTE — Progress Notes (Signed)
  Subjective:   Patient ID: Terry Pacheco, male    DOB: November 09, 1950, 67 y.o.   MRN: [id number] CC: Dizziness; Headache; and Ear Pain  HPI: Terry Pacheco is a 67 y.o. male presenting for Dizziness; Headache; and Ear Pain   Started getting sick 2 weeks ago, has been worse the last 2 days. Feeling lots of pressure in his sinuses.  His ears have been hurting.  Was told that they were both clogged with his oncologist looked at them.  No fevers.  Appetite has been down since treatment for his cancer.  Threw up twice a few days ago which is unusual for him.  He has been tolerating food ok up until then.  Lots of nasal congestion.  No fevers.  Not taking anything at home.  Relevant past medical, surgical, family and social history reviewed. Allergies and medications reviewed and updated. Social History   Tobacco Use  Smoking Status Former Smoker  . Packs/day: 0.50  . Years: 45.00  . Pack years: 22.50  . Types: Cigarettes  . Start date: 01/12/1983  . Last attempt to quit: 11/17/2016  . Years since quitting: 0.5  Smokeless Tobacco Never Used   ROS: Per HPI   Objective:    BP 113/83   Pulse 77   Temp (!) 97.5 F (36.4 C) (Oral)   Ht 5\' 9"  (1.753 m)   Wt 175 lb 9.6 oz (79.7 kg)   BMI 25.93 kg/m   Wt Readings from Last 3 Encounters:  06/01/17 175 lb 9.6 oz (79.7 kg)  05/29/17 176 lb 6.4 oz (80 kg)  05/05/17 180 lb 14.4 oz (82.1 kg)    Gen: NAD, alert, cooperative with exam, NCAT EYES: EOMI, no conjunctival injection, or no icterus ENT:  TMs pearly gray b/l, OP without erythema LYMPH: no cervical LAD CV: NRRR, normal S1/S2, no murmur, distal pulses 2+ b/l Resp: CTABL, no wheezes, normal WOB Abd: +BS, soft, NTND. no guarding or organomegaly Ext: No edema, warm Neuro: Alert and oriented  Assessment & Plan:  Terry Pacheco was seen today for dizziness, headache and ear pain.  Diagnoses and all orders for this visit:  Acute maxillary sinusitis, recurrence not specified -      amoxicillin-clavulanate (AUGMENTIN) 875-125 MG tablet; Take 1 tablet by mouth 2 (two) times daily for 7 days. -     fluticasone (FLONASE) 50 MCG/ACT nasal spray; Place 2 sprays into both nostrils daily.   Follow up plan: Return if symptoms worsen or fail to improve. Assunta Found, MD [place]

## 2017-06-01 NOTE — Patient Instructions (Addendum)
Fever reducer and headache: tylenol   Sinus pressure:  Nasal steroid such as flonase/fluticaone or nasocort daily Can also take daily antihistamine such as loratadine/claritin or cetirizine/zyrtec  Sinus rinses/irritation: Netipot or similar with distilled water 2-3 times a day to clear out sinuses or Normal saline nasal spray

## 2017-06-01 NOTE — Telephone Encounter (Signed)
Oncology Nurse Navigator Documentation  Per Dr. Pearlie Oyster guidance, called [facility] ENT to arrange appointment.  Spoke with [place], requested patient be contacted and routine post-RT follow-up appt arranged in 1 month with Dr. Erik Obey.   She verbalized understanding.  Gayleen Orem, RN, BSN Head & Neck Oncology Nurse [place] at [place] [phone]

## 2017-06-03 ENCOUNTER — Telehealth: Payer: Self-pay

## 2017-06-03 NOTE — Telephone Encounter (Signed)
Staff message sent to Dr. Irene Limbo to call pt and wife to notify of scan results on 06/22/17. Staff message sent to Dr. Isidore Moos to note results. Left VM with pt and wife to let them know to expect results call on 06/22/17.

## 2017-06-04 ENCOUNTER — Telehealth: Payer: Self-pay | Admitting: *Deleted

## 2017-06-04 NOTE — Telephone Encounter (Signed)
Called patient to inform of peg tube removal and lab for 06-05-17, lvm for a return call

## 2017-06-05 ENCOUNTER — Ambulatory Visit (HOSPITAL_COMMUNITY)
Admission: RE | Admit: 2017-06-05 | Discharge: 2017-06-05 | Disposition: A | Discharge status: Home / Self Care | Payer: 59 | Source: Ambulatory Visit | Attending: Radiation Oncology | Admitting: Radiation Oncology

## 2017-06-05 ENCOUNTER — Encounter (HOSPITAL_COMMUNITY): Payer: Self-pay | Admitting: Radiology

## 2017-06-05 ENCOUNTER — Ambulatory Visit
Admission: RE | Admit: 2017-06-05 | Discharge: 2017-06-05 | Disposition: A | Discharge status: Home / Self Care | Payer: 59 | Source: Ambulatory Visit | Attending: Radiation Oncology | Admitting: Radiation Oncology

## 2017-06-05 DIAGNOSIS — Z85818 Personal history of malignant neoplasm of other sites of lip, oral cavity, and pharynx: Secondary | ICD-10-CM | POA: Diagnosis not present

## 2017-06-05 DIAGNOSIS — Z431 Encounter for attention to gastrostomy: Secondary | ICD-10-CM | POA: Diagnosis not present

## 2017-06-05 DIAGNOSIS — Z1329 Encounter for screening for other suspected endocrine disorder: Secondary | ICD-10-CM

## 2017-06-05 DIAGNOSIS — C099 Malignant neoplasm of tonsil, unspecified: Secondary | ICD-10-CM | POA: Diagnosis not present

## 2017-06-05 DIAGNOSIS — C090 Malignant neoplasm of tonsillar fossa: Secondary | ICD-10-CM

## 2017-06-05 HISTORY — PX: IR GASTROSTOMY TUBE REMOVAL: IMG5492

## 2017-06-05 LAB — TSH: TSH: 0.673 u[IU]/mL (ref 0.320–4.118)

## 2017-06-05 MED ORDER — LIDOCAINE VISCOUS 2 % MT SOLN
OROMUCOSAL | Status: AC | PRN
  Administered 2017-06-05: 15 mL via OROMUCOSAL

## 2017-06-05 MED ORDER — LIDOCAINE VISCOUS 2 % MT SOLN
OROMUCOSAL | Status: AC
  Filled 2017-06-05: qty 15

## 2017-06-05 NOTE — Procedures (Signed)
Per order of radiation oncologist, patient's gastrostomy tube was removed in its entirety without immediate complications.  Medication used-viscous lidocaine to insertion site tract.  Gauze dressing applied over site.  Site care instructions were reviewed with patient and wife.

## 2017-06-08 ENCOUNTER — Ambulatory Visit: Payer: 59 | Attending: Radiation Oncology

## 2017-06-08 DIAGNOSIS — R1310 Dysphagia, unspecified: Secondary | ICD-10-CM | POA: Diagnosis present

## 2017-06-08 NOTE — Patient Instructions (Signed)
My number is at the bottom of the exercises should you need it. With signs/symptoms of aspiration pneumonia, contact your family MD or Dr. Erik Obey.

## 2017-06-09 NOTE — Therapy (Signed)
[facility] [address] [place], [place], [address] Phone: [phone]   Fax:  [phone]  Speech Language Pathology Treatment/Discharge  Patient Details  Name: Terry Pacheco MRN: [id number] Date of Birth: 07-07-1950 Referring Provider: Eppie Gibson, MD   Encounter Date: 06/08/2017  End of Session - 06/08/17 1002    Visit Number  5    Number of Visits  5    Date for SLP Re-Evaluation  06/08/17    SLP Start Time  0933    SLP Stop Time   55    SLP Time Calculation (min)  32 min    Activity Tolerance  Patient tolerated treatment well       Past Medical History:  Diagnosis Date  . Arthritis   . GERD (gastroesophageal reflux disease) 07/06/2014  . History of radiation therapy 12/30/2016- 02/18/2017   Right Tonsil and bilateral neck/ 70 Gy in 35 fractions to gross diseasae, 63 gy in 35 fractions to high risk nodal echelons, and 56 Gy in 35 fraction to intermediate risk nodal echelons.   . Hypertension   . Sleep apnea    does not use CPAP  . Wears glasses     Past Surgical History:  Procedure Laterality Date  . HERNIA REPAIR    . IR CV LINE INJECTION  01/14/2017  . IR FLUORO GUIDE PORT INSERTION RIGHT  12/25/2016  . IR GASTROSTOMY TUBE MOD SED  12/25/2016  . IR GASTROSTOMY TUBE REMOVAL  06/05/2017  . IR REMOVAL TUN ACCESS W/ PORT W/O FL MOD SED  03/06/2017  . IR US GUIDE VASC ACCESS RIGHT  12/25/2016  . polyp removal  2008   during colonoscopy  . TOTAL HIP ARTHROPLASTY Right 10/16/2015   Procedure: RIGHT TOTAL HIP ARTHROPLASTY ANTERIOR APPROACH;  Surgeon: Paralee Cancel, MD;  Location: [facility];  Service: Orthopedics;  Laterality: Right;  . vocal cord polyp      removed 2-3 years ago     There were no vitals filed for this visit.  Subjective Assessment - 06/08/17 0945    Subjective  Pt with notable lymphedema. "I need ot wear that compression bandage more." "I went to a sub shop yesterday and had to give up the  bread."    Currently in Pain?  No/denies            ADULT SLP TREATMENT - 06/08/17 0951      General Information   Behavior/Cognition  Alert;Cooperative;Pleasant mood      Treatment Provided   Treatment provided  Dysphagia      Dysphagia Treatment   Temperature Spikes Noted  No    Respiratory Status  Room air    Oral Cavity - Dentition  Adequate natural dentition    Treatment Methods  Skilled observation;Therapeutic exercise;Patient/caregiver education    Patient observed directly with PO's  Yes    Type of PO's observed  Dysphagia 3 (soft);Thin liquids    Oral Phase Signs & Symptoms  -- none    Pharyngeal Phase Signs & Symptoms  -- none    Other treatment/comments  "I've been doing the exercises every day." Pt completed exercises with SBA/independence. Dys III and thin without overt s/s aspiration today. Pt without overt s/s aspiraiton PNA to date, and none reported. Pt agrees with d/c today.      Assessment / Recommendations / Plan   Plan  -- d/c - pt completed HEP with SBA/Independnece      Progression Toward Goals   Progression  toward goals  -- d/c day- see goals; pt without overt s/s aspiration with POs         SLP Short Term Goals - 03/16/17 1311      SLP SHORT TERM GOAL #1   Title  pt will complete HEP with rare min A     Status  Achieved      SLP SHORT TERM GOAL #2   Title  pt will tell SLP why he is completing HEP     Status  Achieved      SLP SHORT TERM GOAL #3   Title  pt will tell SLP 3 overt s/s aspiration PNA with modified independence    Status  Achieved       SLP Long Term Goals - 06/08/17 1010      SLP LONG TERM GOAL #1   Title  pt will tell SLP why food journal can be helpful in returning to least restrictive food consistencies    Status  Achieved      SLP LONG TERM GOAL #2   Title  pt will complete HEP with modified independence over three sessions    Period  -- visits    Status  Partially Met 2/3 sessions      SLP LONG TERM GOAL #3    Title  pt will tell SLP when HEP frequency can be reduced to x2-3/week    Status  Achieved       Plan - 06/08/17 1009    Clinical Impression Statement  Pt with oropharyngeal swallowing WNL for dys III/thin, however the probability of swallowing difficulty remains and heightens after chemo and radiation therapy. Pt reports he is comfortable maintaining his HEP at home, and completes his exercises today indpendently. Pt will be d/c'd by SLP at this time.     Treatment/Interventions  Aspiration precaution training;Pharyngeal strengthening exercises;Diet toleration management by SLP;Environmental controls;Compensatory techniques;Cueing hierarchy;SLP instruction and feedback;Multimodal communcation approach;Internal/external aids;Trials of upgraded texture/liquids;Patient/family education any or all will be used    Potential to Achieve Goals  Good    SLP Home Exercise Plan  provided today    Consulted and Agree with Plan of Care  Patient       Patient will benefit from skilled therapeutic intervention in order to improve the following deficits and impairments:   Dysphagia, unspecified type   SPEECH THERAPY DISCHARGE SUMMARY  Visits from Start of Care: 5  Current functional level related to goals / functional outcomes: Pt is satisfied with his current functional level, eating dys III and some regular diet, with thin liquids. No overt s/s aspiration today. No overt s/s aspiration PNA to date.  He has remained compliant with his HEP and states he will cont to perform until July, at which time he will consider decreasing frequency to 2-3 times a week, but reports he may just continue every day.   Remaining deficits: Mild pharyngeal dysphagia primarily with drier or tougher items   Education / Equipment: HEP for swallowing, late effects radiation on swallowing, overt s/s aspiration PNA, food journal.   Plan: Patient agrees to discharge.  Patient goals were partially met. Patient is being  discharged due to being pleased with the current functional level.  ?????       Problem List Patient Active Problem List   Diagnosis Date Noted  . Counseling regarding advanced care planning and goals of care 12/22/2016  . Carcinoma of tonsillar fossa ([place]) 12/17/2016  . 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  Osteoarthritis of right hip 04/05/2015  . GAD (generalized anxiety disorder) 04/05/2015  . Metabolic syndrome 29/92/4268  . Obese 04/05/2015  . GERD (gastroesophageal reflux disease) 07/06/2014  . Hypertension 03/14/2013  . Hyperlipidemia with target LDL less than 100 03/14/2013    SCHINKE,CARL ,MS, CCC-SLP  06/09/2017, 9:23 AM  [facility] [address] [place], [place], [address] Phone: [phone]   Fax:  [phone]   Name: Terry Pacheco MRN: [id number] Date of Birth: 12-22-1950

## 2017-06-16 ENCOUNTER — Encounter (HOSPITAL_COMMUNITY): Payer: Self-pay

## 2017-06-16 ENCOUNTER — Ambulatory Visit (HOSPITAL_COMMUNITY)
Admission: RE | Admit: 2017-06-16 | Discharge: 2017-06-16 | Disposition: A | Discharge status: Home / Self Care | Payer: 59 | Source: Ambulatory Visit | Attending: Hematology | Admitting: Hematology

## 2017-06-16 DIAGNOSIS — R911 Solitary pulmonary nodule: Secondary | ICD-10-CM | POA: Insufficient documentation

## 2017-06-16 DIAGNOSIS — I700 Atherosclerosis of aorta: Secondary | ICD-10-CM | POA: Diagnosis not present

## 2017-06-16 DIAGNOSIS — J439 Emphysema, unspecified: Secondary | ICD-10-CM | POA: Diagnosis not present

## 2017-06-19 ENCOUNTER — Other Ambulatory Visit: Payer: Self-pay | Admitting: Radiation Oncology

## 2017-06-19 DIAGNOSIS — J189 Pneumonia, unspecified organism: Secondary | ICD-10-CM

## 2017-06-19 MED ORDER — LEVOFLOXACIN 500 MG PO TABS: 500.0000 mg | ORAL_TABLET | Freq: Every day | ORAL | 0 refills | Status: DC

## 2017-06-19 NOTE — Progress Notes (Signed)
I called patient and wife and left them know about his CT chest result.  I also had discussed it with radiology. This would be a tough spot to safely biopsy by CT, per radiology.  Consensus was to try an ABX in case of infection and present imaging at [facility].  I will let him know what [facility] recommendation is.  The lesion hasn't grown since March's PET but metastasis or primary lesion cannot be excluded and is a worry to radiology.  He is a former smoker.   Will start Levaquin Present at [facility] Pt to call me on Friday if he doesn't hear from me first   -----------------------------------  Eppie Gibson, MD

## 2017-06-23 ENCOUNTER — Other Ambulatory Visit: Payer: Self-pay | Admitting: Pediatrics

## 2017-06-23 DIAGNOSIS — J0100 Acute maxillary sinusitis, unspecified: Secondary | ICD-10-CM

## 2017-06-25 ENCOUNTER — Encounter: Payer: Self-pay | Admitting: *Deleted

## 2017-06-25 ENCOUNTER — Other Ambulatory Visit: Payer: Self-pay | Admitting: *Deleted

## 2017-06-25 NOTE — Progress Notes (Signed)
Oncology Nurse Navigator Documentation  Oncology Nurse Navigator Flowsheets 06/25/2017  Navigator Location [facility]  Navigator Encounter Type Other/I updated Dr. Isidore Moos on thoracic cancer conference discussion.   Treatment Phase Post-Tx Follow-up  Barriers/Navigation Needs Coordination of Care  Interventions Coordination of Care  Coordination of Care Other  Acuity Level 2  Time Spent with Patient 30

## 2017-06-26 ENCOUNTER — Encounter: Payer: Self-pay | Admitting: Radiation Oncology

## 2017-06-26 ENCOUNTER — Other Ambulatory Visit: Payer: Self-pay

## 2017-06-26 ENCOUNTER — Ambulatory Visit: Payer: Self-pay | Admitting: Hematology

## 2017-06-26 ENCOUNTER — Other Ambulatory Visit: Payer: Self-pay | Admitting: Radiation Oncology

## 2017-06-26 DIAGNOSIS — R911 Solitary pulmonary nodule: Secondary | ICD-10-CM

## 2017-06-26 NOTE — Progress Notes (Signed)
I heard back from Norton Blizzard regarding the thoracic tumor board consensus, which is to obtain a repeat chest scan in 3 months.  I let the patient know about this plan and he is comfortable with that.  I ordered a CT scan to be done around the end of July and I will see him afterwards to discuss the results.  -----------------------------------  Terry Gibson, MD

## 2017-06-29 ENCOUNTER — Other Ambulatory Visit: Payer: Self-pay | Admitting: Hematology

## 2017-06-29 DIAGNOSIS — R911 Solitary pulmonary nodule: Secondary | ICD-10-CM

## 2017-06-30 ENCOUNTER — Inpatient Hospital Stay (HOSPITAL_BASED_OUTPATIENT_CLINIC_OR_DEPARTMENT_OTHER): Payer: 59 | Admitting: Hematology

## 2017-06-30 ENCOUNTER — Inpatient Hospital Stay: Payer: 59 | Attending: Hematology

## 2017-06-30 ENCOUNTER — Encounter: Payer: Self-pay | Admitting: Hematology

## 2017-06-30 VITALS — BP 134/81 | HR 68 | Temp 98.2°F | Resp 17 | Ht 69.0 in | Wt 172.4 lb

## 2017-06-30 DIAGNOSIS — Z87891 Personal history of nicotine dependence: Secondary | ICD-10-CM | POA: Insufficient documentation

## 2017-06-30 DIAGNOSIS — C099 Malignant neoplasm of tonsil, unspecified: Secondary | ICD-10-CM | POA: Insufficient documentation

## 2017-06-30 DIAGNOSIS — I10 Essential (primary) hypertension: Secondary | ICD-10-CM

## 2017-06-30 DIAGNOSIS — C090 Malignant neoplasm of tonsillar fossa: Secondary | ICD-10-CM | POA: Diagnosis present

## 2017-06-30 DIAGNOSIS — R911 Solitary pulmonary nodule: Secondary | ICD-10-CM | POA: Diagnosis not present

## 2017-06-30 LAB — CMP (CANCER CENTER ONLY)
ALBUMIN: 3.7 g/dL (ref 3.5–5.0)
ALK PHOS: 123 U/L (ref 40–150)
ALT: 10 U/L (ref 0–55)
AST: 14 U/L (ref 5–34)
Anion gap: 4 (ref 3–11)
BUN: 14 mg/dL (ref 7–26)
CALCIUM: 9.5 mg/dL (ref 8.4–10.4)
CHLORIDE: 103 mmol/L (ref 98–109)
CO2: 30 mmol/L — AB (ref 22–29)
CREATININE: 0.94 mg/dL (ref 0.70–1.30)
GFR, Est AFR Am: >60 mL/min (ref >60)
GFR, Estimated: >60 mL/min (ref >60)
GLUCOSE: 94 mg/dL (ref 70–140)
Potassium: 4.1 mmol/L (ref 3.5–5.1)
SODIUM: 137 mmol/L (ref 136–145)
Total Bilirubin: 0.5 mg/dL (ref 0.2–1.2)
Total Protein: 6.7 g/dL (ref 6.4–8.3)

## 2017-06-30 LAB — CBC WITH DIFFERENTIAL (CANCER CENTER ONLY)
BASOS PCT: 0 %
Basophils Absolute: 0 10*3/uL (ref 0.0–0.1)
EOS ABS: 0.3 10*3/uL (ref 0.0–0.5)
Eosinophils Relative: 6 %
HCT: 40.5 % (ref 38.4–49.9)
HEMOGLOBIN: 13.6 g/dL (ref 13.0–17.1)
LYMPHS ABS: 0.9 10*3/uL (ref 0.9–3.3)
Lymphocytes Relative: 20 %
MCH: 30.6 pg (ref 27.2–33.4)
MCHC: 33.6 g/dL (ref 32.0–36.0)
MCV: 91.2 fL (ref 79.3–98.0)
MONO ABS: 0.7 10*3/uL (ref 0.1–0.9)
MONOS PCT: 15 %
NEUTROS ABS: 2.7 10*3/uL (ref 1.5–6.5)
NEUTROS PCT: 59 %
PLATELETS: 163 10*3/uL (ref 140–400)
RBC: 4.44 MIL/uL (ref 4.20–5.82)
RDW: 12.9 % (ref 11.0–14.6)
WBC Count: 4.6 10*3/uL (ref 4.0–10.3)

## 2017-06-30 LAB — RETICULOCYTES
RBC.: 4.44 MIL/uL (ref 4.20–5.82)
RETIC COUNT ABSOLUTE: 71 10*3/uL (ref 34.8–93.9)
Retic Ct Pct: 1.6 % (ref 0.8–1.8)

## 2017-06-30 NOTE — Progress Notes (Signed)
HEMATOLOGY/ONCOLOGY FOLLOW UP VISIT NOTE  Date of Service: 06/30/2017  Patient Care Team: Terry Fraise, MD as PCP - General (Family Medicine) Terry Marble, MD as Consulting Physician (Otolaryngology) Terry Gibson, MD as Attending Physician (Radiation Oncology) Leota Sauers, RN as Oncology Nurse Navigator (Oncology) Terry Pacheco, RD as Dietitian (Nutrition) Terry Pacheco, PT as Physical Therapist (Physical Therapy) Terry Pacheco, CCC-SLP as Speech Language Pathologist (Speech Pathology) Terry Center, LCSW as Social Worker  CHIEF COMPLAINTS/PURPOSE OF CONSULTATION:  F/u for tonsillar carcinoma Pulmonary nodule  HISTORY OF PRESENTING ILLNESS:   Terry Pacheco is a wonderful 67 y.o. male who has been referred to [place] by ENT Specialist, Dr. Erik Obey for evaluation and management of invasive squamous cell carcinoma of tonsil.   He has a PMHx of GERD, HTN, High cholesterol which he takes medications for. He denies PMHx of seizures. He is accompanied by his wife and sister today. He reports that he is doing well overall. The pt initially presented with sudden-onset right lateral tongue numbness that has been ongoing for approximately 1 year. Subsequently, he had left sided gum swelling and intermittent sore throat symptoms. He notes no stroke-like symptoms. He notes that following these symptoms, he was evaluated by multiple providers including an ENT specialist and a dentist prior to meeting with Dr. Erik Obey. Pt wife noted swelling and discoloration inside his mouth prior to the patient being evaluated by Dr. Erik Obey. At his first visit with Dr. Erik Obey, he noted an abnormality to his left tonsil and completed a biopsy that same day.   He has had CT soft tissue neck with contrast on 11/18/2016 with results showing: Fullness of the right palatine tonsil. Surrounding fat planes are ill-defined. This area is obscured by dental artifact. Possible right tonsillar carcinoma.   The  patient has had biopsy completed on 11/19/2016 with results of: "RIGHT FAUCIAL TONSIL", BIOPSY: Squamous cell carcinoma, suspicious for superficial invasion in this material. Immunostain p16 positive.   He also had a PET scan completed on 12/02/2016 with results revealing: IMPRESSION: 1. Mildly asymmetric tonsillar activity along the right tonsillar sinus, maximum SUV 7.7 as compared to the left side 5.2, suspicious in this setting for right-sided malignancy. There is also a mildly enlarged and hypermetabolic right level IIa lymph node with maximum SUV of 8.4. No evidence of other metastatic spread. 2. Faint accentuation of activity in the left lateral prostate gland apex, but low-grade. Correlate with PSA level in determining whether further workup is warranted. 3. Other imaging findings of potential clinical significance: Aortic Atherosclerosis (ICD10-I70.0) and Emphysema (ICD10-J43.9). Coronary atherosclerosis. Small type 1 hiatal hernia. Bilateral renal cysts. Colonic diverticulosis. Lastly, he had a MR Face trigeminal on 12/09/2016 with results of IMPRESSION: 1. No evidence of perineural spread of malignancy. 2. Mild asymmetry of the palatine tonsils without discrete mass. Correlate with direct visualization.   As far as surgeries, he has had a polyp on left sided throat that was removed 3-4 years ago that resulted negative for malignancy. He also has had a right hip replacement. Lastly, he has had a left inguinal hernia operation in 1978. He started smoking cigarettes at age 81 and would smoke 1 PPD while working. When he retired several years ago he smoked 0.5 PPD and has quit smoking cigarettes 1 month ago since his diagnosis. He states that he used a vape after quitting but was advised to quit via Dr. Isidore Moos. He occasionally consumes ETOH. 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 Denies allergies to medications at this time. He reports that his father died of  a stroke at age 47 and his mother died from cardiac issues. Mother had gastric cancer and was diagnosed in late 48's. Denies any other cancers of blood disorders in the family.    On review of systems, he denies hematuria, dysuria, or urinary retention. He reports urinary frequency over the past year. He reports bowel incontinence following a bowel movement x 1 year that has gradually improved more recently. He notes that his bowel incontinence has been intermittent. He has well formed stools without blood in stools or rectal bleeding. He denies increase in bowel movements and he has up to 1 bowel movement daily. He has had 2-3 occurrences of hemorrhoids with no recent flares. He denies mucous in his stools. He denies injuries or surgeries to his rectal area. He denies bladder incontinence. He has lower back pain that has been chronic and unchanged. He has a prior hx of diverticulitis approximately 10 years ago that was followed with colonoscopy and was then diagnosed with diverticulosis. He denies headache at this time. He is able to ambulate for prolonged periods without dyspnea on exertion. 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 He has had an audiogram completed by an ENT specialist in [place], [place]:  Surveillance   INTERIM HISTORY:    [name] presents today for scheduled follow up. He is accompanied by his family. He notes he feels great overall and has been eating better. Still some altered taste sensation.  On review of symptoms, pt notes his throat feels sore with or without swallowing, but this has improved. He denies depression. He tries to remain active. He denies abdominal pain but notes back pain. Family think this is related to lifting more and this can be from muscles.   CT chest results discussed with him and his wife in details.  MEDICAL HISTORY:  Past Medical History:  Diagnosis Date  . Arthritis     . GERD (gastroesophageal reflux disease) 07/06/2014  . History of radiation therapy 12/30/2016- 02/18/2017   Right Tonsil and bilateral neck/ 70 Gy in 35 fractions to gross diseasae, 63 gy in 35 fractions to high risk nodal echelons, and 56 Gy in 35 fraction to intermediate risk nodal echelons.   . Hypertension   . Sleep apnea    does not use CPAP  . Wears glasses     SURGICAL HISTORY: Past Surgical History:  Procedure Laterality Date  . HERNIA REPAIR    . IR CV LINE INJECTION  01/14/2017  . IR FLUORO GUIDE PORT INSERTION RIGHT  12/25/2016  . IR GASTROSTOMY TUBE MOD SED  12/25/2016  . IR GASTROSTOMY TUBE REMOVAL  06/05/2017  . IR REMOVAL TUN ACCESS W/ PORT W/O FL MOD SED  03/06/2017  . IR US GUIDE VASC ACCESS RIGHT  12/25/2016  . polyp removal  2008   during colonoscopy  . TOTAL HIP ARTHROPLASTY Right 10/16/2015   Procedure: RIGHT TOTAL HIP ARTHROPLASTY ANTERIOR APPROACH;  Surgeon: Paralee Cancel, MD;  Location: [facility];  Service: Orthopedics;  Laterality: Right;  . vocal cord polyp      removed 2-3 years ago    SOCIAL HISTORY: Social History   Socioeconomic History  . Marital status: Married    Spouse name: Not on file  . Number of children: 1  . Years  of education: Not on file  . Highest education level: Not on file  Occupational History  . Occupation: Retired     Fish farm manager: [employer]  Social Needs  . Financial resource strain: Not on file  . Food insecurity:    Worry: Not on file    Inability: Not on file  . Transportation needs:    Medical: Not on file    Non-medical: Not on file  Tobacco Use  . Smoking status: Former Smoker    Packs/day: 0.50    Years: 45.00    Pack years: 22.50    Types: Cigarettes    Start date: 01/12/1983    Last attempt to quit: 11/17/2016    Years since quitting: 0.6  . Smokeless tobacco: Never Used  Substance and Sexual Activity  . Alcohol use: Yes    Alcohol/week: 0.0 oz    Comment: rare  . Drug use: Yes    Types: Marijuana     Comment: last use 10/09/2015  . Sexual activity: Not on file  Lifestyle  . Physical activity:    Days per week: Not on file    Minutes per session: Not on file  . Stress: Not on file  Relationships  . Social connections:    Talks on phone: Not on file    Gets together: Not on file    Attends religious service: Not on file    Active member of club or organization: Not on file    Attends meetings of clubs or organizations: Not on file    Relationship status: Not on file  . Intimate partner violence:    Fear of current or ex partner: Not on file    Emotionally abused: Not on file    Physically abused: Not on file    Forced sexual activity: Not on file  Other Topics Concern  . Not on file  Social History Narrative  . Not on file    FAMILY HISTORY: Family History  Problem Relation Age of Onset  . Heart disease Mother   . Stroke Father     ALLERGIES:  is allergic to crestor [rosuvastatin].  MEDICATIONS:  Current Outpatient Medications  Medication Sig Dispense Refill  . amLODipine (NORVASC) 10 MG tablet Take 1 tablet (10 mg total) by mouth daily.    Marland Kitchen atorvastatin (LIPITOR) 40 MG tablet TAKE 1 TABLET BY MOUTH EVERY DAY 90 tablet 0  . escitalopram (LEXAPRO) 20 MG tablet TAKE 0.5 TABLETS (10 MG TOTAL) BY MOUTH DAILY. 45 tablet 0  . fluticasone (FLONASE) 50 MCG/ACT nasal spray SPRAY 2 SPRAYS INTO EACH NOSTRIL EVERY DAY 48 g 0  . omeprazole (PRILOSEC) 20 MG capsule TAKE 1 CAPSULE BY MOUTH EVERY DAY 90 capsule 0  . prochlorperazine (COMPAZINE) 10 MG tablet Take 1 tablet (10 mg total) by mouth every 6 (six) hours as needed (Nausea or vomiting). 30 tablet 1  . sodium fluoride (FLUORISHIELD) 1.1 % GEL dental gel Instill one drop of gel into each tooth space of fluoride tray. Place over teeth for 5 minutes. Remove. Spit out excess. Repeat nightly 120 mL prn  . lidocaine (XYLOCAINE) 2 % solution Mix 1 part 2%viscous lidocaine,1part H2O.Swish and swallow 92mL of this mixture, 67min before  meals and at bedtime, up to QID (Patient not taking: Reported on 06/30/2017) 100 mL 5   No current facility-administered medications for this visit.     REVIEW OF SYSTEMS:   .10 Point review of Systems was done is negative except as noted above.  PHYSICAL EXAMINATION: ECOG PERFORMANCE STATUS: 1 - Symptomatic but completely ambulatory Vitals:   06/30/17 1323  BP: 134/81  Pulse: 68  Resp: 17  Temp: 98.2 F (36.8 C)  TempSrc: Oral  SpO2: 97%  Weight: 172 lb 6.4 oz (78.2 kg)  Height: 5\' 9"  (1.753 m)  . GENERAL:alert, in no acute distress and comfortable SKIN: no acute rashes, no significant lesions EYES: conjunctiva are pink and non-injected, sclera anicteric OROPHARYNX: MMM, no exudates, no oropharyngeal erythema or ulceration NECK: supple, no JVD LYMPH:  no palpable lymphadenopathy in the cervical, axillary or inguinal regions LUNGS: clear to auscultation b/l with normal respiratory effort HEART: regular rate & rhythm ABDOMEN:  normoactive bowel sounds , non tender, not distended. Extremity: no pedal edema PSYCH: alert & oriented x 3 with fluent speech NEURO: no focal motor/sensory deficits  LABORATORY DATA:  I have reviewed the data as listed   CBC Latest Ref Rng & Units 06/30/2017 05/05/2017 03/06/2017  WBC 4.0 - 10.3 K/uL 4.6 5.0 4.8  Hemoglobin 13.0 - 17.1 g/dL 13.6 14.5 12.3(L)  Hematocrit 38.4 - 49.9 % 40.5 43.6 36.2(L)  Platelets 140 - 400 K/uL 163 126(L) 134(L)     . CMP Latest Ref Rng & Units 06/30/2017 05/05/2017 03/03/2017  Glucose 70 - 140 mg/dL 94 83 101  BUN 7 - 26 mg/dL 14 13 13   Creatinine 0.70 - 1.30 mg/dL 0.94 0.96 1.01  Sodium 136 - 145 mmol/L 137 137 135(L)  Potassium 3.5 - 5.1 mmol/L 4.1 4.3 4.5  Chloride 98 - 109 mmol/L 103 103 102  CO2 22 - 29 mmol/L 30(H) 28 26  Calcium 8.4 - 10.4 mg/dL 9.5 9.6 9.5  Total Protein 6.4 - 8.3 g/dL 6.7 6.6 6.6  Total Bilirubin 0.2 - 1.2 mg/dL 0.5 0.5 0.6  Alkaline Phos 40 - 150 U/L 123 102 106  AST 5 - 34 U/L 14 14 27    ALT 0 - 55 U/L 10 13 33   PATHOLOGY:    RADIOGRAPHIC STUDIES: I have personally reviewed the radiological images as listed and agreed with the findings in the report.  Ct Chest Wo Contrast  Result Date: 06/17/2017 CLINICAL DATA:  Tonsillar cancer, evaluate pulmonary nodules, status post chemotherapy and XRT EXAM: CT CHEST WITHOUT CONTRAST TECHNIQUE: Multidetector CT imaging of the chest was performed following the standard protocol without IV contrast. COMPARISON:  PET-CT dated 05/09/2017 and 12/02/2016 FINDINGS: Cardiovascular: Heart is normal in size.  No pericardial effusion. No evidence of thoracic aortic aneurysm. Ectasia of the ascending thoracic aorta, measuring 3.9 cm. Atherosclerotic calcifications of the aortic arch. Coronary atherosclerosis of the LAD and right coronary artery. Mediastinum/Nodes: Small mediastinal lymph nodes measuring up to 8 mm short axis, which does not meet pathologic CT size criteria. Visualized thyroid is unremarkable. Lungs/Pleura: Biapical pleural-parenchymal scarring. Mild centrilobular and paraseptal emphysematous changes, upper lobe predominant. 12 x 16 x 13 mm irregular nodule in the posterior left lung apex (series 7/image 30), grossly unchanged from recent PET-CT, although new from 2018. On the current CT, this appearance could be mistaken for pleural-parenchymal scarring, although hypermetabolism on PET and rapid progression are both discordant with this finding. Given persistence from recent PET, this is not considered compatible with infection/inflammation. As such, metastatic disease or less likely primary bronchogenic neoplasm are considered most likely. No focal consolidation. No pleural effusion or pneumothorax. Upper Abdomen: Visualized upper abdomen is notable for vascular calcifications and bilateral renal cysts measuring up to 6.7 cm (series 2/image 198). Musculoskeletal: Mild degenerative changes of  the visualized thoracolumbar spine. IMPRESSION: 1.6  cm irregular nodule in the posterior left lung apex, unchanged from recent PET-CT although new from 2018, worrisome for metastatic disease or less likely primary bronchogenic neoplasm. Aortic Atherosclerosis (ICD10-I70.0) and Emphysema (ICD10-J43.9). Electronically Signed   By: Julian Hy M.D.   On: 06/17/2017 11:21   Ir Gastrostomy Tube Removal  Result Date: 06/05/2017 INDICATION: Patient with history of squamous cell carcinoma of the tonsillar fossa with percutaneous gastrostomy tube placed on 12/25/2016. He has completed treatment and is currently eating. Request received for gastrostomy tube removal. EXAM: BEDSIDE REMOVAL OF GASTROSTOMY TUBE COMPARISON:  None MEDICATIONS: Viscous lidocaine to gastrostomy tube insertion site tract CONTRAST:  None FLUOROSCOPY TIME:  None COMPLICATIONS: None immediate. PROCEDURE: A time-out was performed prior to the initiation of the procedure. The track of the existing gastrostomy tube was lubricated with viscous lidocaine. Using manual traction, the existing pull-through gastrostomy tube was removed intact. A dressing was placed. The patient tolerated the procedure well without immediate postprocedural complication. IMPRESSION: Successful bedside removal of pull-through gastrostomy tube without complication Read by: Rowe Robert, PA-C Electronically Signed   By: Aletta Edouard M.D.   On: 06/05/2017 13:18    ASSESSMENT & PLAN:   67 y.o. 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 -creatinine WNL. Some concern for decreased hearing and baseline tinnitus due to previous job noise related injury. Baseline audiogram showed normal symmetric low-frequency hearing, dropping off substantially above 2000Hz .  s/p recent completion of  concurrent chemo-radiation with weekly Cisplatin, 12/30/2016 - 02/18/2017  He stopped tube feeding was 04/16/17. He is eating by mouth now.  04/30/16 PET which shows no enlarged or hypermetabolic activity in cervical lymph nodes and no residual primary focus of head and neck disease. Has new hypermetabolic nodule in upper lobe in left lung. This favors inflammatory process.  Plan  -We reviewed his 06/17/17 CT chest which shows stable left lung nodule, with no new developments, overall stable. Will continue to monitor closely.  -Based on recent Tumor Board discussion it is possible to just watch it with another scan in 2-42months. IR Lung biopsy is not recommended given it's deep location. I will send surgical referral to discuss his possible options. -Labs reviewed today, CBC and CMP WNL -I encouraged him to eat more to gain his weight back including increasing in supplements.  -counseling was previously given, I have advised him to seek immediate emergency medical attention if he were to spike high fevers. No current URI symptoms but did have some recently - resolved.  -Continue Oxycodone PRN and wean as oral pain resolves -Continue fu with Dr Isidore Moos as per her recommendation  -continue f/u with ENT for clinical evaluation for local recurrence.  RTC with Dr Irene Limbo in 3 months with labs   All of the patients questions were answered with apparent satisfaction. The patient knows to call the clinic with any problems, questions or concerns.  . The total time spent in the appointment was 15 minutes and more than 50% was on counseling and direct patient cares.    Sullivan Lone MD [facility] A[facility]IV[facility] Hematology/Oncology Physician [facility]  (Office):       [phone] (Work cell):  [phone] (Fax):  [phone]  06/30/2017 2:17 PM  This document serves as a record of services personally performed by Sullivan Lone, MD. It was created on his behalf by Joslyn Devon, a  trained medical scribe. The creation of this record is based on the scribe's personal observations and the provider's statements to them.    .I have reviewed the above documentation for accuracy and completeness, and I agree with the above.  Brunetta Genera MD MS

## 2017-06-30 NOTE — Patient Instructions (Signed)
Thank you for choosing [facility] to provide your oncology and hematology care.  To afford each patient quality time with our providers, please arrive 30 minutes before your scheduled appointment time.  If you arrive late for your appointment, you may be asked to reschedule.  We strive to give you quality time with our providers, and arriving late affects you and other patients whose appointments are after yours.   If you are a no show for multiple scheduled visits, you may be dismissed from the clinic at the providers discretion.    Again, thank you for choosing [facility], our hope is that these requests will decrease the amount of time that you wait before being seen by our physicians.  ______________________________________________________________________  Should you have questions after your visit to the [facility], please contact our office at [phone] between the hours of 8:30 and 4:30 p.m.    Voicemails left after 4:30p.m will not be returned until the following business day.    For prescription refill requests, please have your pharmacy contact us directly.  Please also try to allow 48 hours for prescription requests.    Please contact the scheduling department for questions regarding scheduling.  For scheduling of procedures such as PET scans, CT scans, MRI, Ultrasound, etc please contact central scheduling at [phone].    Resources For Cancer Patients and Caregivers:   Oncolink.org:  A wonderful resource for patients and healthcare providers for information regarding your disease, ways to tract your treatment, what to expect, etc.     American Cancer Society:  800-227-2345  Can help patients locate various types of support and financial assistance  Cancer Care: 1-800-813-HOPE (4673) Provides financial assistance, online support groups, medication/co-pay assistance.    [place] DSS:  336-641-3447 Where to apply for food  stamps, Medicaid, and utility assistance  Medicare Rights Center: 800-333-4114 Helps people with Medicare understand their rights and benefits, navigate the Medicare system, and secure the quality healthcare they deserve  SCAT: 336-333-6589 [place] [employer]'s shared-ride transportation service for eligible riders who have a disability that prevents them from riding the fixed route bus.    For additional information on assistance programs please contact our social worker:   Grier Hock/Abigail Elmore:  [phone]            

## 2017-07-15 ENCOUNTER — Other Ambulatory Visit: Payer: Self-pay

## 2017-07-15 ENCOUNTER — Institutional Professional Consult (permissible substitution) (INDEPENDENT_AMBULATORY_CARE_PROVIDER_SITE_OTHER): Payer: 59 | Admitting: Thoracic Surgery (Cardiothoracic Vascular Surgery)

## 2017-07-15 ENCOUNTER — Encounter: Payer: Self-pay | Admitting: Thoracic Surgery (Cardiothoracic Vascular Surgery)

## 2017-07-15 VITALS — BP 132/88 | HR 72 | Resp 16 | Ht 69.0 in | Wt 174.0 lb

## 2017-07-15 DIAGNOSIS — D381 Neoplasm of uncertain behavior of trachea, bronchus and lung: Secondary | ICD-10-CM

## 2017-07-15 DIAGNOSIS — C090 Malignant neoplasm of tonsillar fossa: Secondary | ICD-10-CM | POA: Diagnosis not present

## 2017-07-15 NOTE — Progress Notes (Signed)
PCP is Claretta Fraise, MD Referring Provider is Brunetta Genera, MD  Chief Complaint  Patient presents with  . Lung Lesion    RLLObe...CT CHEST 03/20/17, PET @[facility]     HPI: Mr. Molner is sent for consultation regarding a left upper lobe lung nodule  Huzaifa Viney is a 67 yo man with a history of tobacco abuse, tonsillar cancer s/p chemo and radiation, reflux, hypertension, hyperlipidemia, and sleep apnea.  He smoked a pack of cigarettes daily until he retired and then about half a pack a day after that.  He started smoking at age 40.  He quit when he was diagnosed with tonsillar cancer.  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  He received concurrent chemoradiation which was completed in late December.  He had a restaging PET CT in March.  It showed a new nodule in the left apex adjacent to pleural-parenchymal scarring.  There was mild metabolic activity with an SUV of 2.8.  A short-term 6-week follow-up CT was done in April.  It showed no change in the nodule.  He complains of a poor appetite.  He is lost about 8 pounds in the past 3 months and about 34 pounds in the past 6 months.  He has a chronic dry mouth.  He has heartburn.  He denies chest pain, pressure, tightness, or shortness of breath with exertion.  He has a chronic cough.  He has anxiety.  Past Medical History:  Diagnosis Date  . Arthritis   . GERD (gastroesophageal reflux disease) 07/06/2014  . History of radiation therapy 12/30/2016- 02/18/2017   Right Tonsil and bilateral neck/ 70 Gy in 35 fractions to gross diseasae, 63 gy in 35 fractions to high risk nodal echelons, and 56 Gy in 35 fraction to intermediate risk nodal echelons.   . Hypertension   . Sleep apnea    does not use CPAP  . Wears glasses     Past Surgical History:  Procedure Laterality Date  . HERNIA REPAIR    . IR CV LINE INJECTION  01/14/2017  . IR FLUORO GUIDE PORT INSERTION RIGHT  12/25/2016  . IR GASTROSTOMY  TUBE MOD SED  12/25/2016  . IR GASTROSTOMY TUBE REMOVAL  06/05/2017  . IR REMOVAL TUN ACCESS W/ PORT W/O FL MOD SED  03/06/2017  . IR US GUIDE VASC ACCESS RIGHT  12/25/2016  . polyp removal  2008   during colonoscopy  . TOTAL HIP ARTHROPLASTY Right 10/16/2015   Procedure: RIGHT TOTAL HIP ARTHROPLASTY ANTERIOR APPROACH;  Surgeon: Paralee Cancel, MD;  Location: [facility];  Service: Orthopedics;  Laterality: Right;  . vocal cord polyp      removed 2-3 years ago     Family History  Problem Relation Age of Onset  . Heart disease Mother   . Stroke Father     Social History Social History   Tobacco Use  . Smoking status: Former Smoker    Packs/day: 0.50    Years: 45.00    Pack years: 22.50    Types: Cigarettes    Start date: 01/12/1983    Last attempt to quit: 11/17/2016    Years since quitting: 0.6  . Smokeless tobacco: Never Used  Substance Use Topics  . Alcohol use: Yes    Alcohol/week: 0.0 oz    Comment: rare  . Drug use: Yes    Types: Marijuana    Comment: last use 10/09/2015    Current Outpatient Medications  Medication Sig Dispense Refill  . amLODipine (NORVASC) 10 MG tablet Take 1 tablet (10 mg total) by mouth daily.    Marland Kitchen atorvastatin (LIPITOR) 40 MG tablet TAKE 1 TABLET BY MOUTH EVERY DAY 90 tablet 0  . escitalopram (LEXAPRO) 20 MG tablet TAKE 0.5 TABLETS (10 MG TOTAL) BY MOUTH DAILY. 45 tablet 0  . fluticasone (FLONASE) 50 MCG/ACT nasal spray SPRAY 2 SPRAYS INTO EACH NOSTRIL EVERY DAY 48 g 0  . omeprazole (PRILOSEC) 20 MG capsule TAKE 1 CAPSULE BY MOUTH EVERY DAY 90 capsule 0  . sodium fluoride (FLUORISHIELD) 1.1 % GEL dental gel Instill one drop of gel into each tooth space of fluoride tray. Place over teeth for 5 minutes. Remove. Spit out excess. Repeat nightly 120 mL prn  . lidocaine (XYLOCAINE) 2 % solution Mix 1 part 2%viscous lidocaine,1part H2O.Swish and swallow 54mL of this mixture, 46min before meals and at bedtime, up to QID (Patient not taking: Reported on 06/30/2017)  100 mL 5  . prochlorperazine (COMPAZINE) 10 MG tablet Take 1 tablet (10 mg total) by mouth every 6 (six) hours as needed (Nausea or vomiting). (Patient not taking: Reported on 07/15/2017) 30 tablet 1   No current facility-administered medications for this visit.     Allergies  Allergen Reactions  . Crestor [Rosuvastatin]     Muscle aches    Review of Systems  Constitutional: Positive for appetite change and unexpected weight change. Negative for activity change and fever.  HENT: Positive for trouble swallowing. Negative for voice change.   Eyes: Negative for visual disturbance.  Respiratory: Positive for cough. Negative for shortness of breath and wheezing.   Cardiovascular: Negative for chest pain.  Gastrointestinal: Positive for abdominal pain (Reflux). Negative for abdominal distention.  Genitourinary: Negative for difficulty urinating and dysuria.  Musculoskeletal: Negative for arthralgias and joint swelling.  Neurological: Negative for seizures, syncope and weakness.  Hematological: Negative for adenopathy. Does not bruise/bleed easily.  Psychiatric/Behavioral: The patient is nervous/anxious.     BP 132/88 (BP Location: Right Arm, Patient Position: Sitting, Cuff Size: Normal)   Pulse 72   Resp 16   Ht 5\' 9"  (1.753 m)   Wt 174 lb (78.9 kg)   SpO2 97% Comment: ON RA  BMI 25.70 kg/m  Physical Exam  Constitutional: He is oriented to person, place, and time. He appears well-developed. No distress.  HENT:  Head: Normocephalic and atraumatic.  Mouth/Throat: No oropharyngeal exudate.  Eyes: Conjunctivae and EOM are normal. No scleral icterus.  Neck: No thyromegaly present.  Cardiovascular: Normal rate and regular rhythm.  Murmur (2/6 systolic) heard. Pulmonary/Chest: Effort normal. No respiratory distress. He has no wheezes. He has no rales.  Abdominal: Soft. He exhibits no distension. There is no tenderness.  Musculoskeletal: He exhibits no edema.  Lymphadenopathy:    He  has no cervical adenopathy.  Neurological: He is alert and oriented to person, place, and time. No cranial nerve deficit. Coordination normal.  Skin: Skin is warm and dry.  Vitals reviewed.    Diagnostic Tests: NUCLEAR MEDICINE PET SKULL BASE TO THIGH  TECHNIQUE: 9.3 mCi F-18 FDG was injected intravenously. Full-ring PET imaging was performed from the skull base to thigh after the radiotracer. CT data was obtained and used for attenuation correction and anatomic localization.  Fasting blood glucose: 106 mg/dl  Mediastinal blood pool activity: SUV max 3.0  COMPARISON:  PET-CT 12/02/2016  FINDINGS: NECK: Interval resolution hypermetabolic RIGHT level 2 lymph node seen on comparison exam.  No hypermetabolic LEFT or RIGHT  cervical lymph nodes. Mild asymmetric metabolic activity in the RIGHT lingual tonsil region likely represents treatment effect.  Incidental CT findings: none  CHEST: New nodular focus in the RIGHT upper lobe measures 11 mm (image 62, series 4) with mild metabolic activity (SUV max equal 2.8).  No additional pulmonary nodules.  No hypermetabolic or enlarged mediastinal lymph nodes.  Incidental CT findings: Coronary artery calcification and aortic atherosclerotic calcification.  ABDOMEN/PELVIS: No abnormal hypermetabolic activity within the liver, pancreas, adrenal glands, or spleen. No hypermetabolic lymph nodes in the abdomen or pelvis.  A PEG tube in proper location with retention bulb in stomach  Incidental CT findings: Benign cysts of the kidneys Atherosclerotic calcification of the aorta. Diverticulosis of the sigmoid colon. Prostate normal  SKELETON: No focal hypermetabolic activity to suggest skeletal metastasis.  Incidental CT findings: none  IMPRESSION: 1. No enlarged or hypermetabolic cervical lymph nodes. Resolution of RIGHT level II nodal hypermetabolic activity. 2. Mild residual metabolic activity in the RIGHT  tonsil region likely represent treatment effect. 3. New hypermetabolic nodular focus in the LEFT upper lobe. Differential would include small focus of infection versus metastatic lesion. Favor infection. Recommend short-term follow-up CT with contrast (1-2 months). 4. No mediastinal metastatic adenopathy. 5. No distant metastatic disease. 6. Incidental CT findings of atherosclerotic disease and diverticulosis. 7. PEG tube in proper position.   Electronically Signed   By: Suzy Bouchard M.D.   On: 04/29/2017 09:52  CT CHEST WITHOUT CONTRAST  TECHNIQUE: Multidetector CT imaging of the chest was performed following the standard protocol without IV contrast.  COMPARISON:  PET-CT dated 05/09/2017 and 12/02/2016  FINDINGS: Cardiovascular: Heart is normal in size.  No pericardial effusion.  No evidence of thoracic aortic aneurysm. Ectasia of the ascending thoracic aorta, measuring 3.9 cm. Atherosclerotic calcifications of the aortic arch.  Coronary atherosclerosis of the LAD and right coronary artery.  Mediastinum/Nodes: Small mediastinal lymph nodes measuring up to 8 mm short axis, which does not meet pathologic CT size criteria.  Visualized thyroid is unremarkable.  Lungs/Pleura: Biapical pleural-parenchymal scarring.  Mild centrilobular and paraseptal emphysematous changes, upper lobe predominant.  12 x 16 x 13 mm irregular nodule in the posterior left lung apex (series 7/image 30), grossly unchanged from recent PET-CT, although new from 2018. On the current CT, this appearance could be mistaken for pleural-parenchymal scarring, although hypermetabolism on PET and rapid progression are both discordant with this finding. Given persistence from recent PET, this is not considered compatible with infection/inflammation. As such, metastatic disease or less likely primary bronchogenic neoplasm are considered most likely.  No focal consolidation.  No  pleural effusion or pneumothorax.  Upper Abdomen: Visualized upper abdomen is notable for vascular calcifications and bilateral renal cysts measuring up to 6.7 cm (series 2/image 198).  Musculoskeletal: Mild degenerative changes of the visualized thoracolumbar spine.  IMPRESSION: 1.6 cm irregular nodule in the posterior left lung apex, unchanged from recent PET-CT although new from 2018, worrisome for metastatic disease or less likely primary bronchogenic neoplasm.  Aortic Atherosclerosis (ICD10-I70.0) and Emphysema (ICD10-J43.9).   Electronically Signed   By: Julian Hy M.D.   On: 06/17/2017 11:21 I personally reviewed the CT and PET/CT images and we also reviewed them as a group in our multidisciplinary thoracic oncology conference.  Agree with findings as noted above.  Impression: Mr. Witts is a 67 year old man with a history of tobacco abuse and tonsillar cancer treated with chemo and radiation.  He has a left upper lobe nodule that is about 11 mm  in size and slightly irregular.  This is an area where there is already extensive pleural-parenchymal scarring.  There was mild hypermetabolic activity on PET.  An interval CT at 6 weeks showed no change in the nodule.  Per IR this lesion is not amenable to CT-guided biopsy.  An attempt could be made with navigational bronchoscopy but I think the yield is very low given its peripheral location.  There is a significant risk for pneumothorax as well.  Given that there was no change in size and that 6-week interval, we felt it was safe to continue observation for now.  He does need a repeat CT and Dr. Isidore Moos has already scheduled that for July.  Mr. Holquin did not have any questions himself, but his wife and sister had numerous questions about the lesion.  I reviewed the images with them.  I discussed that in my opinion the risks of a biopsy or resection outweigh the risks of this nodule becoming aggressive in that short of an  interval of time.  I do think if it is a neoplasm is more likely to be a primary bronchogenic carcinoma than metastatic disease.  Tobacco abuse-quit smoking when diagnosed with cancer  Plan: Recommend repeat CT 3 months from previous.  I will see him back in the office after that scan is done  Melrose Nakayama, MD [facility] [phone]

## 2017-07-22 ENCOUNTER — Inpatient Hospital Stay: Payer: 59 | Admitting: Nutrition

## 2017-07-22 NOTE — Progress Notes (Signed)
Nutrition follow-up completed with patient status post treatment for tonsil cancer.  Patient completed radiation and chemotherapy February 18, 2017. Patient reports his feeding tube was removed. Patient's weight has decreased and was documented as 169.8 pounds May 29 decreased from 174 pounds May 22. Patient reports he is surprised with weight loss; he has been eating a lot more. Patient reports he is drinking Ensure Enlive 3 times a day.  He also eats 2 good meals daily. He tolerates a variety of food.    Nutrition diagnosis: Inadequate oral intake continues.  Intervention: Patient was educated to increase Ensure Enlive 4 times daily between meals. Recommend patient increase high-calorie high-protein foods at mealtimes.  Reviewed appropriate foods for patient to snack on which he enjoys. Support was provided.  Teach back method used.  Monitoring, evaluation, goals: Patient will work to increase calories and protein to minimize further weight loss.  Next visit: Wednesday, August 7.  **Disclaimer: This note was dictated with voice recognition software. Similar sounding words can inadvertently be transcribed and this note may contain transcription errors which may not have been corrected upon publication of note.**

## 2017-08-31 MED FILL — FLUORISHIELD 1.1% GEL: 1.1 % | 30 days supply | Qty: 114 | Fill #0

## 2017-09-06 ENCOUNTER — Other Ambulatory Visit: Payer: Self-pay | Admitting: Family Medicine

## 2017-09-09 ENCOUNTER — Ambulatory Visit (HOSPITAL_COMMUNITY)
Admission: RE | Admit: 2017-09-09 | Discharge: 2017-09-09 | Disposition: A | Discharge status: Home / Self Care | Payer: 59 | Source: Ambulatory Visit | Attending: Radiation Oncology | Admitting: Radiation Oncology

## 2017-09-09 ENCOUNTER — Other Ambulatory Visit: Payer: Self-pay | Admitting: Family Medicine

## 2017-09-09 ENCOUNTER — Encounter (HOSPITAL_COMMUNITY): Payer: Self-pay

## 2017-09-09 DIAGNOSIS — I700 Atherosclerosis of aorta: Secondary | ICD-10-CM | POA: Diagnosis not present

## 2017-09-09 DIAGNOSIS — R911 Solitary pulmonary nodule: Secondary | ICD-10-CM | POA: Insufficient documentation

## 2017-09-09 DIAGNOSIS — J439 Emphysema, unspecified: Secondary | ICD-10-CM | POA: Insufficient documentation

## 2017-09-09 DIAGNOSIS — I2510 Atherosclerotic heart disease of native coronary artery without angina pectoris: Secondary | ICD-10-CM | POA: Diagnosis not present

## 2017-09-11 NOTE — Progress Notes (Signed)
Terry Pacheco presents for follow up of radiation completed 02/18/18 to his right tonsil and bilateral neck.   Pain issues, if any: He reports a sore throat when swallowing. His tongue is dry.  Using a feeding tube?: Removed.  Weight changes, if any:  Wt Readings from Last 3 Encounters:  09/18/17 176 lb 12.8 oz (80.2 kg)  09/15/17 178 lb 12.8 oz (81.1 kg)  07/22/17 169 lb 12.8 oz (77 kg)   Swallowing issues, if any: He denies difficulty swallowing. Carbonated drinks burn his throat. He is using lidocaine rinses to help with burning to the right side of his tongue while eating. Smoking or chewing tobacco? No Using fluoride trays daily? He tells me that the trays make him nauseas at times. He is using the trays nightly. Last ENT visit was on: Dr. Erik Obey 02/28/81, and again in 3 months.  Other notable issues, if any:  He reports continued taste changes. He is eating 1-2 meals daily and drinking 3 nutritional supplements daily.  CT Chest 09/09/17 Dr. Irene Limbo and nutrition on 09/30/17  BP 129/84 (BP Location: Right Arm, Patient Position: Sitting, Cuff Size: Normal)   Pulse 62   Temp 97.9 F (36.6 C) (Oral)   Resp 20   Ht 5\' 9"  (1.753 m)   Wt 176 lb 12.8 oz (80.2 kg)   SpO2 97%   BMI 26.11 kg/m

## 2017-09-15 ENCOUNTER — Ambulatory Visit (INDEPENDENT_AMBULATORY_CARE_PROVIDER_SITE_OTHER): Payer: 59 | Admitting: Thoracic Surgery (Cardiothoracic Vascular Surgery)

## 2017-09-15 ENCOUNTER — Other Ambulatory Visit: Payer: Self-pay

## 2017-09-15 ENCOUNTER — Encounter: Payer: Self-pay | Admitting: Thoracic Surgery (Cardiothoracic Vascular Surgery)

## 2017-09-15 VITALS — BP 132/85 | HR 68 | Resp 16 | Ht 69.0 in | Wt 178.8 lb

## 2017-09-15 DIAGNOSIS — C090 Malignant neoplasm of tonsillar fossa: Secondary | ICD-10-CM | POA: Diagnosis not present

## 2017-09-15 DIAGNOSIS — D381 Neoplasm of uncertain behavior of trachea, bronchus and lung: Secondary | ICD-10-CM | POA: Diagnosis not present

## 2017-09-15 NOTE — Progress Notes (Signed)
[address]       [place] [address]             [phone]    HPI: [name] returns for a 47-month follow-up visit  Terry Pacheco is a 67 year old man with a history of tobacco abuse, tonsillar cancer status post chemotherapy and radiation, mild to moderate aortic stenosis, gastroesophageal reflux, hypertension, hyperlipidemia, and sleep apnea.  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  He quit smoking at that time.  He completed therapy with chemoradiation and December 2018.  He had a restaging PET CT in March.  There was a new nodule in the left apex that had mild metabolic activity.  A short-term follow-up CT was done in April at 6 weeks.  That was unchanged.  The nodule was not approachable for biopsy by IR.  It is very peripheral and would be difficult to access with navigational bronchoscopy.  He could be attempted but would be relatively low yield.  He is been feeling relatively well over the past 3 months.  No problems with his breathing.  He has gained a few pounds.  Past Medical History:  Diagnosis Date  . Arthritis   . GERD (gastroesophageal reflux disease) 07/06/2014  . History of radiation therapy 12/30/2016- 02/18/2017   Right Tonsil and bilateral neck/ 70 Gy in 35 fractions to gross diseasae, 63 gy in 35 fractions to high risk nodal echelons, and 56 Gy in 35 fraction to intermediate risk nodal echelons.   . Hypertension   . Sleep apnea    does not use CPAP  . Wears glasses     Current Outpatient Medications  Medication Sig Dispense Refill  . amLODipine (NORVASC) 10 MG tablet Take 1 tablet (10 mg total) by mouth daily.    Marland Kitchen atorvastatin (LIPITOR) 40 MG tablet TAKE 1 TABLET BY MOUTH EVERY DAY 90 tablet 0  . escitalopram (LEXAPRO) 20 MG tablet TAKE 1/2 TABLET DAILY 45 tablet 3  . fluticasone (FLONASE) 50 MCG/ACT nasal spray SPRAY 2 SPRAYS INTO EACH NOSTRIL EVERY DAY 48 g 0  . lidocaine (XYLOCAINE) 2 %  solution Mix 1 part 2%viscous lidocaine,1part H2O.Swish and swallow 63mL of this mixture, 60min before meals and at bedtime, up to QID 100 mL 5  . omeprazole (PRILOSEC) 20 MG capsule TAKE 1 CAPSULE BY MOUTH EVERY DAY 90 capsule 0  . sodium fluoride (FLUORISHIELD) 1.1 % GEL dental gel Instill one drop of gel into each tooth space of fluoride tray. Place over teeth for 5 minutes. Remove. Spit out excess. Repeat nightly 120 mL prn  . prochlorperazine (COMPAZINE) 10 MG tablet Take 1 tablet (10 mg total) by mouth every 6 (six) hours as needed (Nausea or vomiting). (Patient not taking: Reported on 07/15/2017) 30 tablet 1   No current facility-administered medications for this visit.     Physical Exam BP 132/85 (BP Location: Right Arm, Patient Position: Sitting, Cuff Size: Large)   Pulse 68   Resp 16   Ht 5\' 9"  (1.753 m)   Wt 178 lb 12.8 oz (81.1 kg)   SpO2 97% Comment: ON RA  BMI 26.9 kg/m  67 year old man in no acute distress Alert and oriented x3 no focal deficits No ocular adenopathy Lungs clear bilaterally Cardiac regular rate and rhythm with a 2/6 systolic murmur  Diagnostic Tests: CT CHEST WITHOUT CONTRAST  TECHNIQUE: Multidetector CT imaging of the chest was performed following the standard protocol  without IV contrast.  COMPARISON:  06/16/17  FINDINGS: Cardiovascular: The heart size appears within normal limits. There is no pericardial effusion identified. Calcification within the LAD and RCA and left main coronary artery noted.  Mediastinum/Nodes: Normal appearance of the thyroid gland. The trachea appears patent and is midline. Normal appearance of the esophagus. No mediastinal or hilar adenopathy identified.  Lungs/Pleura: Mild to moderate changes of emphysema identified. Biapical pleuroparenchymal scarring is identified and appears unchanged from previous exam. Previously characterized hypermetabolic left apical lung nodule measuring 1.3 by 1.2 by 1.0 cm.  Previously 1.6 x 1.2 by 1.3 cm. No new pulmonary nodules identified.  Upper Abdomen: No acute findings identified within the upper abdomen. The aortic atherosclerosis noted. There is a small hiatal hernia noted. Right kidney cyst and left renal calculi noted.  Musculoskeletal: Spondylosis identified within the thoracic spine. No aggressive lytic or sclerotic bone lesions.  IMPRESSION: 1. Stable to decrease in size irregular pulmonary nodule in the posterior left lung apex, hypermetabolic on PET-CT from 19/41/7408. 2. Aortic Atherosclerosis (ICD10-I70.0) and Emphysema (ICD10-J43.9). 3. Multi vessel coronary artery atherosclerotic calcifications.   Electronically Signed   By: Kerby Moors M.D.   On: 09/09/2017 16:04 I personally reviewed the CT images and concur with the findings noted above  Impression: [name] is a 67 year old gentleman with a history of tobacco abuse and tonsillar cancer.  He had a PET CT earlier this year as follow-up posttreatment.  It showed a hypermetabolic left upper lobe nodule.  The nodule was relatively inaccessible.  It also is an area of extensive pleural-parenchymal scarring.  A follow-up scan in 6 weeks showed no change in size.  Decision was made to follow him with a repeat CT at 3 months.  His CT today shows the nodule apparently slightly smaller.  Although this does not rule out the possibility that it is a carcinoma, does make it a little less likely.  I explained to [name] that this does not mean that we no longer have to be concerned with the nodule.  Just that there was nothing pressing as to be more aggressive at this point today's scan.  We will see Dr. Isidore Moos on Friday and get her opinion.  I think would be reasonable to repeat another CT in about 3 months.  Plan: Return in 3 months after CT chest  Melrose Nakayama, MD [facility] [phone]

## 2017-09-18 ENCOUNTER — Encounter: Payer: Self-pay | Admitting: Radiation Oncology

## 2017-09-18 ENCOUNTER — Encounter: Payer: Self-pay | Admitting: *Deleted

## 2017-09-18 ENCOUNTER — Ambulatory Visit
Admission: RE | Admit: 2017-09-18 | Discharge: 2017-09-18 | Disposition: A | Discharge status: Home / Self Care | Payer: 59 | Source: Ambulatory Visit | Attending: Radiation Oncology | Admitting: Radiation Oncology

## 2017-09-18 ENCOUNTER — Other Ambulatory Visit: Payer: Self-pay

## 2017-09-18 VITALS — BP 129/84 | HR 62 | Temp 97.9°F | Resp 20 | Ht 69.0 in | Wt 176.8 lb

## 2017-09-18 DIAGNOSIS — K449 Diaphragmatic hernia without obstruction or gangrene: Secondary | ICD-10-CM | POA: Diagnosis not present

## 2017-09-18 DIAGNOSIS — I2584 Coronary atherosclerosis due to calcified coronary lesion: Secondary | ICD-10-CM | POA: Insufficient documentation

## 2017-09-18 DIAGNOSIS — C090 Malignant neoplasm of tonsillar fossa: Secondary | ICD-10-CM | POA: Insufficient documentation

## 2017-09-18 DIAGNOSIS — N200 Calculus of kidney: Secondary | ICD-10-CM | POA: Diagnosis not present

## 2017-09-18 DIAGNOSIS — Z923 Personal history of irradiation: Secondary | ICD-10-CM | POA: Insufficient documentation

## 2017-09-18 DIAGNOSIS — N281 Cyst of kidney, acquired: Secondary | ICD-10-CM | POA: Insufficient documentation

## 2017-09-18 DIAGNOSIS — R911 Solitary pulmonary nodule: Secondary | ICD-10-CM | POA: Diagnosis not present

## 2017-09-18 DIAGNOSIS — J439 Emphysema, unspecified: Secondary | ICD-10-CM | POA: Diagnosis not present

## 2017-09-18 DIAGNOSIS — I700 Atherosclerosis of aorta: Secondary | ICD-10-CM | POA: Diagnosis not present

## 2017-09-18 NOTE — Progress Notes (Signed)
Oncology Nurse Navigator Documentation  Met with Terry Pacheco during post-tmt follow-up with Dr. Isidore Moos.  He was accompanied by his wife and sister. He completed tmt 02/18/17 for p16 pos R tonsil SCC .   They voiced understanding of unremarkable 7/17 CT Chest results.  Primary concern expressed was ageusia, recognized some improvement over past months bu only able to enjoy bland foods; hypersensitive to acidic foods/drinks.  He was encouraged by this navigator and Dr. Isidore Moos to expect continuing improvement during 1st year s/p EOT. RTC so see Dr. Isidore Moos in 1 year, encouraged to call me with needs/concerns.  Gayleen Orem, RN, BSN Head & Neck Oncology Nurse [place] [facility] [place] [phone]

## 2017-09-22 ENCOUNTER — Encounter: Payer: Self-pay | Admitting: Radiation Oncology

## 2017-09-22 NOTE — Progress Notes (Addendum)
Radiation Oncology         [phone] ________________________________  Name: Terry Pacheco MRN: [id number]  Date: 09/18/2017  DOB: [birth year]  Follow-Up Visit Note  CC: Claretta Fraise, MD  Jodi Marble, MD  Diagnosis and Prior Radiotherapy:       ICD-10-CM   1. Carcinoma of tonsillar fossa ([place]) C09.0    Stage I (cT2, cN1, cM0) Right Tonsillar Squamous Cell Carcinoma     Radiation treatment dates:   12/30/2016 - 02/18/2017  Site/dose:     Right tonsil and bilateral neck / 70 Gy in 35 fractions to gross disease, 63 Gy in 35 fractions to high risk nodal echelons, and 56 Gy in 35 fractions to intermediate risk nodal echelons  CHIEF COMPLAINT:  Here for follow-up and surveillance of his right tonsillar squamous cell carcinoma  Narrative:  The patient returns today for routine follow-up.    Pain issues, if any: He reports a sore throat when swallowing. His tongue is dry.  Using a feeding tube?: Removed.   Weight changes, if any:  Wt Readings from Last 3 Encounters:  09/18/17 176 lb 12.8 oz (80.2 kg)  09/15/17 178 lb 12.8 oz (81.1 kg)  07/22/17 169 lb 12.8 oz (77 kg)   Swallowing issues, if any: He denies difficulty swallowing. Carbonated drinks burn his throat. He is using lidocaine rinses to help with burning to the right side of his tongue while eating.  Smoking or chewing tobacco? No  Using fluoride trays daily? He tells me that the trays make him nauseas at times. He is using the trays nightly.  Last ENT visit was on: Dr. Erik Obey 08/02/65, and again in 3 months.   Other notable issues, if any:  He reports continued taste changes. He is eating 1-2 meals daily and drinking 3 nutritional supplements daily.  CT Chest 09/09/17 Dr. Irene Limbo and nutrition on 09/30/17    ALLERGIES:  is allergic to crestor [rosuvastatin].  Meds: Current Outpatient Medications  Medication Sig Dispense Refill  . amLODipine (NORVASC) 10 MG tablet Take 1 tablet (10 mg total) by mouth daily.    Marland Kitchen  atorvastatin (LIPITOR) 40 MG tablet TAKE 1 TABLET BY MOUTH EVERY DAY 90 tablet 0  . escitalopram (LEXAPRO) 20 MG tablet TAKE 1/2 TABLET DAILY 45 tablet 3  . fluticasone (FLONASE) 50 MCG/ACT nasal spray SPRAY 2 SPRAYS INTO EACH NOSTRIL EVERY DAY 48 g 0  . lidocaine (XYLOCAINE) 2 % solution Mix 1 part 2%viscous lidocaine,1part H2O.Swish and swallow 69mL of this mixture, 46min before meals and at bedtime, up to QID 100 mL 5  . omeprazole (PRILOSEC) 20 MG capsule TAKE 1 CAPSULE BY MOUTH EVERY DAY 90 capsule 0  . sodium fluoride (FLUORISHIELD) 1.1 % GEL dental gel Instill one drop of gel into each tooth space of fluoride tray. Place over teeth for 5 minutes. Remove. Spit out excess. Repeat nightly 120 mL prn  . prochlorperazine (COMPAZINE) 10 MG tablet Take 1 tablet (10 mg total) by mouth every 6 (six) hours as needed (Nausea or vomiting). (Patient not taking: Reported on 07/15/2017) 30 tablet 1   No current facility-administered medications for this encounter.     Physical Findings: The patient is in no acute distress. Patient is alert and oriented. Wt Readings from Last 3 Encounters:  09/18/17 176 lb 12.8 oz (80.2 kg)  09/15/17 178 lb 12.8 oz (81.1 kg)  07/22/17 169 lb 12.8 oz (77 kg)    height is 5\' 9"  (1.753 m) and weight is 176  lb 12.8 oz (80.2 kg). His oral temperature is 97.9 F (36.6 C). His blood pressure is 129/84 and his pulse is 62. His respiration is 20 and oxygen saturation is 97%.    General: Alert and oriented, in no acute distress HEENT: Head is normocephalic.  No lesions in tongue or oropharynx Skin: healed relatively well from radiation.    Neck:  no palpable masses. Heart: RRR Chest: CTAB  Psychiatric: Judgment and insight are intact. Affect is appropriate.   Lab Findings: Lab Results  Component Value Date   WBC 4.6 06/30/2017   HGB 13.6 06/30/2017   HCT 40.5 06/30/2017   MCV 91.2 06/30/2017   PLT 163 06/30/2017    Lab Results  Component Value Date   TSH 0.673  06/05/2017    Radiographic Findings: Ct Chest Wo Contrast  Result Date: 09/09/2017 CLINICAL DATA:  Evaluate lung nodule.  History tonsillar cancer. EXAM: CT CHEST WITHOUT CONTRAST TECHNIQUE: Multidetector CT imaging of the chest was performed following the standard protocol without IV contrast. COMPARISON:  06/16/17 FINDINGS: Cardiovascular: The heart size appears within normal limits. There is no pericardial effusion identified. Calcification within the LAD and RCA and left main coronary artery noted. Mediastinum/Nodes: Normal appearance of the thyroid gland. The trachea appears patent and is midline. Normal appearance of the esophagus. No mediastinal or hilar adenopathy identified. Lungs/Pleura: Mild to moderate changes of emphysema identified. Biapical pleuroparenchymal scarring is identified and appears unchanged from previous exam. Previously characterized hypermetabolic left apical lung nodule measuring 1.3 by 1.2 by 1.0 cm. Previously 1.6 x 1.2 by 1.3 cm. No new pulmonary nodules identified. Upper Abdomen: No acute findings identified within the upper abdomen. The aortic atherosclerosis noted. There is a small hiatal hernia noted. Right kidney cyst and left renal calculi noted. Musculoskeletal: Spondylosis identified within the thoracic spine. No aggressive lytic or sclerotic bone lesions. IMPRESSION: 1. Stable to decrease in size irregular pulmonary nodule in the posterior left lung apex, hypermetabolic on PET-CT from 93/90/3009. 2. Aortic Atherosclerosis (ICD10-I70.0) and Emphysema (ICD10-J43.9). 3. Multi vessel coronary artery atherosclerotic calcifications. Electronically Signed   By: Kerby Moors M.D.   On: 09/09/2017 16:04    Impression/Plan:    1) Head and Neck Cancer Status: No evidence of disease. I reviewed his chest images - favor benign process.  Will follow with Dr Roxan Hockey as a precaution in 45mo with repeat Chest CT  2) Nutritional Status: stable   3) Risk Factors: The  patient has been educated about risk factors including alcohol and tobacco abuse; they understand that avoidance of alcohol and tobacco is important to prevent recurrences as well as other cancers  4) Swallowing: Stable. Continue swallowing exercises.  5) Dental: Encouraged to continue regular followup with dentistry, and dental hygiene including fluoride rinses.    6) Thyroid function:  Dr Irene Limbo to continue screening this annually  Lab Results  Component Value Date   TSH 0.673 06/05/2017    Follow-up w/ me in 1 year per preference of patient and family. Continue f/u with med Adella Nissen and ENT.  I spent at least 20 min face to face with patient, over 50% of time on counseling and care coordination.    Eppie Gibson, MD

## 2017-09-30 ENCOUNTER — Inpatient Hospital Stay (HOSPITAL_BASED_OUTPATIENT_CLINIC_OR_DEPARTMENT_OTHER): Payer: 59 | Admitting: Hematology

## 2017-09-30 ENCOUNTER — Inpatient Hospital Stay: Payer: 59 | Admitting: Nutrition

## 2017-09-30 ENCOUNTER — Encounter: Payer: Self-pay | Admitting: Hematology

## 2017-09-30 ENCOUNTER — Inpatient Hospital Stay: Payer: 59 | Attending: Hematology

## 2017-09-30 ENCOUNTER — Ambulatory Visit: Payer: Self-pay | Admitting: Hematology

## 2017-09-30 ENCOUNTER — Other Ambulatory Visit: Payer: Self-pay

## 2017-09-30 VITALS — BP 132/85 | HR 70 | Temp 98.1°F | Resp 18 | Ht 69.0 in | Wt 179.2 lb

## 2017-09-30 DIAGNOSIS — C099 Malignant neoplasm of tonsil, unspecified: Secondary | ICD-10-CM

## 2017-09-30 DIAGNOSIS — R911 Solitary pulmonary nodule: Secondary | ICD-10-CM

## 2017-09-30 DIAGNOSIS — I10 Essential (primary) hypertension: Secondary | ICD-10-CM

## 2017-09-30 DIAGNOSIS — C090 Malignant neoplasm of tonsillar fossa: Secondary | ICD-10-CM

## 2017-09-30 DIAGNOSIS — I700 Atherosclerosis of aorta: Secondary | ICD-10-CM | POA: Insufficient documentation

## 2017-09-30 DIAGNOSIS — I2510 Atherosclerotic heart disease of native coronary artery without angina pectoris: Secondary | ICD-10-CM

## 2017-09-30 DIAGNOSIS — Z9221 Personal history of antineoplastic chemotherapy: Secondary | ICD-10-CM

## 2017-09-30 DIAGNOSIS — Z923 Personal history of irradiation: Secondary | ICD-10-CM

## 2017-09-30 DIAGNOSIS — J439 Emphysema, unspecified: Secondary | ICD-10-CM | POA: Insufficient documentation

## 2017-09-30 DIAGNOSIS — Z87891 Personal history of nicotine dependence: Secondary | ICD-10-CM

## 2017-09-30 LAB — CBC WITH DIFFERENTIAL (CANCER CENTER ONLY)
BASOS ABS: 0 10*3/uL (ref 0.0–0.1)
BASOS PCT: 0 %
Eosinophils Absolute: 0.2 10*3/uL (ref 0.0–0.5)
Eosinophils Relative: 5 %
HEMATOCRIT: 42.6 % (ref 38.4–49.9)
Hemoglobin: 14.4 g/dL (ref 13.0–17.1)
Lymphocytes Relative: 22 %
Lymphs Abs: 1.1 10*3/uL (ref 0.9–3.3)
MCH: 30.3 pg (ref 27.2–33.4)
MCHC: 33.8 g/dL (ref 32.0–36.0)
MCV: 89.5 fL (ref 79.3–98.0)
MONO ABS: 0.5 10*3/uL (ref 0.1–0.9)
Monocytes Relative: 11 %
NEUTROS ABS: 3 10*3/uL (ref 1.5–6.5)
NEUTROS PCT: 62 %
Platelet Count: 116 10*3/uL — ABNORMAL LOW (ref 140–400)
RBC: 4.76 MIL/uL (ref 4.20–5.82)
RDW: 13.4 % (ref 11.0–14.6)
WBC: 4.8 10*3/uL (ref 4.0–10.3)

## 2017-09-30 LAB — CMP (CANCER CENTER ONLY)
ALT: 14 U/L (ref 0–44)
ANION GAP: 11 (ref 5–15)
AST: 15 U/L (ref 15–41)
Albumin: 4 g/dL (ref 3.5–5.0)
Alkaline Phosphatase: 112 U/L (ref 38–126)
BILIRUBIN TOTAL: 0.4 mg/dL (ref 0.3–1.2)
BUN: 18 mg/dL (ref 8–23)
CO2: 23 mmol/L (ref 22–32)
Calcium: 9.1 mg/dL (ref 8.9–10.3)
Chloride: 103 mmol/L (ref 98–111)
Creatinine: 1.08 mg/dL (ref 0.61–1.24)
GLUCOSE: 96 mg/dL (ref 70–99)
Potassium: 4 mmol/L (ref 3.5–5.1)
Sodium: 137 mmol/L (ref 135–145)
TOTAL PROTEIN: 6.9 g/dL (ref 6.5–8.1)

## 2017-09-30 LAB — RETICULOCYTES
RBC.: 4.76 MIL/uL (ref 4.20–5.82)
RETIC COUNT ABSOLUTE: 47.6 10*3/uL (ref 34.8–93.9)
Retic Ct Pct: 1 % (ref 0.8–1.8)

## 2017-09-30 NOTE — Progress Notes (Signed)
Nutrition follow-up completed with patient status post treatment for tonsil cancer in December 2018. Patient weight improved and documented as 179.2 pounds increased approximately 10 pounds since May 29. Patient reports he is eating a variety of foods. He no longer drinks oral nutrition supplements. He has no nutrition impact symptoms.  Nutrition diagnosis of inadequate oral intake resolved.  Educated patient to contact me if he had any questions or concerns.  He has my contact information.  **Disclaimer: This note was dictated with voice recognition software. Similar sounding words can inadvertently be transcribed and this note may contain transcription errors which may not have been corrected upon publication of note.**

## 2017-09-30 NOTE — Progress Notes (Signed)
HEMATOLOGY/ONCOLOGY FOLLOW UP VISIT NOTE  Date of Service: 09/30/2017  Patient Care Team: Claretta Fraise, MD as PCP - General (Family Medicine) Jodi Marble, MD as Consulting Physician (Otolaryngology) Eppie Gibson, MD as Attending Physician (Radiation Oncology) Leota Sauers, RN as Oncology Nurse Navigator (Oncology) Karie Mainland, RD as Dietitian (Nutrition) Terry Pacheco, PT as Physical Therapist (Physical Therapy) Sharen Counter, CCC-SLP as Speech Language Pathologist (Speech Pathology) Kennith Center, LCSW as Social Worker  CHIEF COMPLAINTS/PURPOSE OF CONSULTATION:  F/u for tonsillar carcinoma Pulmonary nodule  HISTORY OF PRESENTING ILLNESS:   Terry Pacheco is a wonderful 67 y.o. male who has been referred to [place] by ENT Specialist, Dr. Erik Pacheco for evaluation and management of invasive squamous cell carcinoma of tonsil.   He has a PMHx of GERD, HTN, High cholesterol which he takes medications for. He denies PMHx of seizures. He is accompanied by his wife and sister today. He reports that he is doing well overall. The pt initially presented with sudden-onset right lateral tongue numbness that has been ongoing for approximately 1 year. Subsequently, he had left sided gum swelling and intermittent sore throat symptoms. He notes no stroke-like symptoms. He notes that following these symptoms, he was evaluated by multiple providers including an ENT specialist and a dentist prior to meeting with Dr. Erik Pacheco. Pt wife noted swelling and discoloration inside his mouth prior to the patient being evaluated by Dr. Erik Pacheco. At his first visit with Dr. Erik Pacheco, he noted an abnormality to his left tonsil and completed a biopsy that same day.   He has had CT soft tissue neck with contrast on 11/18/2016 with results showing: Fullness of the right palatine tonsil. Surrounding fat planes are ill-defined. This area is obscured by dental artifact. Possible right tonsillar carcinoma.   The  patient has had biopsy completed on 11/19/2016 with results of: "RIGHT FAUCIAL TONSIL", BIOPSY: Squamous cell carcinoma, suspicious for superficial invasion in this material. Immunostain p16 positive.   He also had a PET scan completed on 12/02/2016 with results revealing: IMPRESSION: 1. Mildly asymmetric tonsillar activity along the right tonsillar sinus, maximum SUV 7.7 as compared to the left side 5.2, suspicious in this setting for right-sided malignancy. There is also a mildly enlarged and hypermetabolic right level IIa lymph node with maximum SUV of 8.4. No evidence of other metastatic spread. 2. Faint accentuation of activity in the left lateral prostate gland apex, but low-grade. Correlate with PSA level in determining whether further workup is warranted. 3. Other imaging findings of potential clinical significance: Aortic Atherosclerosis (ICD10-I70.0) and Emphysema (ICD10-J43.9). Coronary atherosclerosis. Small type 1 hiatal hernia. Bilateral renal cysts. Colonic diverticulosis. Lastly, he had a MR Face trigeminal on 12/09/2016 with results of IMPRESSION: 1. No evidence of perineural spread of malignancy. 2. Mild asymmetry of the palatine tonsils without discrete mass. Correlate with direct visualization.   As far as surgeries, he has had a polyp on left sided throat that was removed 3-4 years ago that resulted negative for malignancy. He also has had a right hip replacement. Lastly, he has had a left inguinal hernia operation in 1978. He started smoking cigarettes at age 36 and would smoke 1 PPD while working. When he retired several years ago he smoked 0.5 PPD and has quit smoking cigarettes 1 month ago since his diagnosis. He states that he used a vape after quitting but was advised to quit via Dr. Isidore Pacheco. He occasionally consumes ETOH. 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 Denies allergies to medications at this time. He reports that his father died of  a stroke at age 61 and his mother died from cardiac issues. Mother had gastric cancer and was diagnosed in late 5's. Denies any other cancers of blood disorders in the family.    On review of systems, he denies hematuria, dysuria, or urinary retention. He reports urinary frequency over the past year. He reports bowel incontinence following a bowel movement x 1 year that has gradually improved more recently. He notes that his bowel incontinence has been intermittent. He has well formed stools without blood in stools or rectal bleeding. He denies increase in bowel movements and he has up to 1 bowel movement daily. He has had 2-3 occurrences of hemorrhoids with no recent flares. He denies mucous in his stools. He denies injuries or surgeries to his rectal area. He denies bladder incontinence. He has lower back pain that has been chronic and unchanged. He has a prior hx of diverticulitis approximately 10 years ago that was followed with colonoscopy and was then diagnosed with diverticulosis. He denies headache at this time. He is able to ambulate for prolonged periods without dyspnea on exertion. 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 He has had an audiogram completed by an ENT specialist in [place], [place]:  Surveillance   INTERIM HISTORY:    Terry Pacheco presents today for management and evaluation of his tonsillar cancer and pulmonary nodule. The patient's last visit with [place] was on 06/30/17. He is accompanied today by his wife. The pt reports that he is doing well overall.   The pt reports that he has met with Dr. Isidore Pacheco last week and his pulmonary nodule has decreased in size as noted below. The pt notes that the right side of his tongue has felt a burning sensation and feels that there is some swelling in the back of his right jaw. He denies difficulty swallowing or breathing.   The pt notes that he has continued to gain weight  but is not quite back to his regular amount of food intake. He is keeping active.   The pt notes that he continues to follow up with his ENT Dr. Erik Pacheco and cardiothoracic surgeon Dr. Roxan Hockey.   Of note since the patient's last visit, pt has had CT Chest completed on 09/09/17 with results revealing Stable to decrease in size irregular pulmonary nodule in the posterior left lung apex, hypermetabolic on PET-CT from 37/11/6267. Aortic Atherosclerosis  and Emphysema. Multi vessel coronary artery atherosclerotic calcifications.  Lab results today (09/30/17) of CBC w/diff, CMP, and Reticulocytes is as follows: all values are WNL except for PLT at 116k.  On review of systems, pt reports swallowing well, some weight gain, mild appetite, and denies difficulty swallowing, difficulty breathing, recent infections, abdominal pains, SOB, CP, leg swelling, and any other symptoms.    MEDICAL HISTORY:  Past Medical History:  Diagnosis Date  . Aortic stenosis    Mild to moderate by echo 11/2016  . Arthritis   . GERD (gastroesophageal reflux disease) 07/06/2014  . History of radiation therapy 12/30/2016- 02/18/2017   Right Tonsil and bilateral neck/ 70 Gy in 35 fractions to gross diseasae, 63 gy in 35 fractions to high risk nodal echelons, and 56 Gy in 35 fraction to intermediate risk nodal echelons.   . Hypertension   . Sleep apnea  does not use CPAP  . Wears glasses     SURGICAL HISTORY: Past Surgical History:  Procedure Laterality Date  . HERNIA REPAIR    . IR CV LINE INJECTION  01/14/2017  . IR FLUORO GUIDE PORT INSERTION RIGHT  12/25/2016  . IR GASTROSTOMY TUBE MOD SED  12/25/2016  . IR GASTROSTOMY TUBE REMOVAL  06/05/2017  . IR REMOVAL TUN ACCESS W/ PORT W/O FL MOD SED  03/06/2017  . IR US GUIDE VASC ACCESS RIGHT  12/25/2016  . polyp removal  2008   during colonoscopy  . TOTAL HIP ARTHROPLASTY Right 10/16/2015   Procedure: RIGHT TOTAL HIP ARTHROPLASTY ANTERIOR APPROACH;  Surgeon: Paralee Cancel,  MD;  Location: [facility];  Service: Orthopedics;  Laterality: Right;  . vocal cord polyp      removed 2-3 years ago    SOCIAL HISTORY: Social History   Socioeconomic History  . Marital status: Married    Spouse name: Not on file  . Number of children: 1  . Years of education: Not on file  . Highest education level: Not on file  Occupational History  . Occupation: Retired     Fish farm manager: [employer]  Social Needs  . Financial resource strain: Not on file  . Food insecurity:    Worry: Not on file    Inability: Not on file  . Transportation needs:    Medical: Not on file    Non-medical: Not on file  Tobacco Use  . Smoking status: Former Smoker    Packs/day: 0.50    Years: 45.00    Pack years: 22.50    Types: Cigarettes    Start date: 01/12/1983    Last attempt to quit: 11/17/2016    Years since quitting: 0.8  . Smokeless tobacco: Never Used  Substance and Sexual Activity  . Alcohol use: Yes    Alcohol/week: 0.0 oz    Comment: rare  . Drug use: Yes    Types: Marijuana    Comment: last use 10/09/2015  . Sexual activity: Not on file  Lifestyle  . Physical activity:    Days per week: Not on file    Minutes per session: Not on file  . Stress: Not on file  Relationships  . Social connections:    Talks on phone: Not on file    Gets together: Not on file    Attends religious service: Not on file    Active member of club or organization: Not on file    Attends meetings of clubs or organizations: Not on file    Relationship status: Not on file  . Intimate partner violence:    Fear of current or ex partner: Not on file    Emotionally abused: Not on file    Physically abused: Not on file    Forced sexual activity: Not on file  Other Topics Concern  . Not on file  Social History Narrative  . Not on file    FAMILY HISTORY: Family History  Problem Relation Age of Onset  . Heart disease Mother   . Stroke Father     ALLERGIES:  is allergic to crestor  [rosuvastatin].  MEDICATIONS:  Current Outpatient Medications  Medication Sig Dispense Refill  . amLODipine (NORVASC) 10 MG tablet Take 1 tablet (10 mg total) by mouth daily.    Marland Kitchen atorvastatin (LIPITOR) 40 MG tablet TAKE 1 TABLET BY MOUTH EVERY DAY 90 tablet 0  . escitalopram (LEXAPRO) 20 MG tablet TAKE 1/2 TABLET DAILY 45 tablet  3  . fluticasone (FLONASE) 50 MCG/ACT nasal spray SPRAY 2 SPRAYS INTO EACH NOSTRIL EVERY DAY 48 g 0  . lidocaine (XYLOCAINE) 2 % solution Mix 1 part 2%viscous lidocaine,1part H2O.Swish and swallow 94m of this mixture, 318m before meals and at bedtime, up to QID 100 mL 5  . omeprazole (PRILOSEC) 20 MG capsule TAKE 1 CAPSULE BY MOUTH EVERY DAY 90 capsule 0  . prochlorperazine (COMPAZINE) 10 MG tablet Take 1 tablet (10 mg total) by mouth every 6 (six) hours as needed (Nausea or vomiting). (Patient not taking: Reported on 07/15/2017) 30 tablet 1  . sodium fluoride (FLUORISHIELD) 1.1 % GEL dental gel Instill one drop of gel into each tooth space of fluoride tray. Place over teeth for 5 minutes. Remove. Spit out excess. Repeat nightly 120 mL prn   No current facility-administered medications for this visit.     REVIEW OF SYSTEMS:   A 10+ POINT REVIEW OF SYSTEMS WAS OBTAINED including neurology, dermatology, psychiatry, cardiac, respiratory, lymph, extremities, GI, GU, Musculoskeletal, constitutional, breasts, reproductive, HEENT.  All pertinent positives are noted in the HPI.  All others are negative.   PHYSICAL EXAMINATION: ECOG PERFORMANCE STATUS: 1 - Symptomatic but completely ambulatory Vitals:   09/30/17 1434  BP: 132/85  Pulse: 70  Resp: 18  Temp: 98.1 F (36.7 C)  TempSrc: Oral  SpO2: 97%  Weight: 179 lb 3.2 oz (81.3 kg)  Height: 5' 9" (1.753 m)  .  GENERAL:alert, in no acute distress and comfortable SKIN: no acute rashes, no significant lesions EYES: conjunctiva are pink and non-injected, sclera anicteric OROPHARYNX: MMM, no exudates, no  oropharyngeal erythema or ulceration NECK: supple, no JVD LYMPH:  no palpable lymphadenopathy in the cervical, axillary or inguinal regions LUNGS: clear to auscultation b/l with normal respiratory effort HEART: regular rate & rhythm ABDOMEN:  normoactive bowel sounds , non tender, not distended. No palpable hepatosplenomegaly.  Extremity: no pedal edema PSYCH: alert & oriented x 3 with fluent speech NEURO: no focal motor/sensory deficits    LABORATORY DATA:  I have reviewed the data as listed   CBC Latest Ref Rng & Units 09/30/2017 06/30/2017 05/05/2017  WBC 4.0 - 10.3 K/uL 4.8 4.6 5.0  Hemoglobin 13.0 - 17.1 g/dL 14.4 13.6 14.5  Hematocrit 38.4 - 49.9 % 42.6 40.5 43.6  Platelets 140 - 400 K/uL 116(L) 163 126(L)     . CMP Latest Ref Rng & Units 09/30/2017 06/30/2017 05/05/2017  Glucose 70 - 99 mg/dL 96 94 83  BUN 8 - 23 mg/dL _0 Creatinine 0.61 - 1.24 mg/dL 1.08 0.94 0.96  Sodium 135 - 145 mmol/L 137 137 137  Potassium 3.5 - 5.1 mmol/L 4.0 4.1 4.3  Chloride 98 - 111 mmol/L 103 103 103  CO2 22 - 32 mmol/L 23 30(H) 28  Calcium 8.9 - 10.3 mg/dL 9.1 9.5 9.6  Total Protein 6.5 - 8.1 g/dL 6.9 6.7 6.6  Total Bilirubin 0.3 - 1.2 mg/dL 0.4 0.5 0.5  Alkaline Phos 38 - 126 U/L 112 123 102  AST 15 - 41 U/L _1 ALT 0 - 44 U/L _2 PATHOLOGY:    RADIOGRAPHIC STUDIES: I have personally reviewed the radiological images as listed and agreed with the findings in the report.  Ct Chest Wo Contrast  Result Date: 09/09/2017 CLINICAL DATA:  Evaluate lung nodule.  History tonsillar cancer. EXAM: CT CHEST WITHOUT CONTRAST TECHNIQUE: Multidetector CT imaging of the chest was performed following the standard protocol without  IV contrast. COMPARISON:  06/16/17 FINDINGS: Cardiovascular: The heart size appears within normal limits. There is no pericardial effusion identified. Calcification within the LAD and RCA and left main coronary artery noted. Mediastinum/Nodes: Normal appearance of the  thyroid gland. The trachea appears patent and is midline. Normal appearance of the esophagus. No mediastinal or hilar adenopathy identified. Lungs/Pleura: Mild to moderate changes of emphysema identified. Biapical pleuroparenchymal scarring is identified and appears unchanged from previous exam. Previously characterized hypermetabolic left apical lung nodule measuring 1.3 by 1.2 by 1.0 cm. Previously 1.6 x 1.2 by 1.3 cm. No new pulmonary nodules identified. Upper Abdomen: No acute findings identified within the upper abdomen. The aortic atherosclerosis noted. There is a small hiatal hernia noted. Right kidney cyst and left renal calculi noted. Musculoskeletal: Spondylosis identified within the thoracic spine. No aggressive lytic or sclerotic bone lesions. IMPRESSION: 1. Stable to decrease in size irregular pulmonary nodule in the posterior left lung apex, hypermetabolic on PET-CT from 89/21/1941. 2. Aortic Atherosclerosis (ICD10-I70.0) and Emphysema (ICD10-J43.9). 3. Multi vessel coronary artery atherosclerotic calcifications. Electronically Signed   By: Kerby Moors M.D.   On: 09/09/2017 16:04    ASSESSMENT & PLAN:   67 y.o. 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 -creatinine WNL. Some concern for decreased hearing and baseline tinnitus due to previous job noise related injury. Baseline audiogram showed normal symmetric low-frequency hearing, dropping off substantially above 2000Hz.  s/p recent completion of concurrent chemo-radiation with weekly Cisplatin, 12/30/2016 - 02/18/2017  He stopped tube feeding was 04/16/17. He is eating by mouth now.  04/30/16 PET which shows no enlarged or hypermetabolic activity in cervical lymph nodes and no residual primary focus of  head and neck disease. Has new hypermetabolic nodule in upper lobe in left lung. This favors inflammatory process.  06/17/17 CT Chest shows stable left lung nodule, with no new developments, overall stable. Will continue to monitor closely.    Plan  -Based on recent Tumor Board discussion it is possible to just watch it with another scan in 2-59month. IR Lung biopsy is not recommended given it's deep location. I will send surgical referral to discuss his possible options. -Continue Oxycodone PRN and wean as oral pain resolves -Continue fu with Dr SIsidore Moosas per her recommendation  -Discussed pt labwork today, 09/30/17; PLT at 116k, blood counts and chemistries are all otherwise WNL -Discussed the 09/09/17 CT Chest which revealed Stable to decrease in size irregular pulmonary nodule in the posterior left lung apex, hypermetabolic on PET-CT from 074/09/1446 Aortic Atherosclerosis  and Emphysema. Multi vessel coronary artery atherosclerotic calcifications. -Continue eating well, staying well hydrated, and staying active -Would like repeat imaging every 6 months and clinical observation every 3 months by myself or ENT Dr. WErik Pacheco -Will repeat PET/CT in 3 months   PET/CT in 12 weeks RTC with Dr KIrene Limboin 3 months with labs    All of the patients questions were answered with apparent satisfaction. The patient knows to call the clinic with any problems, questions or concerns.  The total time spent in the appt was 25 minutes and more than 50% was on counseling and direct patient cares.    GSullivan LoneMD MMilnerAAHIVMS SLakeway Regional HospitalCRichmond State HospitalHematology/Oncology Physician [place] (Office):  [phone] (Work cell):  [phone] (Fax):           [phone]  09/30/2017 2:57 PM  I, Baldwin [place], am acting as a scribe for Dr. Irene Limbo  .I have reviewed the above documentation for accuracy and completeness, and I agree with the above. Brunetta Genera MD

## 2017-10-01 ENCOUNTER — Telehealth: Payer: Self-pay

## 2017-10-01 DIAGNOSIS — E039 Hypothyroidism, unspecified: Secondary | ICD-10-CM | POA: Diagnosis not present

## 2017-10-01 DIAGNOSIS — C099 Malignant neoplasm of tonsil, unspecified: Secondary | ICD-10-CM | POA: Diagnosis not present

## 2017-10-01 NOTE — Telephone Encounter (Signed)
Left a detailed voice message concerning upcoming appointments that are scheduled per 8/7 los. Mailed a letter with a calender, and ct scheduler number enclosed

## 2017-10-13 ENCOUNTER — Encounter: Payer: Self-pay | Admitting: Gastroenterology

## 2017-10-13 ENCOUNTER — Ambulatory Visit: Payer: 59 | Admitting: Family Medicine

## 2017-10-13 ENCOUNTER — Encounter: Payer: Self-pay | Admitting: Family Medicine

## 2017-10-13 ENCOUNTER — Ambulatory Visit (INDEPENDENT_AMBULATORY_CARE_PROVIDER_SITE_OTHER): Payer: 59 | Admitting: Family Medicine

## 2017-10-13 VITALS — BP 112/72 | HR 72 | Temp 97.4°F | Ht 69.0 in | Wt 176.0 lb

## 2017-10-13 DIAGNOSIS — Z0000 Encounter for general adult medical examination without abnormal findings: Secondary | ICD-10-CM

## 2017-10-13 DIAGNOSIS — Z125 Encounter for screening for malignant neoplasm of prostate: Secondary | ICD-10-CM | POA: Diagnosis not present

## 2017-10-13 DIAGNOSIS — R011 Cardiac murmur, unspecified: Secondary | ICD-10-CM

## 2017-10-13 DIAGNOSIS — Z1211 Encounter for screening for malignant neoplasm of colon: Secondary | ICD-10-CM | POA: Diagnosis not present

## 2017-10-13 DIAGNOSIS — M159 Polyosteoarthritis, unspecified: Secondary | ICD-10-CM

## 2017-10-13 DIAGNOSIS — M150 Primary generalized (osteo)arthritis: Secondary | ICD-10-CM

## 2017-10-13 DIAGNOSIS — Z23 Encounter for immunization: Secondary | ICD-10-CM

## 2017-10-13 LAB — URINALYSIS
Bilirubin, UA: NEGATIVE
Glucose, UA: NEGATIVE
Ketones, UA: NEGATIVE
LEUKOCYTES UA: NEGATIVE
NITRITE UA: NEGATIVE
PH UA: 5.5 (ref 5.0–7.5)
Protein, UA: NEGATIVE
RBC UA: NEGATIVE
Specific Gravity, UA: 1.02 (ref 1.005–1.030)
UUROB: 0.2 mg/dL (ref 0.2–1.0)

## 2017-10-13 MED ORDER — ATORVASTATIN CALCIUM 40 MG PO TABS: 40.0000 mg | ORAL_TABLET | Freq: Every day | ORAL | 1 refills | Status: DC

## 2017-10-13 MED ORDER — OMEPRAZOLE 20 MG PO CPDR: DELAYED_RELEASE_CAPSULE | ORAL | 1 refills | Status: DC

## 2017-10-13 NOTE — Progress Notes (Signed)
Subjective:  Patient ID: Terry Pacheco, male    DOB: 1950-03-17  Age: 67 y.o. MRN: [id number]  CC: Annual Exam   HPI Terry Pacheco presents for complete physical. Reports generalized aching all over, more at the hands.  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   He has a past surgical history that includes Hernia repair; vocal cord polyp ; Total hip arthroplasty (Right, 10/16/2015); polyp removal (2008); IR FLUORO GUIDE PORT INSERTION RIGHT (12/25/2016); IR US Guide Vasc Access Right (12/25/2016); IR GASTROSTOMY TUBE MOD SED (12/25/2016); IR CV Line Injection (01/14/2017); IR REMOVAL TUN ACCESS W/ PORT W/O FL MOD SED (03/06/2017); and IR GASTROSTOMY TUBE REMOVAL/REPAIR (06/05/2017).   His family history includes Heart disease in his mother; Stroke in his father.He reports that he quit smoking about 10 months ago. His smoking use included cigarettes. He started smoking about 34 years ago. He has a 22.50 pack-year smoking history. He has never used smokeless tobacco. He reports that he drinks alcohol. He reports that he has current or past drug history. Drug: Marijuana.    ROS Review of Systems  Constitutional: Negative for activity change, fatigue and unexpected weight change.  HENT: Negative for congestion, ear pain, hearing loss, postnasal drip and trouble swallowing.   Eyes: Negative for pain and visual disturbance.  Respiratory: Negative for cough, chest tightness and shortness of breath.   Cardiovascular: Negative for chest pain, palpitations and leg swelling.  Gastrointestinal: Positive for  constipation. Negative for abdominal distention, abdominal pain, blood in stool, diarrhea, nausea and vomiting.  Endocrine: Negative for cold intolerance, heat intolerance and polydipsia.  Genitourinary: Negative for difficulty urinating, dysuria, flank pain, frequency and urgency.  Musculoskeletal: Positive for arthralgias. Negative for joint swelling.  Skin: Negative for color change, rash and wound.  Neurological: Negative for dizziness, syncope, speech difficulty, weakness, light-headedness, numbness and headaches.  Hematological: Does not bruise/bleed easily.  Psychiatric/Behavioral: Negative for confusion, decreased concentration, dysphoric mood and sleep disturbance. The patient is not nervous/anxious.     Objective:  BP 112/72   Pulse 72   Temp (!) 97.4 F (36.3 C) (Oral)   Ht '5\' 9"'$  (1.753 m)   Wt 176 lb (79.8 kg)   BMI 25.99 kg/m   BP Readings from Last 3 Encounters:  10/13/17 112/72  09/30/17 132/85  09/18/17 129/84    Wt Readings from Last 3 Encounters:  10/13/17 176 lb (79.8 kg)  09/30/17 179 lb 3.2 oz (81.3 kg)  09/18/17 176 lb 12.8 oz (80.2 kg)     Physical Exam  Constitutional: He is oriented to person, place, and time. He appears well-developed and well-nourished.  HENT:  Head: Normocephalic and atraumatic.  Mouth/Throat: Oropharynx is clear and moist.  Eyes: Pupils are equal, round, and reactive to light. EOM are normal.  Neck: Normal range of motion. No tracheal deviation present. No thyromegaly present.  Cardiovascular: Normal rate and regular rhythm. Exam reveals no gallop and no friction rub.  Murmur heard.  Crescendo systolic murmur is present with a grade of 2/6. Pulmonary/Chest: Breath sounds normal. He has no wheezes. He has no rales.  Abdominal: Soft.  Bowel sounds are normal. He exhibits no distension and no mass. There is no tenderness. Hernia confirmed negative in the right inguinal area and confirmed negative in the left inguinal area.    Genitourinary: Penis normal. Right testis shows no mass and no tenderness. Circumcised.  Musculoskeletal: Normal range of motion. He exhibits no edema.  Lymphadenopathy:    He has no cervical adenopathy.  Neurological: He is alert and oriented to person, place, and time.  Skin: Skin is warm and dry.  Psychiatric: He has a normal mood and affect.      Assessment & Plan:   Terry Pacheco was seen today for annual exam.  Diagnoses and all orders for this visit:  Well adult exam -     CBC with Differential/Platelet -     CMP14+EGFR -     Lipid panel  Special screening for malignant neoplasm of prostate -     PSA, total and free -     Urinalysis  Screening for colon cancer -     Ambulatory referral to Gastroenterology  Murmur, cardiac -     Ambulatory referral to Cardiology  Primary osteoarthritis involving multiple joints  Other orders -     Tdap vaccine greater than or equal to 7yo IM -     atorvastatin (LIPITOR) 40 MG tablet; Take 1 tablet (40 mg total) by mouth daily. -     omeprazole (PRILOSEC) 20 MG capsule; TAKE 1 CAPSULE BY MOUTH EVERY DAY       I have changed Terry Pacheco's atorvastatin. I am also having him maintain his sodium fluoride, prochlorperazine, lidocaine, amLODipine, fluticasone, escitalopram, and omeprazole.  Allergies as of 10/13/2017      Reactions   Crestor [rosuvastatin]    Muscle aches      Medication List        Accurate as of 10/13/17 10:06 PM. Always use your most recent med list.          amLODipine 10 MG tablet Commonly known as:  NORVASC Take 1 tablet (10 mg total) by mouth daily.   atorvastatin 40 MG tablet Commonly known as:  LIPITOR Take 1 tablet (40 mg total) by mouth daily.   escitalopram 20 MG tablet Commonly known as:  LEXAPRO TAKE 1/2 TABLET DAILY   fluticasone 50 MCG/ACT nasal spray Commonly known as:  FLONASE SPRAY 2 SPRAYS INTO EACH NOSTRIL EVERY DAY   lidocaine 2 % solution Commonly known as:  XYLOCAINE Mix  1 part 2%viscous lidocaine,1part H2O.Swish and swallow 64m of this mixture, 326m before meals and at bedtime, up to QID   omeprazole 20 MG capsule Commonly known as:  PRILOSEC TAKE 1 CAPSULE BY MOUTH EVERY DAY   prochlorperazine 10 MG tablet Commonly known as:  COMPAZINE Take 1 tablet (10 mg total) by mouth every 6 (six) hours as needed (Nausea or vomiting).   sodium fluoride 1.1 % Gel dental gel Commonly known as:  FLUORISHIELD Instill one drop of gel into each tooth space of fluoride tray. Place over teeth for 5 minutes. Remove. Spit out excess. Repeat nightly        Follow-up: Return in about 6 months (around 04/15/2018), or if symptoms worsen or fail to improve.  WaClaretta FraiseM.D.

## 2017-10-13 NOTE — Patient Instructions (Signed)

## 2017-10-14 LAB — CMP14+EGFR
ALT: 15 IU/L (ref 0–44)
AST: 14 IU/L (ref 0–40)
Albumin/Globulin Ratio: 1.9 (ref 1.2–2.2)
Albumin: 4.4 g/dL (ref 3.6–4.8)
Alkaline Phosphatase: 125 IU/L — ABNORMAL HIGH (ref 39–117)
BUN/Creatinine Ratio: 17 (ref 10–24)
BUN: 17 mg/dL (ref 8–27)
Bilirubin Total: 0.5 mg/dL (ref 0.0–1.2)
CALCIUM: 9.9 mg/dL (ref 8.6–10.2)
CO2: 22 mmol/L (ref 20–29)
CREATININE: 1.02 mg/dL (ref 0.76–1.27)
Chloride: 98 mmol/L (ref 96–106)
GFR calc Af Amer: 88 mL/min/{1.73_m2} (ref >59)
GFR, EST NON AFRICAN AMERICAN: 76 mL/min/{1.73_m2} (ref >59)
GLOBULIN, TOTAL: 2.3 g/dL (ref 1.5–4.5)
GLUCOSE: 96 mg/dL (ref 65–99)
Potassium: 4.7 mmol/L (ref 3.5–5.2)
Sodium: 138 mmol/L (ref 134–144)
Total Protein: 6.7 g/dL (ref 6.0–8.5)

## 2017-10-14 LAB — PSA, TOTAL AND FREE
PSA, Free Pct: 21.9 %
PSA, Free: 0.35 ng/mL
Prostate Specific Ag, Serum: 1.6 ng/mL (ref 0.0–4.0)

## 2017-10-14 LAB — CBC WITH DIFFERENTIAL/PLATELET
BASOS ABS: 0 10*3/uL (ref 0.0–0.2)
Basos: 0 %
EOS (ABSOLUTE): 0.3 10*3/uL (ref 0.0–0.4)
EOS: 6 %
HEMATOCRIT: 44.1 % (ref 37.5–51.0)
Hemoglobin: 15 g/dL (ref 13.0–17.7)
IMMATURE GRANULOCYTES: 0 %
Immature Grans (Abs): 0 10*3/uL (ref 0.0–0.1)
Lymphocytes Absolute: 1.3 10*3/uL (ref 0.7–3.1)
Lymphs: 26 %
MCH: 29.9 pg (ref 26.6–33.0)
MCHC: 34 g/dL (ref 31.5–35.7)
MCV: 88 fL (ref 79–97)
Monocytes Absolute: 0.4 10*3/uL (ref 0.1–0.9)
Monocytes: 9 %
NEUTROS PCT: 59 %
Neutrophils Absolute: 2.8 10*3/uL (ref 1.4–7.0)
PLATELETS: 150 10*3/uL (ref 150–450)
RBC: 5.02 x10E6/uL (ref 4.14–5.80)
RDW: 14.3 % (ref 12.3–15.4)
WBC: 4.8 10*3/uL (ref 3.4–10.8)

## 2017-10-14 LAB — LIPID PANEL
CHOLESTEROL TOTAL: 148 mg/dL (ref 100–199)
Chol/HDL Ratio: 3.4 ratio (ref 0.0–5.0)
HDL: 43 mg/dL (ref >39)
LDL CALC: 88 mg/dL (ref 0–99)
TRIGLYCERIDES: 87 mg/dL (ref 0–149)
VLDL CHOLESTEROL CAL: 17 mg/dL (ref 5–40)

## 2017-10-21 MED FILL — LIDOCAINE 2% VISCOUS SOLN: 2 | 5 days supply | Qty: 100 | Fill #3

## 2017-11-04 ENCOUNTER — Telehealth: Payer: Self-pay | Admitting: *Deleted

## 2017-11-04 ENCOUNTER — Other Ambulatory Visit: Payer: Self-pay | Admitting: Thoracic Surgery (Cardiothoracic Vascular Surgery)

## 2017-11-04 DIAGNOSIS — R911 Solitary pulmonary nodule: Secondary | ICD-10-CM

## 2017-11-04 NOTE — Telephone Encounter (Signed)
Oncology Nurse Navigator Documentation  Rec'd call from pt's wife.  She reported he had follow-up with Dr. Erik Obey 8/8, no exceptional findings though he has been experiencing tongue soreness and some swelling R side.   She noted next follow-up with Dr. Erik Obey scheduled for 74/14, Dr. Irene Limbo 11/7.  I confirmed Dr. Irene Limbo has ordered restaging PET, she asked it be scheduled so completed prior to 10/22 follow-up with Dr. Erik Obey.   I indicated can be scheduled accordingly.  Gayleen Orem, RN, BSN Head & Neck Oncology Nurse [place] at [place] [phone]

## 2017-11-10 ENCOUNTER — Telehealth: Payer: Self-pay

## 2017-11-10 NOTE — Telephone Encounter (Signed)
Janis,  This pt is cleared for anesthetic care at [facility].  Thanks,  Caroline Matters 

## 2017-11-10 NOTE — Telephone Encounter (Signed)
Terry Pacheco, This pt is scheduled for his colon on 12/01/17 with Dr Ardis Hughs. After reviewing his chart, he has a hx of cancer of the tonsil, with radiation treatment bilaterally on his neck last done in 01/2017. His last echo done in 11/2016 shows mild to mod stenosis. Can you please review his echo and medical history and advise if he is safe for [facility] or needs an OV. Thanks, Gwyndolyn Saxon in [place].

## 2017-11-17 ENCOUNTER — Ambulatory Visit (AMBULATORY_SURGERY_CENTER): Payer: Self-pay

## 2017-11-17 VITALS — Ht 69.0 in | Wt 177.2 lb

## 2017-11-17 DIAGNOSIS — Z1211 Encounter for screening for malignant neoplasm of colon: Secondary | ICD-10-CM

## 2017-11-17 MED ORDER — PEG 3350-KCL-NA BICARB-NACL 420 G PO SOLR: 4000.0000 mL | Freq: Once | ORAL | 0 refills | Status: AC

## 2017-11-17 MED FILL — PEG-3350 SOLUTION: 420 | 1 days supply | Qty: 4000 | Fill #0

## 2017-11-17 NOTE — Progress Notes (Signed)
Per pt, no allergies to soy or egg products.Pt not taking any weight loss meds or using  O2 at home.  Pt refused emmi video. 

## 2017-11-19 ENCOUNTER — Encounter: Payer: Self-pay | Admitting: Internal Medicine

## 2017-11-19 ENCOUNTER — Ambulatory Visit (INDEPENDENT_AMBULATORY_CARE_PROVIDER_SITE_OTHER): Payer: 59 | Admitting: Internal Medicine

## 2017-11-19 VITALS — BP 117/82 | HR 52 | Wt 179.4 lb

## 2017-11-19 DIAGNOSIS — I2584 Coronary atherosclerosis due to calcified coronary lesion: Secondary | ICD-10-CM | POA: Diagnosis not present

## 2017-11-19 DIAGNOSIS — I350 Nonrheumatic aortic (valve) stenosis: Secondary | ICD-10-CM

## 2017-11-19 DIAGNOSIS — Z8249 Family history of ischemic heart disease and other diseases of the circulatory system: Secondary | ICD-10-CM

## 2017-11-19 DIAGNOSIS — I2510 Atherosclerotic heart disease of native coronary artery without angina pectoris: Secondary | ICD-10-CM | POA: Diagnosis not present

## 2017-11-19 NOTE — Telephone Encounter (Signed)
Ok to proceed with colon at [facility] per Osvaldo Angst. Gwyndolyn Saxon in [facility]

## 2017-11-19 NOTE — Patient Instructions (Addendum)
Medication Instructions:   START aspirin 81mg  daily (after colonoscopy)  Continue other current medications  Labwork:  None  Testing/Procedures:  Your physician has requested that you have an echocardiogram. Echocardiography is a painless test that uses sound waves to create images of your heart. It provides your doctor with information about the size and shape of your heart and how well your heart's chambers and valves are working. This procedure takes approximately one hour. There are no restrictions for this procedure. -- done @ [address]  Your physician has requested that you have en exercise stress myoview. For further information please visit [email or web address]. Please follow instruction sheet, as given. -- done at Dr. [name] office  Follow-Up:  Your physician recommends that you schedule a follow-up appointment after testing is completed   If you need a refill on your cardiac medications before your next appointment, please call your pharmacy.  Any Other Special Instructions Will Be Listed Below (If Applicable).

## 2017-11-19 NOTE — Progress Notes (Signed)
OFFICE CONSULT NOTE  Chief Complaint:  Establish cardiologist  Primary Care Physician: Claretta Fraise, MD  HPI:  Terry Pacheco is a 67 y.o. male who is being seen today for the evaluation of aortic murmur at the request of Claretta Fraise, MD.  This is a pleasant 67 year old male who is kindly referred to evaluate an aortic murmur.  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  He was also noted to have systolic murmur and found to have aortic stenosis which is mild to moderate by echo in October 2018, with normal LV function.  Other medical problems include hypertension, GERD, dyslipidemia and strong family history of heart disease both in his mother as well as stroke in his father.  As part of ongoing work-up of his cancer he was found to have a lung nodule.  He is been followed by Dr. Roxan Hockey for this.  A CT scan demonstrated some recent decrease in the size of the lung nodule but he was noted to have multivessel coronary artery calcification and aortic atherosclerosis.  He reports he had stress testing however it was more than 10 years ago and was a treadmill stress test.  He also carries a diagnosis of sleep apnea but does not use CPAP.  He was endorsing significant fatigue while undergoing radiation therapy but reports he is slowly getting his exercise capacity back.  He denies any chest pain or significant worsening shortness of breath.  PMHx:  Past Medical History:  Diagnosis Date  . Aortic stenosis    Mild to moderate by echo 11/2016  . Arthritis   . Cancer ([place]) 10/2016   cancer of the tonsil/ 35 radiation treatments/4 doses of chemo/last radiatio 02/18/2017  . GERD (gastroesophageal reflux disease) 07/06/2014  . History of radiation therapy 12/30/2016- 02/18/2017   Right Tonsil and bilateral neck/ 70 Gy in 35 fractions to gross diseasae, 63 gy in 35 fractions to high risk nodal echelons, and 56 Gy in 35 fraction to  intermediate risk nodal echelons.   . Hypertension   . Sleep apnea    does not use CPAP  . Wears glasses     Past Surgical History:  Procedure Laterality Date  . HERNIA REPAIR     right inguinal hernia  . IR CV LINE INJECTION  01/14/2017  . IR FLUORO GUIDE PORT INSERTION RIGHT  12/25/2016  . IR GASTROSTOMY TUBE MOD SED  12/25/2016  . IR GASTROSTOMY TUBE REMOVAL  06/05/2017  . IR REMOVAL TUN ACCESS W/ PORT W/O FL MOD SED  03/06/2017  . IR US GUIDE VASC ACCESS RIGHT  12/25/2016  . polyp removal  2008   during colonoscopy  . TOTAL HIP ARTHROPLASTY Right 10/16/2015   Procedure: RIGHT TOTAL HIP ARTHROPLASTY ANTERIOR APPROACH;  Surgeon: Paralee Cancel, MD;  Location: [facility];  Service: Orthopedics;  Laterality: Right;  . vocal cord polyp      removed 2-3 years ago     FAMHx:  Family History  Problem Relation Age of Onset  . Heart disease Mother   . Stroke Father     SOCHx:   reports that he quit smoking about a year ago. His smoking use included cigarettes. He started smoking about 34 years ago. He has a 22.50 pack-year smoking history. He has never used smokeless tobacco. He reports that he drank alcohol. He reports that he has current or past drug history. Drug: Marijuana.  ALLERGIES:  Allergies  Allergen Reactions  . Crestor [  Rosuvastatin]     Muscle aches    ROS: Pertinent items noted in HPI and remainder of comprehensive ROS otherwise negative.  HOME MEDS: Current Outpatient Medications on File Prior to Visit  Medication Sig Dispense Refill  . amLODipine (NORVASC) 10 MG tablet Take 1 tablet (10 mg total) by mouth daily.    Marland Kitchen atorvastatin (LIPITOR) 40 MG tablet Take 1 tablet (40 mg total) by mouth daily. 90 tablet 1  . escitalopram (LEXAPRO) 20 MG tablet TAKE 1/2 TABLET DAILY 45 tablet 3  . fluticasone (FLONASE) 50 MCG/ACT nasal spray SPRAY 2 SPRAYS INTO EACH NOSTRIL EVERY DAY 48 g 0  . lidocaine (XYLOCAINE) 2 % solution Mix 1 part 2%viscous lidocaine,1part H2O.Swish and  swallow 76mL of this mixture, 60min before meals and at bedtime, up to QID 100 mL 5  . omeprazole (PRILOSEC) 20 MG capsule TAKE 1 CAPSULE BY MOUTH EVERY DAY 90 capsule 1  . prochlorperazine (COMPAZINE) 10 MG tablet Take 1 tablet (10 mg total) by mouth every 6 (six) hours as needed (Nausea or vomiting). 30 tablet 1  . sodium fluoride (FLUORISHIELD) 1.1 % GEL dental gel Instill one drop of gel into each tooth space of fluoride tray. Place over teeth for 5 minutes. Remove. Spit out excess. Repeat nightly 120 mL prn   No current facility-administered medications on file prior to visit.     LABS/IMAGING: No results found for this or any previous visit (from the past 48 hour(s)). No results found.  LIPID PANEL:    Component Value Date/Time   CHOL 148 10/13/2017 1015   TRIG 87 10/13/2017 1015   TRIG 177 (H) 03/14/2013 1053   HDL 43 10/13/2017 1015   HDL 34 (L) 03/14/2013 1053   CHOLHDL 3.4 10/13/2017 1015   LDLCALC 88 10/13/2017 1015   LDLCALC 55 03/14/2013 1053    WEIGHTS: Wt Readings from Last 3 Encounters:  11/19/17 179 lb 6.4 oz (81.4 kg)  11/17/17 177 lb 3.2 oz (80.4 kg)  10/13/17 176 lb (79.8 kg)    VITALS: BP 117/82   Pulse (!) 52   Wt 179 lb 6.4 oz (81.4 kg)   BMI 26.49 kg/m   EXAM: General appearance: alert and no distress Neck: no carotid bruit, no JVD and thyroid not enlarged, symmetric, no tenderness/mass/nodules Lungs: clear to auscultation bilaterally Heart: regular rate and rhythm, S1, S2 normal and systolic murmur: systolic ejection 3/6, crescendo at 2nd right intercostal space Abdomen: soft, non-tender; bowel sounds normal; no masses,  no organomegaly Extremities: extremities normal, atraumatic, no cyanosis or edema Pulses: 2+ and symmetric Skin: Skin color, texture, turgor normal. No rashes or lesions Neurologic: Grossly normal Psych: Pleasant  EKG: Sinus bradycardia 52- personally reviewed  ASSESSMENT: 1. Mild to moderate aortic stenosis  (11/2016) 2. 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 Strong family history of premature coronary artery disease 4. Recent chemotherapy and radiation for tonsillar cancer 5. Hypertension 6. Dyslipidemia  PLAN: 1.   Terry Pacheco has mild to moderate aortic stenosis last year by echo and mid peaking aortic murmur on exam.  I like to repeat an echocardiogram to see if there is been any interval worsening of his aortic valve gradient.  In addition he has multivessel coronary artery calcification and atherosclerosis.  Although he is not complaining of chest pain at this time, I would recommend an exercise perfusion stress testing to rule out obstructive coronary disease.  Blood pressure appears well controlled and he is on statin therapy with LDL recently of 88.  Based on his atherosclerosis, he would benefit from further titration to a goal LDL less than 70.  Finally, he would benefit from being on low-dose aspirin.  He is having a colonoscopy next week and I advised him not to start it until after that.  Plan follow-up with me after his studies and will address his cholesterol as well at that time.  Pixie Casino, MD, [facility], [place] Director of the Advanced Lipid Disorders &  Cardiovascular Risk Reduction Clinic Diplomate of the American Board of Clinical Lipidology Attending Cardiologist  Direct Dial: [phone]  Fax: [phone]  Website:  [email or web address] Lief Palmatier 11/19/2017, 10:03 AM

## 2017-11-20 ENCOUNTER — Telehealth (HOSPITAL_COMMUNITY): Payer: Self-pay

## 2017-11-20 NOTE — Telephone Encounter (Signed)
Encounter complete. 

## 2017-11-23 ENCOUNTER — Other Ambulatory Visit: Payer: Self-pay

## 2017-11-23 ENCOUNTER — Ambulatory Visit (HOSPITAL_COMMUNITY): Payer: 59 | Attending: Internal Medicine

## 2017-11-23 DIAGNOSIS — Z8249 Family history of ischemic heart disease and other diseases of the circulatory system: Secondary | ICD-10-CM | POA: Insufficient documentation

## 2017-11-23 DIAGNOSIS — I10 Essential (primary) hypertension: Secondary | ICD-10-CM | POA: Insufficient documentation

## 2017-11-23 DIAGNOSIS — G4733 Obstructive sleep apnea (adult) (pediatric): Secondary | ICD-10-CM | POA: Insufficient documentation

## 2017-11-23 DIAGNOSIS — I352 Nonrheumatic aortic (valve) stenosis with insufficiency: Secondary | ICD-10-CM | POA: Diagnosis not present

## 2017-11-23 DIAGNOSIS — I350 Nonrheumatic aortic (valve) stenosis: Secondary | ICD-10-CM | POA: Diagnosis not present

## 2017-11-23 DIAGNOSIS — Z9221 Personal history of antineoplastic chemotherapy: Secondary | ICD-10-CM | POA: Diagnosis not present

## 2017-11-23 DIAGNOSIS — Z87891 Personal history of nicotine dependence: Secondary | ICD-10-CM | POA: Insufficient documentation

## 2017-11-23 DIAGNOSIS — C099 Malignant neoplasm of tonsil, unspecified: Secondary | ICD-10-CM | POA: Insufficient documentation

## 2017-11-23 DIAGNOSIS — Z923 Personal history of irradiation: Secondary | ICD-10-CM | POA: Insufficient documentation

## 2017-11-25 ENCOUNTER — Ambulatory Visit (HOSPITAL_COMMUNITY)
Admission: RE | Admit: 2017-11-25 | Discharge: 2017-11-25 | Disposition: A | Discharge status: Home / Self Care | Payer: 59 | Source: Ambulatory Visit | Attending: Internal Medicine | Admitting: Internal Medicine

## 2017-11-25 DIAGNOSIS — Z8249 Family history of ischemic heart disease and other diseases of the circulatory system: Secondary | ICD-10-CM | POA: Insufficient documentation

## 2017-11-25 DIAGNOSIS — I2584 Coronary atherosclerosis due to calcified coronary lesion: Secondary | ICD-10-CM | POA: Insufficient documentation

## 2017-11-25 DIAGNOSIS — I2510 Atherosclerotic heart disease of native coronary artery without angina pectoris: Secondary | ICD-10-CM | POA: Diagnosis not present

## 2017-11-25 LAB — MYOCARDIAL PERFUSION IMAGING
CHL CUP NUCLEAR SSS: 7
CHL RATE OF PERCEIVED EXERTION: 19
CSEPEW: 10.4 METS
Exercise duration (min): 10 min
Exercise duration (sec): 25 s
LV dias vol: 92 mL (ref 62–150)
LVSYSVOL: 39 mL
MPHR: 153 {beats}/min
Peak HR: 134 {beats}/min
Percent HR: 87 %
Rest HR: 56 {beats}/min
SDS: 4
SRS: 3
TID: 0.84

## 2017-11-25 MED ORDER — TECHNETIUM TC 99M TETROFOSMIN IV KIT
9.8000 | PACK | Freq: Once | INTRAVENOUS | Status: AC | PRN
  Administered 2017-11-25: 9.8 via INTRAVENOUS
  Filled 2017-11-25: qty 10

## 2017-11-25 MED ORDER — TECHNETIUM TC 99M TETROFOSMIN IV KIT
29.8000 | PACK | Freq: Once | INTRAVENOUS | Status: AC | PRN
  Administered 2017-11-25: 29.8 via INTRAVENOUS
  Filled 2017-11-25: qty 30

## 2017-11-30 ENCOUNTER — Telehealth: Payer: Self-pay | Admitting: Gastroenterology

## 2017-11-30 NOTE — Telephone Encounter (Signed)
I was reviewing this patient's chart for planned screening colonoscopy tomorrow, and see he had a recent cardiology visit and then stress test on 10/2 with reversible defect.  Plans are for office visit with Dr. Debara Pickett 10/10 to discuss further.  I called Terry Pacheco and explained to him that I feel he should not have a routine colonoscopy until his cardiac condition is further evaluated and attended to.  Procedure canceled.  Dr. Debara Pickett,    Please keep me informed about this patient's cardiac workup so we can plan an eventual colonoscopy.  Routine screening procedure.  Thanks.  Wilfrid Lund [facility]

## 2017-12-01 ENCOUNTER — Encounter: Payer: Self-pay | Admitting: Gastroenterology

## 2017-12-01 ENCOUNTER — Telehealth: Payer: Self-pay | Admitting: *Deleted

## 2017-12-01 NOTE — Telephone Encounter (Signed)
Thanks .Marland Kitchen He'll probably have a cath. Concerned about multivessel obstructive CAD. Will keep you informed.  -[place]

## 2017-12-01 NOTE — Telephone Encounter (Signed)
Oncology Nurse Navigator Documentation  Coordinated rescheduling of re-staging PET for 10/15 11:00, 10:45 arrival [facility], to accommodate result review during 10/22 ENT and 11/7 MedOnc follow-ups.  Patient confirmed availability.  Gayleen Orem, RN, BSN Head & Neck Oncology Nurse [place] at [place] [phone]

## 2017-12-03 ENCOUNTER — Encounter: Payer: Self-pay | Admitting: Internal Medicine

## 2017-12-03 ENCOUNTER — Ambulatory Visit (INDEPENDENT_AMBULATORY_CARE_PROVIDER_SITE_OTHER): Payer: 59 | Admitting: Internal Medicine

## 2017-12-03 VITALS — BP 126/79 | HR 57 | Ht 69.0 in | Wt 178.6 lb

## 2017-12-03 DIAGNOSIS — Z01812 Encounter for preprocedural laboratory examination: Secondary | ICD-10-CM

## 2017-12-03 DIAGNOSIS — R5383 Other fatigue: Secondary | ICD-10-CM | POA: Diagnosis not present

## 2017-12-03 DIAGNOSIS — R9439 Abnormal result of other cardiovascular function study: Secondary | ICD-10-CM | POA: Diagnosis not present

## 2017-12-03 DIAGNOSIS — I2584 Coronary atherosclerosis due to calcified coronary lesion: Secondary | ICD-10-CM

## 2017-12-03 DIAGNOSIS — I350 Nonrheumatic aortic (valve) stenosis: Secondary | ICD-10-CM

## 2017-12-03 DIAGNOSIS — I2510 Atherosclerotic heart disease of native coronary artery without angina pectoris: Secondary | ICD-10-CM

## 2017-12-03 LAB — CBC
HEMATOCRIT: 44.2 % (ref 37.5–51.0)
HEMOGLOBIN: 15.2 g/dL (ref 13.0–17.7)
MCH: 30.5 pg (ref 26.6–33.0)
MCHC: 34.4 g/dL (ref 31.5–35.7)
MCV: 89 fL (ref 79–97)
Platelets: 142 10*3/uL — ABNORMAL LOW (ref 150–450)
RBC: 4.99 x10E6/uL (ref 4.14–5.80)
RDW: 13.7 % (ref 12.3–15.4)
WBC: 5.2 10*3/uL (ref 3.4–10.8)

## 2017-12-03 LAB — BASIC METABOLIC PANEL
BUN / CREAT RATIO: 17 (ref 10–24)
BUN: 19 mg/dL (ref 8–27)
CO2: 23 mmol/L (ref 20–29)
CREATININE: 1.15 mg/dL (ref 0.76–1.27)
Calcium: 9.7 mg/dL (ref 8.6–10.2)
Chloride: 98 mmol/L (ref 96–106)
GFR calc Af Amer: 76 mL/min/{1.73_m2} (ref >59)
GFR, EST NON AFRICAN AMERICAN: 65 mL/min/{1.73_m2} (ref >59)
Glucose: 87 mg/dL (ref 65–99)
Potassium: 4.6 mmol/L (ref 3.5–5.2)
SODIUM: 137 mmol/L (ref 134–144)

## 2017-12-03 NOTE — H&P (View-Only) (Signed)
OFFICE CONSULT NOTE  Chief Complaint:  Establish cardiologist  Primary Care Physician: Claretta Fraise, MD  HPI:  Terry Pacheco is a 67 y.o. male who is being seen today for the evaluation of aortic murmur at the request of Claretta Fraise, MD.  This is a pleasant 67 year old male who is kindly referred to evaluate an aortic murmur.  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  He was also noted to have systolic murmur and found to have aortic stenosis which is mild to moderate by echo in October 2018, with normal LV function.  Other medical problems include hypertension, GERD, dyslipidemia and strong family history of heart disease both in his mother as well as stroke in his father.  As part of ongoing work-up of his cancer he was found to have a lung nodule.  He is been followed by Dr. Roxan Hockey for this.  A CT scan demonstrated some recent decrease in the size of the lung nodule but he was noted to have multivessel coronary artery calcification and aortic atherosclerosis.  He reports he had stress testing however it was more than 10 years ago and was a treadmill stress test.  He also carries a diagnosis of sleep apnea but does not use CPAP.  He was endorsing significant fatigue while undergoing radiation therapy but reports he is slowly getting his exercise capacity back.  He denies any chest pain or significant worsening shortness of breath.  12/03/2017  Terry Pacheco is seen today in follow-up.  He underwent echocardiography as well as a myocardial perfusion stress testing.  The echocardiogram did show moderate aortic stenosis with an increase mean gradient from 19 to 22 mmHg compared to his prior study.  Stress testing indicated LVEF of 57% however there was a small defect of moderate severity in the basal inferior mid inferior walls which is very reversible and "worrisome for ischemia".  This study was intermediate risk.  I reviewed the  results with him today.  He reports fatigue but thinks it is getting somewhat better.  He denies any chest pain or shortness of breath, but his wife noticed that he does get short of breath with exertion still.  This was all attributed to radiotherapy.  It is difficult for them to determine if his symptoms were related to his cancer treatment or something else.  Was scheduled to undergo GI evaluation by Dr. Loletha Carrow, however given the abnormal stress test, I would prefer to determine if he has any obstructive coronary disease prior to this.  PMHx:  Past Medical History:  Diagnosis Date  . Aortic stenosis    Mild to moderate by echo 11/2016  . Arthritis   . Cancer ([place]) 10/2016   cancer of the tonsil/ 35 radiation treatments/4 doses of chemo/last radiatio 02/18/2017  . GERD (gastroesophageal reflux disease) 07/06/2014  . History of radiation therapy 12/30/2016- 02/18/2017   Right Tonsil and bilateral neck/ 70 Gy in 35 fractions to gross diseasae, 63 gy in 35 fractions to high risk nodal echelons, and 56 Gy in 35 fraction to intermediate risk nodal echelons.   . Hypertension   . Sleep apnea    does not use CPAP  . Wears glasses     Past Surgical History:  Procedure Laterality Date  . HERNIA REPAIR     right inguinal hernia  . IR CV LINE INJECTION  01/14/2017  . IR FLUORO GUIDE PORT INSERTION RIGHT  12/25/2016  . IR GASTROSTOMY TUBE MOD  SED  12/25/2016  . IR GASTROSTOMY TUBE REMOVAL  06/05/2017  . IR REMOVAL TUN ACCESS W/ PORT W/O FL MOD SED  03/06/2017  . IR US GUIDE VASC ACCESS RIGHT  12/25/2016  . polyp removal  2008   during colonoscopy  . TOTAL HIP ARTHROPLASTY Right 10/16/2015   Procedure: RIGHT TOTAL HIP ARTHROPLASTY ANTERIOR APPROACH;  Surgeon: Paralee Cancel, MD;  Location: [facility];  Service: Orthopedics;  Laterality: Right;  . vocal cord polyp      removed 2-3 years ago     FAMHx:  Family History  Problem Relation Age of Onset  . Heart disease Mother   . Stroke Father     SOCHx:     reports that he quit smoking about 12 months ago. His smoking use included cigarettes. He started smoking about 34 years ago. He has a 22.50 pack-year smoking history. He has never used smokeless tobacco. He reports that he drank alcohol. He reports that he has current or past drug history. Drug: Marijuana.  ALLERGIES:  Allergies  Allergen Reactions  . Crestor [Rosuvastatin]     Muscle aches    ROS: Pertinent items noted in HPI and remainder of comprehensive ROS otherwise negative.  HOME MEDS: Current Outpatient Medications on File Prior to Visit  Medication Sig Dispense Refill  . amLODipine (NORVASC) 10 MG tablet Take 1 tablet (10 mg total) by mouth daily.    Marland Kitchen aspirin EC 81 MG tablet Take 81 mg by mouth daily.    Marland Kitchen atorvastatin (LIPITOR) 40 MG tablet Take 1 tablet (40 mg total) by mouth daily. 90 tablet 1  . escitalopram (LEXAPRO) 20 MG tablet TAKE 1/2 TABLET DAILY 45 tablet 3  . fluticasone (FLONASE) 50 MCG/ACT nasal spray SPRAY 2 SPRAYS INTO EACH NOSTRIL EVERY DAY 48 g 0  . lidocaine (XYLOCAINE) 2 % solution Mix 1 part 2%viscous lidocaine,1part H2O.Swish and swallow 47mL of this mixture, 19min before meals and at bedtime, up to QID 100 mL 5  . omeprazole (PRILOSEC) 20 MG capsule TAKE 1 CAPSULE BY MOUTH EVERY DAY 90 capsule 1  . prochlorperazine (COMPAZINE) 10 MG tablet Take 1 tablet (10 mg total) by mouth every 6 (six) hours as needed (Nausea or vomiting). 30 tablet 1  . sodium fluoride (FLUORISHIELD) 1.1 % GEL dental gel Instill one drop of gel into each tooth space of fluoride tray. Place over teeth for 5 minutes. Remove. Spit out excess. Repeat nightly 120 mL prn   No current facility-administered medications on file prior to visit.     LABS/IMAGING: No results found for this or any previous visit (from the past 48 hour(s)). No results found.  LIPID PANEL:    Component Value Date/Time   CHOL 148 10/13/2017 1015   TRIG 87 10/13/2017 1015   TRIG 177 (H) 03/14/2013 1053    HDL 43 10/13/2017 1015   HDL 34 (L) 03/14/2013 1053   CHOLHDL 3.4 10/13/2017 1015   LDLCALC 88 10/13/2017 1015   LDLCALC 55 03/14/2013 1053    WEIGHTS: Wt Readings from Last 3 Encounters:  12/03/17 178 lb 9.6 oz (81 kg)  11/25/17 179 lb (81.2 kg)  11/19/17 179 lb 6.4 oz (81.4 kg)    VITALS: BP 126/79   Pulse (!) 57   Ht 5\' 9"  (1.753 m)   Wt 178 lb 9.6 oz (81 kg)   SpO2 95%   BMI 26.37 kg/m   EXAM: Deferred  EKG: Deferred  ASSESSMENT: 1. Mild to moderate aortic stenosis (11/2016) 2. Multivessel  coronary artery calcification/aortic atherosclerosis 3. Strong family history of premature coronary artery disease 4. Recent chemotherapy and radiation for tonsillar cancer 5. Hypertension 6. Dyslipidemia  PLAN: 1.   Mr. Baughman has had interval progression of his aortic stenosis from mild to moderate.  He does have multivessel coronary artery calcification and an abnormal stress test suggestive of inferior ischemia.  He could have balanced ischemia which is possibly worse inferiorly.  Given the fact that he was going to have colonoscopy, I would recommend we hold off on that now until we do a more definitive coronary evaluation.  I am recommending left heart catheterization.  This will help [place] determine whether or not he may have significant multivessel disease or not.  I did discuss the risks, benefits and alternatives of heart catheterization he is agreeable to proceed.  There is a small chance this could be a false positive.  We will still need to follow him for his aortic stenosis.  Plan follow-up with me afterwards.  Pixie Casino, MD, [facility], [place] Director of the Advanced Lipid Disorders &  Cardiovascular Risk Reduction Clinic Diplomate of the American Board of Clinical Lipidology Attending Cardiologist  Direct Dial: [phone]  Fax: [phone]  Website:  [email or web address]  Nadean Corwin Hilty 12/03/2017, 9:30 AM

## 2017-12-03 NOTE — Progress Notes (Signed)
OFFICE CONSULT NOTE  Chief Complaint:  Establish cardiologist  Primary Care Physician: Claretta Fraise, MD  HPI:  Terry Pacheco is a 67 y.o. male who is being seen today for the evaluation of aortic murmur at the request of Claretta Fraise, MD.  This is a pleasant 67 year old male who is kindly referred to evaluate an aortic murmur.  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  He was also noted to have systolic murmur and found to have aortic stenosis which is mild to moderate by echo in October 2018, with normal LV function.  Other medical problems include hypertension, GERD, dyslipidemia and strong family history of heart disease both in his mother as well as stroke in his father.  As part of ongoing work-up of his cancer he was found to have a lung nodule.  He is been followed by Dr. Roxan Hockey for this.  A CT scan demonstrated some recent decrease in the size of the lung nodule but he was noted to have multivessel coronary artery calcification and aortic atherosclerosis.  He reports he had stress testing however it was more than 10 years ago and was a treadmill stress test.  He also carries a diagnosis of sleep apnea but does not use CPAP.  He was endorsing significant fatigue while undergoing radiation therapy but reports he is slowly getting his exercise capacity back.  He denies any chest pain or significant worsening shortness of breath.  12/03/2017  Mr. Eagon is seen today in follow-up.  He underwent echocardiography as well as a myocardial perfusion stress testing.  The echocardiogram did show moderate aortic stenosis with an increase mean gradient from 19 to 22 mmHg compared to his prior study.  Stress testing indicated LVEF of 57% however there was a small defect of moderate severity in the basal inferior mid inferior walls which is very reversible and "worrisome for ischemia".  This study was intermediate risk.  I reviewed the  results with him today.  He reports fatigue but thinks it is getting somewhat better.  He denies any chest pain or shortness of breath, but his wife noticed that he does get short of breath with exertion still.  This was all attributed to radiotherapy.  It is difficult for them to determine if his symptoms were related to his cancer treatment or something else.  Was scheduled to undergo GI evaluation by Dr. Loletha Carrow, however given the abnormal stress test, I would prefer to determine if he has any obstructive coronary disease prior to this.  PMHx:  Past Medical History:  Diagnosis Date  . Aortic stenosis    Mild to moderate by echo 11/2016  . Arthritis   . Cancer ([place]) 10/2016   cancer of the tonsil/ 35 radiation treatments/4 doses of chemo/last radiatio 02/18/2017  . GERD (gastroesophageal reflux disease) 07/06/2014  . History of radiation therapy 12/30/2016- 02/18/2017   Right Tonsil and bilateral neck/ 70 Gy in 35 fractions to gross diseasae, 63 gy in 35 fractions to high risk nodal echelons, and 56 Gy in 35 fraction to intermediate risk nodal echelons.   . Hypertension   . Sleep apnea    does not use CPAP  . Wears glasses     Past Surgical History:  Procedure Laterality Date  . HERNIA REPAIR     right inguinal hernia  . IR CV LINE INJECTION  01/14/2017  . IR FLUORO GUIDE PORT INSERTION RIGHT  12/25/2016  . IR GASTROSTOMY TUBE MOD  SED  12/25/2016  . IR GASTROSTOMY TUBE REMOVAL  06/05/2017  . IR REMOVAL TUN ACCESS W/ PORT W/O FL MOD SED  03/06/2017  . IR US GUIDE VASC ACCESS RIGHT  12/25/2016  . polyp removal  2008   during colonoscopy  . TOTAL HIP ARTHROPLASTY Right 10/16/2015   Procedure: RIGHT TOTAL HIP ARTHROPLASTY ANTERIOR APPROACH;  Surgeon: Paralee Cancel, MD;  Location: [facility];  Service: Orthopedics;  Laterality: Right;  . vocal cord polyp      removed 2-3 years ago     FAMHx:  Family History  Problem Relation Age of Onset  . Heart disease Mother   . Stroke Father     SOCHx:     reports that he quit smoking about 12 months ago. His smoking use included cigarettes. He started smoking about 34 years ago. He has a 22.50 pack-year smoking history. He has never used smokeless tobacco. He reports that he drank alcohol. He reports that he has current or past drug history. Drug: Marijuana.  ALLERGIES:  Allergies  Allergen Reactions  . Crestor [Rosuvastatin]     Muscle aches    ROS: Pertinent items noted in HPI and remainder of comprehensive ROS otherwise negative.  HOME MEDS: Current Outpatient Medications on File Prior to Visit  Medication Sig Dispense Refill  . amLODipine (NORVASC) 10 MG tablet Take 1 tablet (10 mg total) by mouth daily.    Marland Kitchen aspirin EC 81 MG tablet Take 81 mg by mouth daily.    Marland Kitchen atorvastatin (LIPITOR) 40 MG tablet Take 1 tablet (40 mg total) by mouth daily. 90 tablet 1  . escitalopram (LEXAPRO) 20 MG tablet TAKE 1/2 TABLET DAILY 45 tablet 3  . fluticasone (FLONASE) 50 MCG/ACT nasal spray SPRAY 2 SPRAYS INTO EACH NOSTRIL EVERY DAY 48 g 0  . lidocaine (XYLOCAINE) 2 % solution Mix 1 part 2%viscous lidocaine,1part H2O.Swish and swallow 23mL of this mixture, 23min before meals and at bedtime, up to QID 100 mL 5  . omeprazole (PRILOSEC) 20 MG capsule TAKE 1 CAPSULE BY MOUTH EVERY DAY 90 capsule 1  . prochlorperazine (COMPAZINE) 10 MG tablet Take 1 tablet (10 mg total) by mouth every 6 (six) hours as needed (Nausea or vomiting). 30 tablet 1  . sodium fluoride (FLUORISHIELD) 1.1 % GEL dental gel Instill one drop of gel into each tooth space of fluoride tray. Place over teeth for 5 minutes. Remove. Spit out excess. Repeat nightly 120 mL prn   No current facility-administered medications on file prior to visit.     LABS/IMAGING: No results found for this or any previous visit (from the past 48 hour(s)). No results found.  LIPID PANEL:    Component Value Date/Time   CHOL 148 10/13/2017 1015   TRIG 87 10/13/2017 1015   TRIG 177 (H) 03/14/2013 1053    HDL 43 10/13/2017 1015   HDL 34 (L) 03/14/2013 1053   CHOLHDL 3.4 10/13/2017 1015   LDLCALC 88 10/13/2017 1015   LDLCALC 55 03/14/2013 1053    WEIGHTS: Wt Readings from Last 3 Encounters:  12/03/17 178 lb 9.6 oz (81 kg)  11/25/17 179 lb (81.2 kg)  11/19/17 179 lb 6.4 oz (81.4 kg)    VITALS: BP 126/79   Pulse (!) 57   Ht 5\' 9"  (1.753 m)   Wt 178 lb 9.6 oz (81 kg)   SpO2 95%   BMI 26.37 kg/m   EXAM: Deferred  EKG: Deferred  ASSESSMENT: 1. Mild to moderate aortic stenosis (11/2016) 2. Multivessel  coronary artery calcification/aortic atherosclerosis 3. Strong family history of premature coronary artery disease 4. Recent chemotherapy and radiation for tonsillar cancer 5. Hypertension 6. Dyslipidemia  PLAN: 1.   Mr. Mccleod has had interval progression of his aortic stenosis from mild to moderate.  He does have multivessel coronary artery calcification and an abnormal stress test suggestive of inferior ischemia.  He could have balanced ischemia which is possibly worse inferiorly.  Given the fact that he was going to have colonoscopy, I would recommend we hold off on that now until we do a more definitive coronary evaluation.  I am recommending left heart catheterization.  This will help [place] determine whether or not he may have significant multivessel disease or not.  I did discuss the risks, benefits and alternatives of heart catheterization he is agreeable to proceed.  There is a small chance this could be a false positive.  We will still need to follow him for his aortic stenosis.  Plan follow-up with me afterwards.  Pixie Casino, MD, [facility], [place] Director of the Advanced Lipid Disorders &  Cardiovascular Risk Reduction Clinic Diplomate of the American Board of Clinical Lipidology Attending Cardiologist  Direct Dial: [phone]  Fax: [phone]  Website:  [email or web address]  Nadean Corwin Kristie Bracewell 12/03/2017, 9:30 AM

## 2017-12-03 NOTE — Patient Instructions (Signed)
Medication Instructions:  Continue current medications If you need a refill on your cardiac medications before your next appointment, please call your pharmacy.   Lab work: Oncologist lab work TODAY - [place], [facility] If you have labs (blood work) drawn today and your tests are completely normal, you will receive your results only by: Marland Kitchen MyChart Message (if you have MyChart) OR . A paper copy in the mail If you have any lab test that is abnormal or we need to change your treatment, we will call you to review the results.  Testing/Procedures: Left heart cath on Monday 10/14 with Dr. Tamala Julian (instructions below)  Follow-Up: At [facility], you and your health needs are our priority.  As part of our continuing mission to provide you with exceptional heart care, we have created designated Provider Care Teams.  These Care Teams include your primary Cardiologist (physician) and Advanced Practice Providers (APPs -  Physician Assistants and Nurse Practitioners) who all work together to provide you with the care you need, when you need it. You will need a follow up appointment with Dr. Debara Pickett or PA 3-4 weeks after heart cath.  The following Advanced Practice Providers are on your designated Care Team: Almyra Deforest, [place] . Fabian Sharp, PA-C  Any Other Special Instructions Will Be Listed Below (If Applicable).      [address] [place] [address] Dept: [phone] Loc: [phone]  KRISTINA BERTONE  12/03/2017  You are scheduled for a Cardiac Catheterization on Monday, October 14 with Dr. Daneen Schick.  1. Please arrive at the [facility] (Main Entrance A) at [facility]: [address] [place], [place] [address] at 7:00 AM (This time is two hours before your procedure to ensure your preparation). Free valet parking service is available.   Special note: Every effort is made to have your procedure  done on time. Please understand that emergencies sometimes delay scheduled procedures.  2. Diet: Do not eat solid foods after midnight.  The patient may have clear liquids until 5am upon the day of the procedure.  3. Labs: TODAY  4. Medication instructions in preparation for your procedure:  On the morning of your procedure, take your Aspirin and any morning medicines NOT listed above.  You may use sips of water.  5. Plan for one night stay--bring personal belongings. 6. Bring a current list of your medications and current insurance cards. 7. You MUST have a responsible person to drive you home. 8. Someone MUST be with you the first 24 hours after you arrive home or your discharge will be delayed. 9. Please wear clothes that are easy to get on and off and wear slip-on shoes.  Thank you for allowing [place] to care for you!   -- [facility]

## 2017-12-07 ENCOUNTER — Encounter (HOSPITAL_COMMUNITY)
Admission: RE | Disposition: A | Discharge status: Home / Self Care | Payer: Self-pay | Source: Ambulatory Visit | Attending: Interventional Cardiology

## 2017-12-07 ENCOUNTER — Ambulatory Visit (HOSPITAL_COMMUNITY)
Admission: RE | Admit: 2017-12-07 | Discharge: 2017-12-07 | Disposition: A | Discharge status: Home / Self Care | Payer: 59 | Source: Ambulatory Visit | Attending: Interventional Cardiology | Admitting: Interventional Cardiology

## 2017-12-07 ENCOUNTER — Other Ambulatory Visit: Payer: Self-pay

## 2017-12-07 ENCOUNTER — Encounter (HOSPITAL_COMMUNITY): Payer: Self-pay | Admitting: Interventional Cardiology

## 2017-12-07 DIAGNOSIS — Z7982 Long term (current) use of aspirin: Secondary | ICD-10-CM | POA: Insufficient documentation

## 2017-12-07 DIAGNOSIS — R9439 Abnormal result of other cardiovascular function study: Secondary | ICD-10-CM | POA: Diagnosis present

## 2017-12-07 DIAGNOSIS — Z923 Personal history of irradiation: Secondary | ICD-10-CM | POA: Diagnosis not present

## 2017-12-07 DIAGNOSIS — I2510 Atherosclerotic heart disease of native coronary artery without angina pectoris: Secondary | ICD-10-CM | POA: Insufficient documentation

## 2017-12-07 DIAGNOSIS — M1990 Unspecified osteoarthritis, unspecified site: Secondary | ICD-10-CM | POA: Diagnosis not present

## 2017-12-07 DIAGNOSIS — Z8249 Family history of ischemic heart disease and other diseases of the circulatory system: Secondary | ICD-10-CM | POA: Diagnosis not present

## 2017-12-07 DIAGNOSIS — G4730 Sleep apnea, unspecified: Secondary | ICD-10-CM | POA: Insufficient documentation

## 2017-12-07 DIAGNOSIS — R5383 Other fatigue: Secondary | ICD-10-CM

## 2017-12-07 DIAGNOSIS — F411 Generalized anxiety disorder: Secondary | ICD-10-CM | POA: Diagnosis present

## 2017-12-07 DIAGNOSIS — Z7951 Long term (current) use of inhaled steroids: Secondary | ICD-10-CM | POA: Diagnosis not present

## 2017-12-07 DIAGNOSIS — K219 Gastro-esophageal reflux disease without esophagitis: Secondary | ICD-10-CM | POA: Diagnosis not present

## 2017-12-07 DIAGNOSIS — Z9221 Personal history of antineoplastic chemotherapy: Secondary | ICD-10-CM | POA: Insufficient documentation

## 2017-12-07 DIAGNOSIS — I10 Essential (primary) hypertension: Secondary | ICD-10-CM | POA: Insufficient documentation

## 2017-12-07 DIAGNOSIS — E785 Hyperlipidemia, unspecified: Secondary | ICD-10-CM | POA: Insufficient documentation

## 2017-12-07 DIAGNOSIS — I350 Nonrheumatic aortic (valve) stenosis: Secondary | ICD-10-CM | POA: Diagnosis not present

## 2017-12-07 DIAGNOSIS — I2584 Coronary atherosclerosis due to calcified coronary lesion: Secondary | ICD-10-CM

## 2017-12-07 HISTORY — PX: LEFT HEART CATH AND CORONARY ANGIOGRAPHY: CATH118249

## 2017-12-07 SURGERY — LEFT HEART CATH AND CORONARY ANGIOGRAPHY
Anesthesia: LOCAL

## 2017-12-07 MED ORDER — FENTANYL CITRATE (PF) 100 MCG/2ML IJ SOLN
INTRAMUSCULAR | Status: DC | PRN
  Administered 2017-12-07: 25 ug via INTRAVENOUS

## 2017-12-07 MED ORDER — FENTANYL CITRATE (PF) 100 MCG/2ML IJ SOLN
INTRAMUSCULAR | Status: AC
  Filled 2017-12-07: qty 2

## 2017-12-07 MED ORDER — LIDOCAINE HCL (PF) 1 % IJ SOLN
INTRAMUSCULAR | Status: AC
  Filled 2017-12-07: qty 30

## 2017-12-07 MED ORDER — LIDOCAINE HCL (PF) 1 % IJ SOLN
INTRAMUSCULAR | Status: DC | PRN
  Administered 2017-12-07: 2 mL via INTRADERMAL

## 2017-12-07 MED ORDER — SODIUM CHLORIDE 0.9 % IV SOLN
INTRAVENOUS | Status: DC
  Administered 2017-12-07: 08:00:00 via INTRAVENOUS

## 2017-12-07 MED ORDER — SODIUM CHLORIDE 0.9% FLUSH: 3.0000 mL | INTRAVENOUS | Status: DC | PRN

## 2017-12-07 MED ORDER — HEPARIN SODIUM (PORCINE) 1000 UNIT/ML IJ SOLN
INTRAMUSCULAR | Status: DC | PRN
  Administered 2017-12-07: 4000 [IU] via INTRAVENOUS

## 2017-12-07 MED ORDER — VERAPAMIL HCL 2.5 MG/ML IV SOLN
INTRAVENOUS | Status: DC | PRN
  Administered 2017-12-07: 10 mL via INTRA_ARTERIAL

## 2017-12-07 MED ORDER — SODIUM CHLORIDE 0.9 % IV SOLN: 250.0000 mL | INTRAVENOUS | Status: DC | PRN

## 2017-12-07 MED ORDER — ONDANSETRON HCL 4 MG/2ML IJ SOLN: 4.0000 mg | Freq: Four times a day (QID) | INTRAMUSCULAR | Status: DC | PRN

## 2017-12-07 MED ORDER — MIDAZOLAM HCL 2 MG/2ML IJ SOLN
INTRAMUSCULAR | Status: AC
  Filled 2017-12-07: qty 2

## 2017-12-07 MED ORDER — SODIUM CHLORIDE 0.9 % IV SOLN: INTRAVENOUS | Status: DC

## 2017-12-07 MED ORDER — VERAPAMIL HCL 2.5 MG/ML IV SOLN
INTRAVENOUS | Status: AC
  Filled 2017-12-07: qty 2

## 2017-12-07 MED ORDER — HEPARIN (PORCINE) IN NACL 1000-0.9 UT/500ML-% IV SOLN
INTRAVENOUS | Status: DC | PRN
  Administered 2017-12-07 (×2): 500 mL

## 2017-12-07 MED ORDER — OXYCODONE HCL 5 MG PO TABS: 5.0000 mg | ORAL_TABLET | ORAL | Status: DC | PRN

## 2017-12-07 MED ORDER — HEPARIN (PORCINE) IN NACL 1000-0.9 UT/500ML-% IV SOLN
INTRAVENOUS | Status: AC
  Filled 2017-12-07: qty 1000

## 2017-12-07 MED ORDER — ASPIRIN 81 MG PO CHEW: 81.0000 mg | CHEWABLE_TABLET | ORAL | Status: DC

## 2017-12-07 MED ORDER — ACETAMINOPHEN 325 MG PO TABS: 650.0000 mg | ORAL_TABLET | ORAL | Status: DC | PRN

## 2017-12-07 MED ORDER — MIDAZOLAM HCL 2 MG/2ML IJ SOLN
INTRAMUSCULAR | Status: DC | PRN
  Administered 2017-12-07: 1 mg via INTRAVENOUS

## 2017-12-07 MED ORDER — SODIUM CHLORIDE 0.9% FLUSH: 3.0000 mL | Freq: Two times a day (BID) | INTRAVENOUS | Status: DC

## 2017-12-07 SURGICAL SUPPLY — 10 items
CATH 5FR JL3.5 JR4 ANG PIG MP (CATHETERS) ×1 IMPLANT
DEVICE RAD COMP TR BAND LRG (VASCULAR PRODUCTS) ×1 IMPLANT
GLIDESHEATH SLEND A-KIT 6F 22G (SHEATH) ×1 IMPLANT
GUIDEWIRE INQWIRE 1.5J.035X260 (WIRE) IMPLANT
INQWIRE 1.5J .035X260CM (WIRE) ×2
KIT HEART LEFT (KITS) ×2 IMPLANT
PACK CARDIAC CATHETERIZATION (CUSTOM PROCEDURE TRAY) ×2 IMPLANT
SHEATH PROBE COVER 6X72 (BAG) ×1 IMPLANT
TRANSDUCER W/STOPCOCK (MISCELLANEOUS) ×2 IMPLANT
TUBING CIL FLEX 10 FLL-RA (TUBING) ×2 IMPLANT

## 2017-12-07 NOTE — Interval H&P Note (Signed)
Cath Lab Visit (complete for each Cath Lab visit)  Clinical Evaluation Leading to the Procedure:   ACS: No.  Non-ACS:    Anginal Classification: CCS I  Anti-ischemic medical therapy: No Therapy  Non-Invasive Test Results: Low-risk stress test findings: cardiac mortality <1%/year  Prior CABG: No previous CABG      History and Physical Interval Note:  12/07/2017 8:46 AM  Terry Pacheco  has presented today for surgery, with the diagnosis of abnormal stress test, aortic stenosis, fatique  The various methods of treatment have been discussed with the patient and family. After consideration of risks, benefits and other options for treatment, the patient has consented to  Procedure(s): LEFT HEART CATH AND CORONARY ANGIOGRAPHY (N/A) as a surgical intervention .  The patient's history has been reviewed, patient examined, no change in status, stable for surgery.  I have reviewed the patient's chart and labs.  Questions were answered to the patient's satisfaction.     Belva Crome III

## 2017-12-07 NOTE — Discharge Instructions (Signed)

## 2017-12-08 ENCOUNTER — Encounter (HOSPITAL_COMMUNITY)
Admission: RE | Admit: 2017-12-08 | Discharge: 2017-12-08 | Disposition: A | Discharge status: Home / Self Care | Payer: 59 | Source: Ambulatory Visit | Attending: Hematology | Admitting: Hematology

## 2017-12-08 DIAGNOSIS — R911 Solitary pulmonary nodule: Secondary | ICD-10-CM | POA: Diagnosis present

## 2017-12-08 LAB — GLUCOSE, CAPILLARY: Glucose-Capillary: 93 mg/dL (ref 70–99)

## 2017-12-08 MED ORDER — FLUDEOXYGLUCOSE F - 18 (FDG) INJECTION
9.3000 | Freq: Once | INTRAVENOUS | Status: AC | PRN
  Administered 2017-12-08: 9.3 via INTRAVENOUS

## 2017-12-14 ENCOUNTER — Other Ambulatory Visit: Payer: Self-pay | Admitting: Thoracic Surgery (Cardiothoracic Vascular Surgery)

## 2017-12-15 ENCOUNTER — Encounter: Payer: Self-pay | Admitting: Thoracic Surgery (Cardiothoracic Vascular Surgery)

## 2017-12-15 ENCOUNTER — Ambulatory Visit
Admission: RE | Admit: 2017-12-15 | Discharge: 2017-12-15 | Disposition: A | Discharge status: Home / Self Care | Payer: 59 | Source: Ambulatory Visit | Attending: Thoracic Surgery (Cardiothoracic Vascular Surgery) | Admitting: Thoracic Surgery (Cardiothoracic Vascular Surgery)

## 2017-12-15 ENCOUNTER — Ambulatory Visit (INDEPENDENT_AMBULATORY_CARE_PROVIDER_SITE_OTHER): Payer: 59 | Admitting: Thoracic Surgery (Cardiothoracic Vascular Surgery)

## 2017-12-15 ENCOUNTER — Other Ambulatory Visit: Payer: Self-pay

## 2017-12-15 VITALS — BP 130/86 | HR 67 | Resp 18 | Ht 69.0 in | Wt 180.8 lb

## 2017-12-15 DIAGNOSIS — R911 Solitary pulmonary nodule: Secondary | ICD-10-CM | POA: Diagnosis not present

## 2017-12-15 DIAGNOSIS — I2584 Coronary atherosclerosis due to calcified coronary lesion: Secondary | ICD-10-CM

## 2017-12-15 DIAGNOSIS — I2510 Atherosclerotic heart disease of native coronary artery without angina pectoris: Secondary | ICD-10-CM

## 2017-12-15 NOTE — Progress Notes (Signed)
[address]       [place] [address]             [phone]     HPI: Terry Pacheco returns for follow-up regarding a left upper lobe lung nodule Terry Pacheco returns for follow up re: left upper lobe lung nodule  Terry Pacheco is a 67 year old man with a history of tobacco abuse, tonsillar cancer status post chemoradiation, reflux, hypertension, hyperlipidemia, mild to moderate aortic stenosis, and sleep apnea.  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  He quit smoking at that time.  He completed treatment in December 2018.  In March 2019 he had a restaging PET/CT.  It showed a new nodule in the left apex with mild metabolic activity.  A short-term follow-up CT in April showed it was unchanged.  IR was not comfortable with a percutaneous biopsy.  Given his peripheral nature of the yield from navigational bronchoscopy would be relatively low so we elected to continue to follow the nodule.  In July he had a CT which showed the nodule was stable.  He now returns for 74-month interval follow-up.  In the interim since his last visit he is been having problems with his tongue.  He complains of a sore raised area in the posterior aspect of the right tongue and increased numbness under the tongue.  Past Medical History:  Diagnosis Date  . Aortic stenosis    Mild to moderate by echo 11/2016  . Arthritis   . Cancer ([place]) 10/2016   cancer of the tonsil/ 35 radiation treatments/4 doses of chemo/last radiatio 02/18/2017  . GERD (gastroesophageal reflux disease) 07/06/2014  . History of radiation therapy 12/30/2016- 02/18/2017   Right Tonsil and bilateral neck/ 70 Gy in 35 fractions to gross diseasae, 63 gy in 35 fractions to high risk nodal echelons, and 56 Gy in 35 fraction to intermediate risk nodal echelons.   . Hypertension   . Sleep apnea    does not use CPAP  . Wears glasses     Current Outpatient Medications  Medication Sig Dispense Refill   . acetaminophen (TYLENOL) 500 MG tablet Take 1,000 mg by mouth daily as needed for moderate pain or headache.    Marland Kitchen amLODipine (NORVASC) 10 MG tablet Take 1 tablet (10 mg total) by mouth daily.    Marland Kitchen aspirin EC 81 MG tablet Take 81 mg by mouth daily.    Marland Kitchen atorvastatin (LIPITOR) 40 MG tablet Take 1 tablet (40 mg total) by mouth daily. 90 tablet 1  . bisacodyl (DULCOLAX) 5 MG EC tablet Take 5 mg by mouth daily as needed for moderate constipation.    . calcium carbonate (TUMS - DOSED IN MG ELEMENTAL CALCIUM) 500 MG chewable tablet Chew 2 tablets by mouth daily as needed for indigestion or heartburn.    . escitalopram (LEXAPRO) 20 MG tablet TAKE 1/2 TABLET DAILY (Patient taking differently: Take 10 mg by mouth daily. ) 45 tablet 3  . fluticasone (FLONASE) 50 MCG/ACT nasal spray SPRAY 2 SPRAYS INTO EACH NOSTRIL EVERY DAY (Patient taking differently: Place 2 sprays into both nostrils daily as needed for allergies. ) 48 g 0  . lidocaine (XYLOCAINE) 2 % solution Mix 1 part 2%viscous lidocaine,1part H2O.Swish and swallow 74mL of this mixture, 67min before meals and at bedtime, up to QID (Patient taking differently: Mix 1 part 2%viscous lidocaine,1part H2O.Swish and swallow 47mL of this mixture, 30min before meals and at bedtime  as needed for mouth pain) 100 mL 5  . Liniments (SALONPAS PAIN RELIEF PATCH EX) Apply 1 patch topically daily as needed (pain).    Marland Kitchen omeprazole (PRILOSEC) 20 MG capsule TAKE 1 CAPSULE BY MOUTH EVERY DAY 90 capsule 1  . prochlorperazine (COMPAZINE) 10 MG tablet Take 1 tablet (10 mg total) by mouth every 6 (six) hours as needed (Nausea or vomiting). 30 tablet 1  . sodium fluoride (FLUORISHIELD) 1.1 % GEL dental gel Instill one drop of gel into each tooth space of fluoride tray. Place over teeth for 5 minutes. Remove. Spit out excess. Repeat nightly 120 mL prn   No current facility-administered medications for this visit.     Physical Exam BP 130/86 (BP Location: Left Arm, Patient  Position: Sitting, Cuff Size: Normal)   Pulse 67   Resp 18   Ht 5\' 9"  (1.753 m)   Wt 180 lb 12.8 oz (82 kg)   SpO2 96% Comment: RA  BMI 26.70 kg/m  67 yo man in NAD Alert and oriented x 3 Lungs clear  Diagnostic Tests: NUCLEAR MEDICINE PET SKULL BASE TO THIGH  TECHNIQUE: 9.3 mCi F-18 FDG was injected intravenously. Full-ring PET imaging was performed from the skull base to thigh after the radiotracer. CT data was obtained and used for attenuation correction and anatomic localization.  Fasting blood glucose: 93 mg/dl  COMPARISON:  04/29/2017 and CT chest 09/09/2017.  FINDINGS: Mediastinal blood pool activity: SUV max 2.93  NECK: There is a new focus of abnormal asymmetric increased uptake within the right side of tongue. This measures approximately 2 cm with SUV max of 6.65. Asymmetric increased soft tissue is identified on the corresponding CT images, image 34/4. No hypermetabolic lymph nodes within the soft tissues of the neck.  Incidental CT findings: none  CHEST: No hypermetabolic mediastinal or hilar lymph nodes. No hypermetabolic axillary or supraclavicular adenopathy.  Stable biapical pleuroparenchymal scarring. The irregular pulmonary nodule in the posterior left lung apex measures 1.2 by 1.2 cm and has low level FDG uptake within SUV max of 1.36. Previously this measured 1.2 x 1.3 cm.  Incidental CT findings: none  ABDOMEN/PELVIS: No abnormal hypermetabolic activity within the liver, pancreas, adrenal glands, or spleen. No hypermetabolic lymph nodes in the abdomen or pelvis.  Incidental CT findings: Aortic atherosclerosis. Bilateral kidney cysts.  SKELETON: No focal hypermetabolic activity to suggest skeletal metastasis.  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  IMPRESSION: 1. There is a new focus of abnormal asymmetric uptake within the right side of tongue within SUV max of 6.65. Suspicious for recurrence  of disease versus new site of disease. 2. No significant FDG uptake associated with the irregular nodule within the left lung apex which may be postinflammatory in etiology.   Electronically Signed   By: Kerby Moors M.D.   On: 12/08/2017 15:18 I personally reviewed the PET/CT and concur with the findings noted above  Impression: Terry Pacheco is a 67 year old gentleman with a history of tonsillar cancer status post chemoradiation.  About 6 months ago he was found to have a new left upper lobe lung nodule that was mildly hypermetabolic.  We have been following the nodule since then.  The nodule has not grown during the interval.  On a PET scan a week ago it actually had less metabolic activity than it did back in March.  This would be highly unusual for malignancy but is consistent with a postinflammatory nodule.  I do not think we can completely rule  out the possibility of neoplasm and he needs continued follow-up, but findings to date all point towards [place] not being cancer.  Unfortunately he is having some problems with a spot at the back of his right tongue.  There is metabolic activity in that area on the PET/CT, which is concerning for possible recurrence.  I recommended that he follow-up with Dr. Erik Obey or Dr. Isidore Moos soon as possible to have that area evaluated.  Plan: Follow-up with Dr. Erik Obey Follow-up with Dr. Isidore Moos and Dr. Irene Limbo Return in 3 months with CT chest  Melrose Nakayama, MD [facility] [phone]

## 2017-12-23 ENCOUNTER — Ambulatory Visit (HOSPITAL_COMMUNITY): Payer: 59

## 2017-12-29 ENCOUNTER — Telehealth: Payer: Self-pay | Admitting: Hematology

## 2017-12-29 NOTE — Telephone Encounter (Signed)
GK out 11/7 - per [place] moved appointments one month out. Spoke with wife re lab/fu 12/4.

## 2017-12-30 ENCOUNTER — Encounter: Payer: Self-pay | Admitting: Internal Medicine

## 2017-12-30 ENCOUNTER — Ambulatory Visit (INDEPENDENT_AMBULATORY_CARE_PROVIDER_SITE_OTHER): Payer: 59 | Admitting: Internal Medicine

## 2017-12-30 VITALS — BP 118/70 | HR 67 | Ht 69.0 in | Wt 184.0 lb

## 2017-12-30 DIAGNOSIS — I350 Nonrheumatic aortic (valve) stenosis: Secondary | ICD-10-CM

## 2017-12-30 DIAGNOSIS — E785 Hyperlipidemia, unspecified: Secondary | ICD-10-CM

## 2017-12-30 DIAGNOSIS — I2510 Atherosclerotic heart disease of native coronary artery without angina pectoris: Secondary | ICD-10-CM

## 2017-12-30 DIAGNOSIS — I2584 Coronary atherosclerosis due to calcified coronary lesion: Secondary | ICD-10-CM

## 2017-12-30 MED ORDER — EZETIMIBE 10 MG PO TABS: 10.0000 mg | ORAL_TABLET | Freq: Every day | ORAL | 3 refills | Status: DC

## 2017-12-30 NOTE — Patient Instructions (Addendum)
Medication Instructions:  START zetia 10mg  daily If you need a refill on your cardiac medications before your next appointment, please call your pharmacy.   Lab work: FASTING lab work in 3 months to reassess cholesterol If you have labs (blood work) drawn today and your tests are completely normal, you will receive your results only by: Marland Kitchen MyChart Message (if you have MyChart) OR . A paper copy in the mail If you have any lab test that is abnormal or we need to change your treatment, we will call you to review the results.  Testing/Procedures: Your physician has requested that you have an echocardiogram. Echocardiography is a painless test that uses sound waves to create images of your heart. It provides your doctor with information about the size and shape of your heart and how well your heart's chambers and valves are working. This procedure takes approximately one hour. There are no restrictions for this procedure. -- due November 2020  Follow-Up: At [facility], you and your health needs are our priority.  As part of our continuing mission to provide you with exceptional heart care, we have created designated Provider Care Teams.  These Care Teams include your primary Cardiologist (physician) and Advanced Practice Providers (APPs -  Physician Assistants and Nurse Practitioners) who all work together to provide you with the care you need, when you need it. You will need a follow up appointment in 12 months (after echocardiogram).  Please call our office 2 months in advance to schedule this appointment.  You may see Dr. Debara Pickett or one of the following Advanced Practice Providers on your designated Care Team: Almyra Deforest, [place] . Fabian Sharp, PA-C  Any Other Special Instructions Will Be Listed Below (If Applicable).

## 2017-12-30 NOTE — Progress Notes (Signed)
OFFICE CONSULT NOTE  Chief Complaint:  Follow-up cath  Primary Care Physician: Claretta Fraise, MD  HPI:  Terry Pacheco is a 67 y.o. male who is being seen today for the evaluation of aortic murmur at the request of Claretta Fraise, MD.  This is a pleasant 67 year old male who is kindly referred to evaluate an aortic murmur.  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  He was also noted to have systolic murmur and found to have aortic stenosis which is mild to moderate by echo in October 2018, with normal LV function.  Other medical problems include hypertension, GERD, dyslipidemia and strong family history of heart disease both in his mother as well as stroke in his father.  As part of ongoing work-up of his cancer he was found to have a lung nodule.  He is been followed by Dr. Roxan Hockey for this.  A CT scan demonstrated some recent decrease in the size of the lung nodule but he was noted to have multivessel coronary artery calcification and aortic atherosclerosis.  He reports he had stress testing however it was more than 10 years ago and was a treadmill stress test.  He also carries a diagnosis of sleep apnea but does not use CPAP.  He was endorsing significant fatigue while undergoing radiation therapy but reports he is slowly getting his exercise capacity back.  He denies any chest pain or significant worsening shortness of breath.  12/03/2017  Terry Pacheco is seen today in follow-up.  He underwent echocardiography as well as a myocardial perfusion stress testing.  The echocardiogram did show moderate aortic stenosis with an increase mean gradient from 19 to 22 mmHg compared to his prior study.  Stress testing indicated LVEF of 57% however there was a small defect of moderate severity in the basal inferior mid inferior walls which is very reversible and "worrisome for ischemia".  This study was intermediate risk.  I reviewed the results  with him today.  He reports fatigue but thinks it is getting somewhat better.  He denies any chest pain or shortness of breath, but his wife noticed that he does get short of breath with exertion still.  This was all attributed to radiotherapy.  It is difficult for them to determine if his symptoms were related to his cancer treatment or something else.  Was scheduled to undergo GI evaluation by Dr. Loletha Carrow, however given the abnormal stress test, I would prefer to determine if he has any obstructive coronary disease prior to this.  12/30/2017  Terry Pacheco returns today for follow-up of heart cath.  This was performed on 12/07/2017 which showed mild nonobstructive coronary disease.  He was also felt to have mild aortic stenosis with a peak to peak gradient of 26 mmHg.  He does note some improvement in his shortness of breath.  Is not had GI evaluation yet but would be considered at acceptable risk for that work-up.  PMHx:  Past Medical History:  Diagnosis Date  . Aortic stenosis    Mild to moderate by echo 11/2016  . Arthritis   . Cancer ([place]) 10/2016   cancer of the tonsil/ 35 radiation treatments/4 doses of chemo/last radiatio 02/18/2017  . GERD (gastroesophageal reflux disease) 07/06/2014  . History of radiation therapy 12/30/2016- 02/18/2017   Right Tonsil and bilateral neck/ 70 Gy in 35 fractions to gross diseasae, 63 gy in 35 fractions to high risk nodal echelons, and 56 Gy in 35 fraction  to intermediate risk nodal echelons.   . Hypertension   . Sleep apnea    does not use CPAP  . Wears glasses     Past Surgical History:  Procedure Laterality Date  . HERNIA REPAIR     right inguinal hernia  . IR CV LINE INJECTION  01/14/2017  . IR FLUORO GUIDE PORT INSERTION RIGHT  12/25/2016  . IR GASTROSTOMY TUBE MOD SED  12/25/2016  . IR GASTROSTOMY TUBE REMOVAL  06/05/2017  . IR REMOVAL TUN ACCESS W/ PORT W/O FL MOD SED  03/06/2017  . IR US GUIDE VASC ACCESS RIGHT  12/25/2016  . LEFT HEART CATH AND  CORONARY ANGIOGRAPHY N/A 12/07/2017   Procedure: LEFT HEART CATH AND CORONARY ANGIOGRAPHY;  Surgeon: Belva Crome, MD;  Location: [facility];  Service: Cardiovascular;  Laterality: N/A;  . polyp removal  2008   during colonoscopy  . TOTAL HIP ARTHROPLASTY Right 10/16/2015   Procedure: RIGHT TOTAL HIP ARTHROPLASTY ANTERIOR APPROACH;  Surgeon: Paralee Cancel, MD;  Location: [facility];  Service: Orthopedics;  Laterality: Right;  . vocal cord polyp      removed 2-3 years ago     FAMHx:  Family History  Problem Relation Age of Onset  . Heart disease Mother   . Stroke Father     SOCHx:   reports that he quit smoking about 13 months ago. His smoking use included cigarettes. He started smoking about 34 years ago. He has a 22.50 pack-year smoking history. He has never used smokeless tobacco. He reports that he drank alcohol. He reports that he has current or past drug history. Drug: Marijuana.  ALLERGIES:  Allergies  Allergen Reactions  . Crestor [Rosuvastatin]     Muscle aches    ROS: Pertinent items noted in HPI and remainder of comprehensive ROS otherwise negative.  HOME MEDS: Current Outpatient Medications on File Prior to Visit  Medication Sig Dispense Refill  . acetaminophen (TYLENOL) 500 MG tablet Take 1,000 mg by mouth daily as needed for moderate pain or headache.    Marland Kitchen amLODipine (NORVASC) 10 MG tablet Take 1 tablet (10 mg total) by mouth daily.    Marland Kitchen aspirin EC 81 MG tablet Take 81 mg by mouth daily.    Marland Kitchen atorvastatin (LIPITOR) 40 MG tablet Take 1 tablet (40 mg total) by mouth daily. 90 tablet 1  . bisacodyl (DULCOLAX) 5 MG EC tablet Take 5 mg by mouth daily as needed for moderate constipation.    . calcium carbonate (TUMS - DOSED IN MG ELEMENTAL CALCIUM) 500 MG chewable tablet Chew 2 tablets by mouth daily as needed for indigestion or heartburn.    . escitalopram (LEXAPRO) 20 MG tablet TAKE 1/2 TABLET DAILY (Patient taking differently: Take 10 mg by mouth daily. ) 45  tablet 3  . fluticasone (FLONASE) 50 MCG/ACT nasal spray SPRAY 2 SPRAYS INTO EACH NOSTRIL EVERY DAY (Patient taking differently: Place 2 sprays into both nostrils daily as needed for allergies. ) 48 g 0  . lidocaine (XYLOCAINE) 2 % solution Mix 1 part 2%viscous lidocaine,1part H2O.Swish and swallow 35mL of this mixture, 101min before meals and at bedtime, up to QID (Patient taking differently: Mix 1 part 2%viscous lidocaine,1part H2O.Swish and swallow 27mL of this mixture, 35min before meals and at bedtime as needed for mouth pain) 100 mL 5  . Liniments (SALONPAS PAIN RELIEF PATCH EX) Apply 1 patch topically daily as needed (pain).    Marland Kitchen omeprazole (PRILOSEC) 20 MG capsule TAKE 1 CAPSULE BY  MOUTH EVERY DAY 90 capsule 1  . prochlorperazine (COMPAZINE) 10 MG tablet Take 1 tablet (10 mg total) by mouth every 6 (six) hours as needed (Nausea or vomiting). 30 tablet 1  . sodium fluoride (FLUORISHIELD) 1.1 % GEL dental gel Instill one drop of gel into each tooth space of fluoride tray. Place over teeth for 5 minutes. Remove. Spit out excess. Repeat nightly 120 mL prn   No current facility-administered medications on file prior to visit.     LABS/IMAGING: No results found for this or any previous visit (from the past 48 hour(s)). No results found.  LIPID PANEL:    Component Value Date/Time   CHOL 148 10/13/2017 1015   TRIG 87 10/13/2017 1015   TRIG 177 (H) 03/14/2013 1053   HDL 43 10/13/2017 1015   HDL 34 (L) 03/14/2013 1053   CHOLHDL 3.4 10/13/2017 1015   LDLCALC 88 10/13/2017 1015   LDLCALC 55 03/14/2013 1053    WEIGHTS: Wt Readings from Last 3 Encounters:  12/30/17 184 lb (83.5 kg)  12/15/17 180 lb 12.8 oz (82 kg)  12/07/17 179 lb (81.2 kg)    VITALS: BP 118/70 (BP Location: Right Arm, Patient Position: Sitting, Cuff Size: Normal)   Pulse 67   Ht 5\' 9"  (1.753 m)   Wt 184 lb (83.5 kg)   BMI 27.17 kg/m   EXAM: Deferred  EKG: Deferred  ASSESSMENT: 1. Mild aortic stenosis  (11/2017) 2. Mild nonobstructive coronary disease by cath (11/2017) 3. Strong family history of premature coronary artery disease 4. Recent chemotherapy and radiation for tonsillar cancer 5. Hypertension 6. Dyslipidemia  PLAN: 1.   Terry Pacheco urgently had mild nonobstructive coronary disease by cath in October 2019.  He was also thought to have mild aortic stenosis with a peak to peak gradient of 26 mmHg.  His dyspnea is improved somewhat.  He is okay to undergo GI evaluation as necessary.  Aggressive lipid therapy is recommended.  His LDL on statin therapy is 88, above goal less than 70.  Commend adding Zetia 10 mg daily to titrate LDL less than 70.  Repeat lipids in 3 months.  He will need a repeat echo probably in a year.  Follow-up with me annually or sooner as necessary.  Pixie Casino, MD, [facility], [place] Director of the [facility] Lipid Disorders &  Card[facility] Diplomate of the American Board of Clinical Lipidology Attending Cardiologist  Direct Dial: [phone]  Fax: [phone]  Website:  [email or web address] 12/30/2017, 10:59 AM

## 2017-12-31 ENCOUNTER — Inpatient Hospital Stay: Payer: 59

## 2017-12-31 ENCOUNTER — Inpatient Hospital Stay: Payer: 59 | Admitting: Hematology

## 2018-01-01 ENCOUNTER — Encounter: Payer: Self-pay | Admitting: Internal Medicine

## 2018-01-03 DIAGNOSIS — Z23 Encounter for immunization: Secondary | ICD-10-CM | POA: Diagnosis not present

## 2018-01-05 ENCOUNTER — Other Ambulatory Visit: Payer: Self-pay | Admitting: Otolaryngology

## 2018-01-05 DIAGNOSIS — C099 Malignant neoplasm of tonsil, unspecified: Secondary | ICD-10-CM

## 2018-01-13 ENCOUNTER — Ambulatory Visit
Admission: RE | Admit: 2018-01-13 | Discharge: 2018-01-13 | Disposition: A | Discharge status: Home / Self Care | Payer: 59 | Source: Ambulatory Visit | Attending: Otolaryngology | Admitting: Otolaryngology

## 2018-01-13 DIAGNOSIS — C099 Malignant neoplasm of tonsil, unspecified: Secondary | ICD-10-CM

## 2018-01-13 MED ORDER — GADOBENATE DIMEGLUMINE 529 MG/ML IV SOLN
14.0000 mL | Freq: Once | INTRAVENOUS | Status: AC | PRN
  Administered 2018-01-13: 14 mL via INTRAVENOUS

## 2018-01-25 ENCOUNTER — Other Ambulatory Visit: Payer: Self-pay | Admitting: Family Medicine

## 2018-01-26 NOTE — Progress Notes (Signed)
HEMATOLOGY/ONCOLOGY FOLLOW UP VISIT NOTE  Date of Service: 01/27/2018  Patient Care Team: Claretta Fraise, MD as PCP - General (Family Medicine) Jodi Marble, MD as Consulting Physician (Otolaryngology) Eppie Gibson, MD as Attending Physician (Radiation Oncology) Leota Sauers, RN as Oncology Nurse Navigator (Oncology) Karie Mainland, RD as Dietitian (Nutrition) Jomarie Longs, PT as Physical Therapist (Physical Therapy) Sharen Counter, CCC-SLP as Speech Language Pathologist (Speech Pathology) Kennith Center, LCSW as Social Worker  CHIEF COMPLAINTS/PURPOSE OF CONSULTATION:  F/u for tonsillar carcinoma Pulmonary nodule  HISTORY OF PRESENTING ILLNESS:   Terry Pacheco is a wonderful 67 y.o. male who has been referred to [place] by ENT Specialist, Dr. Erik Obey for evaluation and management of invasive squamous cell carcinoma of tonsil.   He has a PMHx of GERD, HTN, High cholesterol which he takes medications for. He denies PMHx of seizures. He is accompanied by his wife and sister today. He reports that he is doing well overall. The pt initially presented with sudden-onset right lateral tongue numbness that has been ongoing for approximately 1 year. Subsequently, he had left sided gum swelling and intermittent sore throat symptoms. He notes no stroke-like symptoms. He notes that following these symptoms, he was evaluated by multiple providers including an ENT specialist and a dentist prior to meeting with Dr. Erik Obey. Pt wife noted swelling and discoloration inside his mouth prior to the patient being evaluated by Dr. Erik Obey. At his first visit with Dr. Erik Obey, he noted an abnormality to his left tonsil and completed a biopsy that same day.   He has had CT soft tissue neck with contrast on 11/18/2016 with results showing: Fullness of the right palatine tonsil. Surrounding fat planes are ill-defined. This area is obscured by dental artifact. Possible right tonsillar carcinoma.    The patient has had biopsy completed on 11/19/2016 with results of: "RIGHT FAUCIAL TONSIL", BIOPSY: Squamous cell carcinoma, suspicious for superficial invasion in this material. Immunostain p16 positive.   He also had a PET scan completed on 12/02/2016 with results revealing: IMPRESSION: 1. Mildly asymmetric tonsillar activity along the right tonsillar sinus, maximum SUV 7.7 as compared to the left side 5.2, suspicious in this setting for right-sided malignancy. There is also a mildly enlarged and hypermetabolic right level IIa lymph node with maximum SUV of 8.4. No evidence of other metastatic spread. 2. Faint accentuation of activity in the left lateral prostate gland apex, but low-grade. Correlate with PSA level in determining whether further workup is warranted. 3. Other imaging findings of potential clinical significance: Aortic Atherosclerosis (ICD10-I70.0) and Emphysema (ICD10-J43.9). Coronary atherosclerosis. Small type 1 hiatal hernia. Bilateral renal cysts. Colonic diverticulosis. Lastly, he had a MR Face trigeminal on 12/09/2016 with results of IMPRESSION: 1. No evidence of perineural spread of malignancy. 2. Mild asymmetry of the palatine tonsils without discrete mass. Correlate with direct visualization.   As far as surgeries, he has had a polyp on left sided throat that was removed 3-4 years ago that resulted negative for malignancy. He also has had a right hip replacement. Lastly, he has had a left inguinal hernia operation in 1978. He started smoking cigarettes at age 26 and would smoke 1 PPD while working. When he retired several years ago he smoked 0.5 PPD and has quit smoking cigarettes 1 month ago since his diagnosis. He states that he used a vape after quitting but was advised to quit via Dr. Isidore Moos. He occasionally consumes ETOH. 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 Denies allergies to medications at this time. He reports that his father  died of a stroke at age 56 and his mother died from cardiac issues. Mother had gastric cancer and was diagnosed in late 11's. Denies any other cancers of blood disorders in the family.    On review of systems, he denies hematuria, dysuria, or urinary retention. He reports urinary frequency over the past year. He reports bowel incontinence following a bowel movement x 1 year that has gradually improved more recently. He notes that his bowel incontinence has been intermittent. He has well formed stools without blood in stools or rectal bleeding. He denies increase in bowel movements and he has up to 1 bowel movement daily. He has had 2-3 occurrences of hemorrhoids with no recent flares. He denies mucous in his stools. He denies injuries or surgeries to his rectal area. He denies bladder incontinence. He has lower back pain that has been chronic and unchanged. He has a prior hx of diverticulitis approximately 10 years ago that was followed with colonoscopy and was then diagnosed with diverticulosis. He denies headache at this time. He is able to ambulate for prolonged periods without dyspnea on exertion. 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 He has had an audiogram completed by an ENT specialist in [place], [place]:  Surveillance   INTERIM HISTORY:    Terry Pacheco presents today for management and evaluation of his tonsillar cancer and pulmonary nodule. The patient's last visit with [place] was on 09/30/17. He is accompanied today by his wife. The pt reports that he is doing well overall.   The pt notes that he has not had any recent SOB and denies any recent heart symptomatology. He notes that he has been eating well. He perceives that the right side of his tongue feels more numb, which fluctuates in severity but is overall a constant feeling. The patient's tongue numbness was present for about a year prior to treatment. He notes that his  taste perceives lots of sour tastes, which is stable.   In the interim, the pt saw ENT Dr. Erik Obey on 96/22/29, which was after the 12/18/17 PET/CT, as noted below. Dr. Erik Obey did not find any obvious concerns during clinical examination, and a follow up MRI was ordered and completed on 01/13/18, also as noted below.  The pt reports that he has been gaining weight and has been eating very well. He notes that he does not feel any pain in his mouth and notes that swallowing food has become much easier. He notes that his mouth has some baseline dryness, for which he drinks lots of water.   Of note since the patient's last visit, pt has had an MRI Neck completed on 01/13/18 with results revealing No evident mass to explain PET findings, which may have been muscular. 2. Negative for nodal disease.  The pt also had a PET/CT completed on 12/08/17 which revealed There is a new focus of abnormal asymmetric uptake within the right side of tongue within SUV max of 6.65. Suspicious for recurrence of disease versus new site of disease. 2. No significant FDG uptake associated with the irregular nodule within the left lung apex which may be postinflammatory in etiology  Lab results today (01/27/18) of CBC w/diff and CMP is as follows: all values are WNL except for PLT at 122k, GFR at 60.  On  review of systems, pt reports eating well, weight gain, staying active, and denies SOB, heart symptoms, coughing, concerns for choking, pain in the mouth, abdominal pains, leg swelling, new skin rashes, and any other symptoms.   MEDICAL HISTORY:  Past Medical History:  Diagnosis Date  . Aortic stenosis    Mild to moderate by echo 11/2016  . Arthritis   . Cancer ([place]) 10/2016   cancer of the tonsil/ 35 radiation treatments/4 doses of chemo/last radiatio 02/18/2017  . GERD (gastroesophageal reflux disease) 07/06/2014  . History of radiation therapy 12/30/2016- 02/18/2017   Right Tonsil and bilateral neck/ 70 Gy in 35  fractions to gross diseasae, 63 gy in 35 fractions to high risk nodal echelons, and 56 Gy in 35 fraction to intermediate risk nodal echelons.   . Hypertension   . Sleep apnea    does not use CPAP  . Wears glasses     SURGICAL HISTORY: Past Surgical History:  Procedure Laterality Date  . HERNIA REPAIR     right inguinal hernia  . IR CV LINE INJECTION  01/14/2017  . IR FLUORO GUIDE PORT INSERTION RIGHT  12/25/2016  . IR GASTROSTOMY TUBE MOD SED  12/25/2016  . IR GASTROSTOMY TUBE REMOVAL  06/05/2017  . IR REMOVAL TUN ACCESS W/ PORT W/O FL MOD SED  03/06/2017  . IR US GUIDE VASC ACCESS RIGHT  12/25/2016  . LEFT HEART CATH AND CORONARY ANGIOGRAPHY N/A 12/07/2017   Procedure: LEFT HEART CATH AND CORONARY ANGIOGRAPHY;  Surgeon: Belva Crome, MD;  Location: [facility];  Service: Cardiovascular;  Laterality: N/A;  . polyp removal  2008   during colonoscopy  . TOTAL HIP ARTHROPLASTY Right 10/16/2015   Procedure: RIGHT TOTAL HIP ARTHROPLASTY ANTERIOR APPROACH;  Surgeon: Paralee Cancel, MD;  Location: [facility];  Service: Orthopedics;  Laterality: Right;  . vocal cord polyp      removed 2-3 years ago    SOCIAL HISTORY: Social History   Socioeconomic History  . Marital status: Married    Spouse name: Not on file  . Number of children: 1  . Years of education: Not on file  . Highest education level: Not on file  Occupational History  . Occupation: Retired     Fish farm manager: [employer]  Social Needs  . Financial resource strain: Not on file  . Food insecurity:    Worry: Not on file    Inability: Not on file  . Transportation needs:    Medical: Not on file    Non-medical: Not on file  Tobacco Use  . Smoking status: Former Smoker    Packs/day: 0.50    Years: 45.00    Pack years: 22.50    Types: Cigarettes    Start date: 01/12/1983    Last attempt to quit: 11/17/2016    Years since quitting: 1.1  . Smokeless tobacco: Never Used  Substance and Sexual Activity  . Alcohol use:  Not Currently    Alcohol/week: 0.0 standard drinks  . Drug use: Yes    Types: Marijuana    Comment: last use 10/09/2015  . Sexual activity: Not on file  Lifestyle  . Physical activity:    Days per week: Not on file    Minutes per session: Not on file  . Stress: Not on file  Relationships  . Social connections:    Talks on phone: Not on file    Gets together: Not on file    Attends religious service: Not on file  Active member of club or organization: Not on file    Attends meetings of clubs or organizations: Not on file    Relationship status: Not on file  . Intimate partner violence:    Fear of current or ex partner: Not on file    Emotionally abused: Not on file    Physically abused: Not on file    Forced sexual activity: Not on file  Other Topics Concern  . Not on file  Social History Narrative  . Not on file    FAMILY HISTORY: Family History  Problem Relation Age of Onset  . Heart disease Mother   . Stroke Father     ALLERGIES:  is allergic to crestor [rosuvastatin].  MEDICATIONS:  Current Outpatient Medications  Medication Sig Dispense Refill  . acetaminophen (TYLENOL) 500 MG tablet Take 1,000 mg by mouth daily as needed for moderate pain or headache.    Marland Kitchen amLODipine (NORVASC) 10 MG tablet TAKE 1 TABLET EVERY DAY FOR FOR BLOOD PRESSURE **NEED OFFICE VISIT FOR MORE REFILLS 90 tablet 1  . aspirin EC 81 MG tablet Take 81 mg by mouth daily.    Marland Kitchen atorvastatin (LIPITOR) 40 MG tablet Take 1 tablet (40 mg total) by mouth daily. 90 tablet 1  . bisacodyl (DULCOLAX) 5 MG EC tablet Take 5 mg by mouth daily as needed for moderate constipation.    . calcium carbonate (TUMS - DOSED IN MG ELEMENTAL CALCIUM) 500 MG chewable tablet Chew 2 tablets by mouth daily as needed for indigestion or heartburn.    . escitalopram (LEXAPRO) 20 MG tablet TAKE 1/2 TABLET DAILY (Patient taking differently: Take 10 mg by mouth daily. ) 45 tablet 3  . ezetimibe (ZETIA) 10 MG tablet Take 1 tablet  (10 mg total) by mouth daily. 90 tablet 3  . fluticasone (FLONASE) 50 MCG/ACT nasal spray SPRAY 2 SPRAYS INTO EACH NOSTRIL EVERY DAY (Patient taking differently: Place 2 sprays into both nostrils daily as needed for allergies. ) 48 g 0  . lidocaine (XYLOCAINE) 2 % solution Mix 1 part 2%viscous lidocaine,1part H2O.Swish and swallow 50mL of this mixture, 29min before meals and at bedtime, up to QID (Patient taking differently: Mix 1 part 2%viscous lidocaine,1part H2O.Swish and swallow 34mL of this mixture, 23min before meals and at bedtime as needed for mouth pain) 100 mL 5  . Liniments (SALONPAS PAIN RELIEF PATCH EX) Apply 1 patch topically daily as needed (pain).    Marland Kitchen omeprazole (PRILOSEC) 20 MG capsule TAKE 1 CAPSULE BY MOUTH EVERY DAY 90 capsule 1  . prochlorperazine (COMPAZINE) 10 MG tablet Take 1 tablet (10 mg total) by mouth every 6 (six) hours as needed (Nausea or vomiting). 30 tablet 1  . sodium fluoride (FLUORISHIELD) 1.1 % GEL dental gel Instill one drop of gel into each tooth space of fluoride tray. Place over teeth for 5 minutes. Remove. Spit out excess. Repeat nightly 120 mL prn   No current facility-administered medications for this visit.     REVIEW OF SYSTEMS:    A 10+ POINT REVIEW OF SYSTEMS WAS OBTAINED including neurology, dermatology, psychiatry, cardiac, respiratory, lymph, extremities, GI, GU, Musculoskeletal, constitutional, breasts, reproductive, HEENT.  All pertinent positives are noted in the HPI.  All others are negative.   PHYSICAL EXAMINATION: ECOG PERFORMANCE STATUS: 1 - Symptomatic but completely ambulatory Vitals:   01/27/18 1442  BP: 122/80  Pulse: 73  Resp: 18  Temp: 98 F (36.7 C)  TempSrc: Oral  SpO2: 97%  Weight: 183 lb (83 kg)  Height: 5\' 9"  (1.753 m)  .  GENERAL:alert, in no acute distress and comfortable SKIN: no acute rashes, no significant lesions EYES: conjunctiva are pink and non-injected, sclera anicteric OROPHARYNX: MMM, no exudates, no  oropharyngeal erythema or ulceration NECK: supple, no JVD LYMPH:  no palpable lymphadenopathy in the cervical, axillary or inguinal regions LUNGS: clear to auscultation b/l with normal respiratory effort HEART: regular rate & rhythm ABDOMEN:  normoactive bowel sounds , non tender, not distended. No palpable hepatosplenomegaly.  Extremity: no pedal edema PSYCH: alert & oriented x 3 with fluent speech NEURO: no focal motor/sensory deficits   LABORATORY DATA:  I have reviewed the data as listed   CBC Latest Ref Rng & Units 01/27/2018 12/03/2017 10/13/2017  WBC 4.0 - 10.5 K/uL 5.7 5.2 4.8  Hemoglobin 13.0 - 17.0 g/dL 15.0 15.2 15.0  Hematocrit 39.0 - 52.0 % 44.1 44.2 44.1  Platelets 150 - 400 K/uL 122(L) 142(L) 150     . CMP Latest Ref Rng & Units 01/27/2018 12/03/2017 10/13/2017  Glucose 70 - 99 mg/dL 93 87 96  BUN 8 - 23 mg/dL 16 19 17   Creatinine 0.61 - 1.24 mg/dL 1.24 1.15 1.02  Sodium 135 - 145 mmol/L 137 137 138  Potassium 3.5 - 5.1 mmol/L 4.5 4.6 4.7  Chloride 98 - 111 mmol/L 104 98 98  CO2 22 - 32 mmol/L 26 23 22   Calcium 8.9 - 10.3 mg/dL 9.3 9.7 9.9  Total Protein 6.5 - 8.1 g/dL 6.9 - 6.7  Total Bilirubin 0.3 - 1.2 mg/dL 0.8 - 0.5  Alkaline Phos 38 - 126 U/L 121 - 125(H)  AST 15 - 41 U/L 15 - 14  ALT 0 - 44 U/L 10 - 15   PATHOLOGY:    RADIOGRAPHIC STUDIES: I have personally reviewed the radiological images as listed and agreed with the findings in the report.  Mr Neck Soft Tissue Only W Wo Contrast  Result Date: 01/13/2018 CLINICAL DATA:  Tonsil cancer with pain and swelling in the right tongue. Evaluate for recurrence EXAM: MRI OF THE NECK WITH CONTRAST TECHNIQUE: Multiplanar, multisequence MR imaging was performed following the administration of intravenous contrast. CONTRAST:  44mL MULTIHANCE GADOBENATE DIMEGLUMINE 529 MG/ML IV SOLN COMPARISON:  PET-CT 01/08/2018. FINDINGS: Pharynx and larynx: No persistent asymmetric signal, enhancement, or architecture to suggest  recurrent tumor. Activity on prior PET scan is likely muscular. There is submucosal low-density expansion correlating with prior treatment. Salivary glands: Post treatment atrophy Thyroid: Negative Lymph nodes: None enlarged or abnormally enhancing. Vascular: Unremarkable Limited intracranial: Negative Visualized orbits: Not covered Mastoids and visualized paranasal sinuses: Partial bilateral mastoid opacification, incidental. Minor mucosal thickening on the floor of the right maxillary sinus. Skeleton: Cervical disc degeneration. No evidence of metastatic disease or osteonecrosis. Upper chest: Negative Motion degraded. IMPRESSION: 1. No evident mass to explain PET findings, which may have been muscular. 2. Negative for nodal disease. Electronically Signed   By: Monte Fantasia M.D.   On: 01/13/2018 15:30    ASSESSMENT & PLAN:   67 y.o. 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 -creatinine WNL. Some concern for decreased hearing and baseline tinnitus due to previous job noise related injury. Baseline audiogram showed normal symmetric low-frequency hearing, dropping off substantially above 2000Hz .  s/p recent completion  of concurrent chemo-radiation with weekly Cisplatin, 12/30/2016 - 02/18/2017  He stopped tube feeding was 04/16/17. He is eating by mouth now.  04/30/16 PET which shows no enlarged or hypermetabolic activity in cervical lymph nodes and no residual primary focus of head and neck disease. Has new hypermetabolic nodule in upper lobe in left lung. This favors inflammatory process.  06/17/17 CT Chest shows stable left lung nodule, with no new developments, overall stable. Will continue to monitor closely.    09/09/17 CT Chest revealed Stable to decrease in size  irregular pulmonary nodule in the posterior left lung apex, hypermetabolic on PET-CT from 08/65/7846. Aortic Atherosclerosis  and Emphysema. Multi vessel coronary artery atherosclerotic calcifications.   Plan  -Discussed pt labwork today, 01/27/18; PLT slightly low at 122k but within range of patient's variation, other blood counts and chemistries are stable  -Discussed the 01/13/18 MRI Neck which revealed No evident mass to explain PET findings, which may have been muscular. 2. Negative for nodal disease. -Discussed the 12/08/17 PET/CT which revealed There is a new focus of abnormal asymmetric uptake within the right side of tongue within SUV max of 6.65. Suspicious for recurrence of disease versus new site of disease. 2. No significant FDG uptake associated with the irregular nodule within the left lung apex which may be postinflammatory in etiology -Discussed the 12/15/17 CT Chest which revealed Stable appearing left apical density, likely scar tissue given the surrounding changes. This was mildly hypermetabolic on the PET-CT from 04/29/2017 but not hypermetabolic on the more recent PET-CT from 12/08/2017. Recommend follow-up noncontrast chest CT in 6 months. 2. No enlarged mediastinal or hilar lymph nodes. 3. Stable advanced emphysematous changes and apical pulmonary scarring. No new/acute pulmonary findings. 4. Stable advanced atherosclerotic calcifications involving the thoracic aorta and branch vessels. -The pt shows no radiographic, clinical or lab progression/return of his tonsillar cancer at this time.  -No indication for further treatment at this time.   -The pt is maintaining follow up with ENT Dr. Erik Obey every 3 months -Will see the pt back in 4 months   -Would like repeat imaging every 6 months and clinical observation every 3 months by myself or ENT Dr. Erik Obey    RTC with Dr Irene Limbo with labs in 4 months   All of the patients questions were answered with apparent satisfaction. The  patient knows to call the clinic with any problems, questions or concerns.  The total time spent in the appt was 25 minutes and more than 50% was on counseling and direct patient cares.    Sullivan Lone MD MS AAHIVMS [facility] Hematology/Oncology Physician [facility]  (Office):       [phone] (Work cell):  [phone] (Fax):           [phone]  01/27/2018 3:23 PM  I, Baldwin Jamaica, am acting as a scribe for Dr. Sullivan Lone.   .I have reviewed the above documentation for accuracy and completeness, and I agree with the above. Brunetta Genera MD

## 2018-01-27 ENCOUNTER — Inpatient Hospital Stay (HOSPITAL_BASED_OUTPATIENT_CLINIC_OR_DEPARTMENT_OTHER): Payer: 59 | Admitting: Hematology

## 2018-01-27 ENCOUNTER — Inpatient Hospital Stay: Payer: 59 | Attending: Hematology

## 2018-01-27 VITALS — BP 122/80 | HR 73 | Temp 98.0°F | Resp 18 | Ht 69.0 in | Wt 183.0 lb

## 2018-01-27 DIAGNOSIS — R918 Other nonspecific abnormal finding of lung field: Secondary | ICD-10-CM | POA: Insufficient documentation

## 2018-01-27 DIAGNOSIS — Z923 Personal history of irradiation: Secondary | ICD-10-CM

## 2018-01-27 DIAGNOSIS — Z79899 Other long term (current) drug therapy: Secondary | ICD-10-CM | POA: Diagnosis not present

## 2018-01-27 DIAGNOSIS — Z7982 Long term (current) use of aspirin: Secondary | ICD-10-CM | POA: Diagnosis not present

## 2018-01-27 DIAGNOSIS — Z85818 Personal history of malignant neoplasm of other sites of lip, oral cavity, and pharynx: Secondary | ICD-10-CM | POA: Insufficient documentation

## 2018-01-27 DIAGNOSIS — C099 Malignant neoplasm of tonsil, unspecified: Secondary | ICD-10-CM

## 2018-01-27 DIAGNOSIS — Z9221 Personal history of antineoplastic chemotherapy: Secondary | ICD-10-CM | POA: Insufficient documentation

## 2018-01-27 DIAGNOSIS — Z87891 Personal history of nicotine dependence: Secondary | ICD-10-CM | POA: Insufficient documentation

## 2018-01-27 DIAGNOSIS — R911 Solitary pulmonary nodule: Secondary | ICD-10-CM

## 2018-01-27 LAB — CBC WITH DIFFERENTIAL/PLATELET
Abs Immature Granulocytes: 0 10*3/uL (ref 0.00–0.07)
BASOS ABS: 0 10*3/uL (ref 0.0–0.1)
BASOS PCT: 1 %
EOS ABS: 0.2 10*3/uL (ref 0.0–0.5)
Eosinophils Relative: 4 %
HCT: 44.1 % (ref 39.0–52.0)
Hemoglobin: 15 g/dL (ref 13.0–17.0)
IMMATURE GRANULOCYTES: 0 %
Lymphocytes Relative: 20 %
Lymphs Abs: 1.1 10*3/uL (ref 0.7–4.0)
MCH: 30.5 pg (ref 26.0–34.0)
MCHC: 34 g/dL (ref 30.0–36.0)
MCV: 89.8 fL (ref 80.0–100.0)
Monocytes Absolute: 0.6 10*3/uL (ref 0.1–1.0)
Monocytes Relative: 10 %
NEUTROS ABS: 3.7 10*3/uL (ref 1.7–7.7)
NRBC: 0 % (ref 0.0–0.2)
Neutrophils Relative %: 65 %
PLATELETS: 122 10*3/uL — AB (ref 150–400)
RBC: 4.91 MIL/uL (ref 4.22–5.81)
RDW: 12.4 % (ref 11.5–15.5)
WBC: 5.7 10*3/uL (ref 4.0–10.5)

## 2018-01-27 LAB — CMP (CANCER CENTER ONLY)
ALBUMIN: 4 g/dL (ref 3.5–5.0)
ALT: 10 U/L (ref 0–44)
AST: 15 U/L (ref 15–41)
Alkaline Phosphatase: 121 U/L (ref 38–126)
Anion gap: 7 (ref 5–15)
BUN: 16 mg/dL (ref 8–23)
CHLORIDE: 104 mmol/L (ref 98–111)
CO2: 26 mmol/L (ref 22–32)
Calcium: 9.3 mg/dL (ref 8.9–10.3)
Creatinine: 1.24 mg/dL (ref 0.61–1.24)
GFR, Est AFR Am: >60 mL/min (ref >60)
GFR, Estimated: 60 mL/min — ABNORMAL LOW (ref >60)
GLUCOSE: 93 mg/dL (ref 70–99)
POTASSIUM: 4.5 mmol/L (ref 3.5–5.1)
SODIUM: 137 mmol/L (ref 135–145)
Total Bilirubin: 0.8 mg/dL (ref 0.3–1.2)
Total Protein: 6.9 g/dL (ref 6.5–8.1)

## 2018-01-29 ENCOUNTER — Telehealth: Payer: Self-pay

## 2018-01-29 NOTE — Telephone Encounter (Signed)
Per 12/3 los. Spoke with patient wife concerning his upcoming appointment, Mailed a letter with a calender enclosed

## 2018-02-02 ENCOUNTER — Other Ambulatory Visit: Payer: Self-pay | Admitting: *Deleted

## 2018-02-02 DIAGNOSIS — R911 Solitary pulmonary nodule: Secondary | ICD-10-CM

## 2018-03-23 ENCOUNTER — Ambulatory Visit (INDEPENDENT_AMBULATORY_CARE_PROVIDER_SITE_OTHER): Payer: 59 | Admitting: Thoracic Surgery (Cardiothoracic Vascular Surgery)

## 2018-03-23 ENCOUNTER — Ambulatory Visit
Admission: RE | Admit: 2018-03-23 | Discharge: 2018-03-23 | Disposition: A | Discharge status: Home / Self Care | Payer: 59 | Source: Ambulatory Visit | Attending: Thoracic Surgery (Cardiothoracic Vascular Surgery) | Admitting: Thoracic Surgery (Cardiothoracic Vascular Surgery)

## 2018-03-23 ENCOUNTER — Encounter: Payer: Self-pay | Admitting: Thoracic Surgery (Cardiothoracic Vascular Surgery)

## 2018-03-23 ENCOUNTER — Other Ambulatory Visit: Payer: Self-pay

## 2018-03-23 VITALS — BP 124/82 | HR 64 | Resp 16 | Ht 69.0 in | Wt 187.4 lb

## 2018-03-23 DIAGNOSIS — J439 Emphysema, unspecified: Secondary | ICD-10-CM | POA: Diagnosis not present

## 2018-03-23 DIAGNOSIS — R911 Solitary pulmonary nodule: Secondary | ICD-10-CM | POA: Diagnosis not present

## 2018-03-23 DIAGNOSIS — D381 Neoplasm of uncertain behavior of trachea, bronchus and lung: Secondary | ICD-10-CM

## 2018-03-23 NOTE — Progress Notes (Signed)
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  About a year ago he was found to have a left upper lobe lung nodule.  That was found on a restaging PET/CT.  There was mild metabolic activity in the nodule.  He was not a candidate for percutaneous biopsy and would be low yield with navigational bronchoscopy so we opted for radiographic follow-up.  Nodule has remained stable in size since then.  A repeat PET/CT in October showed less metabolic activity in the nodule than previously.  In the interim since his last visit he has been feeling well.  He has not had any problems with shortness of breath or any unusual cough or hemoptysis.  He saw Dr. Erik Obey.  There were no findings to account for the metabolic activity in the throat on his PET/CT.  An MRI also showed no evidence of recurrent disease.  Past Medical History:  Diagnosis Date  . Aortic stenosis    Mild to moderate by echo 11/2016  . Arthritis   . Cancer ([place]) 10/2016   cancer of the tonsil/ 35 radiation treatments/4 doses of chemo/last radiatio 02/18/2017  . GERD (gastroesophageal reflux disease) 07/06/2014  . History of radiation therapy 12/30/2016- 02/18/2017   Right Tonsil and bilateral neck/ 70 Gy in 35 fractions to gross diseasae, 63 gy in 35 fractions to high risk nodal echelons, and 56 Gy in 35 fraction to intermediate risk nodal echelons.   . Hypertension   . Sleep apnea    does not use CPAP  . Wears glasses      Current Outpatient Medications  Medication Sig Dispense Refill  . acetaminophen (TYLENOL) 500 MG tablet Take 1,000 mg by mouth daily as needed for moderate pain or headache.    Marland Kitchen amLODipine (NORVASC) 10 MG tablet TAKE 1 TABLET EVERY DAY FOR FOR BLOOD PRESSURE **NEED OFFICE VISIT FOR MORE REFILLS 90 tablet 1  . aspirin EC 81 MG tablet Take  81 mg by mouth daily.    Marland Kitchen atorvastatin (LIPITOR) 40 MG tablet Take 1 tablet (40 mg total) by mouth daily. 90 tablet 1  . calcium carbonate (TUMS - DOSED IN MG ELEMENTAL CALCIUM) 500 MG chewable tablet Chew 2 tablets by mouth daily as needed for indigestion or heartburn.    . escitalopram (LEXAPRO) 20 MG tablet TAKE 1/2 TABLET DAILY (Patient taking differently: Take 10 mg by mouth daily. ) 45 tablet 3  . ezetimibe (ZETIA) 10 MG tablet Take 1 tablet (10 mg total) by mouth daily. 90 tablet 3  . fluticasone (FLONASE) 50 MCG/ACT nasal spray SPRAY 2 SPRAYS INTO EACH NOSTRIL EVERY DAY (Patient taking differently: Place 2 sprays into both nostrils daily as needed for allergies. ) 48 g 0  . lidocaine (XYLOCAINE) 2 % solution Mix 1 part 2%viscous lidocaine,1part H2O.Swish and swallow 12mL of this mixture, 18min before meals and at bedtime, up to QID (Patient taking differently: Mix 1 part 2%viscous lidocaine,1part H2O.Swish and swallow 81mL of this mixture, 19min before meals and at bedtime as needed for mouth pain) 100 mL 5  . Liniments (SALONPAS PAIN RELIEF PATCH EX) Apply 1 patch topically daily as needed (pain).    Marland Kitchen omeprazole (PRILOSEC) 20 MG capsule TAKE 1 CAPSULE BY  MOUTH EVERY DAY 90 capsule 1  . sodium fluoride (FLUORISHIELD) 1.1 % GEL dental gel Instill one drop of gel into each tooth space of fluoride tray. Place over teeth for 5 minutes. Remove. Spit out excess. Repeat nightly 120 mL prn   No current facility-administered medications for this visit.     Physical Exam BP 124/82 (BP Location: Left Arm, Patient Position: Sitting, Cuff Size: Large)   Pulse 64   Resp 16   Ht 5\' 9"  (1.753 m)   Wt 187 lb 6.4 oz (85 kg)   SpO2 94% Comment: RA  BMI 27.57 kg/m  68 year old man in no acute distress Alert and oriented x3 with no focal deficits Lungs diminished but equal breath sounds bilaterally Cardiac regular rate and rhythm with a 2/6 to 3/6 systolic murmur  Diagnostic Tests: CT CHEST WITHOUT  CONTRAST  TECHNIQUE: Multidetector CT imaging of the chest was performed following the standard protocol without IV contrast.  COMPARISON:  12/15/17  FINDINGS: Cardiovascular: Normal heart size. Aortic atherosclerosis. Calcifications within the RCA, LAD and left circumflex coronary arteries noted.  Mediastinum/Nodes: Normal appearance of the thyroid gland. The trachea appears patent and is midline. Small hiatal hernia. No mediastinal or hilar adenopathy.  Lungs/Pleura: No pleural effusion. There are moderate changes of centrilobular and paraseptal emphysema with diffuse bronchial wall thickening. Biapical pleuroparenchymal scarring is again noted. The left apical nodule which appears confluent with the scarring measures 1.2 x 1.1 cm, image 23/8. Unchanged from previous exam. New tiny nodule in the right lower lobe is nonspecific measuring 2 mm, image 103/8.  Upper Abdomen: No acute abnormality.  Musculoskeletal: Spondylosis within the thoracic spine. No aggressive lytic or sclerotic bone lesions. 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  IMPRESSION: 1. No change in the appearance of left apical nodular density which appears confluent with areas of apical pleuroparenchymal scarring and may be postinflammatory/infectious in etiology. 2. New tiny nodule within the right lower lobe measuring 2 mm is nonspecific. Attention on follow-up imaging is advised. 3. Aortic Atherosclerosis (ICD10-I70.0) and Emphysema (ICD10-J43.9). 4. Multi vessel coronary artery atherosclerotic calcifications.   Electronically Signed   By: Kerby Moors M.D.   On: 03/23/2018 10:53 I personally reviewed the CT images and concur with the findings noted above  Impression: Terry Pacheco is a 68 year old former smoker with a history of tonsillar cancer who was found to have a left apical lung nodule about a year ago.  This was 1.2 x 1.1 cm.  It was in an area of scarring.  It was  mildly metabolically active.  Due to difficulties with obtaining a biopsy we decided to follow that radiographically.  It has remained unchanged in size.  A follow-up PET/CT in October showed less metabolic activity than the one in March.  His CT today shows no change in size.  He does need continued follow-up to at least 2 to 3 years.  He will continue to get follow-up based on his tonsillar cancer.  He would like to consolidate his follow-up with Dr. Irene Limbo.  I think that would be a good idea.  He did have a new 2.5 mm right lower lobe lung nodule on his scan today.  That would need another scan in about a year.  He should be getting scans more frequently than that based on his cancer follow-up.  Tonsillar cancer-followed by Dr. Erik Obey, Dr.Kale and Dr. Isidore Moos  Plan: Follow-up with Dr. Irene Limbo.  I will be happy to see Terry Pacheco back if I  can be of any further assistance with his care  Melrose Nakayama, MD [facility] [phone]

## 2018-04-15 ENCOUNTER — Ambulatory Visit (INDEPENDENT_AMBULATORY_CARE_PROVIDER_SITE_OTHER): Payer: 59 | Admitting: Family Medicine

## 2018-04-15 ENCOUNTER — Encounter: Payer: Self-pay | Admitting: Family Medicine

## 2018-04-15 VITALS — BP 133/84 | HR 63 | Temp 97.8°F | Ht 69.0 in | Wt 186.1 lb

## 2018-04-15 DIAGNOSIS — E785 Hyperlipidemia, unspecified: Secondary | ICD-10-CM | POA: Diagnosis not present

## 2018-04-15 LAB — CMP14+EGFR
ALBUMIN: 4.5 g/dL (ref 3.8–4.8)
ALT: 16 IU/L (ref 0–44)
AST: 12 IU/L (ref 0–40)
Albumin/Globulin Ratio: 1.9 (ref 1.2–2.2)
Alkaline Phosphatase: 143 IU/L — ABNORMAL HIGH (ref 39–117)
BUN / CREAT RATIO: 17 (ref 10–24)
BUN: 20 mg/dL (ref 8–27)
Bilirubin Total: 0.4 mg/dL (ref 0.0–1.2)
CALCIUM: 9.5 mg/dL (ref 8.6–10.2)
CO2: 25 mmol/L (ref 20–29)
CREATININE: 1.19 mg/dL (ref 0.76–1.27)
Chloride: 97 mmol/L (ref 96–106)
GFR calc Af Amer: 73 mL/min/{1.73_m2} (ref >59)
GFR, EST NON AFRICAN AMERICAN: 63 mL/min/{1.73_m2} (ref >59)
GLOBULIN, TOTAL: 2.4 g/dL (ref 1.5–4.5)
GLUCOSE: 98 mg/dL (ref 65–99)
Potassium: 4.6 mmol/L (ref 3.5–5.2)
SODIUM: 135 mmol/L (ref 134–144)
TOTAL PROTEIN: 6.9 g/dL (ref 6.0–8.5)

## 2018-04-15 LAB — CBC WITH DIFFERENTIAL/PLATELET
BASOS ABS: 0 10*3/uL (ref 0.0–0.2)
Basos: 1 %
EOS (ABSOLUTE): 0.4 10*3/uL (ref 0.0–0.4)
Eos: 6 %
Hematocrit: 45.9 % (ref 37.5–51.0)
Hemoglobin: 15.7 g/dL (ref 13.0–17.7)
IMMATURE GRANS (ABS): 0 10*3/uL (ref 0.0–0.1)
Immature Granulocytes: 0 %
LYMPHS: 20 %
Lymphocytes Absolute: 1.2 10*3/uL (ref 0.7–3.1)
MCH: 31 pg (ref 26.6–33.0)
MCHC: 34.2 g/dL (ref 31.5–35.7)
MCV: 91 fL (ref 79–97)
Monocytes Absolute: 0.6 10*3/uL (ref 0.1–0.9)
Monocytes: 10 %
NEUTROS ABS: 3.8 10*3/uL (ref 1.4–7.0)
NEUTROS PCT: 63 %
Platelets: 158 10*3/uL (ref 150–450)
RBC: 5.06 x10E6/uL (ref 4.14–5.80)
RDW: 12.8 % (ref 11.6–15.4)
WBC: 6 10*3/uL (ref 3.4–10.8)

## 2018-04-15 LAB — LIPID PANEL
CHOL/HDL RATIO: 2.7 ratio (ref 0.0–5.0)
CHOLESTEROL TOTAL: 118 mg/dL (ref 100–199)
HDL: 44 mg/dL (ref >39)
LDL CALC: 59 mg/dL (ref 0–99)
Triglycerides: 75 mg/dL (ref 0–149)
VLDL CHOLESTEROL CAL: 15 mg/dL (ref 5–40)

## 2018-04-15 MED ORDER — ATORVASTATIN CALCIUM 40 MG PO TABS: 40.0000 mg | ORAL_TABLET | Freq: Every day | ORAL | 1 refills | Status: DC

## 2018-04-15 MED ORDER — AMLODIPINE BESYLATE 10 MG PO TABS: ORAL_TABLET | ORAL | 1 refills | Status: DC

## 2018-04-15 MED ORDER — OMEPRAZOLE 20 MG PO CPDR: DELAYED_RELEASE_CAPSULE | ORAL | 1 refills | Status: DC

## 2018-04-16 NOTE — Progress Notes (Signed)
Hello Ruffin,  Your lab result is normal.Some minor variations that are not significant are commonly marked abnormal, but do not represent any medical problem for you.  Best regards, Maize Brittingham, M.D.

## 2018-04-18 ENCOUNTER — Encounter: Payer: Self-pay | Admitting: Family Medicine

## 2018-04-18 NOTE — Progress Notes (Signed)
Subjective:  Patient ID: Terry Pacheco, male    DOB: 1950-04-14  Age: 68 y.o. MRN: [id number]  CC: Medical Management of Chronic Issues   HPI TREVEL DILLENBECK presents for follow-up of elevated cholesterol. Doing well without complaints on current medication. Denies side effects of statin including myalgia and arthralgia and nausea. Also in today for liver function testing. Currently no chest pain, shortness of breath or other cardiovascular related symptoms noted.   Follow-up of hypertension. Patient has no history of headache chest pain or shortness of breath or recent cough. Patient also denies symptoms of TIA such as numbness weakness lateralizing. Patient checks  blood pressure at home and has not had any elevated readings recently. Patient denies side effects from his medication. States taking it regularly.  Patient in for follow-up of GERD. Currently asymptomatic taking  PPI daily. There is no chest pain or heartburn. No hematemesis and no melena. No dysphagia or choking. Onset is remote. Progression is stable. Complicating factors, none.   History Maurice has a past medical history of Aortic stenosis, Arthritis, Cancer ([employer]) (10/2016), GERD (gastroesophageal reflux disease) (07/06/2014), History of radiation therapy (12/30/2016- 02/18/2017), Hypertension, Sleep apnea, and Wears glasses.   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   His family history includes Heart disease in his mother; Stroke in his father.He reports that he quit smoking about 17 months ago.  His smoking use included cigarettes. He started smoking about 35 years ago. He has a 22.50 pack-year smoking history. He has never used smokeless tobacco. He reports previous alcohol use. He reports current drug use. Drug: Marijuana.  Current Outpatient Medications on File Prior to Visit  Medication Sig Dispense Refill  . acetaminophen (TYLENOL) 500 MG tablet Take 1,000 mg by mouth daily as needed for moderate pain or headache.    Marland Kitchen aspirin EC 81 MG tablet Take 81 mg by mouth daily.    . calcium carbonate (TUMS - DOSED IN MG ELEMENTAL CALCIUM) 500 MG chewable tablet Chew 2 tablets by mouth daily as needed for indigestion or heartburn.    . escitalopram (LEXAPRO) 20 MG tablet TAKE 1/2 TABLET DAILY (Patient taking differently: Take 10 mg by mouth daily. ) 45 tablet 3  . fluticasone (FLONASE) 50 MCG/ACT nasal spray SPRAY 2 SPRAYS INTO EACH NOSTRIL EVERY DAY (Patient taking differently: Place 2 sprays into both nostrils daily as needed for allergies. ) 48 g 0  . lidocaine (XYLOCAINE) 2 % solution Mix 1 part 2%viscous lidocaine,1part H2O.Swish and swallow 32m of this mixture, 386m before meals and at bedtime, up to QID (Patient taking differently: Mix 1 part 2%viscous lidocaine,1part H2O.Swish and swallow 1067mf this mixture, 57m17mefore meals and at bedtime as needed for mouth pain) 100 mL 5  . Liniments (SALONPAS PAIN RELIEF PATCH EX) Apply 1 patch topically daily as needed (pain).    . sodium fluoride (FLUORISHIELD) 1.1 % GEL dental gel Instill one drop of gel into each tooth space of fluoride tray. Place over teeth for 5 minutes. Remove. Spit out excess. Repeat nightly 120 mL prn  . ezetimibe (ZETIA) 10 MG tablet Take 1  tablet (10 mg total) by mouth daily. 90 tablet 3   No current facility-administered medications on file prior to visit.     ROS Review of Systems  Constitutional: Negative.   HENT: Negative.   Eyes: Negative for visual disturbance.  Respiratory: Negative for cough and  shortness of breath.   Cardiovascular: Negative for chest pain and leg swelling.  Gastrointestinal: Negative for abdominal pain, diarrhea, nausea and vomiting.  Genitourinary: Negative for difficulty urinating.  Musculoskeletal: Negative for arthralgias and myalgias.  Skin: Negative for rash.  Neurological: Negative for headaches.  Psychiatric/Behavioral: Negative for sleep disturbance.    Objective:  BP 133/84   Pulse 63   Temp 97.8 F (36.6 C) (Oral)   Ht 5' 9" (1.753 m)   Wt 186 lb 2 oz (84.4 kg)   BMI 27.49 kg/m   BP Readings from Last 3 Encounters:  04/15/18 133/84  03/23/18 124/82  01/27/18 122/80    Wt Readings from Last 3 Encounters:  04/15/18 186 lb 2 oz (84.4 kg)  03/23/18 187 lb 6.4 oz (85 kg)  01/27/18 183 lb (83 kg)     Physical Exam Constitutional:      General: He is not in acute distress.    Appearance: He is well-developed.  HENT:     Head: Normocephalic and atraumatic.     Right Ear: External ear normal.     Left Ear: External ear normal.     Nose: Nose normal.  Eyes:     Conjunctiva/sclera: Conjunctivae normal.     Pupils: Pupils are equal, round, and reactive to light.  Neck:     Musculoskeletal: Normal range of motion and neck supple.  Cardiovascular:     Rate and Rhythm: Normal rate and regular rhythm.     Heart sounds: Normal heart sounds. No murmur.  Pulmonary:     Effort: Pulmonary effort is normal. No respiratory distress.     Breath sounds: Normal breath sounds. No wheezing or rales.  Abdominal:     Palpations: Abdomen is soft.     Tenderness: There is no abdominal tenderness.  Musculoskeletal: Normal range of motion.  Skin:    General: Skin is warm and dry.  Neurological:     Mental Status: He is alert and oriented to person, place, and time.     Deep Tendon Reflexes: Reflexes are normal and symmetric.  Psychiatric:        Behavior: Behavior normal.        Thought Content: Thought content normal.        Judgment: Judgment  normal.     Lab Results  Component Value Date   HGBA1C 5.2 02/08/2013    Lab Results  Component Value Date   WBC 6.0 04/15/2018   HGB 15.7 04/15/2018   HCT 45.9 04/15/2018   PLT 158 04/15/2018   GLUCOSE 98 04/15/2018   CHOL 118 04/15/2018   TRIG 75 04/15/2018   HDL 44 04/15/2018   LDLCALC 59 04/15/2018   ALT 16 04/15/2018   AST 12 04/15/2018   NA 135 04/15/2018   K 4.6 04/15/2018   CL 97 04/15/2018   CREATININE 1.19 04/15/2018   BUN 20 04/15/2018   CO2 25 04/15/2018   TSH 0.673 06/05/2017   INR 1.01 03/06/2017   HGBA1C 5.2 02/08/2013    Ct Chest Wo Contrast  Result Date: 03/23/2018 CLINICAL DATA:  Solitary pulmonary nodule follow-up. EXAM: CT CHEST WITHOUT CONTRAST TECHNIQUE: Multidetector CT imaging of the chest was performed following the standard  protocol without IV contrast. COMPARISON:  12/15/17 FINDINGS: Cardiovascular: Normal heart size. Aortic atherosclerosis. Calcifications within the RCA, LAD and left circumflex coronary arteries noted. Mediastinum/Nodes: Normal appearance of the thyroid gland. The trachea appears patent and is midline. Small hiatal hernia. No mediastinal or hilar adenopathy. Lungs/Pleura: No pleural effusion. There are moderate changes of centrilobular and paraseptal emphysema with diffuse bronchial wall thickening. Biapical pleuroparenchymal scarring is again noted. The left apical nodule which appears confluent with the scarring measures 1.2 x 1.1 cm, image 23/8. Unchanged from previous exam. New tiny nodule in the right lower lobe is nonspecific measuring 2 mm, image 103/8. Upper Abdomen: No acute abnormality. Musculoskeletal: Spondylosis within the thoracic spine. No aggressive lytic or sclerotic bone lesions. 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 IMPRESSION: 1. No change in the appearance of left apical nodular density which appears confluent with areas of apical pleuroparenchymal scarring and may be postinflammatory/infectious in  etiology. 2. New tiny nodule within the right lower lobe measuring 2 mm is nonspecific. Attention on follow-up imaging is advised. 3. Aortic Atherosclerosis (ICD10-I70.0) and Emphysema (ICD10-J43.9). 4. Multi vessel coronary artery atherosclerotic calcifications. Electronically Signed   By: Kerby Moors M.D.   On: 03/23/2018 10:53    Assessment & Plan:   Maijor was seen today for medical management of chronic issues.  Diagnoses and all orders for this visit:  Hyperlipidemia with target LDL less than 100 -     CBC with Differential/Platelet -     CMP14+EGFR -     Lipid panel  Other orders -     amLODipine (NORVASC) 10 MG tablet; TAKE 1 TABLET EVERY DAY FOR FOR BLOOD PRESSURE -     atorvastatin (LIPITOR) 40 MG tablet; Take 1 tablet (40 mg total) by mouth daily. -     omeprazole (PRILOSEC) 20 MG capsule; TAKE 1 CAPSULE BY MOUTH EVERY DAY   I have changed Davonne R. Mallin's amLODipine. I am also having him maintain his sodium fluoride, lidocaine, fluticasone, escitalopram, aspirin EC, acetaminophen, calcium carbonate, Liniments (SALONPAS PAIN RELIEF PATCH EX), ezetimibe, atorvastatin, and omeprazole.  Meds ordered this encounter  Medications  . amLODipine (NORVASC) 10 MG tablet    Sig: TAKE 1 TABLET EVERY DAY FOR FOR BLOOD PRESSURE    Dispense:  90 tablet    Refill:  1  . atorvastatin (LIPITOR) 40 MG tablet    Sig: Take 1 tablet (40 mg total) by mouth daily.    Dispense:  90 tablet    Refill:  1  . omeprazole (PRILOSEC) 20 MG capsule    Sig: TAKE 1 CAPSULE BY MOUTH EVERY DAY    Dispense:  90 capsule    Refill:  1     Follow-up: Return in about 6 months (around 10/14/2018).  Claretta Fraise, M.D.

## 2018-05-24 ENCOUNTER — Telehealth: Payer: Self-pay | Admitting: *Deleted

## 2018-05-24 NOTE — Telephone Encounter (Signed)
Received call asking if patient could reschedule one year f/u appts currently on  4/1. Per Dr. Irene Limbo, he can reschedule for 2 months from now. Patient's wife stated he was doing well and verbalized understanding that scheduling will contact them. Encouraged to contact office for questions or concerns.

## 2018-05-25 ENCOUNTER — Telehealth: Payer: Self-pay | Admitting: Hematology

## 2018-05-25 NOTE — Telephone Encounter (Signed)
Scheduled appt per 3/30 sch message - pt wife is aware of appt date and time

## 2018-05-26 ENCOUNTER — Inpatient Hospital Stay: Payer: 59 | Admitting: Hematology

## 2018-05-26 ENCOUNTER — Inpatient Hospital Stay: Payer: 59

## 2018-07-05 ENCOUNTER — Telehealth: Payer: Self-pay | Admitting: *Deleted

## 2018-07-05 NOTE — Telephone Encounter (Signed)
Wife called - patient experiencing swelling in tongue and throat. Has appt June 1st, but does not want to wait to be seen. (Rescheduled appt for 4/1 due to [place]). Notified wife that scheduling msg sent. Wife wants to be called during appt @ [phone]

## 2018-07-08 NOTE — Progress Notes (Signed)
HEMATOLOGY/ONCOLOGY FOLLOW UP VISIT NOTE  Date of Service: 07/09/2018  Patient Care Team: Claretta Fraise, MD as PCP - General (Family Medicine) Jodi Marble, MD as Consulting Physician (Otolaryngology) Eppie Gibson, MD as Attending Physician (Radiation Oncology) Leota Sauers, RN as Oncology Nurse Navigator (Oncology) Karie Mainland, RD as Dietitian (Nutrition) Jomarie Longs, PT (Inactive) as Physical Therapist (Physical Therapy) Sharen Counter, CCC-SLP as Speech Language Pathologist (Speech Pathology) Kennith Center, LCSW as Social Worker  CHIEF COMPLAINTS/PURPOSE OF CONSULTATION:  F/u for tonsillar carcinoma Pulmonary nodule  HISTORY OF PRESENTING ILLNESS:   Terry Pacheco is a wonderful 68 y.o. male who has been referred to [place] by ENT Specialist, Dr. Erik Obey for evaluation and management of invasive squamous cell carcinoma of tonsil.   He has a PMHx of GERD, HTN, High cholesterol which he takes medications for. He denies PMHx of seizures. He is accompanied by his wife and sister today. He reports that he is doing well overall. The pt initially presented with sudden-onset right lateral tongue numbness that has been ongoing for approximately 1 year. Subsequently, he had left sided gum swelling and intermittent sore throat symptoms. He notes no stroke-like symptoms. He notes that following these symptoms, he was evaluated by multiple providers including an ENT specialist and a dentist prior to meeting with Dr. Erik Obey. Pt wife noted swelling and discoloration inside his mouth prior to the patient being evaluated by Dr. Erik Obey. At his first visit with Dr. Erik Obey, he noted an abnormality to his left tonsil and completed a biopsy that same day.   He has had CT soft tissue neck with contrast on 11/18/2016 with results showing: Fullness of the right palatine tonsil. Surrounding fat planes are ill-defined. This area is obscured by dental artifact. Possible right tonsillar  carcinoma.   The patient has had biopsy completed on 11/19/2016 with results of: "RIGHT FAUCIAL TONSIL", BIOPSY: Squamous cell carcinoma, suspicious for superficial invasion in this material. Immunostain p16 positive.   He also had a PET scan completed on 12/02/2016 with results revealing: IMPRESSION: 1. Mildly asymmetric tonsillar activity along the right tonsillar sinus, maximum SUV 7.7 as compared to the left side 5.2, suspicious in this setting for right-sided malignancy. There is also a mildly enlarged and hypermetabolic right level IIa lymph node with maximum SUV of 8.4. No evidence of other metastatic spread. 2. Faint accentuation of activity in the left lateral prostate gland apex, but low-grade. Correlate with PSA level in determining whether further workup is warranted. 3. Other imaging findings of potential clinical significance: Aortic Atherosclerosis (ICD10-I70.0) and Emphysema (ICD10-J43.9). Coronary atherosclerosis. Small type 1 hiatal hernia. Bilateral renal cysts. Colonic diverticulosis. Lastly, he had a MR Face trigeminal on 12/09/2016 with results of IMPRESSION: 1. No evidence of perineural spread of malignancy. 2. Mild asymmetry of the palatine tonsils without discrete mass. Correlate with direct visualization.   As far as surgeries, he has had a polyp on left sided throat that was removed 3-4 years ago that resulted negative for malignancy. He also has had a right hip replacement. Lastly, he has had a left inguinal hernia operation in 1978. He started smoking cigarettes at age 71 and would smoke 1 PPD while working. When he retired several years ago he smoked 0.5 PPD and has quit smoking cigarettes 1 month ago since his diagnosis. He states that he used a vape after quitting but was advised to quit via Dr. Isidore Moos. He occasionally consumes ETOH. 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 Denies allergies to medications at this time. He reports that  his father died of a stroke at age 62 and his mother died from cardiac issues. Mother had gastric cancer and was diagnosed in late 16's. Denies any other cancers of blood disorders in the family.    On review of systems, he denies hematuria, dysuria, or urinary retention. He reports urinary frequency over the past year. He reports bowel incontinence following a bowel movement x 1 year that has gradually improved more recently. He notes that his bowel incontinence has been intermittent. He has well formed stools without blood in stools or rectal bleeding. He denies increase in bowel movements and he has up to 1 bowel movement daily. He has had 2-3 occurrences of hemorrhoids with no recent flares. He denies mucous in his stools. He denies injuries or surgeries to his rectal area. He denies bladder incontinence. He has lower back pain that has been chronic and unchanged. He has a prior hx of diverticulitis approximately 10 years ago that was followed with colonoscopy and was then diagnosed with diverticulosis. He denies headache at this time. He is able to ambulate for prolonged periods without dyspnea on exertion. 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 He has had an audiogram completed by an ENT specialist in [place], [place]:  Surveillance   INTERIM HISTORY:    Terry Pacheco presents today for management and evaluation of his tonsillar cancer and pulmonary nodule. The patient's last visit with [place] was on 01/27/18. The pt reports that he is doing well overall.  The pt reports that he has been experiencing some sensations of both numbness and tenderness along his inner right lower gum and the bottom right side of his tongue, which began about 1-1.5 months ago. He notes this numbness then occurred in his upper right gum. He notes his wife looked at his inner gums on the right side of his mouth and felt there was some redness and possible  swelling. He denies eating anything that irritated his mouth. He endorses some increased perception of saltiness. He denies dental pain as such. He endorses some sensitivity on the outside of his jaw as well. He feels that the symptoms have been stable since they initially began 4-6 weeks ago.  He has not seen Dr. Erik Obey since this development, and is not sure when he will return to see his ENT. He denies changes in breathing or changes in his energy levels. He notes that he is staying active and is eating very well. He denies abdominal pains or leg swelling.  Of note since the patient's last visit, pt has had a CT Chest completed on 03/23/18 with results revealing "No change in the appearance of left apical nodular density which appears confluent with areas of apical pleuroparenchymal scarring and may be postinflammatory/infectious in etiology. 2. New tiny nodule within the right lower lobe measuring 2 mm is nonspecific. Attention on follow-up imaging is advised. 3. Aortic Atherosclerosis and Emphysema 4. Multi vessel coronary artery atherosclerotic calcifications".  Lab results today (07/09/18) of CBC w/diff and CMP is as follows: all values are WNL except for PLT at 130k, Glucose at 101.  On review of systems, pt reports good energy levels, staying active, eating very well, right inner gum and right sided tongue sensitivity and numbness, and denies abdominal pains, leg swelling, fevers, chills, night sweats, dental  pain, pain along the spine, back pain, changes in breathing, concerns for infections, and any other symptoms.   MEDICAL HISTORY:  Past Medical History:  Diagnosis Date   Aortic stenosis    Mild to moderate by echo 11/2016   Arthritis    Cancer ([place]) 10/2016   cancer of the tonsil/ 35 radiation treatments/4 doses of chemo/last radiatio 02/18/2017   GERD (gastroesophageal reflux disease) 07/06/2014   History of radiation therapy 12/30/2016- 02/18/2017   Right Tonsil and bilateral  neck/ 70 Gy in 35 fractions to gross diseasae, 63 gy in 35 fractions to high risk nodal echelons, and 56 Gy in 35 fraction to intermediate risk nodal echelons.    Hypertension    Sleep apnea    does not use CPAP   Wears glasses     SURGICAL HISTORY: Past Surgical History:  Procedure Laterality Date   HERNIA REPAIR     right inguinal hernia   IR CV LINE INJECTION  01/14/2017   IR FLUORO GUIDE PORT INSERTION RIGHT  12/25/2016   IR GASTROSTOMY TUBE MOD SED  12/25/2016   IR GASTROSTOMY TUBE REMOVAL  06/05/2017   IR REMOVAL TUN ACCESS W/ PORT W/O FL MOD SED  03/06/2017   IR US GUIDE VASC ACCESS RIGHT  12/25/2016   LEFT HEART CATH AND CORONARY ANGIOGRAPHY N/A 12/07/2017   Procedure: LEFT HEART CATH AND CORONARY ANGIOGRAPHY;  Surgeon: Belva Crome, MD;  Location: [facility];  Service: Cardiovascular;  Laterality: N/A;   polyp removal  2008   during colonoscopy   TOTAL HIP ARTHROPLASTY Right 10/16/2015   Procedure: RIGHT TOTAL HIP ARTHROPLASTY ANTERIOR APPROACH;  Surgeon: Paralee Cancel, MD;  Location: [facility];  Service: Orthopedics;  Laterality: Right;   vocal cord polyp      removed 2-3 years ago    SOCIAL HISTORY: Social History   Socioeconomic History   Marital status: Married    Spouse name: Not on file   Number of children: 1   Years of education: Not on file   Highest education level: Not on file  Occupational History   Occupation: Retired     Fish farm manager: [employer]  Social Needs   Financial resource strain: Not on file   Food insecurity:    Worry: Not on file    Inability: Not on file   Transportation needs:    Medical: Not on file    Non-medical: Not on file  Tobacco Use   Smoking status: Former Smoker    Packs/day: 0.50    Years: 45.00    Pack years: 22.50    Types: Cigarettes    Start date: 01/12/1983    Last attempt to quit: 11/17/2016    Years since quitting: 1.6   Smokeless tobacco: Never Used  Substance and Sexual Activity     Alcohol use: Not Currently    Alcohol/week: 0.0 standard drinks   Drug use: Yes    Types: Marijuana    Comment: last use 10/09/2015   Sexual activity: Not on file  Lifestyle   Physical activity:    Days per week: Not on file    Minutes per session: Not on file   Stress: Not on file  Relationships   Social connections:    Talks on phone: Not on file    Gets together: Not on file    Attends religious service: Not on file    Active member of club or organization: Not on file    Attends meetings of  clubs or organizations: Not on file    Relationship status: Not on file   Intimate partner violence:    Fear of current or ex partner: Not on file    Emotionally abused: Not on file    Physically abused: Not on file    Forced sexual activity: Not on file  Other Topics Concern   Not on file  Social History Narrative   Not on file    FAMILY HISTORY: Family History  Problem Relation Age of Onset   Heart disease Mother    Stroke Father     ALLERGIES:  is allergic to crestor [rosuvastatin].  MEDICATIONS:  Current Outpatient Medications  Medication Sig Dispense Refill   acetaminophen (TYLENOL) 500 MG tablet Take 1,000 mg by mouth daily as needed for moderate pain or headache.     amLODipine (NORVASC) 10 MG tablet TAKE 1 TABLET EVERY DAY FOR FOR BLOOD PRESSURE 90 tablet 1   aspirin EC 81 MG tablet Take 81 mg by mouth daily.     atorvastatin (LIPITOR) 40 MG tablet Take 1 tablet (40 mg total) by mouth daily. 90 tablet 1   calcium carbonate (TUMS - DOSED IN MG ELEMENTAL CALCIUM) 500 MG chewable tablet Chew 2 tablets by mouth daily as needed for indigestion or heartburn.     escitalopram (LEXAPRO) 20 MG tablet TAKE 1/2 TABLET DAILY (Patient taking differently: Take 10 mg by mouth daily. ) 45 tablet 3   ezetimibe (ZETIA) 10 MG tablet Take 1 tablet (10 mg total) by mouth daily. 90 tablet 3   fluticasone (FLONASE) 50 MCG/ACT nasal spray SPRAY 2 SPRAYS INTO EACH NOSTRIL  EVERY DAY (Patient taking differently: Place 2 sprays into both nostrils daily as needed for allergies. ) 48 g 0   lidocaine (XYLOCAINE) 2 % solution Mix 1 part 2%viscous lidocaine,1part H2O.Swish and swallow 75mL of this mixture, 28min before meals and at bedtime, up to QID (Patient taking differently: Mix 1 part 2%viscous lidocaine,1part H2O.Swish and swallow 42mL of this mixture, 24min before meals and at bedtime as needed for mouth pain) 100 mL 5   Liniments (SALONPAS PAIN RELIEF PATCH EX) Apply 1 patch topically daily as needed (pain).     nystatin (MYCOSTATIN) 100000 UNIT/ML suspension Take 5 mLs (500,000 Units total) by mouth 4 (four) times daily for 10 days. Swish in mouth for as long as possible then swallow. 200 mL 0   omeprazole (PRILOSEC) 20 MG capsule TAKE 1 CAPSULE BY MOUTH EVERY DAY 90 capsule 1   sodium fluoride (FLUORISHIELD) 1.1 % GEL dental gel Instill one drop of gel into each tooth space of fluoride tray. Place over teeth for 5 minutes. Remove. Spit out excess. Repeat nightly 120 mL prn   No current facility-administered medications for this visit.     REVIEW OF SYSTEMS:    A 10+ POINT REVIEW OF SYSTEMS WAS OBTAINED including neurology, dermatology, psychiatry, cardiac, respiratory, lymph, extremities, GI, GU, Musculoskeletal, constitutional, breasts, reproductive, HEENT.  All pertinent positives are noted in the HPI.  All others are negative.   PHYSICAL EXAMINATION: ECOG PERFORMANCE STATUS: 1 - Symptomatic but completely ambulatory Vitals:   07/09/18 0853  BP: 135/73  Pulse: 62  Resp: 17  Temp: 97.9 F (36.6 C)  TempSrc: Oral  SpO2: 98%  Weight: 192 lb 1.6 oz (87.1 kg)  Height: 5\' 9"  (1.753 m)  .  GENERAL:alert, in no acute distress and comfortable SKIN: no acute rashes, no significant lesions EYES: conjunctiva are pink and non-injected, sclera anicteric OROPHARYNX:  bogginess in right tonsillar fossa without palpable lymph nodes NECK: supple, no  JVD LYMPH:  no palpable lymphadenopathy in the cervical, axillary or inguinal regions LUNGS: clear to auscultation b/l with normal respiratory effort HEART: regular rate & rhythm ABDOMEN:  normoactive bowel sounds , non tender, not distended. No palpable hepatosplenomegaly.  Extremity: no pedal edema PSYCH: alert & oriented x 3 with fluent speech NEURO: no focal motor/sensory deficits   LABORATORY DATA:  I have reviewed the data as listed   CBC Latest Ref Rng & Units 07/09/2018 04/15/2018 01/27/2018  WBC 4.0 - 10.5 K/uL 6.0 6.0 5.7  Hemoglobin 13.0 - 17.0 g/dL 14.4 15.7 15.0  Hematocrit 39.0 - 52.0 % 43.6 45.9 44.1  Platelets 150 - 400 K/uL 130(L) 158 122(L)     . CMP Latest Ref Rng & Units 07/09/2018 04/15/2018 01/27/2018  Glucose 70 - 99 mg/dL 101(H) 98 93  BUN 8 - 23 mg/dL 20 20 16   Creatinine 0.61 - 1.24 mg/dL 1.03 1.19 1.24  Sodium 135 - 145 mmol/L 135 135 137  Potassium 3.5 - 5.1 mmol/L 4.5 4.6 4.5  Chloride 98 - 111 mmol/L 104 97 104  CO2 22 - 32 mmol/L 23 25 26   Calcium 8.9 - 10.3 mg/dL 8.9 9.5 9.3  Total Protein 6.5 - 8.1 g/dL 6.9 6.9 6.9  Total Bilirubin 0.3 - 1.2 mg/dL 0.6 0.4 0.8  Alkaline Phos 38 - 126 U/L 122 143(H) 121  AST 15 - 41 U/L 18 12 15   ALT 0 - 44 U/L 17 16 10    PATHOLOGY:    RADIOGRAPHIC STUDIES: I have personally reviewed the radiological images as listed and agreed with the findings in the report.  No results found.  ASSESSMENT & PLAN:   68 y.o. 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 -creatinine WNL. Some concern for decreased hearing and baseline tinnitus due to previous job noise related injury. Baseline audiogram showed normal symmetric low-frequency hearing, dropping off substantially above 2000Hz .  s/p  recent completion of concurrent chemo-radiation with weekly Cisplatin, 12/30/2016 - 02/18/2017  He stopped tube feeding 04/16/17. He is eating by mouth now.  04/30/16 PET which shows no enlarged or hypermetabolic activity in cervical lymph nodes and no residual primary focus of head and neck disease. Has new hypermetabolic nodule in upper lobe in left lung. This favors inflammatory process.  12/08/17 PET/CT revealed There is a new focus of abnormal asymmetric uptake within the right side of tongue within SUV max of 6.65. Suspicious for recurrence of disease versus new site of disease. 2. No significant FDG uptake associated with the irregular nodule within the left lung apex which may be postinflammatory in etiology  01/13/18 MRI Neck revealed No evident mass to explain PET findings, which may have been muscular. 2. Negative for nodal disease.  2. Pulmonary nodule  Initially seen on 04/30/16 PET/CT  06/17/17 CT Chest shows stable left lung nodule, with no new developments, overall stable. Will continue to monitor closely.    09/09/17 CT Chest revealed Stable to decrease in size irregular pulmonary nodule in the posterior left lung apex, hypermetabolic on PET-CT from 62/22/9798. Aortic Atherosclerosis  and Emphysema. Multi vessel coronary artery atherosclerotic calcifications.   12/15/17 CT Chest revealed Stable appearing left apical density, likely scar  tissue given the surrounding changes. This was mildly hypermetabolic on the PET-CT from 04/29/2017 but not hypermetabolic on the more recent PET-CT from 12/08/2017. Recommend follow-up noncontrast chest CT in 6 months. 2. No enlarged mediastinal or hilar lymph nodes. 3. Stable advanced emphysematous changes and apical pulmonary scarring. No new/acute pulmonary findings. 4. Stable advanced atherosclerotic calcifications involving the thoracic aorta and branch vessels.  Plan  -Discussed pt labwork today, 07/09/18; PLT borderline at 130k, other blood counts  and chemistries are otherwise normal -Discussed the 03/23/18 CT Chest which revealed "No change in the appearance of left apical nodular density which appears confluent with areas of apical pleuroparenchymal scarring and may be postinflammatory/infectious in etiology. 2. New tiny nodule within the right lower lobe measuring 2 mm is nonspecific. Attention on follow-up imaging is advised. 3. Aortic Atherosclerosis and Emphysema 4. Multi vessel coronary artery atherosclerotic calcifications". -Inflammation/edema under mucosa in lower right jaw seen on last MRI, indicating there could be variation in symptomatology  -Given patient's symptoms, would like to repeat a CT Neck soft tissue -Empiric Nystatin as well -Recommend returning to care with ENT Dr. Erik Obey, coordinate with head and neck nurse navigator Gayleen Orem, within the next month for clinical evaluation -The pt shows no radiographic, clinical or lab progression/return of his tonsillar cancer at this time.  -No indication for further treatment at this time. -Would like repeat imaging every 6 months and clinical observation every 3 months by myself or ENT Dr. Erik Obey  -Will speak with the pt in 10 days with phone visit   CT neck soft tissue in 5-7 days Telephone visit with Dr Irene Limbo in 10 days' F/u with Dr Archie Patten- patient will call to setup followup   All of the patients questions were answered with apparent satisfaction. The patient knows to call the clinic with any problems, questions or concerns.  The total time spent in the appt was 20 minutes and more than 50% was on counseling and direct patient cares.    Sullivan Lone MD MS AAHIVMS [facility] Hematology/Oncology Physician [facility]  (Office):       [phone] (Work cell):  [phone] (Fax):           [phone]  07/09/2018 9:44 AM  I, Baldwin Jamaica, am acting as a scribe for Dr. Sullivan Lone.   .I have reviewed the above documentation for accuracy and  completeness, and I agree with the above. Brunetta Genera MD

## 2018-07-09 ENCOUNTER — Inpatient Hospital Stay: Payer: 59 | Attending: Hematology

## 2018-07-09 ENCOUNTER — Inpatient Hospital Stay (HOSPITAL_BASED_OUTPATIENT_CLINIC_OR_DEPARTMENT_OTHER): Payer: 59 | Admitting: Hematology

## 2018-07-09 ENCOUNTER — Telehealth: Payer: Self-pay | Admitting: Hematology

## 2018-07-09 ENCOUNTER — Other Ambulatory Visit: Payer: Self-pay

## 2018-07-09 VITALS — BP 135/73 | HR 62 | Temp 97.9°F | Resp 17 | Ht 69.0 in | Wt 192.1 lb

## 2018-07-09 DIAGNOSIS — I10 Essential (primary) hypertension: Secondary | ICD-10-CM | POA: Insufficient documentation

## 2018-07-09 DIAGNOSIS — Z9221 Personal history of antineoplastic chemotherapy: Secondary | ICD-10-CM | POA: Diagnosis not present

## 2018-07-09 DIAGNOSIS — J439 Emphysema, unspecified: Secondary | ICD-10-CM

## 2018-07-09 DIAGNOSIS — Z923 Personal history of irradiation: Secondary | ICD-10-CM | POA: Insufficient documentation

## 2018-07-09 DIAGNOSIS — I700 Atherosclerosis of aorta: Secondary | ICD-10-CM

## 2018-07-09 DIAGNOSIS — C099 Malignant neoplasm of tonsil, unspecified: Secondary | ICD-10-CM

## 2018-07-09 DIAGNOSIS — Z87891 Personal history of nicotine dependence: Secondary | ICD-10-CM

## 2018-07-09 DIAGNOSIS — Z85818 Personal history of malignant neoplasm of other sites of lip, oral cavity, and pharynx: Secondary | ICD-10-CM | POA: Insufficient documentation

## 2018-07-09 DIAGNOSIS — E7800 Pure hypercholesterolemia, unspecified: Secondary | ICD-10-CM | POA: Diagnosis not present

## 2018-07-09 DIAGNOSIS — C090 Malignant neoplasm of tonsillar fossa: Secondary | ICD-10-CM

## 2018-07-09 DIAGNOSIS — R911 Solitary pulmonary nodule: Secondary | ICD-10-CM | POA: Diagnosis not present

## 2018-07-09 LAB — CBC WITH DIFFERENTIAL/PLATELET
Abs Immature Granulocytes: 0 10*3/uL (ref 0.00–0.07)
Basophils Absolute: 0 10*3/uL (ref 0.0–0.1)
Basophils Relative: 1 %
Eosinophils Absolute: 0.3 10*3/uL (ref 0.0–0.5)
Eosinophils Relative: 6 %
HCT: 43.6 % (ref 39.0–52.0)
Hemoglobin: 14.4 g/dL (ref 13.0–17.0)
Immature Granulocytes: 0 %
Lymphocytes Relative: 26 %
Lymphs Abs: 1.5 10*3/uL (ref 0.7–4.0)
MCH: 29.9 pg (ref 26.0–34.0)
MCHC: 33 g/dL (ref 30.0–36.0)
MCV: 90.6 fL (ref 80.0–100.0)
Monocytes Absolute: 0.7 10*3/uL (ref 0.1–1.0)
Monocytes Relative: 12 %
Neutro Abs: 3.4 10*3/uL (ref 1.7–7.7)
Neutrophils Relative %: 55 %
Platelets: 130 10*3/uL — ABNORMAL LOW (ref 150–400)
RBC: 4.81 MIL/uL (ref 4.22–5.81)
RDW: 12.6 % (ref 11.5–15.5)
WBC: 6 10*3/uL (ref 4.0–10.5)
nRBC: 0 % (ref 0.0–0.2)

## 2018-07-09 LAB — CMP (CANCER CENTER ONLY)
ALT: 17 U/L (ref 0–44)
AST: 18 U/L (ref 15–41)
Albumin: 3.9 g/dL (ref 3.5–5.0)
Alkaline Phosphatase: 122 U/L (ref 38–126)
Anion gap: 8 (ref 5–15)
BUN: 20 mg/dL (ref 8–23)
CO2: 23 mmol/L (ref 22–32)
Calcium: 8.9 mg/dL (ref 8.9–10.3)
Chloride: 104 mmol/L (ref 98–111)
Creatinine: 1.03 mg/dL (ref 0.61–1.24)
GFR, Est AFR Am: >60 mL/min (ref >60)
GFR, Estimated: >60 mL/min (ref >60)
Glucose, Bld: 101 mg/dL — ABNORMAL HIGH (ref 70–99)
Potassium: 4.5 mmol/L (ref 3.5–5.1)
Sodium: 135 mmol/L (ref 135–145)
Total Bilirubin: 0.6 mg/dL (ref 0.3–1.2)
Total Protein: 6.9 g/dL (ref 6.5–8.1)

## 2018-07-09 MED ORDER — NYSTATIN 100000 UNIT/ML MT SUSP: 5.0000 mL | Freq: Four times a day (QID) | OROMUCOSAL | 0 refills | Status: AC

## 2018-07-09 NOTE — Telephone Encounter (Signed)
Scheduled appt per 5/15 los.  Patient and patient spouse aware of appt date and time,

## 2018-07-14 ENCOUNTER — Ambulatory Visit (HOSPITAL_COMMUNITY)
Admission: RE | Admit: 2018-07-14 | Discharge: 2018-07-14 | Disposition: A | Discharge status: Home / Self Care | Payer: 59 | Source: Ambulatory Visit | Attending: Hematology | Admitting: Hematology

## 2018-07-14 ENCOUNTER — Other Ambulatory Visit: Payer: Self-pay

## 2018-07-14 DIAGNOSIS — C090 Malignant neoplasm of tonsillar fossa: Secondary | ICD-10-CM

## 2018-07-14 DIAGNOSIS — M542 Cervicalgia: Secondary | ICD-10-CM | POA: Diagnosis not present

## 2018-07-14 MED ORDER — IOHEXOL 300 MG/ML  SOLN
75.0000 mL | Freq: Once | INTRAMUSCULAR | Status: AC | PRN
  Administered 2018-07-14: 14:00:00 75 mL via INTRAVENOUS

## 2018-07-14 MED ORDER — SODIUM CHLORIDE (PF) 0.9 % IJ SOLN
INTRAMUSCULAR | Status: AC
  Filled 2018-07-14: qty 50

## 2018-07-26 ENCOUNTER — Other Ambulatory Visit: Payer: 59

## 2018-07-26 ENCOUNTER — Ambulatory Visit: Payer: 59 | Admitting: Hematology

## 2018-07-26 NOTE — Progress Notes (Signed)
HEMATOLOGY/ONCOLOGY FOLLOW UP VISIT NOTE  Date of Service: 07/27/2018  Patient Care Team: Claretta Fraise, MD as PCP - General (Family Medicine) Jodi Marble, MD as Consulting Physician (Otolaryngology) Eppie Gibson, MD as Attending Physician (Radiation Oncology) Leota Sauers, RN as Oncology Nurse Navigator (Oncology) Karie Mainland, RD as Dietitian (Nutrition) Jomarie Longs, PT (Inactive) as Physical Therapist (Physical Therapy) Sharen Counter, CCC-SLP as Speech Language Pathologist (Speech Pathology) Kennith Center, LCSW as Social Worker  CHIEF COMPLAINTS/PURPOSE OF CONSULTATION:  F/u for tonsillar carcinoma Pulmonary nodule  HISTORY OF PRESENTING ILLNESS:   Terry Pacheco is a wonderful 68 y.o. male who has been referred to [place] by ENT Specialist, Dr. Erik Obey for evaluation and management of invasive squamous cell carcinoma of tonsil.   He has a PMHx of GERD, HTN, High cholesterol which he takes medications for. He denies PMHx of seizures. He is accompanied by his wife and sister today. He reports that he is doing well overall. The pt initially presented with sudden-onset right lateral tongue numbness that has been ongoing for approximately 1 year. Subsequently, he had left sided gum swelling and intermittent sore throat symptoms. He notes no stroke-like symptoms. He notes that following these symptoms, he was evaluated by multiple providers including an ENT specialist and a dentist prior to meeting with Dr. Erik Obey. Pt wife noted swelling and discoloration inside his mouth prior to the patient being evaluated by Dr. Erik Obey. At his first visit with Dr. Erik Obey, he noted an abnormality to his left tonsil and completed a biopsy that same day.   He has had CT soft tissue neck with contrast on 11/18/2016 with results showing: Fullness of the right palatine tonsil. Surrounding fat planes are ill-defined. This area is obscured by dental artifact. Possible right tonsillar  carcinoma.   The patient has had biopsy completed on 11/19/2016 with results of: "RIGHT FAUCIAL TONSIL", BIOPSY: Squamous cell carcinoma, suspicious for superficial invasion in this material. Immunostain p16 positive.   He also had a PET scan completed on 12/02/2016 with results revealing: IMPRESSION: 1. Mildly asymmetric tonsillar activity along the right tonsillar sinus, maximum SUV 7.7 as compared to the left side 5.2, suspicious in this setting for right-sided malignancy. There is also a mildly enlarged and hypermetabolic right level IIa lymph node with maximum SUV of 8.4. No evidence of other metastatic spread. 2. Faint accentuation of activity in the left lateral prostate gland apex, but low-grade. Correlate with PSA level in determining whether further workup is warranted. 3. Other imaging findings of potential clinical significance: Aortic Atherosclerosis (ICD10-I70.0) and Emphysema (ICD10-J43.9). Coronary atherosclerosis. Small type 1 hiatal hernia. Bilateral renal cysts. Colonic diverticulosis. Lastly, he had a MR Face trigeminal on 12/09/2016 with results of IMPRESSION: 1. No evidence of perineural spread of malignancy. 2. Mild asymmetry of the palatine tonsils without discrete mass. Correlate with direct visualization.   As far as surgeries, he has had a polyp on left sided throat that was removed 3-4 years ago that resulted negative for malignancy. He also has had a right hip replacement. Lastly, he has had a left inguinal hernia operation in 1978. He started smoking cigarettes at age 71 and would smoke 1 PPD while working. When he retired several years ago he smoked 0.5 PPD and has quit smoking cigarettes 1 month ago since his diagnosis. He states that he used a vape after quitting but was advised to quit via Dr. Isidore Moos. He occasionally consumes ETOH. 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 Denies allergies to medications at this time. He reports that  his father died of a stroke at age 50 and his mother died from cardiac issues. Mother had gastric cancer and was diagnosed in late 30's. Denies any other cancers of blood disorders in the family.    On review of systems, he denies hematuria, dysuria, or urinary retention. He reports urinary frequency over the past year. He reports bowel incontinence following a bowel movement x 1 year that has gradually improved more recently. He notes that his bowel incontinence has been intermittent. He has well formed stools without blood in stools or rectal bleeding. He denies increase in bowel movements and he has up to 1 bowel movement daily. He has had 2-3 occurrences of hemorrhoids with no recent flares. He denies mucous in his stools. He denies injuries or surgeries to his rectal area. He denies bladder incontinence. He has lower back pain that has been chronic and unchanged. He has a prior hx of diverticulitis approximately 10 years ago that was followed with colonoscopy and was then diagnosed with diverticulosis. He denies headache at this time. He is able to ambulate for prolonged periods without dyspnea on exertion. 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 He has had an audiogram completed by an ENT specialist in [place], [place]:  Surveillance   INTERIM HISTORY:    I connected with Terry Pacheco on 07/27/18 at  8:40 AM EDT by telephone and verified that I am speaking with the correct person using two identifiers.  I discussed the limitations, risks, security and privacy concerns of performing an evaluation and management service by telemedicine and the availability of in-person appointments. I also discussed with the patient that there may be a patient responsible charge related to this service. The patient expressed understanding and agreed to proceed.   Other persons participating in the visit and their role in the encounter: his  wife  Patient's location: his home Provider's location: my office at [facility] is called today for management and evaluation of his tonsillar cancer and pulmonary nodule. The patient's last visit with [place] was on 07/09/18. The pt reports that he is doing well overall.  The pt reports that he has been using the Nystatin mouthwash empirically which he notes has helped his previous mouth numbness. He notes that there is now only a little soreness/tenderness remaining. He denies any new symptoms or difficulty swallowing. He denies any current concerns for infections. The pt notes that he is currently enjoying good energy levels and is eating well.   Of note since the patient's last visit, pt has had a CT Neck completed on 07/14/18 with results revealing "No mass or lymphadenopathy identified. NIRADS category 1, routine surveillance. 2. Low-density mucosal thickening of the oral and hypopharynx, fatty replacement of submandibular glands, and non masslike fat stranding within submandibular/parapharyngeal compartments are compatible with posttreatment changes. 3. Stable left lung apex pulmonary nodule."   On review of systems, pt reports improved mouth soreness, resolved mouth numbness, good energy levels, eating well, and denies concerns for infections, difficulty swallowing, and any other symptoms.    MEDICAL HISTORY:  Past Medical History:  Diagnosis Date   Aortic stenosis    Mild to moderate by echo 11/2016   Arthritis    Cancer ([place]) 10/2016   cancer of the tonsil/  35 radiation treatments/4 doses of chemo/last radiatio 02/18/2017   GERD (gastroesophageal reflux disease) 07/06/2014   History of radiation therapy 12/30/2016- 02/18/2017   Right Tonsil and bilateral neck/ 70 Gy in 35 fractions to gross diseasae, 63 gy in 35 fractions to high risk nodal echelons, and 56 Gy in 35 fraction to intermediate risk nodal echelons.    Hypertension    Sleep apnea     does not use CPAP   Wears glasses     SURGICAL HISTORY: Past Surgical History:  Procedure Laterality Date   HERNIA REPAIR     right inguinal hernia   IR CV LINE INJECTION  01/14/2017   IR FLUORO GUIDE PORT INSERTION RIGHT  12/25/2016   IR GASTROSTOMY TUBE MOD SED  12/25/2016   IR GASTROSTOMY TUBE REMOVAL  06/05/2017   IR REMOVAL TUN ACCESS W/ PORT W/O FL MOD SED  03/06/2017   IR US GUIDE VASC ACCESS RIGHT  12/25/2016   LEFT HEART CATH AND CORONARY ANGIOGRAPHY N/A 12/07/2017   Procedure: LEFT HEART CATH AND CORONARY ANGIOGRAPHY;  Surgeon: Belva Crome, MD;  Location: [facility];  Service: Cardiovascular;  Laterality: N/A;   polyp removal  2008   during colonoscopy   TOTAL HIP ARTHROPLASTY Right 10/16/2015   Procedure: RIGHT TOTAL HIP ARTHROPLASTY ANTERIOR APPROACH;  Surgeon: Paralee Cancel, MD;  Location: [facility];  Service: Orthopedics;  Laterality: Right;   vocal cord polyp      removed 2-3 years ago    SOCIAL HISTORY: Social History   Socioeconomic History   Marital status: Married    Spouse name: Not on file   Number of children: 1   Years of education: Not on file   Highest education level: Not on file  Occupational History   Occupation: Retired     Fish farm manager: [employer]  Social Needs   Financial resource strain: Not on file   Food insecurity:    Worry: Not on file    Inability: Not on file   Transportation needs:    Medical: Not on file    Non-medical: Not on file  Tobacco Use   Smoking status: Former Smoker    Packs/day: 0.50    Years: 45.00    Pack years: 22.50    Types: Cigarettes    Start date: 01/12/1983    Last attempt to quit: 11/17/2016    Years since quitting: 1.6   Smokeless tobacco: Never Used  Substance and Sexual Activity   Alcohol use: Not Currently    Alcohol/week: 0.0 standard drinks   Drug use: Yes    Types: Marijuana    Comment: last use 10/09/2015   Sexual activity: Not on file  Lifestyle   Physical  activity:    Days per week: Not on file    Minutes per session: Not on file   Stress: Not on file  Relationships   Social connections:    Talks on phone: Not on file    Gets together: Not on file    Attends religious service: Not on file    Active member of club or organization: Not on file    Attends meetings of clubs or organizations: Not on file    Relationship status: Not on file   Intimate partner violence:    Fear of current or ex partner: Not on file    Emotionally abused: Not on file    Physically abused: Not on file    Forced sexual activity: Not on file  Other Topics Concern   Not on file  Social History Narrative   Not on file    FAMILY HISTORY: Family History  Problem Relation Age of Onset   Heart disease Mother    Stroke Father     ALLERGIES:  is allergic to crestor [rosuvastatin].  MEDICATIONS:  Current Outpatient Medications  Medication Sig Dispense Refill   acetaminophen (TYLENOL) 500 MG tablet Take 1,000 mg by mouth daily as needed for moderate pain or headache.     amLODipine (NORVASC) 10 MG tablet TAKE 1 TABLET EVERY DAY FOR FOR BLOOD PRESSURE 90 tablet 1   aspirin EC 81 MG tablet Take 81 mg by mouth daily.     atorvastatin (LIPITOR) 40 MG tablet Take 1 tablet (40 mg total) by mouth daily. 90 tablet 1   calcium carbonate (TUMS - DOSED IN MG ELEMENTAL CALCIUM) 500 MG chewable tablet Chew 2 tablets by mouth daily as needed for indigestion or heartburn.     escitalopram (LEXAPRO) 20 MG tablet TAKE 1/2 TABLET DAILY (Patient taking differently: Take 10 mg by mouth daily. ) 45 tablet 3   ezetimibe (ZETIA) 10 MG tablet Take 1 tablet (10 mg total) by mouth daily. 90 tablet 3   fluticasone (FLONASE) 50 MCG/ACT nasal spray SPRAY 2 SPRAYS INTO EACH NOSTRIL EVERY DAY (Patient taking differently: Place 2 sprays into both nostrils daily as needed for allergies. ) 48 g 0   lidocaine (XYLOCAINE) 2 % solution Mix 1 part 2%viscous lidocaine,1part H2O.Swish  and swallow 80mL of this mixture, 71min before meals and at bedtime, up to QID (Patient taking differently: Mix 1 part 2%viscous lidocaine,1part H2O.Swish and swallow 52mL of this mixture, 15min before meals and at bedtime as needed for mouth pain) 100 mL 5   Liniments (SALONPAS PAIN RELIEF PATCH EX) Apply 1 patch topically daily as needed (pain).     omeprazole (PRILOSEC) 20 MG capsule TAKE 1 CAPSULE BY MOUTH EVERY DAY 90 capsule 1   sodium fluoride (FLUORISHIELD) 1.1 % GEL dental gel Instill one drop of gel into each tooth space of fluoride tray. Place over teeth for 5 minutes. Remove. Spit out excess. Repeat nightly 120 mL prn   No current facility-administered medications for this visit.     REVIEW OF SYSTEMS:    A 10+ POINT REVIEW OF SYSTEMS WAS OBTAINED including neurology, dermatology, psychiatry, cardiac, respiratory, lymph, extremities, GI, GU, Musculoskeletal, constitutional, breasts, reproductive, HEENT.  All pertinent positives are noted in the HPI.  All others are negative.   PHYSICAL EXAMINATION: ECOG PERFORMANCE STATUS: 1 - Symptomatic but completely ambulatory There were no vitals filed for this visit.Marland Kitchen  Phone visit  LABORATORY DATA:  I have reviewed the data as listed   CBC Latest Ref Rng & Units 07/09/2018 04/15/2018 01/27/2018  WBC 4.0 - 10.5 K/uL 6.0 6.0 5.7  Hemoglobin 13.0 - 17.0 g/dL 14.4 15.7 15.0  Hematocrit 39.0 - 52.0 % 43.6 45.9 44.1  Platelets 150 - 400 K/uL 130(L) 158 122(L)     . CMP Latest Ref Rng & Units 07/09/2018 04/15/2018 01/27/2018  Glucose 70 - 99 mg/dL 101(H) 98 93  BUN 8 - 23 mg/dL 20 20 16   Creatinine 0.61 - 1.24 mg/dL 1.03 1.19 1.24  Sodium 135 - 145 mmol/L 135 135 137  Potassium 3.5 - 5.1 mmol/L 4.5 4.6 4.5  Chloride 98 - 111 mmol/L 104 97 104  CO2 22 - 32 mmol/L 23 25 26   Calcium 8.9 - 10.3 mg/dL 8.9 9.5 9.3  Total Protein  6.5 - 8.1 g/dL 6.9 6.9 6.9  Total Bilirubin 0.3 - 1.2 mg/dL 0.6 0.4 0.8  Alkaline Phos 38 - 126 U/L 122 143(H)  121  AST 15 - 41 U/L 18 12 15   ALT 0 - 44 U/L 17 16 10    PATHOLOGY:    RADIOGRAPHIC STUDIES: I have personally reviewed the radiological images as listed and agreed with the findings in the report.  Ct Soft Tissue Neck W Contrast  Result Date: 07/14/2018 CLINICAL DATA:  68 y/o M; history of tonsillar cancer post chemo and radiation therapy. Right-sided pharyngeal sensitivity and discomfort, rule out tumor recurrence. EXAM: CT NECK WITH CONTRAST TECHNIQUE: Multidetector CT imaging of the neck was performed using the standard protocol following the bolus administration of intravenous contrast. CONTRAST:  26mL OMNIPAQUE IOHEXOL 300 MG/ML  SOLN COMPARISON:  12/08/2017 PET-CT. FINDINGS: Pharynx and larynx: There is low-density mucosal thickening of the oral and hypopharynx. No masslike enhancement. Oropharynx is partially obscured by streak artifact from dental hardware. There is non masslike fat stranding within the fat planes of the submandibular and parapharyngeal compartments. Salivary glands: Fatty replacement of submandibular glands bilaterally. Parotid glands are unremarkable. Thyroid: Normal. Lymph nodes: None enlarged or abnormal density. Vascular: Calcific atherosclerosis of the aorta and carotid systems without high-grade stenosis. Limited intracranial: Negative. Visualized orbits: Negative. Mastoids and visualized paranasal sinuses: Clear. Skeleton: Moderate spondylosis of the cervical spine greatest at the C4-C6 levels. Uncovertebral and facet hypertrophy encroach on the neural foramen at the right C3-4, bilateral C4-5, bilateral C5-6, bilateral C6-7 levels. No high-grade bony spinal canal stenosis. Upper chest: Irregular left lung apex pulmonary nodule measures 13 x 11 mm (series 2, image 92), stable given differences in slice selection. Mild centrilobular emphysema. Biapical pleuroparenchymal scarring. Other: None. IMPRESSION: 1. No mass or lymphadenopathy identified. NIRADS category 1, routine  surveillance. 2. Low-density mucosal thickening of the oral and hypopharynx, fatty replacement of submandibular glands, and non masslike fat stranding within submandibular/parapharyngeal compartments are compatible with posttreatment changes. 3. Stable left lung apex pulmonary nodule. Electronically Signed   By: Kristine Garbe M.D.   On: 07/14/2018 22:22    ASSESSMENT & PLAN:   68 y.o. 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 -creatinine WNL. Some concern for decreased hearing and baseline tinnitus due to previous job noise related injury. Baseline audiogram showed normal symmetric low-frequency hearing, dropping off substantially above 2000Hz .  s/p recent completion of concurrent chemo-radiation with weekly Cisplatin, 12/30/2016 - 02/18/2017  He stopped tube feeding 04/16/17. He is eating by mouth now.  04/30/16 PET which shows no enlarged or hypermetabolic activity in cervical lymph nodes and no residual primary focus of head and neck disease. Has new hypermetabolic nodule in upper lobe in left lung. This favors inflammatory process.  12/08/17 PET/CT revealed There is a new focus of abnormal asymmetric uptake within the right side of tongue within SUV max of 6.65. Suspicious for recurrence of disease versus new site of disease. 2. No significant FDG uptake associated with the irregular nodule within the left lung apex which may be postinflammatory in etiology  01/13/18 MRI Neck revealed No evident mass to explain PET findings, which may have been muscular. 2. Negative for nodal disease.  2. Pulmonary nodule  Initially seen on 04/30/16 PET/CT  06/17/17 CT Chest shows stable left lung nodule, with no new developments, overall stable. Will continue to  monitor closely.    09/09/17 CT Chest revealed Stable to decrease in size irregular pulmonary nodule in the posterior left lung apex, hypermetabolic on PET-CT from 65/46/5035. Aortic Atherosclerosis  and Emphysema. Multi vessel coronary artery atherosclerotic calcifications.   12/15/17 CT Chest revealed Stable appearing left apical density, likely scar tissue given the surrounding changes. This was mildly hypermetabolic on the PET-CT from 04/29/2017 but not hypermetabolic on the more recent PET-CT from 12/08/2017. Recommend follow-up noncontrast chest CT in 6 months. 2. No enlarged mediastinal or hilar lymph nodes. 3. Stable advanced emphysematous changes and apical pulmonary scarring. No new/acute pulmonary findings. 4. Stable advanced atherosclerotic calcifications involving the thoracic aorta and branch vessels.  03/23/18 CT Chest revealed "No change in the appearance of left apical nodular density which appears confluent with areas of apical pleuroparenchymal scarring and may be postinflammatory/infectious in etiology. 2. New tiny nodule within the right lower lobe measuring 2 mm is nonspecific. Attention on follow-up imaging is advised. 3. Aortic Atherosclerosis and Emphysema 4. Multi vessel coronary artery atherosclerotic calcifications".  Plan  -Discussed the 07/14/18 CT Neck which revealed "No mass or lymphadenopathy identified. NIRADS category 1, routine surveillance. 2. Low-density mucosal thickening of the oral and hypopharynx, fatty replacement of submandibular glands, and non masslike fat stranding within submandibular/parapharyngeal compartments are compatible with posttreatment changes. 3. Stable left lung apex pulmonary nodule." -Patient's symptoms have improved and empiric Nystatin has been helpful -Recommend salt and baking soda mouthwashes 4-5 times a day -Recommend that the pt return to care with his ENT Dr. Delia Chimes for local surveillance in next 2-3 months, and I will see pt back in 6  months -The pt shows no clinical or lab progression/return of his tonsillar cancer at this time.  -No indication for further treatment at this time. -Will monitor pt with repeat CT imaging in 6 months -Recommend returning to care with ENT Dr. Erik Obey, coordinate with head and neck nurse navigator Gayleen Orem, within the next month for clinical evaluation -Would like repeat imaging every 6 months and clinical observation every 3 months by myself or ENT Dr. Erik Obey  -Will see the pt back in 6 months   CT chest without contrast in 22 weeks RTC with Dr Irene Limbo in 24 weeks   All of the patients questions were answered with apparent satisfaction. The patient knows to call the clinic with any problems, questions or concerns.  The total time spent in the appt was 20 minutes and more than 50% was on counseling and direct patient cares.   Sullivan Lone MD MS AAHIVMS [facility] Hematology/Oncology Physician [facility]  (Office):       [phone] (Work cell):  [phone] (Fax):           [phone]  07/27/2018 10:26 AM  I, Baldwin Jamaica, am acting as a scribe for Dr. Sullivan Lone.   .I have reviewed the above documentation for accuracy and completeness, and I agree with the above. Brunetta Genera MD

## 2018-07-27 ENCOUNTER — Inpatient Hospital Stay: Payer: 59 | Attending: Hematology | Admitting: Hematology

## 2018-07-27 ENCOUNTER — Telehealth: Payer: Self-pay | Admitting: Hematology

## 2018-07-27 DIAGNOSIS — Z87891 Personal history of nicotine dependence: Secondary | ICD-10-CM

## 2018-07-27 DIAGNOSIS — I10 Essential (primary) hypertension: Secondary | ICD-10-CM

## 2018-07-27 DIAGNOSIS — R911 Solitary pulmonary nodule: Secondary | ICD-10-CM

## 2018-07-27 DIAGNOSIS — Z85818 Personal history of malignant neoplasm of other sites of lip, oral cavity, and pharynx: Secondary | ICD-10-CM | POA: Diagnosis not present

## 2018-07-27 DIAGNOSIS — C090 Malignant neoplasm of tonsillar fossa: Secondary | ICD-10-CM

## 2018-07-27 NOTE — Telephone Encounter (Signed)
Scheduled appt per 6/2 los.  A calendar will be mailed out 

## 2018-08-28 IMAGING — CR DG CHEST 2V
2 series · 2 of 2 positions shown · non-contrast
Comparison: PET-CT 12/02/2016.  Chest x-ray 11/13/2016 .

CLINICAL DATA: Wheezing.  Shortness of breath.

EXAM:
CHEST  2 VIEW

[w chest pa]
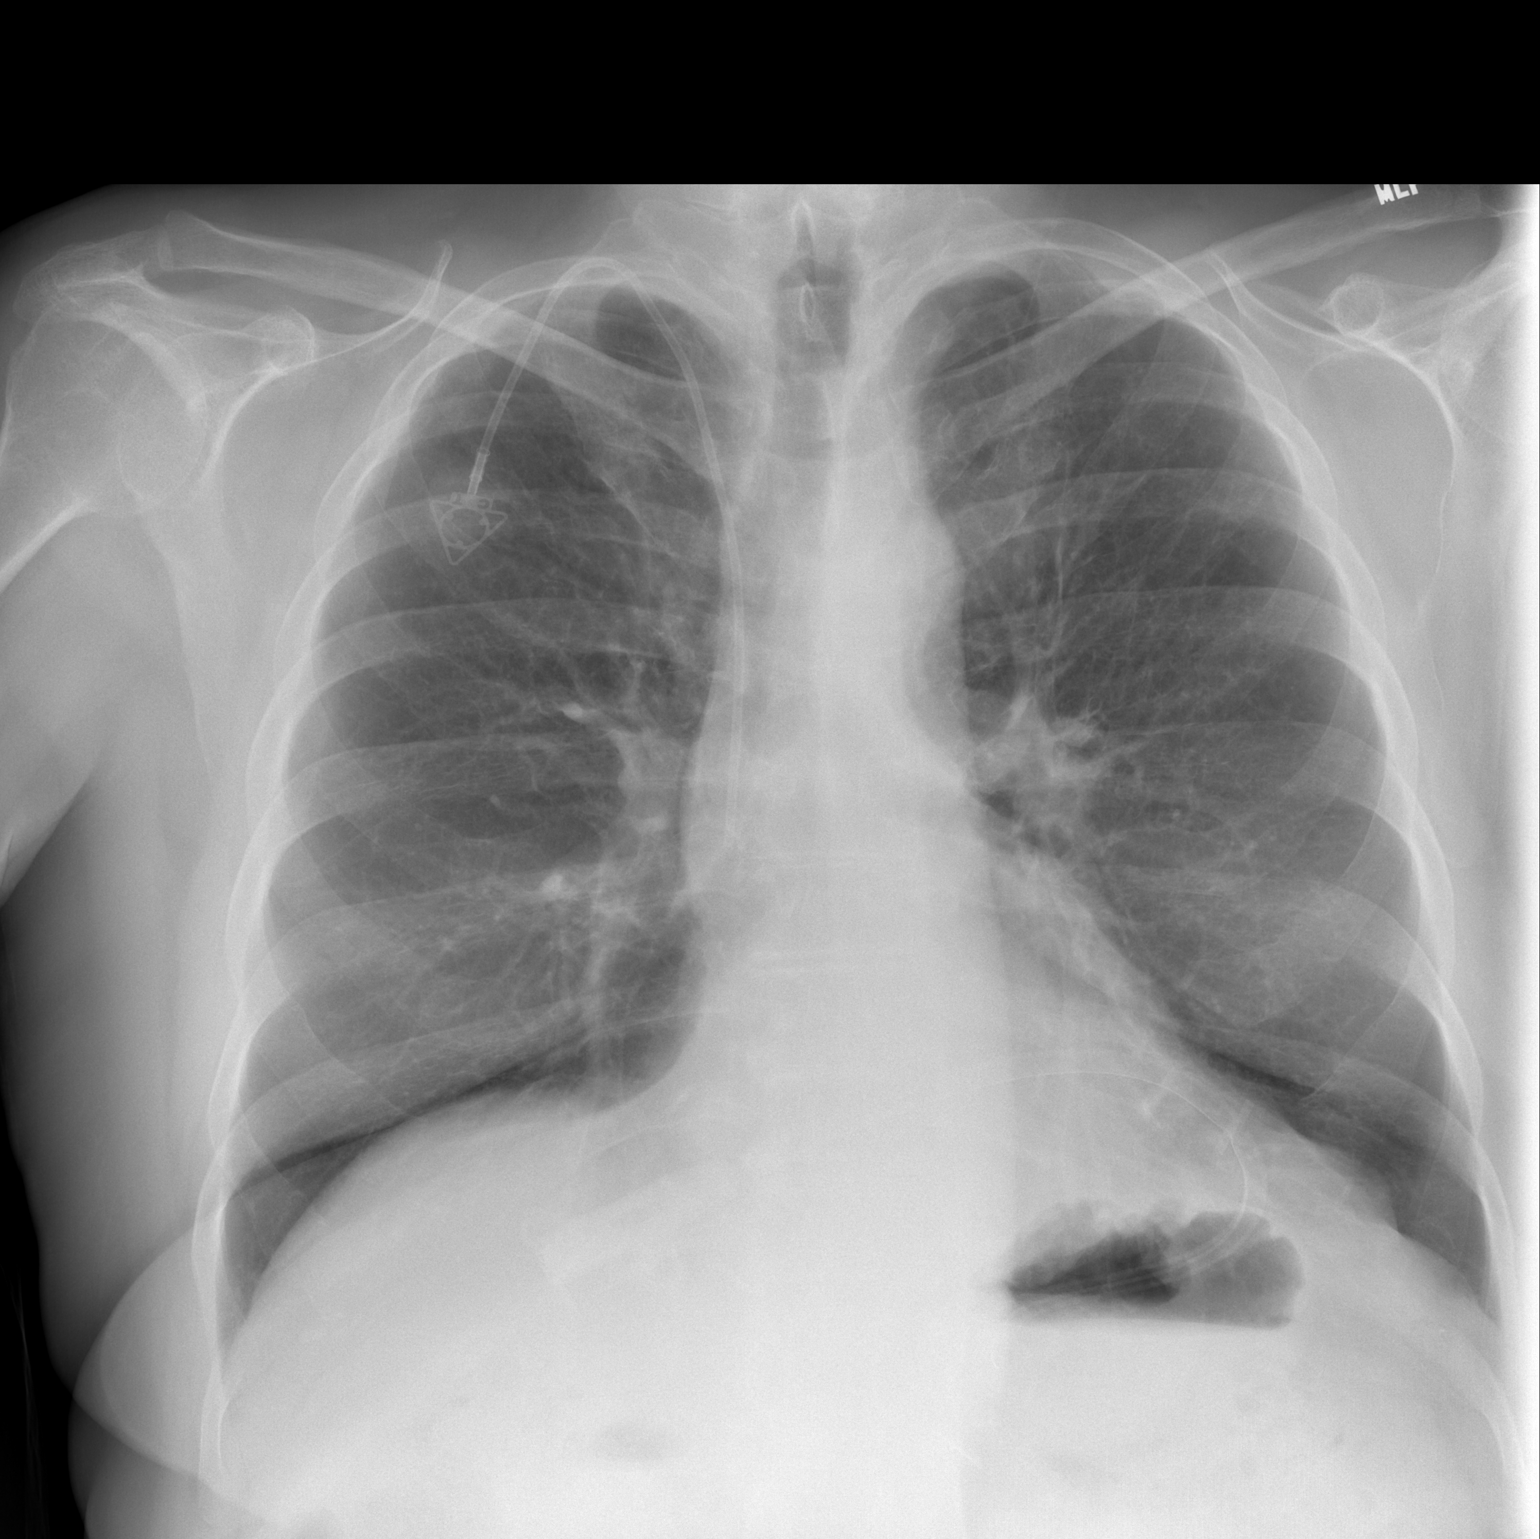

[w chest lat]
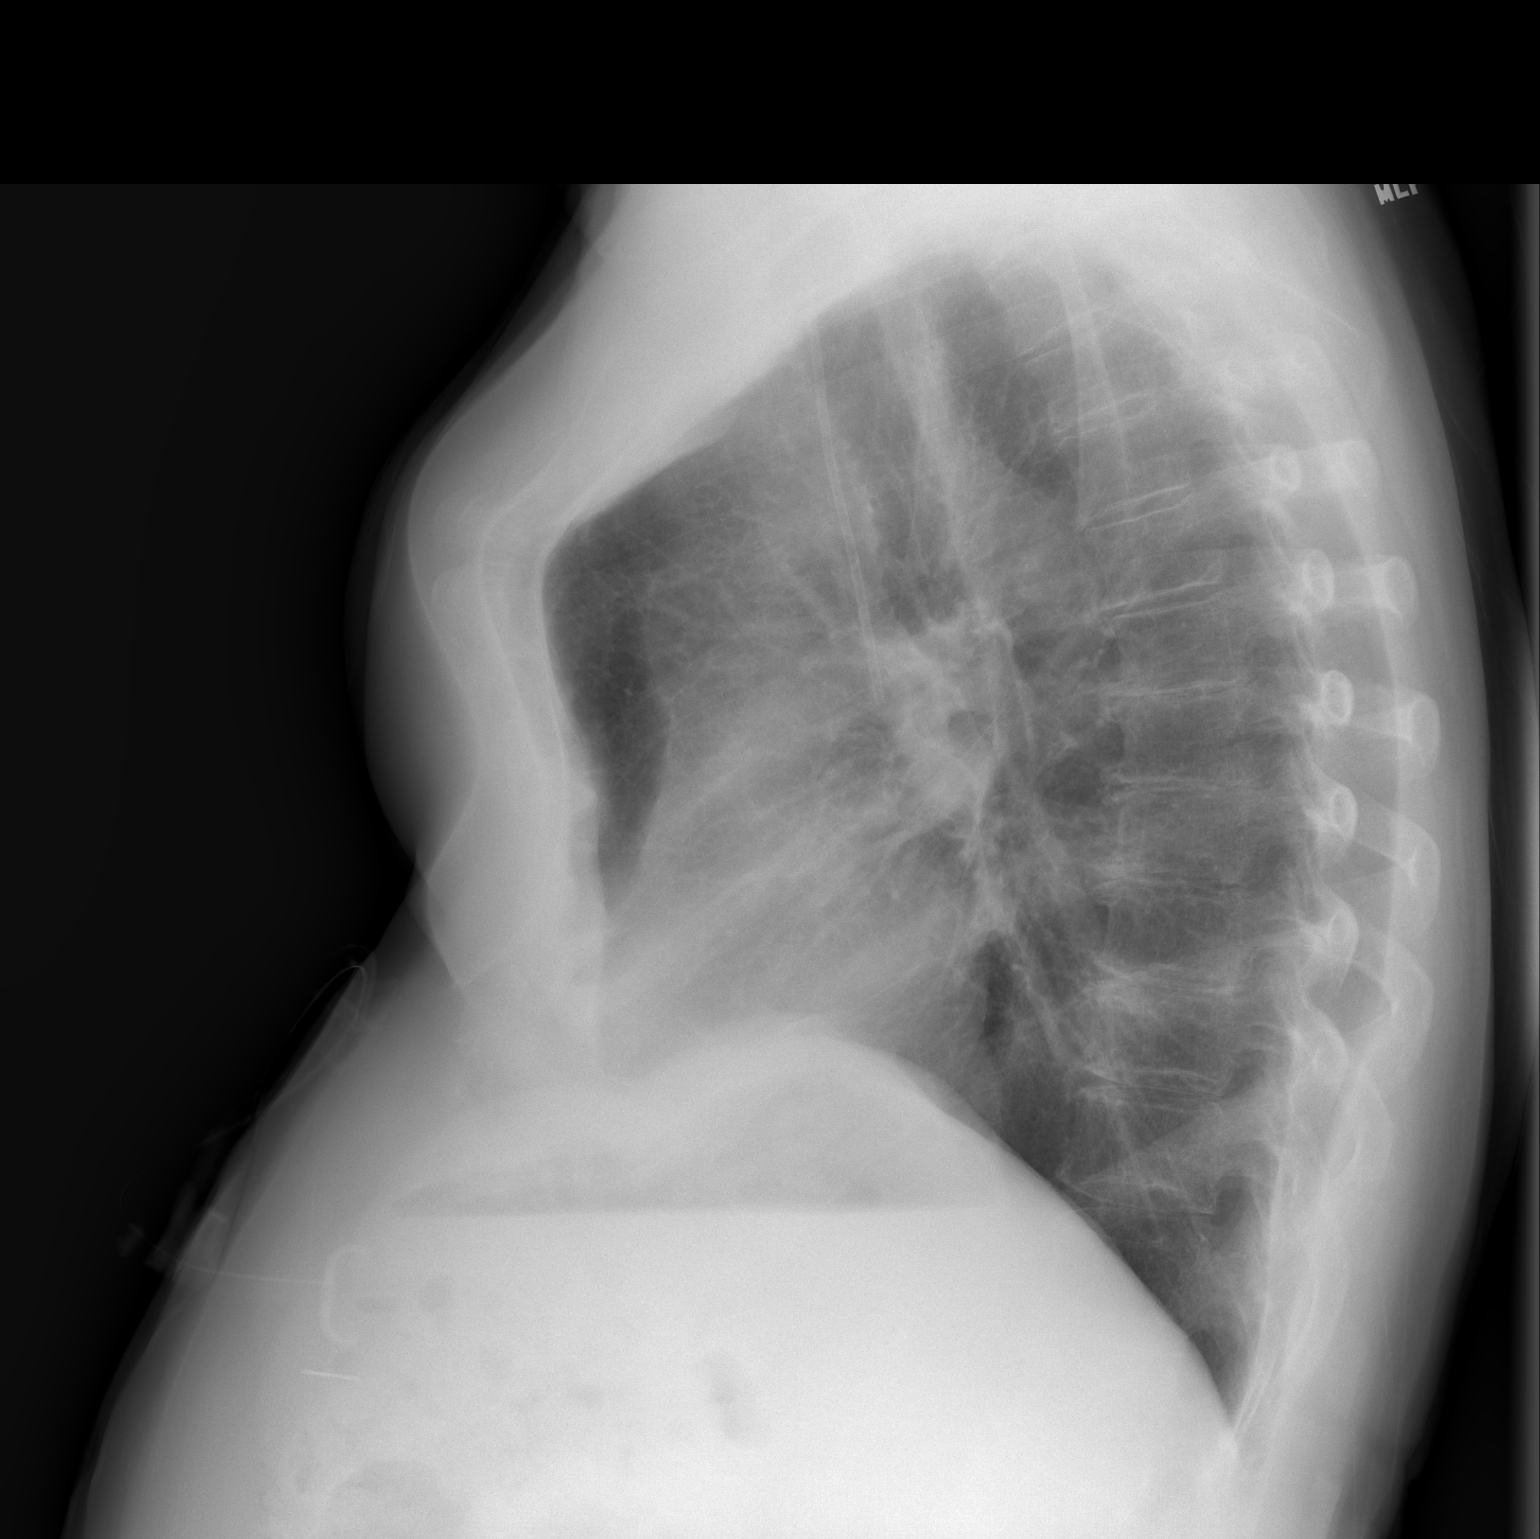

[2 of 2 positions shown; findings below may reference images not displayed]

FINDINGS: PowerPort catheter noted with lead tip over the superior vena cava.
Heart size normal. Lungs are clear. No pleural effusion or
pneumothorax. Tubing noted over the left upper abdomen consistent
with gastrostomy catheter. No acute bony abnormality.
IMPRESSION: 1. PowerPort catheter noted with tip over the superior vena cava.
Gastrostomy tube noted over the left upper quadrant. 2. No acute
cardiopulmonary disease.

## 2018-09-06 DIAGNOSIS — D3705 Neoplasm of uncertain behavior of pharynx: Secondary | ICD-10-CM | POA: Diagnosis not present

## 2018-09-06 DIAGNOSIS — C099 Malignant neoplasm of tonsil, unspecified: Secondary | ICD-10-CM | POA: Diagnosis not present

## 2018-09-22 NOTE — Progress Notes (Signed)
error 

## 2018-09-24 ENCOUNTER — Ambulatory Visit
Admission: RE | Admit: 2018-09-24 | Discharge: 2018-09-24 | Disposition: A | Discharge status: Home / Self Care | Payer: 59 | Source: Ambulatory Visit | Attending: Radiation Oncology | Admitting: Radiation Oncology

## 2018-09-26 ENCOUNTER — Other Ambulatory Visit: Payer: Self-pay | Admitting: Family Medicine

## 2018-10-05 DIAGNOSIS — H04123 Dry eye syndrome of bilateral lacrimal glands: Secondary | ICD-10-CM | POA: Diagnosis not present

## 2018-10-05 DIAGNOSIS — H2513 Age-related nuclear cataract, bilateral: Secondary | ICD-10-CM | POA: Diagnosis not present

## 2018-10-05 DIAGNOSIS — H35371 Puckering of macula, right eye: Secondary | ICD-10-CM | POA: Diagnosis not present

## 2018-10-05 DIAGNOSIS — H35013 Changes in retinal vascular appearance, bilateral: Secondary | ICD-10-CM | POA: Diagnosis not present

## 2018-10-15 ENCOUNTER — Ambulatory Visit (INDEPENDENT_AMBULATORY_CARE_PROVIDER_SITE_OTHER): Payer: 59 | Admitting: Family Medicine

## 2018-10-15 ENCOUNTER — Encounter: Payer: Self-pay | Admitting: Family Medicine

## 2018-10-15 DIAGNOSIS — K635 Polyp of colon: Secondary | ICD-10-CM | POA: Diagnosis not present

## 2018-10-15 DIAGNOSIS — E785 Hyperlipidemia, unspecified: Secondary | ICD-10-CM

## 2018-10-15 DIAGNOSIS — F411 Generalized anxiety disorder: Secondary | ICD-10-CM

## 2018-10-15 DIAGNOSIS — I10 Essential (primary) hypertension: Secondary | ICD-10-CM

## 2018-10-15 NOTE — Progress Notes (Signed)
Subjective:    Patient ID: Terry Pacheco, male    DOB: 02/19/1951, 68 y.o.   MRN: [id number]   HPI: Terry Pacheco is a 68 y.o. male presenting for  presents for  follow-up of hypertension. Patient has no history of headache chest pain or shortness of breath or recent cough. Patient also denies symptoms of TIA such as focal numbness or weakness. Patient denies side effects from medication. States taking it regularly.   in for follow-up of elevated cholesterol. Doing well without complaints on current medication. Denies side effects of statin including myalgia and arthralgia and nausea. Currently no chest pain, shortness of breath or other cardiovascular related symptoms noted.  PT. Using lexapro for anxiety. Sx under good control.   Depression screen [facility] 2/9 04/15/2018 10/13/2017 09/18/2017 06/01/2017 05/29/2017  Decreased Interest 0 0 0 0 0  Down, Depressed, Hopeless 0 0 0 0 0  PHQ - 2 Score 0 0 0 0 0  Some recent data might be hidden     Relevant past medical, surgical, family and social history reviewed and updated as indicated.  Interim medical history since our last visit reviewed. Allergies and medications reviewed and updated.  ROS:  Review of Systems  Constitutional: Negative.   HENT: Negative.   Eyes: Negative for visual disturbance.  Respiratory: Negative for cough and shortness of breath.   Cardiovascular: Negative for chest pain and leg swelling.  Gastrointestinal: Negative for abdominal pain, diarrhea, nausea and vomiting.  Genitourinary: Negative for difficulty urinating.  Musculoskeletal: Negative for arthralgias and myalgias.  Skin: Negative for rash.  Neurological: Negative for headaches.  Psychiatric/Behavioral: Negative for sleep disturbance.     Social History   Tobacco Use  Smoking Status Former Smoker  . Packs/day: 0.50  . Years: 45.00  . Pack years: 22.50  . Types: Cigarettes  . Start date: 01/12/1983  . Quit date: 11/17/2016  . Years since quitting:  1.9  Smokeless Tobacco Never Used       Objective:     Wt Readings from Last 3 Encounters:  07/09/18 192 lb 1.6 oz (87.1 kg)  04/15/18 186 lb 2 oz (84.4 kg)  03/23/18 187 lb 6.4 oz (85 kg)     Exam deferred. Pt. Harboring due to COVID 19. Phone visit performed.   Assessment & Plan:   1. Essential hypertension   2. Hyperlipidemia with target LDL less than 100   3. GAD (generalized anxiety disorder)   4. Polyp of colon, unspecified part of colon, unspecified type     No orders of the defined types were placed in this encounter.   Orders Placed This Encounter  Procedures  . Ambulatory referral to Gastroenterology    Referral Priority:   Routine    Referral Type:   Consultation    Referral Reason:   Specialty Services Required    Number of Visits Requested:   1      Diagnoses and all orders for this visit:  Essential hypertension  Hyperlipidemia with target LDL less than 100  GAD (generalized anxiety disorder)  Polyp of colon, unspecified part of colon, unspecified type -     Ambulatory referral to Gastroenterology    Virtual Visit via telephone Note  I discussed the limitations, risks, security and privacy concerns of performing an evaluation and management service by telephone and the availability of in person appointments. The patient was identified with two identifiers. Pt.expressed understanding and agreed to proceed. Pt. Is at home. Dr. Livia Snellen is in  his office.  Follow Up Instructions:   I discussed the assessment and treatment plan with the patient. The patient was provided an opportunity to ask questions and all were answered. The patient agreed with the plan and demonstrated an understanding of the instructions.   The patient was advised to call back or seek an in-person evaluation if the symptoms worsen or if the condition fails to improve as anticipated.   Total minutes including chart review and phone contact time: 20   Follow up plan: Return  in about 6 months (around 04/17/2019).  Claretta Fraise, MD [place]

## 2018-10-21 ENCOUNTER — Other Ambulatory Visit: Payer: Self-pay | Admitting: Family Medicine

## 2018-11-05 ENCOUNTER — Other Ambulatory Visit: Payer: Self-pay | Admitting: Family Medicine

## 2018-11-08 DIAGNOSIS — H25013 Cortical age-related cataract, bilateral: Secondary | ICD-10-CM | POA: Diagnosis not present

## 2018-11-08 DIAGNOSIS — H2512 Age-related nuclear cataract, left eye: Secondary | ICD-10-CM | POA: Diagnosis not present

## 2018-11-08 DIAGNOSIS — H2513 Age-related nuclear cataract, bilateral: Secondary | ICD-10-CM | POA: Diagnosis not present

## 2018-11-08 DIAGNOSIS — H35033 Hypertensive retinopathy, bilateral: Secondary | ICD-10-CM | POA: Diagnosis not present

## 2018-11-08 DIAGNOSIS — H35371 Puckering of macula, right eye: Secondary | ICD-10-CM | POA: Diagnosis not present

## 2018-11-08 LAB — HM DIABETES EYE EXAM

## 2018-11-26 ENCOUNTER — Other Ambulatory Visit: Payer: Self-pay | Admitting: Family Medicine

## 2018-11-30 ENCOUNTER — Encounter: Payer: Self-pay | Admitting: Gastroenterology

## 2018-11-30 ENCOUNTER — Ambulatory Visit (INDEPENDENT_AMBULATORY_CARE_PROVIDER_SITE_OTHER): Payer: 59 | Admitting: Gastroenterology

## 2018-11-30 VITALS — BP 142/80 | HR 70 | Temp 97.9°F | Ht 69.0 in | Wt 199.4 lb

## 2018-11-30 DIAGNOSIS — K219 Gastro-esophageal reflux disease without esophagitis: Secondary | ICD-10-CM

## 2018-11-30 DIAGNOSIS — Z1211 Encounter for screening for malignant neoplasm of colon: Secondary | ICD-10-CM

## 2018-11-30 MED ORDER — PEG 3350-KCL-NA BICARB-NACL 420 G PO SOLR: 4000.0000 mL | Freq: Once | ORAL | 0 refills | Status: AC

## 2018-11-30 NOTE — Progress Notes (Signed)
[name]:  History: Terry Pacheco 11/30/2018  Referring provider: Claretta Fraise, MD  Reason for consult/chief complaint: Colonoscopy and Gastroesophageal Reflux   Subjective  HPI:  This is a very pleasant man referred by primary care for colon cancer screening and history of reflux. He was diagnosed with right tonsillar cancer in 2018, treated with chemotherapy and radiation.  ENT follow-up note from 09/06/2018 indicates no recurrence of the cancer.  Colonoscopy by Dr. Sharlett Pacheco June 2008 for constipation, rectal pain and heme positive stool. Severe pandiverticulosis seen, and small sigmoid hyperplastic polyp removed.  Terry Pacheco reports 14 to 15 years of heartburn generally well controlled with once daily PPI.  He denies dysphagia, odynophagia, nausea, vomiting early satiety or weight loss.  He has dry mouth from his previous radiation therapy. Denies bowel habit changes or rectal bleeding.  He was scheduled to have a screening colonoscopy with me a year ago, but was in the midst of work-up for his aortic stenosis.  I communicated with his primary cardiologist, Dr. Debara Pacheco, at that time.  Echocardiogram and catheterization were done as described below.  ROS:  Review of Systems  Constitutional: Negative for appetite change and unexpected weight change.  HENT: Negative for mouth sores and voice change.   Eyes: Negative for pain and redness.  Respiratory: Negative for cough and shortness of breath.   Cardiovascular: Negative for chest pain and palpitations.  Genitourinary: Negative for dysuria and hematuria.  Musculoskeletal: Negative for arthralgias and myalgias.  Skin: Negative for pallor and rash.  Neurological: Negative for weakness and headaches.  Hematological: Negative for adenopathy.     Past Medical History: Past Medical History:  Diagnosis Date  . Aortic stenosis    Mild to moderate by echo 11/2016  . Arthritis   . Cancer  ([place]) 10/2016   cancer of the tonsil/ 35 radiation treatments/4 doses of chemo/last radiatio 02/18/2017  . GERD (gastroesophageal reflux disease) 07/06/2014  . History of radiation therapy 12/30/2016- 02/18/2017   Right Tonsil and bilateral neck/ 70 Gy in 35 fractions to gross diseasae, 63 gy in 35 fractions to high risk nodal echelons, and 56 Gy in 35 fraction to intermediate risk nodal echelons.   . Hypertension   . Sleep apnea    does not use CPAP  . Wears glasses      Past Surgical History: Past Surgical History:  Procedure Laterality Date  . HERNIA REPAIR     right inguinal hernia  . IR CV LINE INJECTION  01/14/2017  . IR FLUORO GUIDE PORT INSERTION RIGHT  12/25/2016  . IR GASTROSTOMY TUBE MOD SED  12/25/2016  . IR GASTROSTOMY TUBE REMOVAL  06/05/2017  . IR REMOVAL TUN ACCESS W/ PORT W/O FL MOD SED  03/06/2017  . IR US GUIDE VASC ACCESS RIGHT  12/25/2016  . LEFT HEART CATH AND CORONARY ANGIOGRAPHY N/A 12/07/2017   Procedure: LEFT HEART CATH AND CORONARY ANGIOGRAPHY;  Surgeon: Belva Crome, MD;  Location: [facility];  Service: Cardiovascular;  Laterality: N/A;  . polyp removal  2008   during colonoscopy  . TOTAL HIP ARTHROPLASTY Right 10/16/2015   Procedure: RIGHT TOTAL HIP ARTHROPLASTY ANTERIOR APPROACH;  Surgeon: Paralee Cancel, MD;  Location: [facility];  Service: Orthopedics;  Laterality: Right;  . vocal cord polyp      removed 2-3 years ago      Family History: Family History  Problem Relation Age of Onset  . Heart disease Mother   . Stroke Father   .  Colon cancer Neg Hx   . Esophageal cancer Neg Hx   . Pancreatic cancer Neg Hx   . Stomach cancer Neg Hx   . Liver cancer Neg Hx     Social History: Social History   Socioeconomic History  . Marital status: Married    Spouse name: Not on file  . Number of children: 1  . Years of education: Not on file  . Highest education level: Not on file  Occupational History  . Occupation: Retired     Fish farm manager: [employer]  Social Needs  . Financial resource strain: Not on file  . Food insecurity    Worry: Not on file    Inability: Not on file  . Transportation needs    Medical: Not on file    Non-medical: Not on file  Tobacco Use  . Smoking status: Former Smoker    Packs/day: 0.50    Years: 45.00    Pack years: 22.50    Types: Cigarettes    Start date: 01/12/1983    Quit date: 11/17/2016    Years since quitting: 2.0  . Smokeless tobacco: Never Used  Substance and Sexual Activity  . Alcohol use: Not Currently    Alcohol/week: 0.0 standard drinks  . Drug use: Not Currently    Types: Marijuana    Comment: last use 10/09/2015  . Sexual activity: Not on file  Lifestyle  . Physical activity    Days per week: Not on file    Minutes per session: Not on file  . Stress: Not on file  Relationships  . Social Herbalist on phone: Not on file    Gets together: Not on file    Attends religious service: Not on file    Active member of club or organization: Not on file    Attends meetings of clubs or organizations: Not on file    Relationship status: Not on file  Other Topics Concern  . Not on file  Social History Narrative  . Not on file   Retired from [employer] and [employer] Enjoys fishing, yard work and spending time with his grandchildren.  Allergies: Allergies  Allergen Reactions  . Crestor [Rosuvastatin]     Muscle aches    Outpatient Meds: Current Outpatient Medications  Medication Sig Dispense Refill  . acetaminophen (TYLENOL) 500 MG tablet Take 1,000 mg by mouth daily as needed for moderate pain or headache.    Marland Kitchen amLODipine (NORVASC) 10 MG tablet TAKE 1 TABLET EVERY DAY FOR FOR BLOOD PRESSURE 90 tablet 1  . aspirin EC 81 MG tablet Take 81 mg by mouth daily.    Marland Kitchen atorvastatin (LIPITOR) 40 MG tablet TAKE 1 TABLET BY MOUTH EVERY DAY 90 tablet 1  . calcium carbonate (TUMS - DOSED IN MG ELEMENTAL CALCIUM) 500 MG chewable tablet Chew 2 tablets by mouth daily as needed for  indigestion or heartburn.    . escitalopram (LEXAPRO) 20 MG tablet TAKE 1/2 TABLET BY MOUTH ONCE DAILY 45 tablet 0  . fluticasone (FLONASE) 50 MCG/ACT nasal spray SPRAY 2 SPRAYS INTO EACH NOSTRIL EVERY DAY (Patient taking differently: Place 2 sprays into both nostrils daily as needed for allergies. ) 48 g 0  . lidocaine (XYLOCAINE) 2 % solution Mix 1 part 2%viscous lidocaine,1part H2O.Swish and swallow 71mL of this mixture, 22min before meals and at bedtime, up to QID (Patient taking differently: Mix 1 part 2%viscous lidocaine,1part H2O.Swish and swallow 64mL of this mixture, 7min before meals and at  bedtime as needed for mouth pain) 100 mL 5  . Liniments (SALONPAS PAIN RELIEF PATCH EX) Apply 1 patch topically daily as needed (pain).    Marland Kitchen omeprazole (PRILOSEC) 20 MG capsule TAKE 1 CAPSULE BY MOUTH EVERY DAY 90 capsule 0  . sodium fluoride (FLUORISHIELD) 1.1 % GEL dental gel Instill one drop of gel into each tooth space of fluoride tray. Place over teeth for 5 minutes. Remove. Spit out excess. Repeat nightly 120 mL prn  . ezetimibe (ZETIA) 10 MG tablet Take 1 tablet (10 mg total) by mouth daily. 90 tablet 3  . polyethylene glycol-electrolytes (NULYTELY/GOLYTELY) 420 g solution Take 4,000 mLs by mouth once for 1 dose. 4000 mL 0   No current facility-administered medications for this visit.       ___________________________________________________________________ Objective   Exam:  BP (!) 142/80   Pulse 70   Temp 97.9 F (36.6 C)   Ht 5\' 9"  (1.753 m)   Wt 199 lb 6.4 oz (90.4 kg)   BMI 29.45 kg/m    General: Well-appearing, normal vocal quality  Eyes: sclera anicteric, no redness  ENT: oral mucosa moist without lesions, no cervical or supraclavicular lymphadenopathy  CV: RRR without murmur, S1/S2, no JVD, no peripheral edema  Resp: clear to auscultation bilaterally, normal RR and effort noted  GI: soft, no tenderness, with active bowel sounds. No guarding or palpable organomegaly  noted.  PEG site in epigastrium.  Skin; warm and dry, no rash or jaundice noted  Neuro: awake, alert and oriented x 3. Normal gross motor function and fluent speech  Cath report 11/2017: Widely patent coronary arteries Right dominant coronary anatomy Normal left main 20% proximal to mid LAD.  The vessel wraps around the left ventricular apex.  The vessel is tortuous. Normal small circumflex. Tortuous, normal RCA. Mild aortic stenosis with peak to peak gradient 26 mmHg.  The ascending aorta is mildly calcified.  The leaflets are mildly to moderately calcified  eCho:  LVEF 55%  Assessment: Encounter Diagnoses  Name Primary?  . Gastroesophageal reflux disease, unspecified whether esophagitis present Yes  . Special screening for malignant neoplasms, colon     He began having reflux symptoms with primarily heartburn many years ago, under good control since then on once daily PPI.  He has multiple risk factors for Barrett's esophagus, and I recommended he have upper endoscopy to screen for that.  He was agreeable.  He is also in need of screening colonoscopy.  Plan:  EGD and colonoscopy.  The benefits and risks of the planned procedure were described in detail with the patient or (when appropriate) their health care proxy.  Risks were outlined as including, but not limited to, bleeding, infection, perforation, adverse medication reaction leading to cardiac or pulmonary decompensation, pancreatitis (if ERCP).  The limitation of incomplete mucosal visualization was also discussed.  No guarantees or warranties were given.   Thank you for the courtesy of this consult.  Please call me with any questions or concerns.  Nelida Meuse III  CC: Referring provider noted above

## 2018-11-30 NOTE — Patient Instructions (Signed)
If you are age 68 or older, your body mass index should be between 23-30. Your Body mass index is 29.45 kg/m. If this is out of the aforementioned range listed, please consider follow up with your Primary Care Provider.  If you are age 60 or younger, your body mass index should be between 19-25. Your Body mass index is 29.45 kg/m. If this is out of the aformentioned range listed, please consider follow up with your Primary Care Provider.   You have been scheduled for an endoscopy and colonoscopy. Please follow the written instructions given to you at your visit today. Please pick up your prep supplies at the pharmacy within the next 1-3 days. If you use inhalers (even only as needed), please bring them with you on the day of your procedure. Your physician has requested that you go to www.startemmi.com and enter the access code given to you at your visit today. This web site gives a general overview about your procedure. However, you should still follow specific instructions given to you by our office regarding your preparation for the procedure.  It was a pleasure to see you today!  Dr. Loletha Carrow

## 2018-12-07 DIAGNOSIS — H25812 Combined forms of age-related cataract, left eye: Secondary | ICD-10-CM | POA: Diagnosis not present

## 2018-12-07 DIAGNOSIS — H52212 Irregular astigmatism, left eye: Secondary | ICD-10-CM | POA: Diagnosis not present

## 2018-12-14 ENCOUNTER — Encounter: Payer: Self-pay | Admitting: Gastroenterology

## 2018-12-15 ENCOUNTER — Telehealth: Payer: Self-pay | Admitting: Gastroenterology

## 2018-12-16 ENCOUNTER — Encounter: Payer: Self-pay | Admitting: Gastroenterology

## 2018-12-16 ENCOUNTER — Other Ambulatory Visit: Payer: Self-pay

## 2018-12-16 ENCOUNTER — Ambulatory Visit (AMBULATORY_SURGERY_CENTER): Payer: 59 | Admitting: Gastroenterology

## 2018-12-16 VITALS — BP 119/80 | HR 78 | Temp 98.0°F | Resp 12 | Ht 69.0 in | Wt 199.0 lb

## 2018-12-16 DIAGNOSIS — K3189 Other diseases of stomach and duodenum: Secondary | ICD-10-CM | POA: Diagnosis not present

## 2018-12-16 DIAGNOSIS — D122 Benign neoplasm of ascending colon: Secondary | ICD-10-CM

## 2018-12-16 DIAGNOSIS — K219 Gastro-esophageal reflux disease without esophagitis: Secondary | ICD-10-CM | POA: Diagnosis not present

## 2018-12-16 DIAGNOSIS — Z1211 Encounter for screening for malignant neoplasm of colon: Secondary | ICD-10-CM | POA: Diagnosis not present

## 2018-12-16 DIAGNOSIS — G4733 Obstructive sleep apnea (adult) (pediatric): Secondary | ICD-10-CM | POA: Diagnosis not present

## 2018-12-16 DIAGNOSIS — K449 Diaphragmatic hernia without obstruction or gangrene: Secondary | ICD-10-CM

## 2018-12-16 DIAGNOSIS — I10 Essential (primary) hypertension: Secondary | ICD-10-CM | POA: Diagnosis not present

## 2018-12-16 MED ORDER — SODIUM CHLORIDE 0.9 % IV SOLN: 500.0000 mL | Freq: Once | INTRAVENOUS | Status: DC

## 2018-12-16 NOTE — Progress Notes (Signed)
1512 Pt experienced laryngeal spasm with jaw thrust  Performed, O2 sat down to mid 80s with returning to [place], vss

## 2018-12-16 NOTE — Op Note (Signed)
[facility] Patient Name: Terry Pacheco Procedure Date: 12/16/2018 3:05 PM MRN: [id number] Endoscopist: Mallie Mussel L. Loletha Carrow , MD Age: 68 Referring MD:  Date of Birth: [birth year] Gender: Male Account #: [id number] Procedure:                Colonoscopy Indications:              Screening for colorectal malignant neoplasm (last                            colonoscopy 2008) Medicines:                Monitored Anesthesia Care Procedure:                Pre-Anesthesia Assessment:                           - Prior to the procedure, a History and Physical                            was performed, and patient medications and                            allergies were reviewed. The patient's tolerance of                            previous anesthesia was also reviewed. The risks                            and benefits of the procedure and the sedation                            options and risks were discussed with the patient.                            All questions were answered, and informed consent                            was obtained. Prior Anticoagulants: The patient has                            taken no previous anticoagulant or antiplatelet                            agents except for aspirin. ASA Grade Assessment: II                            - A patient with mild systemic disease. After                            reviewing the risks and benefits, the patient was                            deemed in satisfactory condition to undergo the  procedure.                           After obtaining informed consent, the colonoscope                            was passed under direct vision. Throughout the                            procedure, the patient's blood pressure, pulse, and                            oxygen saturations were monitored continuously. The                            Colonoscope was introduced through the anus and   advanced to the the cecum, identified by                            appendiceal orifice and ileocecal valve. The                            colonoscopy was performed without difficulty. The                            patient tolerated the procedure well. The quality                            of the bowel preparation was fair, particularly in                            the left colon (PEG prep). The ileocecal valve,                            appendiceal orifice, and rectum were photographed. Scope In: 3:10:08 PM Scope Out: 3:24:00 PM Scope Withdrawal Time: 0 hours 9 minutes 29 seconds  Total Procedure Duration: 0 hours 13 minutes 52 seconds  Findings:                 The perianal and digital rectal examinations were                            normal.                           Many diverticula were found in the entire colon.                           A diminutive polyp was found in the ascending                            colon. The polyp was sessile. The polyp was removed                            with a cold biopsy forceps. Resection and retrieval  were complete.                           The exam was otherwise without abnormality on                            direct and retroflexion views. Complications:            No immediate complications. Estimated Blood Loss:     Estimated blood loss was minimal. Impression:               - Preparation of the colon was fair.                           - Diverticulosis in the entire examined colon.                           - One diminutive polyp in the ascending colon,                            removed with a cold biopsy forceps. Resected and                            retrieved.                           - The examination was otherwise normal on direct                            and retroflexion views. Recommendation:           - Patient has a contact number available for                            emergencies. The  signs and symptoms of potential                            delayed complications were discussed with the                            patient. Return to normal activities tomorrow.                            Written discharge instructions were provided to the                            patient.                           - Resume previous diet.                           - Continue present medications.                           - Await pathology results.                           -  Repeat colonoscopy (with 2-day prep) in 5 years                            for surveillance due to fair preparation.                           - See the other procedure note for documentation of                            additional recommendations. Nelton Amsden L. Loletha Carrow, MD 12/16/2018 3:44:15 PM This report has been signed electronically.

## 2018-12-16 NOTE — Patient Instructions (Signed)
HANDOUTS PROVIDED ON:  POLYPS & DIVERTICULOSIS  THE POLYPS AND BIOPSIES TAKEN TODAY HAVE BEEN SENT TO PATHOLOGY.  THE RESULTS WILL TAKE 2-3 WEEKS TO RECEIVE.    YOUR NEXT COLONOSCOPY SHOULD BE DONE IN 5 YEARS AND SHOULD INVOLVE A 2 DAY PREP.  YOU MAY RESUME YOUR PREVIOUS DIET AND MEDICATION SCHEDULE.  [place] YOU FOR ALLOWING [place] TO CARE FOR YOU TODAY!!  YOU HAD AN ENDOSCOPIC PROCEDURE TODAY AT [facility]:   Refer to the procedure report that was given to you for any specific questions about what was found during the examination.  If the procedure report does not answer your questions, please call your gastroenterologist to clarify.  If you requested that your care partner not be given the details of your procedure findings, then the procedure report has been included in a sealed envelope for you to review at your convenience later.  YOU SHOULD EXPECT: Some feelings of bloating in the abdomen. Passage of more gas than usual.  Walking can help get rid of the air that was put into your GI tract during the procedure and reduce the bloating. If you had a lower endoscopy (such as a colonoscopy or flexible sigmoidoscopy) you may notice spotting of blood in your stool or on the toilet paper. If you underwent a bowel prep for your procedure, you may not have a normal bowel movement for a few days.  Please Note:  You might notice some irritation and congestion in your nose or some drainage.  This is from the oxygen used during your procedure.  There is no need for concern and it should clear up in a day or so.  SYMPTOMS TO REPORT IMMEDIATELY:   Following lower endoscopy (colonoscopy or flexible sigmoidoscopy):  Excessive amounts of blood in the stool  Significant tenderness or worsening of abdominal pains  Swelling of the abdomen that is new, acute  Fever of 100F or higher   Following upper endoscopy (EGD)  Vomiting of blood or coffee ground material  New chest pain or pain under  the shoulder blades  Painful or persistently difficult swallowing  New shortness of breath  Fever of 100F or higher  Black, tarry-looking stools  For urgent or emergent issues, a gastroenterologist can be reached at any hour by calling [phone].   DIET:  We do recommend a small meal at first, but then you may proceed to your regular diet.  Drink plenty of fluids but you should avoid alcoholic beverages for 24 hours.  ACTIVITY:  You should plan to take it easy for the rest of today and you should NOT DRIVE or use heavy machinery until tomorrow (because of the sedation medicines used during the test).    FOLLOW UP: Our staff will call the number listed on your records 48-72 hours following your procedure to check on you and address any questions or concerns that you may have regarding the information given to you following your procedure. If we do not reach you, we will leave a message.  We will attempt to reach you two times.  During this call, we will ask if you have developed any symptoms of COVID 19. If you develop any symptoms (ie: fever, flu-like symptoms, shortness of breath, cough etc.) before then, please call [phone].  If you test positive for Covid 19 in the 2 weeks post procedure, please call and report this information to [place].    If any biopsies were taken you will be contacted by phone  or by letter within the next 1-3 weeks.  Please call us at [phone] if you have not heard about the biopsies in 3 weeks.    SIGNATURES/CONFIDENTIALITY: You and/or your care partner have signed paperwork which will be entered into your electronic medical record.  These signatures attest to the fact that that the information above on your After Visit Summary has been reviewed and is understood.  Full responsibility of the confidentiality of this discharge information lies with you and/or your care-partner.

## 2018-12-16 NOTE — Op Note (Signed)
[place] Patient Name: Terry Pacheco Procedure Date: 12/16/2018 3:04 PM MRN: [id number] Endoscopist: Mallie Mussel L. Loletha Carrow , MD Age: 68 Referring MD:  Date of Birth: [birth year] Gender: Male Account #: [id number] Procedure:                Upper GI endoscopy Indications:              Esophageal reflux, Screening for Barrett's                            esophagus (> 10 years reflux symptoms) Medicines:                Monitored Anesthesia Care Procedure:                Pre-Anesthesia Assessment:                           - Prior to the procedure, a History and Physical                            was performed, and patient medications and                            allergies were reviewed. The patient's tolerance of                            previous anesthesia was also reviewed. The risks                            and benefits of the procedure and the sedation                            options and risks were discussed with the patient.                            All questions were answered, and informed consent                            was obtained. Prior Anticoagulants: The patient has                            taken no previous anticoagulant or antiplatelet                            agents except for aspirin. ASA Grade Assessment: II                            - A patient with mild systemic disease. After                            reviewing the risks and benefits, the patient was                            deemed in satisfactory condition to undergo the  procedure.                           After obtaining informed consent, the endoscope was                            passed under direct vision. Throughout the                            procedure, the patient's blood pressure, pulse, and                            oxygen saturations were monitored continuously. The                            Endoscope was introduced through the mouth, and                    advanced to the second part of duodenum. The upper                            GI endoscopy was accomplished without difficulty.                            The patient tolerated the procedure well. Scope In: Scope Out: Findings:                 A 5 cm hiatal hernia was present.                           There is no endoscopic evidence of Barrett's                            esophagus or esophagitis in the entire esophagus.                           Localized erythematous mucosa was found in the                            prepyloric region of the stomach.                           Diffuse congested mucosa was found in the gastric                            body. Biopsies were taken with a cold forceps for                            histology. ([facility]ney protocol).                           The cardia and gastric fundus were normal on                            retroflexion.  The examined duodenum was normal. Complications:            No immediate complications. Estimated Blood Loss:     Estimated blood loss was minimal. Impression:               - 5 cm hiatal hernia.                           - Erythematous mucosa in the prepyloric region of                            the stomach.                           - Congestive gastropathy. Biopsied.                           - Normal examined duodenum. Recommendation:           - Patient has a contact number available for                            emergencies. The signs and symptoms of potential                            delayed complications were discussed with the                            patient. Return to normal activities tomorrow.                            Written discharge instructions were provided to the                            patient.                           - Resume previous diet.                           - Continue present medications.                           - Await pathology  results.                           - See the other procedure note for documentation of                            additional recommendations. Henry L. Loletha Carrow, MD 12/16/2018 3:48:35 PM This report has been signed electronically.

## 2018-12-16 NOTE — Progress Notes (Signed)
Called to room to assist during endoscopic procedure.  Patient ID and intended procedure confirmed with present staff. Received instructions for my participation in the procedure from the performing physician.  

## 2018-12-16 NOTE — Progress Notes (Signed)
Report given to PACU, vss 

## 2018-12-20 ENCOUNTER — Telehealth: Payer: Self-pay

## 2018-12-20 NOTE — Telephone Encounter (Signed)
  Follow up Call-  Call back number 12/16/2018  Post procedure Call Back phone  # [phone]  Permission to leave phone message Yes  Some recent data might be hidden     Patient questions:  Do you have a fever, pain , or abdominal swelling? No. Pain Score  0 *  Have you tolerated food without any problems? Yes.    Have you been able to return to your normal activities? Yes.    Do you have any questions about your discharge instructions: Diet   No. Medications  No. Follow up visit  No.  Do you have questions or concerns about your Care? No.  Actions: * If pain score is 4 or above: No action needed, pain <4.  1. Have you developed a fever since your procedure? no  2.   Have you had an respiratory symptoms (SOB or cough) since your procedure? no  3.   Have you tested positive for COVID 19 since your procedure no  4.   Have you had any family members/close contacts diagnosed with the COVID 19 since your procedure?  no   If yes to any of these questions please route to Joylene John, RN and Alphonsa Gin, Therapist, sports.

## 2018-12-23 ENCOUNTER — Encounter: Payer: Self-pay | Admitting: Gastroenterology

## 2018-12-24 ENCOUNTER — Other Ambulatory Visit: Payer: Self-pay | Admitting: Family Medicine

## 2018-12-28 ENCOUNTER — Ambulatory Visit (HOSPITAL_COMMUNITY)
Admission: RE | Admit: 2018-12-28 | Discharge: 2018-12-28 | Disposition: A | Discharge status: Home / Self Care | Payer: 59 | Source: Ambulatory Visit | Attending: Hematology | Admitting: Hematology

## 2018-12-28 ENCOUNTER — Other Ambulatory Visit: Payer: Self-pay

## 2018-12-28 DIAGNOSIS — J439 Emphysema, unspecified: Secondary | ICD-10-CM | POA: Diagnosis not present

## 2018-12-28 DIAGNOSIS — R911 Solitary pulmonary nodule: Secondary | ICD-10-CM | POA: Insufficient documentation

## 2019-01-04 DIAGNOSIS — H25011 Cortical age-related cataract, right eye: Secondary | ICD-10-CM | POA: Diagnosis not present

## 2019-01-04 DIAGNOSIS — H52211 Irregular astigmatism, right eye: Secondary | ICD-10-CM | POA: Diagnosis not present

## 2019-01-04 DIAGNOSIS — H2511 Age-related nuclear cataract, right eye: Secondary | ICD-10-CM | POA: Diagnosis not present

## 2019-01-04 DIAGNOSIS — H25811 Combined forms of age-related cataract, right eye: Secondary | ICD-10-CM | POA: Diagnosis not present

## 2019-01-07 ENCOUNTER — Other Ambulatory Visit: Payer: Self-pay | Admitting: Internal Medicine

## 2019-01-11 ENCOUNTER — Other Ambulatory Visit: Payer: Self-pay

## 2019-01-11 ENCOUNTER — Telehealth: Payer: Self-pay | Admitting: Hematology

## 2019-01-11 ENCOUNTER — Inpatient Hospital Stay: Payer: 59

## 2019-01-11 ENCOUNTER — Inpatient Hospital Stay: Payer: 59 | Attending: Hematology | Admitting: Hematology

## 2019-01-11 VITALS — BP 116/81 | HR 61 | Temp 98.0°F | Resp 18 | Ht 69.0 in | Wt 200.2 lb

## 2019-01-11 DIAGNOSIS — C099 Malignant neoplasm of tonsil, unspecified: Secondary | ICD-10-CM | POA: Insufficient documentation

## 2019-01-11 DIAGNOSIS — C090 Malignant neoplasm of tonsillar fossa: Secondary | ICD-10-CM

## 2019-01-11 DIAGNOSIS — R918 Other nonspecific abnormal finding of lung field: Secondary | ICD-10-CM | POA: Diagnosis not present

## 2019-01-11 LAB — CBC WITH DIFFERENTIAL/PLATELET
Abs Immature Granulocytes: 0.01 10*3/uL (ref 0.00–0.07)
Basophils Absolute: 0 10*3/uL (ref 0.0–0.1)
Basophils Relative: 1 %
Eosinophils Absolute: 0.4 10*3/uL (ref 0.0–0.5)
Eosinophils Relative: 6 %
HCT: 44.3 % (ref 39.0–52.0)
Hemoglobin: 15.1 g/dL (ref 13.0–17.0)
Immature Granulocytes: 0 %
Lymphocytes Relative: 20 %
Lymphs Abs: 1.1 10*3/uL (ref 0.7–4.0)
MCH: 30.5 pg (ref 26.0–34.0)
MCHC: 34.1 g/dL (ref 30.0–36.0)
MCV: 89.5 fL (ref 80.0–100.0)
Monocytes Absolute: 0.7 10*3/uL (ref 0.1–1.0)
Monocytes Relative: 12 %
Neutro Abs: 3.4 10*3/uL (ref 1.7–7.7)
Neutrophils Relative %: 61 %
Platelets: 155 10*3/uL (ref 150–400)
RBC: 4.95 MIL/uL (ref 4.22–5.81)
RDW: 12.6 % (ref 11.5–15.5)
WBC: 5.6 10*3/uL (ref 4.0–10.5)
nRBC: 0 % (ref 0.0–0.2)

## 2019-01-11 LAB — CMP (CANCER CENTER ONLY)
ALT: 20 U/L (ref 0–44)
AST: 17 U/L (ref 15–41)
Albumin: 4.1 g/dL (ref 3.5–5.0)
Alkaline Phosphatase: 126 U/L (ref 38–126)
Anion gap: 10 (ref 5–15)
BUN: 13 mg/dL (ref 8–23)
CO2: 22 mmol/L (ref 22–32)
Calcium: 9 mg/dL (ref 8.9–10.3)
Chloride: 104 mmol/L (ref 98–111)
Creatinine: 1.11 mg/dL (ref 0.61–1.24)
GFR, Est AFR Am: >60 mL/min (ref >60)
GFR, Estimated: >60 mL/min (ref >60)
Glucose, Bld: 98 mg/dL (ref 70–99)
Potassium: 4.8 mmol/L (ref 3.5–5.1)
Sodium: 136 mmol/L (ref 135–145)
Total Bilirubin: 0.5 mg/dL (ref 0.3–1.2)
Total Protein: 7.1 g/dL (ref 6.5–8.1)

## 2019-01-11 LAB — T4, FREE: Free T4: 0.83 ng/dL (ref 0.61–1.12)

## 2019-01-11 LAB — TSH: TSH: 4.252 u[IU]/mL — ABNORMAL HIGH (ref 0.320–4.118)

## 2019-01-11 NOTE — Progress Notes (Signed)
HEMATOLOGY/ONCOLOGY FOLLOW UP VISIT NOTE  Date of Service: 01/11/2019  Patient Care Team: Claretta Fraise, MD as PCP - General (Family Medicine) Jodi Marble, MD as Consulting Physician (Otolaryngology) Eppie Gibson, MD as Attending Physician (Radiation Oncology) Leota Sauers, RN as Oncology Nurse Navigator (Oncology) Karie Mainland, RD as Dietitian (Nutrition) Jomarie Longs, PT (Inactive) as Physical Therapist (Physical Therapy) Sharen Counter, CCC-SLP as Speech Language Pathologist (Speech Pathology) Kennith Center, LCSW as Social Worker  CHIEF COMPLAINTS/PURPOSE OF CONSULTATION:  F/u for tonsillar carcinoma Pulmonary nodule  HISTORY OF PRESENTING ILLNESS:   Terry Pacheco is a wonderful 68 y.o. male who has been referred to [place] by ENT Specialist, Dr. Erik Obey for evaluation and management of invasive squamous cell carcinoma of tonsil.   He has a PMHx of GERD, HTN, High cholesterol which he takes medications for. He denies PMHx of seizures. He is accompanied by his wife and sister today. He reports that he is doing well overall. The pt initially presented with sudden-onset right lateral tongue numbness that has been ongoing for approximately 1 year. Subsequently, he had left sided gum swelling and intermittent sore throat symptoms. He notes no stroke-like symptoms. He notes that following these symptoms, he was evaluated by multiple providers including an ENT specialist and a dentist prior to meeting with Dr. Erik Obey. Pt wife noted swelling and discoloration inside his mouth prior to the patient being evaluated by Dr. Erik Obey. At his first visit with Dr. Erik Obey, he noted an abnormality to his left tonsil and completed a biopsy that same day.   He has had CT soft tissue neck with contrast on 11/18/2016 with results showing: Fullness of the right palatine tonsil. Surrounding fat planes are ill-defined. This area is obscured by dental artifact. Possible right tonsillar  carcinoma.   The patient has had biopsy completed on 11/19/2016 with results of: "RIGHT FAUCIAL TONSIL", BIOPSY: Squamous cell carcinoma, suspicious for superficial invasion in this material. Immunostain p16 positive.   He also had a PET scan completed on 12/02/2016 with results revealing: IMPRESSION: 1. Mildly asymmetric tonsillar activity along the right tonsillar sinus, maximum SUV 7.7 as compared to the left side 5.2, suspicious in this setting for right-sided malignancy. There is also a mildly enlarged and hypermetabolic right level IIa lymph node with maximum SUV of 8.4. No evidence of other metastatic spread. 2. Faint accentuation of activity in the left lateral prostate gland apex, but low-grade. Correlate with PSA level in determining whether further workup is warranted. 3. Other imaging findings of potential clinical significance: Aortic Atherosclerosis (ICD10-I70.0) and Emphysema (ICD10-J43.9). Coronary atherosclerosis. Small type 1 hiatal hernia. Bilateral renal cysts. Colonic diverticulosis. Lastly, he had a MR Face trigeminal on 12/09/2016 with results of IMPRESSION: 1. No evidence of perineural spread of malignancy. 2. Mild asymmetry of the palatine tonsils without discrete mass. Correlate with direct visualization.   As far as surgeries, he has had a polyp on left sided throat that was removed 3-4 years ago that resulted negative for malignancy. He also has had a right hip replacement. Lastly, he has had a left inguinal hernia operation in 1978. He started smoking cigarettes at age 76 and would smoke 1 PPD while working. When he retired several years ago he smoked 0.5 PPD and has quit smoking cigarettes 1 month ago since his diagnosis. He states that he used a vape after quitting but was advised to quit via Dr. Isidore Moos. He occasionally consumes ETOH. 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 Denies allergies to medications at this time. He reports that  his father died of a stroke at age 14 and his mother died from cardiac issues. Mother had gastric cancer and was diagnosed in late 34's. Denies any other cancers of blood disorders in the family.    On review of systems, he denies hematuria, dysuria, or urinary retention. He reports urinary frequency over the past year. He reports bowel incontinence following a bowel movement x 1 year that has gradually improved more recently. He notes that his bowel incontinence has been intermittent. He has well formed stools without blood in stools or rectal bleeding. He denies increase in bowel movements and he has up to 1 bowel movement daily. He has had 2-3 occurrences of hemorrhoids with no recent flares. He denies mucous in his stools. He denies injuries or surgeries to his rectal area. He denies bladder incontinence. He has lower back pain that has been chronic and unchanged. He has a prior hx of diverticulitis approximately 10 years ago that was followed with colonoscopy and was then diagnosed with diverticulosis. He denies headache at this time. He is able to ambulate for prolonged periods without dyspnea on exertion. 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 He has had an audiogram completed by an ENT specialist in [place], [place]:  Surveillance   INTERIM HISTORY:   Terry Pacheco is called today for management and evaluation of his tonsillar cancer and pulmonary nodule. We are joined today by his wife, Mrs. Puca. The patient's last visit with [place] was on 07/27/2018. The pt reports that he is doing well overall.  The pt reports that he has been feeling well and very normal in the interim. He denies any issues with swallowing or throat discomfort. Pt last saw Dr. Erik Obey in August, they do not have a f/u scheduled at this time. He has been eating well and has been gaining weight. Pt notes that he has had some pain with urination which he  attributed to kidney stones. Pt had a fall in the yard a few months ago and hurt his right shoulder which is still sore. It is not limiting his joint movement or causing joint pain at this time. He can currently only drink water and milk as sodas and alcohol burn.   Of note since the patient's last visit, pt has had Chest CT ([id number]) completed on 12/28/2018 with results revealing "1. Stable exam. No change in the appearance of left apical nodular density which appears confluent with areas of pleuroparenchymal scarring and may be postinflammatory/infectious in etiology. 2. 2 mm right lower lobe lung nodule identified on prior exam is stable to improved in the interval. 3. Coronary artery atherosclerotic calcifications."  On review of systems, pt reports eating well, dysuria, right shoulder soreness and denies fevers, chills, skin rashes, swallowing issues, throat discomfort, unexpected weight loss, abdominal pain, flank pain and any other symptoms.   MEDICAL HISTORY:  Past Medical History:  Diagnosis Date   Aortic stenosis    Mild to moderate by echo 11/2016   Arthritis    Cancer ([place]) 10/2016   cancer of the tonsil/ 35 radiation treatments/4 doses of chemo/last radiatio 02/18/2017   GERD (gastroesophageal reflux disease) 07/06/2014   History of radiation therapy 12/30/2016- 02/18/2017   Right Tonsil and bilateral neck/ 70 Gy in 35 fractions to gross diseasae, 63 gy in  35 fractions to high risk nodal echelons, and 56 Gy in 35 fraction to intermediate risk nodal echelons.    Hypertension    Sleep apnea    does not use CPAP   Wears glasses     SURGICAL HISTORY: Past Surgical History:  Procedure Laterality Date   HERNIA REPAIR     right inguinal hernia   IR CV LINE INJECTION  01/14/2017   IR FLUORO GUIDE PORT INSERTION RIGHT  12/25/2016   IR GASTROSTOMY TUBE MOD SED  12/25/2016   IR GASTROSTOMY TUBE REMOVAL  06/05/2017   IR REMOVAL TUN ACCESS W/ PORT W/O FL MOD SED   03/06/2017   IR US GUIDE VASC ACCESS RIGHT  12/25/2016   LEFT HEART CATH AND CORONARY ANGIOGRAPHY N/A 12/07/2017   Procedure: LEFT HEART CATH AND CORONARY ANGIOGRAPHY;  Surgeon: Belva Crome, MD;  Location: [facility];  Service: Cardiovascular;  Laterality: N/A;   polyp removal  2008   during colonoscopy   TOTAL HIP ARTHROPLASTY Right 10/16/2015   Procedure: RIGHT TOTAL HIP ARTHROPLASTY ANTERIOR APPROACH;  Surgeon: Paralee Cancel, MD;  Location: [facility];  Service: Orthopedics;  Laterality: Right;   vocal cord polyp      removed 2-3 years ago    SOCIAL HISTORY: Social History   Socioeconomic History   Marital status: Married    Spouse name: Not on file   Number of children: 1   Years of education: Not on file   Highest education level: Not on file  Occupational History   Occupation: Retired     Fish farm manager: [employer]  Social Designer, fashion/clothing strain: Not on file   Food insecurity    Worry: Not on file    Inability: Not on file   Transportation needs    Medical: Not on file    Non-medical: Not on file  Tobacco Use   Smoking status: Former Smoker    Packs/day: 0.50    Years: 45.00    Pack years: 22.50    Types: Cigarettes    Start date: 01/12/1983    Quit date: 11/17/2016    Years since quitting: 2.1   Smokeless tobacco: Never Used  Substance and Sexual Activity   Alcohol use: Not Currently    Alcohol/week: 0.0 standard drinks   Drug use: Not Currently    Types: Marijuana    Comment: last use 10/09/2015   Sexual activity: Not on file  Lifestyle   Physical activity    Days per week: Not on file    Minutes per session: Not on file   Stress: Not on file  Relationships   Social connections    Talks on phone: Not on file    Gets together: Not on file    Attends religious service: Not on file    Active member of club or organization: Not on file    Attends meetings of clubs or organizations: Not on file    Relationship status:  Not on file   Intimate partner violence    Fear of current or ex partner: Not on file    Emotionally abused: Not on file    Physically abused: Not on file    Forced sexual activity: Not on file  Other Topics Concern   Not on file  Social History Narrative   Not on file    FAMILY HISTORY: Family History  Problem Relation Age of Onset   Heart disease Mother    Stroke Father  Colon cancer Neg Hx    Esophageal cancer Neg Hx    Pancreatic cancer Neg Hx    Stomach cancer Neg Hx    Liver cancer Neg Hx     ALLERGIES:  is allergic to crestor [rosuvastatin].  MEDICATIONS:  Current Outpatient Medications  Medication Sig Dispense Refill   acetaminophen (TYLENOL) 500 MG tablet Take 1,000 mg by mouth daily as needed for moderate pain or headache.     amLODipine (NORVASC) 10 MG tablet TAKE 1 TABLET EVERY DAY FOR FOR BLOOD PRESSURE 90 tablet 1   aspirin EC 81 MG tablet Take 81 mg by mouth daily.     atorvastatin (LIPITOR) 40 MG tablet TAKE 1 TABLET BY MOUTH EVERY DAY 90 tablet 1   calcium carbonate (TUMS - DOSED IN MG ELEMENTAL CALCIUM) 500 MG chewable tablet Chew 2 tablets by mouth daily as needed for indigestion or heartburn.     escitalopram (LEXAPRO) 20 MG tablet TAKE 1/2 TABLET BY MOUTH EVERY DAY 45 tablet 0   ezetimibe (ZETIA) 10 MG tablet Take 1 tablet (10 mg total) by mouth daily. Please schedule appt for further refills 1st attempt 90 tablet 0   fluticasone (FLONASE) 50 MCG/ACT nasal spray SPRAY 2 SPRAYS INTO EACH NOSTRIL EVERY DAY (Patient not taking: No sig reported) 48 g 0   ketorolac (ACULAR) 0.5 % ophthalmic solution INSTILL 1 DROP INTO THE LEFT EYE 4 TIMES A DAY . START 72 HOURS PRIOR TO SURGERY     lidocaine (XYLOCAINE) 2 % solution Mix 1 part 2%viscous lidocaine,1part H2O.Swish and swallow 86mL of this mixture, 84min before meals and at bedtime, up to QID (Patient not taking: Reported on 12/16/2018) 100 mL 5   Liniments (SALONPAS PAIN RELIEF PATCH EX)  Apply 1 patch topically daily as needed (pain).     ofloxacin (OCUFLOX) 0.3 % ophthalmic solution INSTILL 1 DROP INTO BOTH EYES 4 TIMES DAILY. START 72 HOURS PRIOR TO SURGERY.     omeprazole (PRILOSEC) 20 MG capsule TAKE 1 CAPSULE BY MOUTH EVERY DAY 90 capsule 0   prednisoLONE acetate (PRED FORTE) 1 % ophthalmic suspension APPLY 1 DROP IN LEFT EYE 4 TIMES A DAY. USE ONLY AFTER SURGERY.     sodium fluoride (FLUORISHIELD) 1.1 % GEL dental gel Instill one drop of gel into each tooth space of fluoride tray. Place over teeth for 5 minutes. Remove. Spit out excess. Repeat nightly (Patient not taking: Reported on 12/16/2018) 120 mL prn   No current facility-administered medications for this visit.     REVIEW OF SYSTEMS:   A 10+ POINT REVIEW OF SYSTEMS WAS OBTAINED including neurology, dermatology, psychiatry, cardiac, respiratory, lymph, extremities, GI, GU, Musculoskeletal, constitutional, breasts, reproductive, HEENT.  All pertinent positives are noted in the HPI.  All others are negative.   PHYSICAL EXAMINATION: ECOG PERFORMANCE STATUS: 1 - Symptomatic but completely ambulatory Vitals:   01/11/19 1229  BP: 116/81  Pulse: 61  Resp: 18  Temp: 98 F (36.7 C)  TempSrc: Temporal  SpO2: 96%  Weight: 200 lb 3.2 oz (90.8 kg)  Height: 5\' 9"  (1.753 m)  .   GENERAL:alert, in no acute distress and comfortable SKIN: no acute rashes, no significant lesions EYES: conjunctiva are pink and non-injected, sclera anicteric OROPHARYNX: MMM, no exudates, no oropharyngeal erythema or ulceration NECK: supple, no JVD LYMPH:  no palpable lymphadenopathy in the cervical, axillary or inguinal regions LUNGS: clear to auscultation b/l with normal respiratory effort HEART: regular rate & rhythm ABDOMEN:  normoactive bowel sounds , non  tender, not distended. No palpable hepatosplenomegaly.  Extremity: no pedal edema PSYCH: alert & oriented x 3 with fluent speech NEURO: no focal motor/sensory  deficits  LABORATORY DATA:  I have reviewed the data as listed   CBC Latest Ref Rng & Units 01/11/2019 07/09/2018 04/15/2018  WBC 4.0 - 10.5 K/uL 5.6 6.0 6.0  Hemoglobin 13.0 - 17.0 g/dL 15.1 14.4 15.7  Hematocrit 39.0 - 52.0 % 44.3 43.6 45.9  Platelets 150 - 400 K/uL 155 130(L) 158     . CMP Latest Ref Rng & Units 01/11/2019 07/09/2018 04/15/2018  Glucose 70 - 99 mg/dL 98 101(H) 98  BUN 8 - 23 mg/dL 13 20 20   Creatinine 0.61 - 1.24 mg/dL 1.11 1.03 1.19  Sodium 135 - 145 mmol/L 136 135 135  Potassium 3.5 - 5.1 mmol/L 4.8 4.5 4.6  Chloride 98 - 111 mmol/L 104 104 97  CO2 22 - 32 mmol/L 22 23 25   Calcium 8.9 - 10.3 mg/dL 9.0 8.9 9.5  Total Protein 6.5 - 8.1 g/dL 7.1 6.9 6.9  Total Bilirubin 0.3 - 1.2 mg/dL 0.5 0.6 0.4  Alkaline Phos 38 - 126 U/L 126 122 143(H)  AST 15 - 41 U/L 17 18 12   ALT 0 - 44 U/L 20 17 16    PATHOLOGY:    RADIOGRAPHIC STUDIES: I have personally reviewed the radiological images as listed and agreed with the findings in the report.  Ct Chest Wo Contrast  Result Date: 12/28/2018 CLINICAL DATA:  Evaluate solitary pulmonary nodule. History of tonsillar carcinoma. EXAM: CT CHEST WITHOUT CONTRAST TECHNIQUE: Multidetector CT imaging of the chest was performed following the standard protocol without IV contrast. COMPARISON:  03/23/2018 FINDINGS: Cardiovascular: The heart size is normal. Aortic atherosclerosis. Three vessel coronary artery atherosclerotic calcifications. Mediastinum/Nodes: No enlarged mediastinal or axillary lymph nodes. Small hiatal hernia identified. The trachea appears patent and is midline. Normal appearance of the thyroid gland. Small hiatal hernia. Lungs/Pleura: Centrilobular and paraseptal emphysema. Biapical pleuroparenchymal scarring noted. The nodular density within the left apex is unchanged measuring 1.3 by 1.1 cm, image 24/5. As mentioned previously this appears confluent with the pleuroparenchymal scarring in the left apex and is likely post  infectious or inflammatory in etiology. 2 mm right lower lobe lung nodule is stable to decreased in the interval, image 105/5. Calcified granuloma is noted in the right upper lobe, image 86/5. Upper Abdomen: No acute abnormality.  Left kidney stone. Musculoskeletal: No pectus deformity involving the sternum. No acute or suspicious osseous findings. Thoracic spondylosis noted. IMPRESSION: 1. Stable exam. No change in the appearance of left apical nodular density which appears confluent with areas of pleuroparenchymal scarring and may be postinflammatory/infectious in etiology. 2. 2 mm right lower lobe lung nodule identified on prior exam is stable to improved in the interval. 3. Coronary artery atherosclerotic calcifications. Aortic Atherosclerosis (ICD10-I70.0) and Emphysema (ICD10-J43.9). Electronically Signed   By: Kerby Moors M.D.   On: 12/28/2018 14:33    ASSESSMENT & PLAN:   68 y.o. 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 -creatinine WNL. Some concern for decreased hearing and baseline tinnitus due to previous job noise related injury. Baseline audiogram showed normal symmetric low-frequency hearing, dropping off substantially above 2000Hz .  s/p recent completion of concurrent chemo-radiation with weekly Cisplatin,  12/30/2016 - 02/18/2017  He stopped tube feeding 04/16/17. He is eating by mouth now.  04/30/16 PET which shows no enlarged or hypermetabolic activity in cervical lymph nodes and no residual primary focus of head and neck disease. Has new hypermetabolic nodule in upper lobe in left lung. This favors inflammatory process.  12/08/17 PET/CT revealed There is a new focus of abnormal asymmetric uptake within the right side of tongue within SUV max of  6.65. Suspicious for recurrence of disease versus new site of disease. 2. No significant FDG uptake associated with the irregular nodule within the left lung apex which may be postinflammatory in etiology  01/13/18 MRI Neck revealed No evident mass to explain PET findings, which may have been muscular. 2. Negative for nodal disease.  2. Pulmonary nodule  Initially seen on 04/30/16 PET/CT  06/17/17 CT Chest shows stable left lung nodule, with no new developments, overall stable. Will continue to monitor closely.    09/09/17 CT Chest revealed Stable to decrease in size irregular pulmonary nodule in the posterior left lung apex, hypermetabolic on PET-CT from [id number]. Aortic Atherosclerosis  and Emphysema. Multi vessel coronary artery atherosclerotic calcifications.   12/15/17 CT Chest revealed Stable appearing left apical density, likely scar tissue given the surrounding changes. This was mildly hypermetabolic on the PET-CT from 04/29/2017 but not hypermetabolic on the more recent PET-CT from 12/08/2017. Recommend follow-up noncontrast chest CT in 6 months. 2. No enlarged mediastinal or hilar lymph nodes. 3. Stable advanced emphysematous changes and apical pulmonary scarring. No new/acute pulmonary findings. 4. Stable advanced atherosclerotic calcifications involving the thoracic aorta and branch vessels.  03/23/18 CT Chest revealed "No change in the appearance of left apical nodular density which appears confluent with areas of apical pleuroparenchymal scarring and may be postinflammatory/infectious in etiology. 2. New tiny nodule within the right lower lobe measuring 2 mm is nonspecific. Attention on follow-up imaging is advised. 3. Aortic Atherosclerosis and Emphysema 4. Multi vessel coronary artery atherosclerotic calcifications".  07/14/18 CT Neck revealed "No mass or lymphadenopathy identified. NIRADS category 1, routine surveillance. 2. Low-density mucosal thickening of the oral and hypopharynx,  fatty replacement of submandibular glands, and non masslike fat stranding within submandibular/parapharyngeal compartments are compatible with posttreatment changes. 3. Stable left lung apex pulmonary nodule."  Plan: -Will get labs today  -The pt shows no clinical or lab progression/return of his tonsillar cancer at this time.  -No indication for further treatment at this time. -Will rpt Chest CT in 1 year  -Will monitor pt with repeat CT imaging and clinical observation every 3 months by myself or ENT Dr. Erik Obey  -Advised pt to continue f/u with Dr. Erik Obey for local surveillance in next 2-3 months -Will see back in 6 months with a lab  TSH borderline elevated at 4.252 but free t 4 wnl at 0.83 ---- will monitor for evolving hypothyroidism FOLLOW UP: Labs today RTC with Dr Irene Limbo in 6 months with labs  The total time spent in the appt was 20 minutes and more than 50% was on counseling and direct patient cares.  All of the patient's questions were answered with apparent satisfaction. The patient knows to call the clinic with any problems, questions or concerns.  Sullivan Lone MD [place] AAHIVMS [facility] Hematology/Oncology Physician [facility]  (Office):       [phone] (Work cell):  [phone] (Fax):           [phone]  01/11/2019 5:51 PM  I, Yevette Edwards, am acting as a  scribe for Dr. Sullivan Lone.   .I have reviewed the above documentation for accuracy and completeness, and I agree with the above. Brunetta Genera MD

## 2019-01-11 NOTE — Telephone Encounter (Signed)
Scheduled appt per 11/17 los.  Left a VM of the appt date and time.

## 2019-02-07 ENCOUNTER — Other Ambulatory Visit (HOSPITAL_COMMUNITY): Payer: 59

## 2019-02-10 ENCOUNTER — Other Ambulatory Visit: Payer: Self-pay | Admitting: Family Medicine

## 2019-02-10 NOTE — Telephone Encounter (Signed)
Ov 04/18/19

## 2019-02-16 ENCOUNTER — Other Ambulatory Visit: Payer: Self-pay

## 2019-02-16 ENCOUNTER — Ambulatory Visit (HOSPITAL_COMMUNITY): Payer: 59 | Attending: Cardiology

## 2019-02-16 DIAGNOSIS — I350 Nonrheumatic aortic (valve) stenosis: Secondary | ICD-10-CM | POA: Insufficient documentation

## 2019-02-22 ENCOUNTER — Other Ambulatory Visit: Payer: Self-pay

## 2019-02-22 ENCOUNTER — Encounter: Payer: Self-pay | Admitting: Internal Medicine

## 2019-02-22 ENCOUNTER — Ambulatory Visit (INDEPENDENT_AMBULATORY_CARE_PROVIDER_SITE_OTHER): Payer: 59 | Admitting: Internal Medicine

## 2019-02-22 VITALS — BP 133/83 | HR 68 | Temp 98.1°F | Ht 69.0 in | Wt 204.6 lb

## 2019-02-22 DIAGNOSIS — E785 Hyperlipidemia, unspecified: Secondary | ICD-10-CM

## 2019-02-22 DIAGNOSIS — I350 Nonrheumatic aortic (valve) stenosis: Secondary | ICD-10-CM

## 2019-02-22 DIAGNOSIS — I10 Essential (primary) hypertension: Secondary | ICD-10-CM | POA: Diagnosis not present

## 2019-02-22 NOTE — Progress Notes (Signed)
OFFICE CONSULT NOTE  Chief Complaint:  Follow-up  Primary Care Physician: Claretta Fraise, MD  HPI:  Terry Pacheco is a 68 y.o. male who is being seen today for the evaluation of aortic murmur at the request of Claretta Fraise, MD.  This is a pleasant 69 year old male who is kindly referred to evaluate an aortic murmur.  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  He was also noted to have systolic murmur and found to have aortic stenosis which is mild to moderate by echo in October 2018, with normal LV function.  Other medical problems include hypertension, GERD, dyslipidemia and strong family history of heart disease both in his mother as well as stroke in his father.  As part of ongoing work-up of his cancer he was found to have a lung nodule.  He is been followed by Dr. Roxan Hockey for this.  A CT scan demonstrated some recent decrease in the size of the lung nodule but he was noted to have multivessel coronary artery calcification and aortic atherosclerosis.  He reports he had stress testing however it was more than 10 years ago and was a treadmill stress test.  He also carries a diagnosis of sleep apnea but does not use CPAP.  He was endorsing significant fatigue while undergoing radiation therapy but reports he is slowly getting his exercise capacity back.  He denies any chest pain or significant worsening shortness of breath.  12/03/2017  [name] is seen today in follow-up.  He underwent echocardiography as well as a myocardial perfusion stress testing.  The echocardiogram did show moderate aortic stenosis with an increase mean gradient from 19 to 22 mmHg compared to his prior study.  Stress testing indicated LVEF of 57% however there was a small defect of moderate severity in the basal inferior mid inferior walls which is very reversible and "worrisome for ischemia".  This study was intermediate risk.  I reviewed the results with  him today.  He reports fatigue but thinks it is getting somewhat better.  He denies any chest pain or shortness of breath, but his wife noticed that he does get short of breath with exertion still.  This was all attributed to radiotherapy.  It is difficult for them to determine if his symptoms were related to his cancer treatment or something else.  Was scheduled to undergo GI evaluation by Dr. Loletha Carrow, however given the abnormal stress test, I would prefer to determine if he has any obstructive coronary disease prior to this.  12/30/2017  [name] returns today for follow-up of heart cath.  This was performed on 12/07/2017 which showed mild nonobstructive coronary disease.  He was also felt to have mild aortic stenosis with a peak to peak gradient of 26 mmHg.  He does note some improvement in his shortness of breath.  Is not had GI evaluation yet but would be considered at acceptable risk for that work-up.  02/22/2019  [name] is seen today for follow-up.  He had a repeat echo on December 23 which showed a fairly stable moderate aortic stenosis.  LVEF was normal.  He denies any chest pain or worsening shortness of breath.  As previously mentioned his cath showed no obstructive coronary disease in October 2019.  I had added ezetimibe to his medications and repeat labs in February 2020 showed total cholesterol of 118, HDL 44, LDL 59 and triglycerides of 75.  PMHx:  Past Medical History:  Diagnosis Date  .  Aortic stenosis    Mild to moderate by echo 11/2016  . Arthritis   . Cancer ([place]) 10/2016   cancer of the tonsil/ 35 radiation treatments/4 doses of chemo/last radiatio 02/18/2017  . GERD (gastroesophageal reflux disease) 07/06/2014  . History of radiation therapy 12/30/2016- 02/18/2017   Right Tonsil and bilateral neck/ 70 Gy in 35 fractions to gross diseasae, 63 gy in 35 fractions to high risk nodal echelons, and 56 Gy in 35 fraction to intermediate risk nodal echelons.   . Hypertension   .  Sleep apnea    does not use CPAP  . Wears glasses     Past Surgical History:  Procedure Laterality Date  . HERNIA REPAIR     right inguinal hernia  . IR CV LINE INJECTION  01/14/2017  . IR FLUORO GUIDE PORT INSERTION RIGHT  12/25/2016  . IR GASTROSTOMY TUBE MOD SED  12/25/2016  . IR GASTROSTOMY TUBE REMOVAL  06/05/2017  . IR REMOVAL TUN ACCESS W/ PORT W/O FL MOD SED  03/06/2017  . IR US GUIDE VASC ACCESS RIGHT  12/25/2016  . LEFT HEART CATH AND CORONARY ANGIOGRAPHY N/A 12/07/2017   Procedure: LEFT HEART CATH AND CORONARY ANGIOGRAPHY;  Surgeon: Belva Crome, MD;  Location: [facility];  Service: Cardiovascular;  Laterality: N/A;  . polyp removal  2008   during colonoscopy  . TOTAL HIP ARTHROPLASTY Right 10/16/2015   Procedure: RIGHT TOTAL HIP ARTHROPLASTY ANTERIOR APPROACH;  Surgeon: Paralee Cancel, MD;  Location: [facility];  Service: Orthopedics;  Laterality: Right;  . vocal cord polyp      removed 2-3 years ago     FAMHx:  Family History  Problem Relation Age of Onset  . Heart disease Mother   . Stroke Father   . Colon cancer Neg Hx   . Esophageal cancer Neg Hx   . Pancreatic cancer Neg Hx   . Stomach cancer Neg Hx   . Liver cancer Neg Hx     SOCHx:   reports that he quit smoking about 2 years ago. His smoking use included cigarettes. He started smoking about 36 years ago. He has a 22.50 pack-year smoking history. He has never used smokeless tobacco. He reports previous alcohol use. He reports previous drug use. Drug: Marijuana.  ALLERGIES:  Allergies  Allergen Reactions  . Crestor [Rosuvastatin]     Muscle aches    ROS: Pertinent items noted in HPI and remainder of comprehensive ROS otherwise negative.  HOME MEDS: Current Outpatient Medications on File Prior to Visit  Medication Sig Dispense Refill  . acetaminophen (TYLENOL) 500 MG tablet Take 1,000 mg by mouth daily as needed for moderate pain or headache.    Marland Kitchen amLODipine (NORVASC) 10 MG tablet TAKE 1 TABLET  EVERY DAY FOR FOR BLOOD PRESSURE 90 tablet 1  . aspirin EC 81 MG tablet Take 81 mg by mouth daily.    Marland Kitchen atorvastatin (LIPITOR) 40 MG tablet TAKE 1 TABLET BY MOUTH EVERY DAY 90 tablet 1  . calcium carbonate (TUMS - DOSED IN MG ELEMENTAL CALCIUM) 500 MG chewable tablet Chew 2 tablets by mouth daily as needed for indigestion or heartburn.    . escitalopram (LEXAPRO) 20 MG tablet TAKE 1/2 TABLET BY MOUTH EVERY DAY 45 tablet 0  . ezetimibe (ZETIA) 10 MG tablet Take 1 tablet (10 mg total) by mouth daily. Please schedule appt for further refills 1st attempt 90 tablet 0  . fluticasone (FLONASE) 50 MCG/ACT nasal spray SPRAY 2 SPRAYS INTO [facility]  NOSTRIL EVERY DAY (Patient not taking: No sig reported) 48 g 0  . ketorolac (ACULAR) 0.5 % ophthalmic solution INSTILL 1 DROP INTO THE LEFT EYE 4 TIMES A DAY . START 72 HOURS PRIOR TO SURGERY    . lidocaine (XYLOCAINE) 2 % solution Mix 1 part 2%viscous lidocaine,1part H2O.Swish and swallow 8mL of this mixture, 93min before meals and at bedtime, up to QID (Patient not taking: Reported on 12/16/2018) 100 mL 5  . Liniments (SALONPAS PAIN RELIEF PATCH EX) Apply 1 patch topically daily as needed (pain).    Marland Kitchen ofloxacin (OCUFLOX) 0.3 % ophthalmic solution INSTILL 1 DROP INTO BOTH EYES 4 TIMES DAILY. START 72 HOURS PRIOR TO SURGERY.    Marland Kitchen omeprazole (PRILOSEC) 20 MG capsule TAKE 1 CAPSULE BY MOUTH EVERY DAY 90 capsule 0  . prednisoLONE acetate (PRED FORTE) 1 % ophthalmic suspension APPLY 1 DROP IN LEFT EYE 4 TIMES A DAY. USE ONLY AFTER SURGERY.    . sodium fluoride (FLUORISHIELD) 1.1 % GEL dental gel Instill one drop of gel into each tooth space of fluoride tray. Place over teeth for 5 minutes. Remove. Spit out excess. Repeat nightly (Patient not taking: Reported on 12/16/2018) 120 mL prn   No current facility-administered medications on file prior to visit.    LABS/IMAGING: No results found for this or any previous visit (from the past 48 hour(s)). No results found.   LIPID PANEL:    Component Value Date/Time   CHOL 118 04/15/2018 1119   TRIG 75 04/15/2018 1119   TRIG 177 (H) 03/14/2013 1053   HDL 44 04/15/2018 1119   HDL 34 (L) 03/14/2013 1053   CHOLHDL 2.7 04/15/2018 1119   LDLCALC 59 04/15/2018 1119   LDLCALC 55 03/14/2013 1053    WEIGHTS: Wt Readings from Last 3 Encounters:  02/22/19 204 lb 9.6 oz (92.8 kg)  01/11/19 200 lb 3.2 oz (90.8 kg)  12/16/18 199 lb (90.3 kg)    VITALS: BP 133/83   Pulse 68   Temp 98.1 F (36.7 C)   Ht 5\' 9"  (1.753 m)   Wt 204 lb 9.6 oz (92.8 kg)   SpO2 94%   BMI 30.21 kg/m   EXAM: General appearance: alert and no distress Neck: no carotid bruit, no JVD and thyroid not enlarged, symmetric, no tenderness/mass/nodules Lungs: clear to auscultation bilaterally Heart: regular rate and rhythm, S1, S2 normal and systolic murmur: late systolic 3/6, blowing at 2nd right intercostal space Abdomen: soft, non-tender; bowel sounds normal; no masses,  no organomegaly Extremities: extremities normal, atraumatic, no cyanosis or edema Pulses: 2+ and symmetric Skin: Skin color, texture, turgor normal. No rashes or lesions Neurologic: Grossly normal Psych: Pleasant  EKG: Sinus rhythm with PVCs and nonspecific ST changes at 68-personally reviewed  ASSESSMENT: 1. Moderate aortic stenosis (01/2019) 2. Mild nonobstructive coronary disease by cath (11/2017) 3. Strong family history of premature coronary artery disease 4. Recent chemotherapy and radiation for tonsillar cancer 5. Hypertension 6. Dyslipidemia  PLAN: 1.   Mr. Mifsud has moderate aortic stenosis which has been stable by echo over the past year.  He denies any new symptoms and had very minimal coronary disease by cath last year.  Blood pressure has been controlled and his cholesterol now is at goal with LDL less than 70.  No further changes to his medicines today.  Follow-up with me annually or sooner as necessary.  Pixie Casino, MD, [facility], [place] Director of the Advanced Lipid Disorders &  Cardiovascular Risk Reduction Clinic Diplomate of the American Board of Clinical Lipidology Attending Cardiologist  Direct Dial: [phone]  Fax: [phone]  Website:  [email or web address] 02/22/2019, 3:25 PM

## 2019-02-22 NOTE — Patient Instructions (Signed)
Medication Instructions:  NO CHANGES  *If you need a refill on your cardiac medications before your next appointment, please call your pharmacy*  Lab Work:  If you have labs (blood work) drawn today and your tests are completely normal, you will receive your results only by: Marland Kitchen MyChart Message (if you have MyChart) OR . A paper copy in the mail If you have any lab test that is abnormal or we need to change your treatment, we will call you to review the results.  Testing/Procedures: REPEAT ECHO IN 1 YEAR Your physician has requested that you have an echocardiogram. Echocardiography is a painless test that uses sound waves to create images of your heart. It provides your doctor with information about the size and shape of your heart and how well your heart's chambers and valves are working. This procedure takes approximately one hour. There are no restrictions for this procedure.  [address], [address]  Follow-Up: At [facility], you and your health needs are our priority.  As part of our continuing mission to provide you with exceptional heart care, we have created designated Provider Care Teams.  These Care Teams include your primary Cardiologist (physician) and Advanced Practice Providers (APPs -  Physician Assistants and Nurse Practitioners) who all work together to provide you with the care you need, when you need it.  Your next appointment:   12 month(s)  The format for your next appointment:   Either In Person or Virtual  Provider:   Raliegh Ip Mali Hilty, MD  Other Instructions

## 2019-03-07 ENCOUNTER — Other Ambulatory Visit: Payer: Self-pay | Admitting: Family Medicine

## 2019-03-10 DIAGNOSIS — Z7289 Other problems related to lifestyle: Secondary | ICD-10-CM | POA: Diagnosis not present

## 2019-03-10 DIAGNOSIS — Z9221 Personal history of antineoplastic chemotherapy: Secondary | ICD-10-CM | POA: Diagnosis not present

## 2019-03-10 DIAGNOSIS — Z85818 Personal history of malignant neoplasm of other sites of lip, oral cavity, and pharynx: Secondary | ICD-10-CM | POA: Diagnosis not present

## 2019-03-10 DIAGNOSIS — Z923 Personal history of irradiation: Secondary | ICD-10-CM | POA: Diagnosis not present

## 2019-03-10 DIAGNOSIS — Z87891 Personal history of nicotine dependence: Secondary | ICD-10-CM | POA: Diagnosis not present

## 2019-03-10 DIAGNOSIS — H6123 Impacted cerumen, bilateral: Secondary | ICD-10-CM | POA: Diagnosis not present

## 2019-03-15 ENCOUNTER — Ambulatory Visit: Payer: 59 | Attending: Internal Medicine

## 2019-03-15 DIAGNOSIS — Z23 Encounter for immunization: Secondary | ICD-10-CM | POA: Insufficient documentation

## 2019-03-15 NOTE — Progress Notes (Signed)
   Covid-19 Vaccination Clinic  Name:  Terry Pacheco    MRN: [id number] DOB: Jul 23, 1950  03/15/2019  Terry Pacheco was observed post Covid-19 immunization for 15 minutes without incidence. He was provided with Vaccine Information Sheet and instruction to access the V-Safe system.   Terry Pacheco was instructed to call 911 with any severe reactions post vaccine: Marland Kitchen Difficulty breathing  . Swelling of your face and throat  . A fast heartbeat  . A bad rash all over your body  . Dizziness and weakness    Immunizations Administered    Name Date Dose VIS Date Route   Pfizer COVID-19 Vaccine 03/15/2019 12:50 PM 0.3 mL 02/04/2019 Intramuscular   Manufacturer: [place]   Lot: [id number]   [place]: [id number]

## 2019-03-17 ENCOUNTER — Other Ambulatory Visit: Payer: Self-pay | Admitting: Family Medicine

## 2019-03-17 NOTE — Telephone Encounter (Signed)
Pt has f/u appt with PCP 04/18/19

## 2019-04-04 ENCOUNTER — Ambulatory Visit: Payer: 59 | Attending: Internal Medicine

## 2019-04-04 DIAGNOSIS — Z23 Encounter for immunization: Secondary | ICD-10-CM | POA: Insufficient documentation

## 2019-04-04 NOTE — Progress Notes (Signed)
   Covid-19 Vaccination Clinic  Name:  Terry Pacheco    MRN: [id number] DOB: June 24, 1950  04/04/2019  Terry Pacheco was observed post Covid-19 immunization for 15 minutes without incidence. He was provided with Vaccine Information Sheet and instruction to access the V-Safe system.   Terry Pacheco was instructed to call 911 with any severe reactions post vaccine: Marland Kitchen Difficulty breathing  . Swelling of your face and throat  . A fast heartbeat  . A bad rash all over your body  . Dizziness and weakness    Immunizations Administered    Name Date Dose VIS Date Route   Pfizer COVID-19 Vaccine 04/04/2019 12:48 PM 0.3 mL 02/04/2019 Intramuscular   Manufacturer: [place]   Lot: [id number]   [place]: [id number]

## 2019-04-06 ENCOUNTER — Other Ambulatory Visit: Payer: Self-pay | Admitting: Internal Medicine

## 2019-04-13 ENCOUNTER — Ambulatory Visit: Payer: 59

## 2019-04-15 ENCOUNTER — Other Ambulatory Visit: Payer: Self-pay

## 2019-04-18 ENCOUNTER — Other Ambulatory Visit: Payer: Self-pay

## 2019-04-18 ENCOUNTER — Ambulatory Visit (INDEPENDENT_AMBULATORY_CARE_PROVIDER_SITE_OTHER): Payer: 59 | Admitting: Family Medicine

## 2019-04-18 ENCOUNTER — Encounter: Payer: Self-pay | Admitting: Family Medicine

## 2019-04-18 VITALS — BP 127/82 | HR 68 | Temp 98.4°F | Ht 69.0 in | Wt 207.0 lb

## 2019-04-18 DIAGNOSIS — F411 Generalized anxiety disorder: Secondary | ICD-10-CM

## 2019-04-18 DIAGNOSIS — Z125 Encounter for screening for malignant neoplasm of prostate: Secondary | ICD-10-CM

## 2019-04-18 DIAGNOSIS — D485 Neoplasm of uncertain behavior of skin: Secondary | ICD-10-CM

## 2019-04-18 DIAGNOSIS — K219 Gastro-esophageal reflux disease without esophagitis: Secondary | ICD-10-CM | POA: Diagnosis not present

## 2019-04-18 DIAGNOSIS — E785 Hyperlipidemia, unspecified: Secondary | ICD-10-CM

## 2019-04-18 DIAGNOSIS — I10 Essential (primary) hypertension: Secondary | ICD-10-CM

## 2019-04-18 MED ORDER — AMLODIPINE BESYLATE 10 MG PO TABS: ORAL_TABLET | ORAL | 1 refills | Status: DC

## 2019-04-18 MED ORDER — ESCITALOPRAM OXALATE 20 MG PO TABS: 10.0000 mg | ORAL_TABLET | Freq: Every day | ORAL | 1 refills | Status: DC

## 2019-04-18 MED ORDER — OMEPRAZOLE 20 MG PO CPDR: 20.0000 mg | DELAYED_RELEASE_CAPSULE | Freq: Every day | ORAL | 1 refills | Status: DC

## 2019-04-18 MED ORDER — ATORVASTATIN CALCIUM 40 MG PO TABS: 40.0000 mg | ORAL_TABLET | Freq: Every day | ORAL | 1 refills | Status: DC

## 2019-04-18 NOTE — Progress Notes (Signed)
Subjective:  Patient ID: Terry Pacheco, male    DOB: 1950/05/23  Age: 69 y.o. MRN: [id number]  CC: Follow-up (6 month)   HPI Terry Pacheco presents for  follow-up of hypertension. Patient has no history of headache chest pain or shortness of breath or recent cough. Patient also denies symptoms of TIA such as focal numbness or weakness. Patient denies side effects from medication. States taking it regularly.  Patient in for follow-up of GERD. Currently asymptomatic taking  PPI daily. There is no chest pain or heartburn. No hematemesis and no melena. No dysphagia or choking. Onset is remote. Progression is stable. Complicating factors, none. Patient in for follow-up of elevated cholesterol. Doing well without complaints on current medication. Denies side effects of statin including myalgia and arthralgia and nausea. Also in today for liver function testing. Currently no chest pain, shortness of breath or other cardiovascular related symptoms noted.  Patient continues to take medication for anxiety and depression doing well on low-dose Lexapro.   History Terry Pacheco has a past medical history of Aortic stenosis, Arthritis, Cancer ([place]) (10/2016), GERD (gastroesophageal reflux disease) (07/06/2014), History of radiation therapy (12/30/2016- 02/18/2017), Hypertension, Sleep apnea, and Wears glasses.   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   His family history includes Heart disease in his mother; Stroke in his father.He reports that he quit smoking about 2 years ago. His  smoking use included cigarettes. He started smoking about 36 years ago. He has a 22.50 pack-year smoking history. He has never used smokeless tobacco. He reports previous alcohol use. He reports previous drug use. Drug: Marijuana.  Current Outpatient Medications on File Prior to Visit  Medication Sig Dispense Refill  . acetaminophen (TYLENOL) 500 MG tablet Take 1,000 mg by mouth daily as needed for moderate pain or headache.    Marland Kitchen aspirin EC 81 MG tablet Take 81 mg by mouth daily.    . calcium carbonate (TUMS - DOSED IN MG ELEMENTAL CALCIUM) 500 MG chewable tablet Chew 2 tablets by mouth daily as needed for indigestion or heartburn.    . ezetimibe (ZETIA) 10 MG tablet Take 1 tablet (10 mg total) by mouth daily. Please schedule appt for further refills 1st attempt 90 tablet 2  . fluticasone (FLONASE) 50 MCG/ACT nasal spray SPRAY 2 SPRAYS INTO EACH NOSTRIL EVERY DAY 48 g 0  . lidocaine (XYLOCAINE) 2 % solution Mix 1 part 2%viscous lidocaine,1part H2O.Swish and swallow 4m of this mixture, 321m before meals and at bedtime, up to QID 100 mL 5  . Liniments (SALONPAS PAIN RELIEF PATCH EX) Apply 1 patch topically daily as needed (pain).    . sodium fluoride (FLUORISHIELD) 1.1 % GEL dental gel Instill one drop of gel into each tooth space of fluoride tray. Place over teeth for 5 minutes. Remove. Spit out excess. Repeat nightly 120 mL prn   No current facility-administered medications on file prior to visit.    ROS Review of Systems  Constitutional: Negative.   HENT: Negative.   Eyes: Negative for visual disturbance.  Respiratory: Negative for cough and shortness of breath.   Cardiovascular: Negative for chest  pain and leg swelling.  Gastrointestinal: Negative for abdominal pain, diarrhea, nausea and vomiting.  Genitourinary: Negative for difficulty urinating.  Musculoskeletal: Negative for arthralgias and myalgias.  Skin: Negative for rash.  Neurological: Negative for headaches.    Psychiatric/Behavioral: Negative for sleep disturbance.    Objective:  BP 127/82   Pulse 68   Temp 98.4 F (36.9 C) (Temporal)   Ht 5' 9" (1.753 m)   Wt 207 lb (93.9 kg)   BMI 30.57 kg/m   BP Readings from Last 3 Encounters:  04/18/19 127/82  02/22/19 133/83  01/11/19 116/81    Wt Readings from Last 3 Encounters:  04/18/19 207 lb (93.9 kg)  02/22/19 204 lb 9.6 oz (92.8 kg)  01/11/19 200 lb 3.2 oz (90.8 kg)     Physical Exam Constitutional:      General: He is not in acute distress.    Appearance: He is well-developed.  HENT:     Head: Normocephalic and atraumatic.     Right Ear: External ear normal.     Left Ear: External ear normal.     Nose: Nose normal.  Eyes:     Conjunctiva/sclera: Conjunctivae normal.     Pupils: Pupils are equal, round, and reactive to light.  Cardiovascular:     Rate and Rhythm: Normal rate and regular rhythm.     Heart sounds: Normal heart sounds. No murmur.  Pulmonary:     Effort: Pulmonary effort is normal. No respiratory distress.     Breath sounds: Normal breath sounds. No wheezing or rales.  Abdominal:     Palpations: Abdomen is soft.     Tenderness: There is no abdominal tenderness.  Musculoskeletal:        General: Normal range of motion.     Cervical back: Normal range of motion and neck supple.  Skin:    General: Skin is warm and dry.  Neurological:     Mental Status: He is alert and oriented to person, place, and time.     Deep Tendon Reflexes: Reflexes are normal and symmetric.  Psychiatric:        Behavior: Behavior normal.        Thought Content: Thought content normal.        Judgment: Judgment normal.       Assessment & Plan:   Terry Pacheco was seen today for follow-up.  Diagnoses and all orders for this visit:  Essential hypertension -     amLODipine (NORVASC) 10 MG tablet; TAKE 1 TABLET EVERY DAY FOR FOR BLOOD PRESSURE -     CBC with Differential/Platelet -     CMP14+EGFR  Hyperlipidemia with target LDL  less than 100 -     atorvastatin (LIPITOR) 40 MG tablet; Take 1 tablet (40 mg total) by mouth daily. -     CBC with Differential/Platelet -     CMP14+EGFR -     Lipid panel  Gastroesophageal reflux disease, unspecified whether esophagitis present -     omeprazole (PRILOSEC) 20 MG capsule; Take 1 capsule (20 mg total) by mouth daily. -     CBC with Differential/Platelet -     CMP14+EGFR  Screening for prostate cancer -     PSA Total (Reflex To Free)  Neoplasm of uncertain behavior of skin -     Ambulatory referral to Dermatology  GAD (generalized anxiety disorder) -     escitalopram (LEXAPRO) 20 MG tablet; Take 0.5 tablets (10 mg total) by mouth daily.   Allergies as of 04/18/2019  Reactions   Crestor [rosuvastatin]    Muscle aches      Medication List       Accurate as of April 18, 2019  9:16 PM. If you have any questions, ask your nurse or doctor.        STOP taking these medications   ketorolac 0.5 % ophthalmic solution Commonly known as: ACULAR Stopped by: Claretta Fraise, MD   ofloxacin 0.3 % ophthalmic solution Commonly known as: OCUFLOX Stopped by: Claretta Fraise, MD   prednisoLONE acetate 1 % ophthalmic suspension Commonly known as: PRED FORTE Stopped by: Claretta Fraise, MD     TAKE these medications   acetaminophen 500 MG tablet Commonly known as: TYLENOL Take 1,000 mg by mouth daily as needed for moderate pain or headache.   amLODipine 10 MG tablet Commonly known as: NORVASC TAKE 1 TABLET EVERY DAY FOR FOR BLOOD PRESSURE   aspirin EC 81 MG tablet Take 81 mg by mouth daily.   atorvastatin 40 MG tablet Commonly known as: LIPITOR Take 1 tablet (40 mg total) by mouth daily.   calcium carbonate 500 MG chewable tablet Commonly known as: TUMS - dosed in mg elemental calcium Chew 2 tablets by mouth daily as needed for indigestion or heartburn.   escitalopram 20 MG tablet Commonly known as: LEXAPRO Take 0.5 tablets (10 mg total) by mouth  daily.   ezetimibe 10 MG tablet Commonly known as: ZETIA Take 1 tablet (10 mg total) by mouth daily. Please schedule appt for further refills 1st attempt   fluticasone 50 MCG/ACT nasal spray Commonly known as: FLONASE SPRAY 2 SPRAYS INTO EACH NOSTRIL EVERY DAY   lidocaine 2 % solution Commonly known as: XYLOCAINE Mix 1 part 2%viscous lidocaine,1part H2O.Swish and swallow 47m of this mixture, 335m before meals and at bedtime, up to QID   omeprazole 20 MG capsule Commonly known as: PRILOSEC Take 1 capsule (20 mg total) by mouth daily. What changed: how much to take Changed by: WaClaretta FraiseMD   SALONPAS PAIN RELIEF PATCH EX Apply 1 patch topically daily as needed (pain).   sodium fluoride 1.1 % Gel dental gel Commonly known as: FLUORISHIELD Instill one drop of gel into each tooth space of fluoride tray. Place over teeth for 5 minutes. Remove. Spit out excess. Repeat nightly       Meds ordered this encounter  Medications  . omeprazole (PRILOSEC) 20 MG capsule    Sig: Take 1 capsule (20 mg total) by mouth daily.    Dispense:  90 capsule    Refill:  1  . escitalopram (LEXAPRO) 20 MG tablet    Sig: Take 0.5 tablets (10 mg total) by mouth daily.    Dispense:  45 tablet    Refill:  1  . atorvastatin (LIPITOR) 40 MG tablet    Sig: Take 1 tablet (40 mg total) by mouth daily.    Dispense:  90 tablet    Refill:  1  . amLODipine (NORVASC) 10 MG tablet    Sig: TAKE 1 TABLET EVERY DAY FOR FOR BLOOD PRESSURE    Dispense:  90 tablet    Refill:  1    Patient thriving on current regimen.  He was recently vaccinated for Covid having received his second dose 5 days ago.  He recently had cataract surgery in each eye this past fall.  He is stable with his current medication regimen denying any side effects.  He should follow-up in 6 months and as needed.  He is followed for  his multiple chronic illnesses and in addition today presents an acute problem for which he is referred to  dermatology.  Medications will remain unchanged at this point and refilled as noted above.   Follow-up: Return in about 6 months (around 10/16/2019).  Claretta Fraise, M.D.

## 2019-04-19 LAB — CMP14+EGFR
ALT: 18 IU/L (ref 0–44)
AST: 22 IU/L (ref 0–40)
Albumin/Globulin Ratio: 1.6 (ref 1.2–2.2)
Albumin: 4.2 g/dL (ref 3.8–4.8)
Alkaline Phosphatase: 132 IU/L — ABNORMAL HIGH (ref 39–117)
BUN/Creatinine Ratio: 16 (ref 10–24)
BUN: 17 mg/dL (ref 8–27)
Bilirubin Total: 0.5 mg/dL (ref 0.0–1.2)
CO2: 21 mmol/L (ref 20–29)
Calcium: 9 mg/dL (ref 8.6–10.2)
Chloride: 102 mmol/L (ref 96–106)
Creatinine, Ser: 1.08 mg/dL (ref 0.76–1.27)
GFR calc Af Amer: 81 mL/min/{1.73_m2} (ref >59)
GFR calc non Af Amer: 70 mL/min/{1.73_m2} (ref >59)
Globulin, Total: 2.7 g/dL (ref 1.5–4.5)
Glucose: 101 mg/dL — ABNORMAL HIGH (ref 65–99)
Potassium: 4.6 mmol/L (ref 3.5–5.2)
Sodium: 136 mmol/L (ref 134–144)
Total Protein: 6.9 g/dL (ref 6.0–8.5)

## 2019-04-19 LAB — CBC WITH DIFFERENTIAL/PLATELET
Basophils Absolute: 0 10*3/uL (ref 0.0–0.2)
Basos: 1 %
EOS (ABSOLUTE): 0.3 10*3/uL (ref 0.0–0.4)
Eos: 6 %
Hematocrit: 46.8 % (ref 37.5–51.0)
Hemoglobin: 15.6 g/dL (ref 13.0–17.7)
Immature Grans (Abs): 0 10*3/uL (ref 0.0–0.1)
Immature Granulocytes: 0 %
Lymphocytes Absolute: 1.3 10*3/uL (ref 0.7–3.1)
Lymphs: 21 %
MCH: 30.2 pg (ref 26.6–33.0)
MCHC: 33.3 g/dL (ref 31.5–35.7)
MCV: 91 fL (ref 79–97)
Monocytes Absolute: 0.7 10*3/uL (ref 0.1–0.9)
Monocytes: 11 %
Neutrophils Absolute: 3.8 10*3/uL (ref 1.4–7.0)
Neutrophils: 61 %
Platelets: 162 10*3/uL (ref 150–450)
RBC: 5.16 x10E6/uL (ref 4.14–5.80)
RDW: 12.8 % (ref 11.6–15.4)
WBC: 6.1 10*3/uL (ref 3.4–10.8)

## 2019-04-19 LAB — LIPID PANEL
Chol/HDL Ratio: 2.5 ratio (ref 0.0–5.0)
Cholesterol, Total: 108 mg/dL (ref 100–199)
HDL: 43 mg/dL (ref >39)
LDL Chol Calc (NIH): 48 mg/dL (ref 0–99)
Triglycerides: 83 mg/dL (ref 0–149)
VLDL Cholesterol Cal: 17 mg/dL (ref 5–40)

## 2019-04-19 LAB — PSA TOTAL (REFLEX TO FREE): Prostate Specific Ag, Serum: 1.2 ng/mL (ref 0.0–4.0)

## 2019-04-19 NOTE — Progress Notes (Signed)
Hello Terry Pacheco,  Your lab result is normal and/or stable.Some minor variations that are not significant are commonly marked abnormal, but do not represent any medical problem for you.  Best regards, Emelia Sandoval, M.D.

## 2019-06-08 ENCOUNTER — Encounter: Payer: Self-pay | Admitting: Dermatology

## 2019-06-08 ENCOUNTER — Other Ambulatory Visit: Payer: Self-pay

## 2019-06-08 ENCOUNTER — Ambulatory Visit (INDEPENDENT_AMBULATORY_CARE_PROVIDER_SITE_OTHER): Payer: 59 | Admitting: Dermatology

## 2019-06-08 DIAGNOSIS — L720 Epidermal cyst: Secondary | ICD-10-CM | POA: Diagnosis not present

## 2019-06-08 DIAGNOSIS — Z1283 Encounter for screening for malignant neoplasm of skin: Secondary | ICD-10-CM

## 2019-06-08 DIAGNOSIS — D229 Melanocytic nevi, unspecified: Secondary | ICD-10-CM

## 2019-06-08 DIAGNOSIS — L281 Prurigo nodularis: Secondary | ICD-10-CM

## 2019-06-08 DIAGNOSIS — L821 Other seborrheic keratosis: Secondary | ICD-10-CM | POA: Diagnosis not present

## 2019-06-08 DIAGNOSIS — L729 Follicular cyst of the skin and subcutaneous tissue, unspecified: Secondary | ICD-10-CM

## 2019-06-08 NOTE — Patient Instructions (Addendum)
Seborrheic Keratosis A seborrheic keratosis is a common, noncancerous (benign) skin growth. These growths are velvety, waxy, rough, tan, brown, or black spots that appear on the skin. These skin growths can be flat or raised, and scaly. What are the causes? The cause of this condition is not known. What increases the risk? You are more likely to develop this condition if you:  Have a family history of seborrheic keratosis.  Are 50 or older.  Are pregnant.  Have had estrogen replacement therapy. What are the signs or symptoms? Symptoms of this condition include growths on the face, chest, shoulders, back, or other areas. These growths:  Are usually painless, but may become irritated and itchy.  Can be yellow, brown, black, or other colors.  Are slightly raised or have a flat surface.  Are sometimes rough or wart-like in texture.  Are often velvety or waxy on the surface.  Are round or oval-shaped.  Often occur in groups, but may occur as a single growth. How is this diagnosed? This condition is diagnosed with a medical history and physical exam.  A sample of the growth may be tested (skin biopsy).  You may need to see a skin specialist (dermatologist). How is this treated? Treatment is not usually needed for this condition, unless the growths are irritated or bleed often.  You may also choose to have the growths removed if you do not like their appearance. ? Most commonly, these growths are treated with a procedure in which liquid nitrogen is applied to "freeze" off the growth (cryosurgery). ? They may also be burned off with electricity (electrocautery) or removed by scraping (curettage). Follow these instructions at home:  Watch your growth for any changes.  Keep all follow-up visits as told by your health care provider. This is important.  Do not scratch or pick at the growth or growths. This can cause them to become irritated or infected. Contact a health care  provider if:  You suddenly have many new growths.  Your growth bleeds, itches, or hurts.  Your growth suddenly becomes larger or changes color. Summary  A seborrheic keratosis is a common, noncancerous (benign) skin growth.  Treatment is not usually needed for this condition, unless the growths are irritated or bleed often.  Watch your growth for any changes.  Contact a health care provider if you suddenly have many new growths or your growth suddenly becomes larger or changes color.  Keep all follow-up visits as told by your health care provider. This is important. This information is not intended to replace advice given to you by your health care provider. Make sure you discuss any questions you have with your health care provider. Document Revised: 06/25/2017 Document Reviewed: 06/25/2017 Elsevier Patient Education  [place].  Benign cyst- doesn't need to be removed -if you want removed need 30 minute surgery appointment with Dr. Denna Haggard

## 2019-06-08 NOTE — Progress Notes (Signed)
   New Patient   Subjective  Terry Pacheco is a 69 y.o. male who presents for the following: Skin Problem (Here to check spot under left eye/cheek. Looks like black heads per patient but nothing comes out. Check right cheek irritated spot. ).  growth Location: Left cheek Duration: 1+ year Quality: Stable Associated Signs/Symptoms: Modifying Factors:  Severity:  Timing: Wife is concerned Context:    The following portions of the chart were reviewed this encounter and updated as appropriate:     Objective  Well appearing patient in no apparent distress; mood and affect are within normal limits.  All skin waist up examined.  First visit for Terry Pacheco.  His chief concern was a growth on the left cheek.  Examination showed a superficial epidermoid cyst with 2 small blackhead-like openings.  I explained the benign nature of this and the option of surgically removing it.  For now Terry Pacheco is content to leave it unless there is clinical change.  The rest of his skin examination from the waist up showed no atypical moles nor nonmobile skin cancer.  On the left forearm and the right lower back are textured tan benign keratoses which do not require removal.  Also on the left forearm is a little thickened bump that fits a pickers nodule; this too can be ignored if stable.  I reassured Terry Pacheco that simply picking a bump on that turned into skin cancer.  Follow-up can be on a as needed basis.  Assessment & Plan

## 2019-06-12 ENCOUNTER — Encounter: Payer: Self-pay | Admitting: Dermatology

## 2019-06-22 IMAGING — CT CT CHEST W/O CM
2 of 4 series · 12 of 36 positions shown, 15 images · non-contrast
Comparison: PET-CT 12/08/2017

CLINICAL DATA: Followup pulmonary lesion in the left apex. History
of tonsillar cancer.

EXAM:
CT CHEST WITHOUT CONTRAST
TECHNIQUE: Multidetector CT imaging of the chest was performed following the
standard protocol without IV contrast.

[Series 2: chest 2.00 br40 s3 ax · axial · 0.52mm/px · z∈[+1371,+1643]mm · 9 of 162 slices shown, 12 images]
[im 13/162  mediastinal]
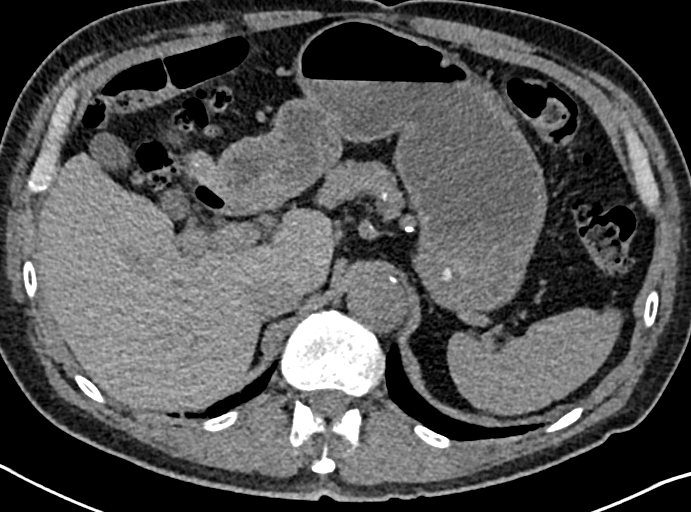
[im 13/162  lung]
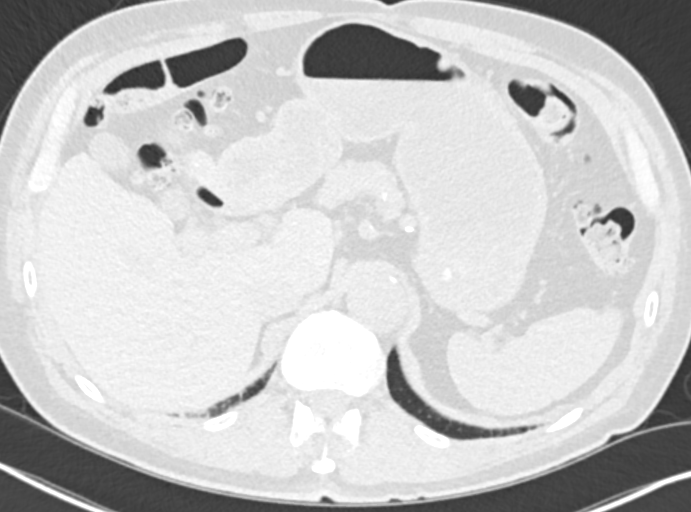
[im 38/162  lung]
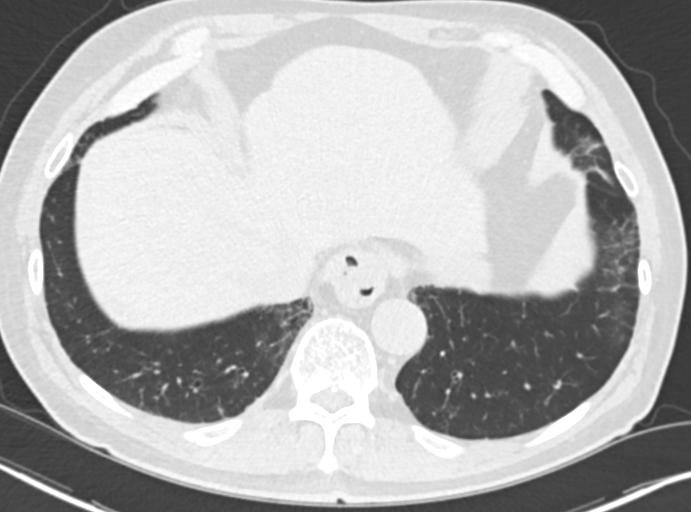
[im 50/162  lung]
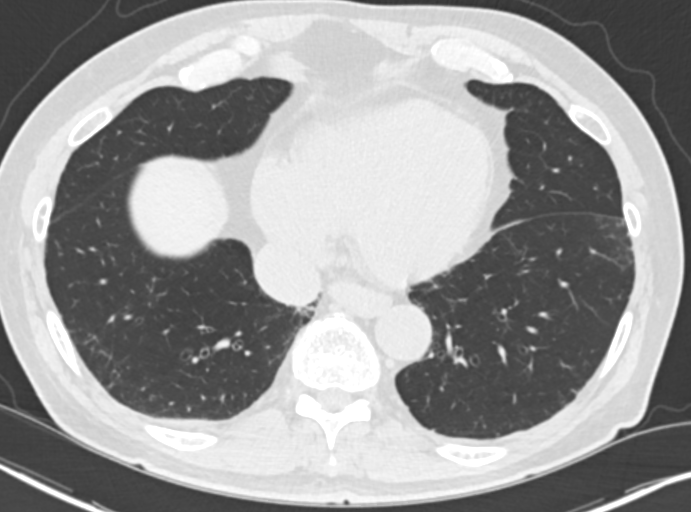
[im 62/162  lung]
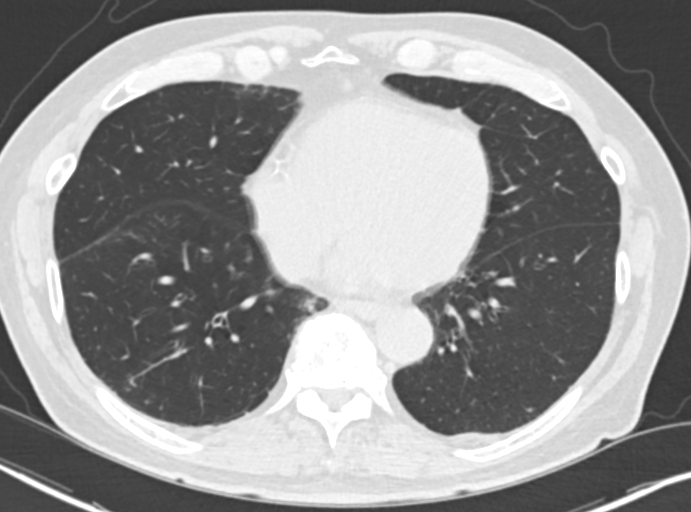
[im 87/162  mediastinal]
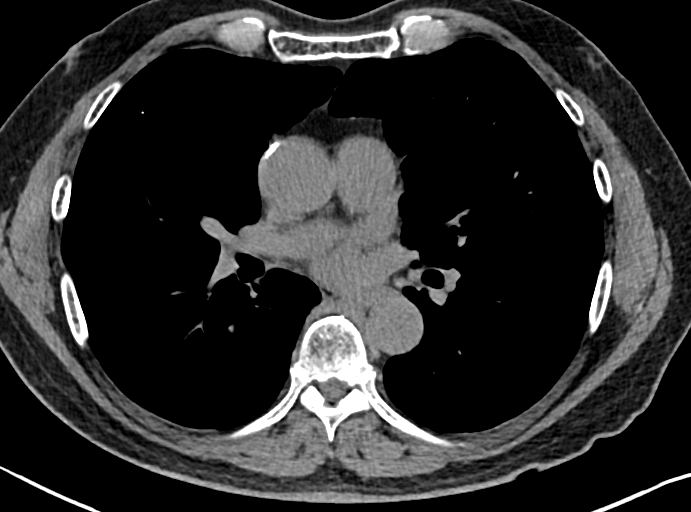
[im 87/162  lung]
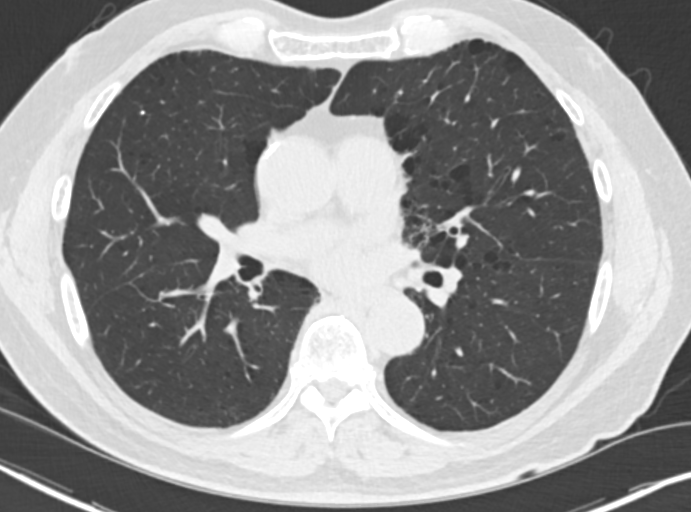
[im 100/162  lung]
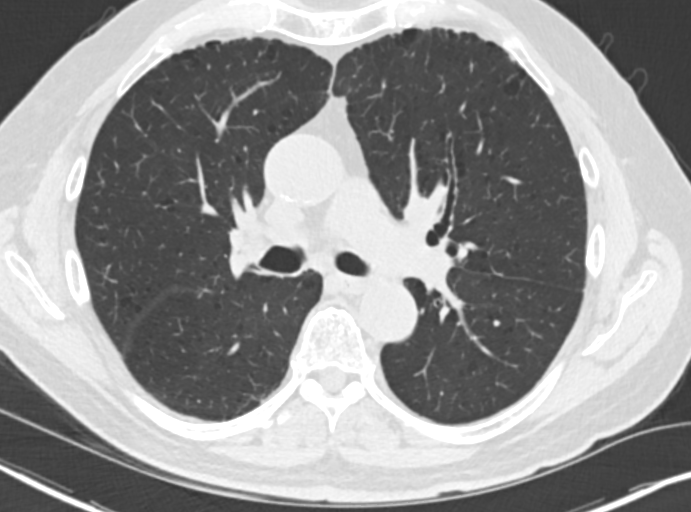
[im 112/162  lung]
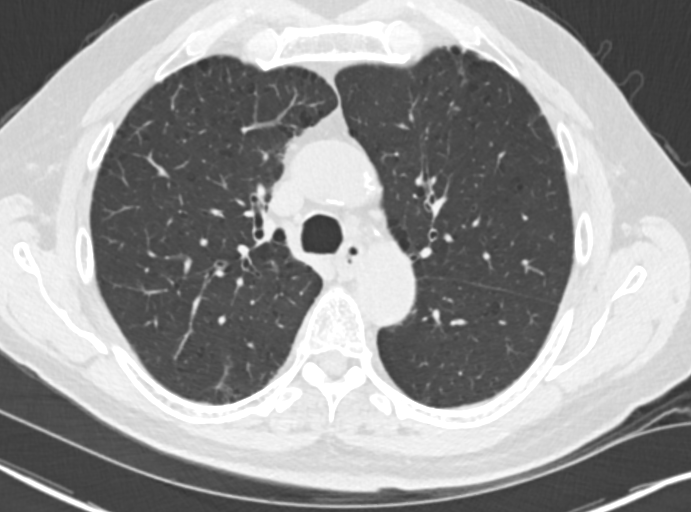
[im 137/162  lung]
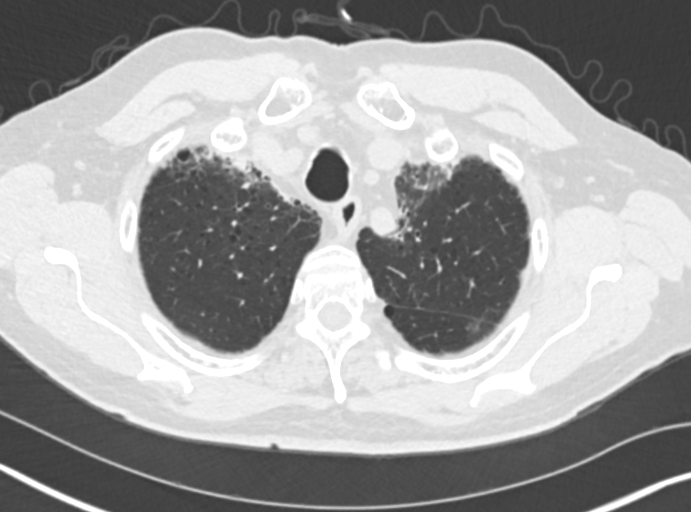
[im 149/162  mediastinal]
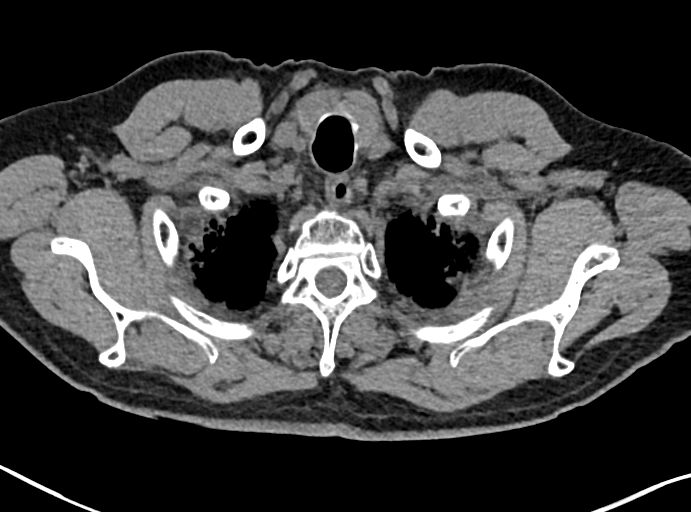
[im 149/162  lung]
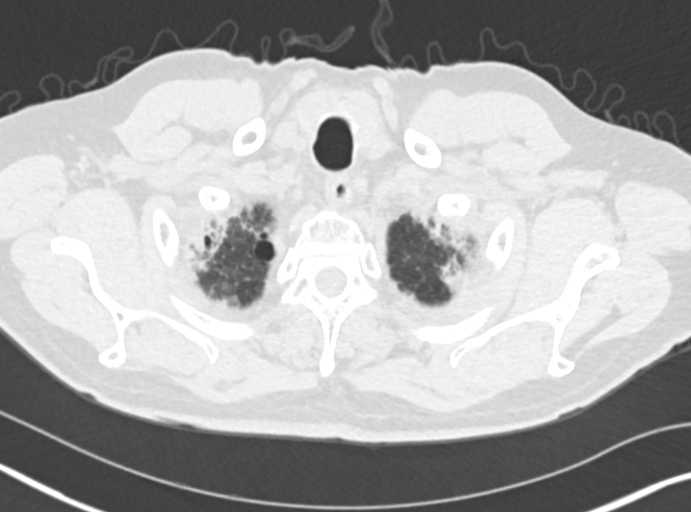

[Series 4: chest 2.00 br40 s3 cor · coronal · 0.63mm/px · 3 of 133 slices shown]
[im 27/133  lung]
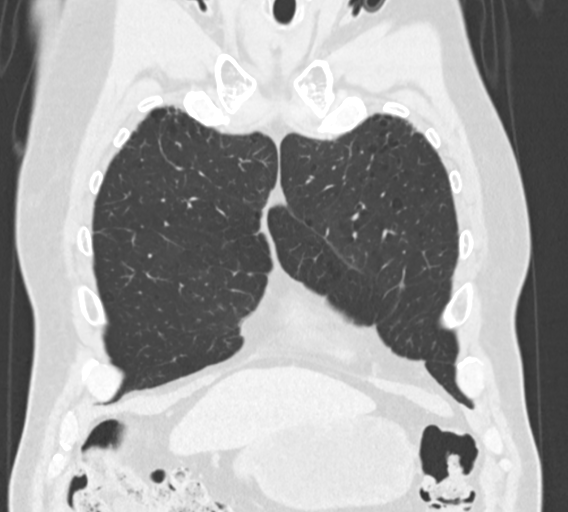
[im 53/133  lung]
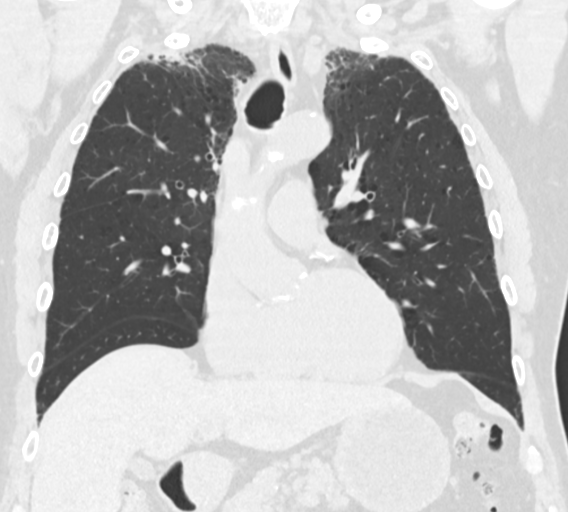
[im 80/133  lung]
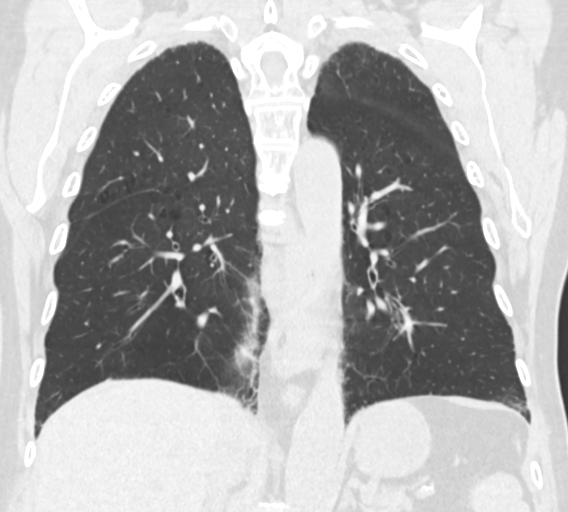

[12 of 36 positions shown; findings below may reference images not displayed]

FINDINGS: Cardiovascular: The heart is normal in size. No pericardial
effusion. Stable tortuosity and calcification of the thoracic aorta.
Stable coronary artery calcifications. Stable calcifications at the
aortic valve.

Mediastinum/Nodes: No mediastinal or hilar mass or lymphadenopathy.
Small scattered lymph nodes are stable. The esophagus is grossly
normal.

Lungs/Pleura: Dense biapical pleural and parenchymal scarring
changes. The nodular density at the left lung apex is grossly
stable. It measures approximately 13 x 10 mm and previously measured
12 x 11.5 mm.

Stable underlying emphysematous changes. No new/acute pulmonary
findings or new pulmonary lesions. A few small scattered calcified
granulomas are stable. No pleural effusion.

Upper Abdomen: No significant upper abdominal findings. No hepatic
or adrenal gland lesions are identified without contrast. Small
hiatal hernia noted.

Musculoskeletal: No significant bony findings. Stable pectus
carinatum.
IMPRESSION: 1. Stable appearing left apical density, likely scar tissue given
the surrounding changes. This was mildly hypermetabolic on the
PET-CT from 04/29/2017 but not hypermetabolic on the more recent
PET-CT from 12/08/2017. Recommend follow-up noncontrast chest CT in
6 months.
2. No enlarged mediastinal or hilar lymph nodes.
3. Stable advanced emphysematous changes and apical pulmonary
scarring. No new/acute pulmonary findings.
4. Stable advanced atherosclerotic calcifications involving the
thoracic aorta and branch vessels.

Aortic Atherosclerosis (AZPFL-RNS.S) and Emphysema (AZPFL-CXD.7).

## 2019-07-11 ENCOUNTER — Inpatient Hospital Stay (HOSPITAL_BASED_OUTPATIENT_CLINIC_OR_DEPARTMENT_OTHER): Payer: 59 | Admitting: Hematology

## 2019-07-11 ENCOUNTER — Other Ambulatory Visit: Payer: Self-pay

## 2019-07-11 ENCOUNTER — Inpatient Hospital Stay: Payer: 59 | Attending: Hematology

## 2019-07-11 VITALS — BP 119/67 | HR 69 | Temp 98.7°F | Resp 18 | Ht 69.0 in | Wt 209.7 lb

## 2019-07-11 DIAGNOSIS — K069 Disorder of gingiva and edentulous alveolar ridge, unspecified: Secondary | ICD-10-CM | POA: Diagnosis not present

## 2019-07-11 DIAGNOSIS — Z87891 Personal history of nicotine dependence: Secondary | ICD-10-CM | POA: Diagnosis not present

## 2019-07-11 DIAGNOSIS — Z85818 Personal history of malignant neoplasm of other sites of lip, oral cavity, and pharynx: Secondary | ICD-10-CM | POA: Insufficient documentation

## 2019-07-11 DIAGNOSIS — C090 Malignant neoplasm of tonsillar fossa: Secondary | ICD-10-CM

## 2019-07-11 DIAGNOSIS — K056 Periodontal disease, unspecified: Secondary | ICD-10-CM

## 2019-07-11 DIAGNOSIS — I10 Essential (primary) hypertension: Secondary | ICD-10-CM | POA: Insufficient documentation

## 2019-07-11 DIAGNOSIS — R911 Solitary pulmonary nodule: Secondary | ICD-10-CM | POA: Diagnosis not present

## 2019-07-11 LAB — CBC WITH DIFFERENTIAL/PLATELET
Abs Immature Granulocytes: 0.01 10*3/uL (ref 0.00–0.07)
Basophils Absolute: 0 10*3/uL (ref 0.0–0.1)
Basophils Relative: 1 %
Eosinophils Absolute: 0.4 10*3/uL (ref 0.0–0.5)
Eosinophils Relative: 7 %
HCT: 46.3 % (ref 39.0–52.0)
Hemoglobin: 15.4 g/dL (ref 13.0–17.0)
Immature Granulocytes: 0 %
Lymphocytes Relative: 21 %
Lymphs Abs: 1.1 10*3/uL (ref 0.7–4.0)
MCH: 30 pg (ref 26.0–34.0)
MCHC: 33.3 g/dL (ref 30.0–36.0)
MCV: 90.3 fL (ref 80.0–100.0)
Monocytes Absolute: 0.5 10*3/uL (ref 0.1–1.0)
Monocytes Relative: 9 %
Neutro Abs: 3.3 10*3/uL (ref 1.7–7.7)
Neutrophils Relative %: 62 %
Platelets: 148 10*3/uL — ABNORMAL LOW (ref 150–400)
RBC: 5.13 MIL/uL (ref 4.22–5.81)
RDW: 12.9 % (ref 11.5–15.5)
WBC: 5.3 10*3/uL (ref 4.0–10.5)
nRBC: 0 % (ref 0.0–0.2)

## 2019-07-11 LAB — CMP (CANCER CENTER ONLY)
ALT: 14 U/L (ref 0–44)
AST: 15 U/L (ref 15–41)
Albumin: 3.8 g/dL (ref 3.5–5.0)
Alkaline Phosphatase: 118 U/L (ref 38–126)
Anion gap: 9 (ref 5–15)
BUN: 16 mg/dL (ref 8–23)
CO2: 25 mmol/L (ref 22–32)
Calcium: 9 mg/dL (ref 8.9–10.3)
Chloride: 103 mmol/L (ref 98–111)
Creatinine: 1.15 mg/dL (ref 0.61–1.24)
GFR, Est AFR Am: >60 mL/min (ref >60)
GFR, Estimated: >60 mL/min (ref >60)
Glucose, Bld: 80 mg/dL (ref 70–99)
Potassium: 4.6 mmol/L (ref 3.5–5.1)
Sodium: 137 mmol/L (ref 135–145)
Total Bilirubin: 0.7 mg/dL (ref 0.3–1.2)
Total Protein: 6.7 g/dL (ref 6.5–8.1)

## 2019-07-11 LAB — TSH: TSH: 2.626 u[IU]/mL (ref 0.320–4.118)

## 2019-07-11 MED ORDER — B COMPLEX VITAMINS PO CAPS: 1.0000 | ORAL_CAPSULE | Freq: Every day | ORAL | 2 refills | Status: DC

## 2019-07-11 MED ORDER — CHLORHEXIDINE GLUCONATE 0.12 % MT SOLN: 15.0000 mL | Freq: Two times a day (BID) | OROMUCOSAL | 0 refills | Status: DC

## 2019-07-11 NOTE — Progress Notes (Signed)
HEMATOLOGY/ONCOLOGY FOLLOW UP VISIT NOTE  Date of Service: 07/11/2019  Patient Care Team: Claretta Fraise, MD as PCP - General (Family Medicine) Jodi Marble, MD as Consulting Physician (Otolaryngology) Eppie Gibson, MD as Attending Physician (Radiation Oncology) Leota Sauers, RN (Inactive) as Oncology Nurse Navigator (Oncology) Karie Mainland, RD as Dietitian (Nutrition) Jomarie Longs, PT (Inactive) as Physical Therapist (Physical Therapy) Sharen Counter, CCC-SLP as Speech Language Pathologist (Speech Pathology) Kennith Center, LCSW as Social Worker  CHIEF COMPLAINTS/PURPOSE OF CONSULTATION:  F/u for tonsillar carcinoma Pulmonary nodule  HISTORY OF PRESENTING ILLNESS:   Terry Pacheco is a wonderful 69 y.o. male who has been referred to [place] by ENT Specialist, Dr. Erik Obey for evaluation and management of invasive squamous cell carcinoma of tonsil.   He has a PMHx of GERD, HTN, High cholesterol which he takes medications for. He denies PMHx of seizures. He is accompanied by his wife and sister today. He reports that he is doing well overall. The pt initially presented with sudden-onset right lateral tongue numbness that has been ongoing for approximately 1 year. Subsequently, he had left sided gum swelling and intermittent sore throat symptoms. He notes no stroke-like symptoms. He notes that following these symptoms, he was evaluated by multiple providers including an ENT specialist and a dentist prior to meeting with Dr. Erik Obey. Pt wife noted swelling and discoloration inside his mouth prior to the patient being evaluated by Dr. Erik Obey. At his first visit with Dr. Erik Obey, he noted an abnormality to his left tonsil and completed a biopsy that same day.   He has had CT soft tissue neck with contrast on 11/18/2016 with results showing: Fullness of the right palatine tonsil. Surrounding fat planes are ill-defined. This area is obscured by dental artifact. Possible right  tonsillar carcinoma.   The patient has had biopsy completed on 11/19/2016 with results of: "RIGHT FAUCIAL TONSIL", BIOPSY: Squamous cell carcinoma, suspicious for superficial invasion in this material. Immunostain p16 positive.   He also had a PET scan completed on 12/02/2016 with results revealing: IMPRESSION: 1. Mildly asymmetric tonsillar activity along the right tonsillar sinus, maximum SUV 7.7 as compared to the left side 5.2, suspicious in this setting for right-sided malignancy. There is also a mildly enlarged and hypermetabolic right level IIa lymph node with maximum SUV of 8.4. No evidence of other metastatic spread. 2. Faint accentuation of activity in the left lateral prostate gland apex, but low-grade. Correlate with PSA level in determining whether further workup is warranted. 3. Other imaging findings of potential clinical significance: Aortic Atherosclerosis (ICD10-I70.0) and Emphysema (ICD10-J43.9). Coronary atherosclerosis. Small type 1 hiatal hernia. Bilateral renal cysts. Colonic diverticulosis. Lastly, he had a MR Face trigeminal on 12/09/2016 with results of IMPRESSION: 1. No evidence of perineural spread of malignancy. 2. Mild asymmetry of the palatine tonsils without discrete mass. Correlate with direct visualization.   As far as surgeries, he has had a polyp on left sided throat that was removed 3-4 years ago that resulted negative for malignancy. He also has had a right hip replacement. Lastly, he has had a left inguinal hernia operation in 1978. He started smoking cigarettes at age 64 and would smoke 1 PPD while working. When he retired several years ago he smoked 0.5 PPD and has quit smoking cigarettes 1 month ago since his diagnosis. He states that he used a vape after quitting but was advised to quit via Dr. Isidore Moos. He occasionally consumes ETOH. 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 Denies allergies to medications at this time. He  reports that his father died of a stroke at age 96 and his mother died from cardiac issues. Mother had gastric cancer and was diagnosed in late 87's. Denies any other cancers of blood disorders in the family.    On review of systems, he denies hematuria, dysuria, or urinary retention. He reports urinary frequency over the past year. He reports bowel incontinence following a bowel movement x 1 year that has gradually improved more recently. He notes that his bowel incontinence has been intermittent. He has well formed stools without blood in stools or rectal bleeding. He denies increase in bowel movements and he has up to 1 bowel movement daily. He has had 2-3 occurrences of hemorrhoids with no recent flares. He denies mucous in his stools. He denies injuries or surgeries to his rectal area. He denies bladder incontinence. He has lower back pain that has been chronic and unchanged. He has a prior hx of diverticulitis approximately 10 years ago that was followed with colonoscopy and was then diagnosed with diverticulosis. He denies headache at this time. He is able to ambulate for prolonged periods without dyspnea on exertion. 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 He has had an audiogram completed by an ENT specialist in [place], [place]:  Surveillance   INTERIM HISTORY:   Terry Pacheco is called today for management and evaluation of his tonsillar cancer and pulmonary nodule. We are joined today by his wife, Mrs. Budner. The patient's last visit with [place] was on 01/11/2019. The pt reports that he is doing well overall.  The pt reports that he has a bump on his gums that has been recurrent. He has also had some gum inflammation around that area for the last month. Pt saw his Dentist in February and there were no issues detected.   Pt is still having discomfort with carbonated beverages, but has been able to eat spicy food. He has been  set up with another ENT within the same practice after Dr. Noreene Filbert retirement.  Pt has received both doses of COVID19 vaccine.   Lab results today (07/11/19) of CBC w/diff and CMP is as follows: all values are WNL except for PLT at 148K. 07/11/2019 TSH at 2.626  On review of systems, pt reports gum soreness, gum nodule and denies fatigue, unexpected weight loss, swallowing issues, SOB, cough, abdominal pain and any other symptoms.    MEDICAL HISTORY:  Past Medical History:  Diagnosis Date  . Aortic stenosis    Mild to moderate by echo 11/2016  . Arthritis   . Cancer ([place]) 10/2016   cancer of the tonsil/ 35 radiation treatments/4 doses of chemo/last radiatio 02/18/2017  . GERD (gastroesophageal reflux disease) 07/06/2014  . History of radiation therapy 12/30/2016- 02/18/2017   Right Tonsil and bilateral neck/ 70 Gy in 35 fractions to gross diseasae, 63 gy in 35 fractions to high risk nodal echelons, and 56 Gy in 35 fraction to intermediate risk nodal echelons.   . Hypertension   . Sleep apnea    does not use CPAP  . Wears glasses     SURGICAL HISTORY: Past Surgical History:  Procedure Laterality Date  . HERNIA REPAIR     right inguinal hernia  . IR CV LINE INJECTION  01/14/2017  . IR FLUORO GUIDE PORT INSERTION RIGHT  12/25/2016  .  IR GASTROSTOMY TUBE MOD SED  12/25/2016  . IR GASTROSTOMY TUBE REMOVAL  06/05/2017  . IR REMOVAL TUN ACCESS W/ PORT W/O FL MOD SED  03/06/2017  . IR US GUIDE VASC ACCESS RIGHT  12/25/2016  . LEFT HEART CATH AND CORONARY ANGIOGRAPHY N/A 12/07/2017   Procedure: LEFT HEART CATH AND CORONARY ANGIOGRAPHY;  Surgeon: Belva Crome, MD;  Location: [facility];  Service: Cardiovascular;  Laterality: N/A;  . polyp removal  2008   during colonoscopy  . TOTAL HIP ARTHROPLASTY Right 10/16/2015   Procedure: RIGHT TOTAL HIP ARTHROPLASTY ANTERIOR APPROACH;  Surgeon: Paralee Cancel, MD;  Location: [facility];  Service: Orthopedics;  Laterality: Right;  . vocal cord  polyp      removed 2-3 years ago    SOCIAL HISTORY: Social History   Socioeconomic History  . Marital status: Married    Spouse name: Not on file  . Number of children: 1  . Years of education: Not on file  . Highest education level: Not on file  Occupational History  . Occupation: Retired     Fish farm manager: [employer]  Tobacco Use  . Smoking status: Former Smoker    Packs/day: 0.50    Years: 45.00    Pack years: 22.50    Types: Cigarettes    Start date: 01/12/1983    Quit date: 11/17/2016    Years since quitting: 2.6  . Smokeless tobacco: Never Used  Substance and Sexual Activity  . Alcohol use: Not Currently    Alcohol/week: 0.0 standard drinks  . Drug use: Not Currently    Types: Marijuana    Comment: last use 10/09/2015  . Sexual activity: Not on file  Other Topics Concern  . Not on file  Social History Narrative  . Not on file   Social Determinants of Health   Financial Resource Strain:   . Difficulty of Paying Living Expenses:   Food Insecurity:   . Worried About Charity fundraiser in the Last Year:   . Arboriculturist in the Last Year:   Transportation Needs:   . Film/video editor (Medical):   Marland Kitchen Lack of Transportation (Non-Medical):   Physical Activity:   . Days of Exercise per Week:   . Minutes of Exercise per Session:   Stress:   . Feeling of Stress :   Social Connections:   . Frequency of Communication with Friends and Family:   . Frequency of Social Gatherings with Friends and Family:   . Attends Religious Services:   . Active Member of Clubs or Organizations:   . Attends Archivist Meetings:   Marland Kitchen Marital Status:   Intimate Partner Violence:   . Fear of Current or Ex-Partner:   . Emotionally Abused:   Marland Kitchen Physically Abused:   . Sexually Abused:     FAMILY HISTORY: Family History  Problem Relation Age of Onset  . Heart disease Mother   . Stroke Father   . Colon cancer Neg Hx   . Esophageal cancer Neg Hx   . Pancreatic  cancer Neg Hx   . Stomach cancer Neg Hx   . Liver cancer Neg Hx     ALLERGIES:  is allergic to crestor [rosuvastatin].  MEDICATIONS:  Current Outpatient Medications  Medication Sig Dispense Refill  . acetaminophen (TYLENOL) 500 MG tablet Take 1,000 mg by mouth daily as needed for moderate pain or headache.    Marland Kitchen amLODipine (NORVASC) 10 MG tablet TAKE 1 TABLET EVERY  DAY FOR FOR BLOOD PRESSURE 90 tablet 1  . aspirin EC 81 MG tablet Take 81 mg by mouth daily.    Marland Kitchen atorvastatin (LIPITOR) 40 MG tablet Take 1 tablet (40 mg total) by mouth daily. 90 tablet 1  . calcium carbonate (TUMS - DOSED IN MG ELEMENTAL CALCIUM) 500 MG chewable tablet Chew 2 tablets by mouth daily as needed for indigestion or heartburn.    . escitalopram (LEXAPRO) 20 MG tablet Take 0.5 tablets (10 mg total) by mouth daily. 45 tablet 1  . ezetimibe (ZETIA) 10 MG tablet Take 1 tablet (10 mg total) by mouth daily. Please schedule appt for further refills 1st attempt 90 tablet 2  . Liniments (SALONPAS PAIN RELIEF PATCH EX) Apply 1 patch topically daily as needed (pain).    Marland Kitchen omeprazole (PRILOSEC) 20 MG capsule Take 1 capsule (20 mg total) by mouth daily. 90 capsule 1  . sodium fluoride (FLUORISHIELD) 1.1 % GEL dental gel Instill one drop of gel into each tooth space of fluoride tray. Place over teeth for 5 minutes. Remove. Spit out excess. Repeat nightly 120 mL prn  . b complex vitamins capsule Take 1 capsule by mouth daily. 30 capsule 2  . chlorhexidine (PERIDEX) 0.12 % solution Use as directed 15 mLs in the mouth or throat 2 (two) times daily. Swish and spit 473 mL 0  . fluticasone (FLONASE) 50 MCG/ACT nasal spray SPRAY 2 SPRAYS INTO EACH NOSTRIL EVERY DAY (Patient not taking: Reported on 06/08/2019) 48 g 0  . lidocaine (XYLOCAINE) 2 % solution Mix 1 part 2%viscous lidocaine,1part H2O.Swish and swallow 37mL of this mixture, 19min before meals and at bedtime, up to QID (Patient not taking: Reported on 06/08/2019) 100 mL 5   No  current facility-administered medications for this visit.    REVIEW OF SYSTEMS:   A 10+ POINT REVIEW OF SYSTEMS WAS OBTAINED including neurology, dermatology, psychiatry, cardiac, respiratory, lymph, extremities, GI, GU, Musculoskeletal, constitutional, breasts, reproductive, HEENT.  All pertinent positives are noted in the HPI.  All others are negative.   PHYSICAL EXAMINATION: ECOG PERFORMANCE STATUS: 1 - Symptomatic but completely ambulatory Vitals:   07/11/19 1205  BP: 119/67  Pulse: 69  Resp: 18  Temp: 98.7 F (37.1 C)  TempSrc: Temporal  SpO2: 97%  Weight: 209 lb 11.2 oz (95.1 kg)  Height: 5\' 9"  (1.753 m)  .  GENERAL:alert, in no acute distress and comfortable SKIN: no acute rashes, no significant lesions EYES: conjunctiva are pink and non-injected, sclera anicteric OROPHARYNX: MMM, no exudates, no oropharyngeal erythema or ulceration NECK: supple, no JVD LYMPH:  no palpable lymphadenopathy in the cervical, axillary or inguinal regions LUNGS: clear to auscultation b/l with normal respiratory effort HEART: regular rate & rhythm ABDOMEN:  normoactive bowel sounds , non tender, not distended. No palpable hepatosplenomegaly.  Extremity: no pedal edema PSYCH: alert & oriented x 3 with fluent speech NEURO: no focal motor/sensory deficits  LABORATORY DATA:  I have reviewed the data as listed   CBC Latest Ref Rng & Units 07/11/2019 04/18/2019 01/11/2019  WBC 4.0 - 10.5 K/uL 5.3 6.1 5.6  Hemoglobin 13.0 - 17.0 g/dL 15.4 15.6 15.1  Hematocrit 39.0 - 52.0 % 46.3 46.8 44.3  Platelets 150 - 400 K/uL 148(L) 162 155     . CMP Latest Ref Rng & Units 07/11/2019 04/18/2019 01/11/2019  Glucose 70 - 99 mg/dL 80 101(H) 98  BUN 8 - 23 mg/dL 16 17 13   Creatinine 0.61 - 1.24 mg/dL 1.15 1.08 1.11  Sodium  135 - 145 mmol/L 137 136 136  Potassium 3.5 - 5.1 mmol/L 4.6 4.6 4.8  Chloride 98 - 111 mmol/L 103 102 104  CO2 22 - 32 mmol/L 25 21 22   Calcium 8.9 - 10.3 mg/dL 9.0 9.0 9.0  Total  Protein 6.5 - 8.1 g/dL 6.7 6.9 7.1  Total Bilirubin 0.3 - 1.2 mg/dL 0.7 0.5 0.5  Alkaline Phos 38 - 126 U/L 118 132(H) 126  AST 15 - 41 U/L 15 22 17   ALT 0 - 44 U/L 14 18 20    PATHOLOGY:    RADIOGRAPHIC STUDIES: I have personally reviewed the radiological images as listed and agreed with the findings in the report.  No results found.  ASSESSMENT & PLAN:   69 y.o. 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 -creatinine WNL. Some concern for decreased hearing and baseline tinnitus due to previous job noise related injury. Baseline audiogram showed normal symmetric low-frequency hearing, dropping off substantially above 2000Hz .  s/p recent completion of concurrent chemo-radiation with weekly Cisplatin, 12/30/2016 - 02/18/2017  He stopped tube feeding 04/16/17. He is eating by mouth now.  04/30/16 PET which shows no enlarged or hypermetabolic activity in cervical lymph nodes and no residual primary focus of head and neck disease. Has new hypermetabolic nodule in upper lobe in left lung. This favors inflammatory process.  12/08/17 PET/CT revealed There is a new focus of abnormal asymmetric uptake within the right side of tongue within SUV max of 6.65. Suspicious for recurrence of disease versus new site of disease. 2. No significant FDG uptake associated with the irregular nodule within the left lung apex which may be postinflammatory in etiology  01/13/18 MRI Neck revealed No evident mass to explain PET findings, which may have been muscular. 2. Negative for nodal disease.  2. Pulmonary nodule  Initially seen on 04/30/16 PET/CT  06/17/17 CT Chest shows stable left lung nodule, with no new developments, overall stable. Will continue to monitor closely.     09/09/17 CT Chest revealed Stable to decrease in size irregular pulmonary nodule in the posterior left lung apex, hypermetabolic on PET-CT from [id number]. Aortic Atherosclerosis  and Emphysema. Multi vessel coronary artery atherosclerotic calcifications.   12/15/17 CT Chest revealed Stable appearing left apical density, likely scar tissue given the surrounding changes. This was mildly hypermetabolic on the PET-CT from 04/29/2017 but not hypermetabolic on the more recent PET-CT from 12/08/2017. Recommend follow-up noncontrast chest CT in 6 months. 2. No enlarged mediastinal or hilar lymph nodes. 3. Stable advanced emphysematous changes and apical pulmonary scarring. No new/acute pulmonary findings. 4. Stable advanced atherosclerotic calcifications involving the thoracic aorta and branch vessels.  03/23/18 CT Chest revealed "No change in the appearance of left apical nodular density which appears confluent with areas of apical pleuroparenchymal scarring and may be postinflammatory/infectious in etiology. 2. New tiny nodule within the right lower lobe measuring 2 mm is nonspecific. Attention on follow-up imaging is advised. 3. Aortic Atherosclerosis and Emphysema 4. Multi vessel coronary artery atherosclerotic calcifications".  07/14/18 CT Neck revealed "No mass or lymphadenopathy identified. NIRADS category 1, routine surveillance. 2. Low-density mucosal thickening of the oral and hypopharynx, fatty replacement of submandibular glands, and non masslike fat stranding within submandibular/parapharyngeal compartments are compatible with posttreatment changes. 3. Stable left lung  apex pulmonary nodule."  12/28/2018 Chest CT ([id number]) revealed "1. Stable exam. No change in the appearance of left apical nodular density which appears confluent with areas of pleuroparenchymal scarring and may be postinflammatory/infectious in etiology. 2. 2 mm right lower lobe lung nodule identified on prior exam is stable to  improved in the interval. 3. Coronary artery atherosclerotic calcifications."  Plan: -Discussed pt labwork today, 07/11/19; blood counts look good, blood chemistries are nml, TSH is WNL @ 2.62 -The pt shows no clinical or lab progression/return of his tonsillar cancer at this time. -No indication for further treatment at this time. -Will continue to monitor for evolving hypothyroidism -Recommend pt watch for constipation and dry skin as a sign of thyroid dysfunction -Advised pt that intermittent fasting is not unreasonable for weight loss. Advised against any crash dieting.  -Recommend empiric OTC B-complex vitamin daily for gum inflammation -Recommend pt f/u with ENT for local examinations -Rx Peridex mouthwash -Will get CT in 24 weeks -Will see back in 6 months with labs   FOLLOW UP: CT in 24 weeks RTC with Dr Irene Limbo with labs in 6 months   The total time spent in the appt was 20 minutes and more than 50% was on counseling and direct patient cares.  All of the patient's questions were answered with apparent satisfaction. The patient knows to call the clinic with any problems, questions or concerns.   Sullivan Lone MD [place] AAHIVMS [facility] Hematology/Oncology Physician [facility]  (Office):       [phone] (Work cell):  [phone] (Fax):           [phone]  07/11/2019 1:16 PM  I, Yevette Edwards, am acting as a scribe for Dr. Sullivan Lone.   .I have reviewed the above documentation for accuracy and completeness, and I agree with the above. Brunetta Genera MD

## 2019-07-12 ENCOUNTER — Telehealth: Payer: Self-pay | Admitting: Hematology

## 2019-07-12 NOTE — Telephone Encounter (Signed)
Scheduled per 05/17 los, patient has been called and voicemail was left.

## 2019-08-04 DIAGNOSIS — H35013 Changes in retinal vascular appearance, bilateral: Secondary | ICD-10-CM | POA: Diagnosis not present

## 2019-08-04 DIAGNOSIS — H35033 Hypertensive retinopathy, bilateral: Secondary | ICD-10-CM | POA: Diagnosis not present

## 2019-08-04 DIAGNOSIS — Z961 Presence of intraocular lens: Secondary | ICD-10-CM | POA: Diagnosis not present

## 2019-08-04 DIAGNOSIS — H35371 Puckering of macula, right eye: Secondary | ICD-10-CM | POA: Diagnosis not present

## 2019-09-05 ENCOUNTER — Other Ambulatory Visit: Payer: Self-pay | Admitting: Hematology

## 2019-10-12 ENCOUNTER — Other Ambulatory Visit: Payer: Self-pay | Admitting: Family Medicine

## 2019-10-12 DIAGNOSIS — E785 Hyperlipidemia, unspecified: Secondary | ICD-10-CM

## 2019-10-17 ENCOUNTER — Other Ambulatory Visit: Payer: Self-pay

## 2019-10-17 ENCOUNTER — Ambulatory Visit (INDEPENDENT_AMBULATORY_CARE_PROVIDER_SITE_OTHER): Payer: 59 | Admitting: Family Medicine

## 2019-10-17 ENCOUNTER — Encounter: Payer: Self-pay | Admitting: Family Medicine

## 2019-10-17 VITALS — BP 137/77 | HR 68 | Temp 97.8°F | Resp 20 | Ht 69.0 in | Wt 209.4 lb

## 2019-10-17 DIAGNOSIS — I10 Essential (primary) hypertension: Secondary | ICD-10-CM

## 2019-10-17 DIAGNOSIS — I350 Nonrheumatic aortic (valve) stenosis: Secondary | ICD-10-CM | POA: Diagnosis not present

## 2019-10-17 DIAGNOSIS — E8881 Metabolic syndrome: Secondary | ICD-10-CM | POA: Diagnosis not present

## 2019-10-17 DIAGNOSIS — M1611 Unilateral primary osteoarthritis, right hip: Secondary | ICD-10-CM

## 2019-10-17 DIAGNOSIS — E785 Hyperlipidemia, unspecified: Secondary | ICD-10-CM

## 2019-10-17 DIAGNOSIS — K219 Gastro-esophageal reflux disease without esophagitis: Secondary | ICD-10-CM

## 2019-10-17 DIAGNOSIS — C099 Malignant neoplasm of tonsil, unspecified: Secondary | ICD-10-CM | POA: Diagnosis not present

## 2019-10-17 DIAGNOSIS — F411 Generalized anxiety disorder: Secondary | ICD-10-CM

## 2019-10-17 LAB — BAYER DCA HB A1C WAIVED: HB A1C (BAYER DCA - WAIVED): 5.9 % (ref <7.0)

## 2019-10-17 MED ORDER — OMEPRAZOLE 20 MG PO CPDR: 20.0000 mg | DELAYED_RELEASE_CAPSULE | Freq: Every day | ORAL | 1 refills | Status: DC

## 2019-10-17 MED ORDER — ESCITALOPRAM OXALATE 20 MG PO TABS: 10.0000 mg | ORAL_TABLET | Freq: Every day | ORAL | 1 refills | Status: DC

## 2019-10-17 MED ORDER — ATORVASTATIN CALCIUM 40 MG PO TABS: 40.0000 mg | ORAL_TABLET | Freq: Every day | ORAL | 1 refills | Status: DC

## 2019-10-17 MED ORDER — AMLODIPINE BESYLATE 10 MG PO TABS: ORAL_TABLET | ORAL | 1 refills | Status: DC

## 2019-10-17 MED ORDER — EZETIMIBE 10 MG PO TABS: 10.0000 mg | ORAL_TABLET | Freq: Every day | ORAL | 2 refills | Status: DC

## 2019-10-17 NOTE — Progress Notes (Signed)
Subjective:  Patient ID: Terry Pacheco,  male    DOB: December 26, 1950  Age: 69 y.o.    CC: Medical Management of Chronic Issues   HPI VALDIS BEVILL presents for  follow-up of hypertension. Patient has no history of headache chest pain or shortness of breath or recent cough. Patient also denies symptoms of TIA such as numbness weakness lateralizing. Patient denies side effects from medication. States taking it regularly.  Patient also  in for follow-up of elevated cholesterol. Doing well without complaints on current medication. Denies side effects  including myalgia and arthralgia and nausea. Also in today for liver function testing. Currently no chest pain, shortness of breath or other cardiovascular related symptoms noted.  Follow-up of diabetes. Patient does check blood sugar at home Patient denies symptoms such as excessive hunger or urinary frequency, excessive hunger, nausea No significant hypoglycemic spells noted. Medications reviewed. Pt reports taking them regularly. Pt. denies complication/adverse reaction today.    History Pat has a past medical history of Aortic stenosis, Arthritis, Cancer ([place]) (10/2016), GERD (gastroesophageal reflux disease) (07/06/2014), History of radiation therapy (12/30/2016- 02/18/2017), Hypertension, Sleep apnea, and Wears glasses.   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   His family history includes Heart disease in his mother; Stroke in his father.He reports that he quit smoking about 2 years ago. His smoking use  included cigarettes. He started smoking about 36 years ago. He has a 22.50 pack-year smoking history. He has never used smokeless tobacco. He reports previous alcohol use. He reports previous drug use. Drug: Marijuana.  Current Outpatient Medications on File Prior to Visit  Medication Sig Dispense Refill   acetaminophen (TYLENOL) 500 MG tablet Take 1,000 mg by mouth daily as needed for moderate pain or headache.     aspirin EC 81 MG tablet Take 81 mg by mouth daily.     b complex vitamins capsule Take 1 capsule by mouth daily. 30 capsule 2   calcium carbonate (TUMS - DOSED IN MG ELEMENTAL CALCIUM) 500 MG chewable tablet Chew 2 tablets by mouth daily as needed for indigestion or heartburn.     chlorhexidine (PERIDEX) 0.12 % solution SWISH AND SPIT 15ML IN THE MOUTH OR THROAT TWICE DAILY (Patient not taking: Reported on 10/17/2019) 473 mL 0   fluticasone (FLONASE) 50 MCG/ACT nasal spray SPRAY 2 SPRAYS INTO EACH NOSTRIL EVERY DAY (Patient not taking: Reported on 06/08/2019) 48 g 0   lidocaine (XYLOCAINE) 2 % solution Mix 1 part 2%viscous lidocaine,1part H2O.Swish and swallow 39mL of this mixture, 68min before meals and at bedtime, up to QID (Patient not taking: Reported on 06/08/2019) 100 mL 5   sodium fluoride (FLUORISHIELD) 1.1 % GEL dental gel Instill one drop of gel into each tooth space of fluoride tray. Place over teeth for 5 minutes. Remove. Spit out excess. Repeat nightly (Patient not taking: Reported on 10/17/2019) 120 mL prn   No current facility-administered medications on file prior to visit.    ROS Review of Systems  Constitutional: Negative.   HENT: Negative.   Eyes: Negative for visual disturbance.  Respiratory: Negative for cough and shortness  of breath.   Cardiovascular: Negative for chest pain and leg swelling.  Gastrointestinal: Negative for abdominal pain, diarrhea, nausea and vomiting.  Genitourinary: Negative for difficulty urinating.  Musculoskeletal: Negative for  arthralgias and myalgias.  Skin: Negative for rash.  Neurological: Negative for headaches.  Psychiatric/Behavioral: Negative for sleep disturbance.    Objective:  BP 137/77    Pulse 68    Temp 97.8 F (36.6 C) (Temporal)    Resp 20    Ht $R'5\' 9"'MT$  (1.753 m)    Wt 209 lb 6 oz (95 kg)    SpO2 95%    BMI 30.92 kg/m   BP Readings from Last 3 Encounters:  10/17/19 137/77  07/11/19 119/67  04/18/19 127/82    Wt Readings from Last 3 Encounters:  10/17/19 209 lb 6 oz (95 kg)  07/11/19 209 lb 11.2 oz (95.1 kg)  04/18/19 207 lb (93.9 kg)     Physical Exam Constitutional:      General: He is not in acute distress.    Appearance: He is well-developed.  HENT:     Head: Normocephalic and atraumatic.     Right Ear: External ear normal.     Left Ear: External ear normal.     Nose: Nose normal.  Eyes:     Conjunctiva/sclera: Conjunctivae normal.     Pupils: Pupils are equal, round, and reactive to light.  Cardiovascular:     Rate and Rhythm: Normal rate and regular rhythm.     Heart sounds: Normal heart sounds. No murmur heard.   Pulmonary:     Effort: Pulmonary effort is normal. No respiratory distress.     Breath sounds: Normal breath sounds. No wheezing or rales.  Abdominal:     Palpations: Abdomen is soft.     Tenderness: There is no abdominal tenderness.  Musculoskeletal:        General: Normal range of motion.     Cervical back: Normal range of motion and neck supple.  Skin:    General: Skin is warm and dry.  Neurological:     Mental Status: He is alert and oriented to person, place, and time.     Deep Tendon Reflexes: Reflexes are normal and symmetric.  Psychiatric:        Behavior: Behavior normal.        Thought Content: Thought content normal.        Judgment: Judgment normal.     Diabetic Foot Exam - Simple   No data filed        Assessment & Plan:   Aadvik was seen today for medical management of chronic issues.  Diagnoses and all orders for this  visit:  Essential hypertension -     CBC with Differential/Platelet -     CMP14+EGFR -     amLODipine (NORVASC) 10 MG tablet; TAKE 1 TABLET EVERY DAY FOR FOR BLOOD PRESSURE  Hyperlipidemia with target LDL less than 100 -     CBC with Differential/Platelet -     CMP14+EGFR -     Lipid panel -     atorvastatin (LIPITOR) 40 MG tablet; Take 1 tablet (40 mg total) by mouth daily.  Gastroesophageal reflux disease, unspecified whether esophagitis present -     CBC with Differential/Platelet -     CMP14+EGFR -     omeprazole (PRILOSEC) 20 MG capsule; Take 1 capsule (20 mg total) by mouth daily.  Primary osteoarthritis of right hip -     CBC with Differential/Platelet -  EZM62+HUTM  Metabolic syndrome -     Bayer DCA Hb A1c Waived -     CBC with Differential/Platelet -     CMP14+EGFR  Aortic valve stenosis, etiology of cardiac valve disease unspecified -     CBC with Differential/Platelet -     CMP14+EGFR  GAD (generalized anxiety disorder) -     escitalopram (LEXAPRO) 20 MG tablet; Take 0.5 tablets (10 mg total) by mouth daily.  Other orders -     ezetimibe (ZETIA) 10 MG tablet; Take 1 tablet (10 mg total) by mouth daily. Please schedule appt for further refills 1st attempt   I have discontinued Elyn Aquas. Percle's Liniments ([place]). I have also changed his atorvastatin. Additionally, I am having him maintain his sodium fluoride, lidocaine, fluticasone, aspirin EC, acetaminophen, calcium carbonate, b complex vitamins, chlorhexidine, amLODipine, escitalopram, ezetimibe, and omeprazole.  Meds ordered this encounter  Medications   amLODipine (NORVASC) 10 MG tablet    Sig: TAKE 1 TABLET EVERY DAY FOR FOR BLOOD PRESSURE    Dispense:  90 tablet    Refill:  1   atorvastatin (LIPITOR) 40 MG tablet    Sig: Take 1 tablet (40 mg total) by mouth daily.    Dispense:  90 tablet    Refill:  1   escitalopram (LEXAPRO) 20 MG tablet    Sig: Take 0.5 tablets (10 mg  total) by mouth daily.    Dispense:  45 tablet    Refill:  1   ezetimibe (ZETIA) 10 MG tablet    Sig: Take 1 tablet (10 mg total) by mouth daily. Please schedule appt for further refills 1st attempt    Dispense:  90 tablet    Refill:  2   omeprazole (PRILOSEC) 20 MG capsule    Sig: Take 1 capsule (20 mg total) by mouth daily.    Dispense:  90 capsule    Refill:  1     Follow-up: Return in about 6 months (around 04/18/2020).  Claretta Fraise, M.D.

## 2019-10-18 LAB — CBC WITH DIFFERENTIAL/PLATELET
Basophils Absolute: 0 10*3/uL (ref 0.0–0.2)
Basos: 1 %
EOS (ABSOLUTE): 0.4 10*3/uL (ref 0.0–0.4)
Eos: 6 %
Hematocrit: 45.7 % (ref 37.5–51.0)
Hemoglobin: 15.6 g/dL (ref 13.0–17.7)
Immature Grans (Abs): 0 10*3/uL (ref 0.0–0.1)
Immature Granulocytes: 0 %
Lymphocytes Absolute: 1.4 10*3/uL (ref 0.7–3.1)
Lymphs: 24 %
MCH: 30.5 pg (ref 26.6–33.0)
MCHC: 34.1 g/dL (ref 31.5–35.7)
MCV: 89 fL (ref 79–97)
Monocytes Absolute: 0.5 10*3/uL (ref 0.1–0.9)
Monocytes: 9 %
Neutrophils Absolute: 3.5 10*3/uL (ref 1.4–7.0)
Neutrophils: 60 %
Platelets: 189 10*3/uL (ref 150–450)
RBC: 5.12 x10E6/uL (ref 4.14–5.80)
RDW: 13.3 % (ref 11.6–15.4)
WBC: 5.9 10*3/uL (ref 3.4–10.8)

## 2019-10-18 LAB — CMP14+EGFR
ALT: 14 IU/L (ref 0–44)
AST: 20 IU/L (ref 0–40)
Albumin/Globulin Ratio: 2 (ref 1.2–2.2)
Albumin: 4.6 g/dL (ref 3.8–4.8)
Alkaline Phosphatase: 150 IU/L — ABNORMAL HIGH (ref 48–121)
BUN/Creatinine Ratio: 15 (ref 10–24)
BUN: 17 mg/dL (ref 8–27)
Bilirubin Total: 0.8 mg/dL (ref 0.0–1.2)
CO2: 22 mmol/L (ref 20–29)
Calcium: 9.2 mg/dL (ref 8.6–10.2)
Chloride: 99 mmol/L (ref 96–106)
Creatinine, Ser: 1.14 mg/dL (ref 0.76–1.27)
GFR calc Af Amer: 75 mL/min/{1.73_m2} (ref >59)
GFR calc non Af Amer: 65 mL/min/{1.73_m2} (ref >59)
Globulin, Total: 2.3 g/dL (ref 1.5–4.5)
Glucose: 98 mg/dL (ref 65–99)
Potassium: 4.7 mmol/L (ref 3.5–5.2)
Sodium: 133 mmol/L — ABNORMAL LOW (ref 134–144)
Total Protein: 6.9 g/dL (ref 6.0–8.5)

## 2019-10-18 LAB — LIPID PANEL
Chol/HDL Ratio: 2.9 ratio (ref 0.0–5.0)
Cholesterol, Total: 112 mg/dL (ref 100–199)
HDL: 38 mg/dL — ABNORMAL LOW (ref >39)
LDL Chol Calc (NIH): 56 mg/dL (ref 0–99)
Triglycerides: 95 mg/dL (ref 0–149)
VLDL Cholesterol Cal: 18 mg/dL (ref 5–40)

## 2019-10-18 NOTE — Progress Notes (Signed)
Hello Sekai,  Your lab result is normal and/or stable.Some minor variations that are not significant are commonly marked abnormal, but do not represent any medical problem for you.  Best regards, Yuri Fana, M.D.

## 2020-01-10 ENCOUNTER — Other Ambulatory Visit: Payer: Self-pay | Admitting: *Deleted

## 2020-01-10 DIAGNOSIS — C090 Malignant neoplasm of tonsillar fossa: Secondary | ICD-10-CM

## 2020-01-11 ENCOUNTER — Inpatient Hospital Stay: Payer: 59 | Attending: Hematology

## 2020-01-11 ENCOUNTER — Inpatient Hospital Stay (HOSPITAL_BASED_OUTPATIENT_CLINIC_OR_DEPARTMENT_OTHER): Payer: 59 | Admitting: Hematology

## 2020-01-11 ENCOUNTER — Other Ambulatory Visit: Payer: Self-pay

## 2020-01-11 VITALS — BP 139/76 | HR 71 | Temp 97.0°F | Resp 18 | Ht 69.0 in | Wt 209.6 lb

## 2020-01-11 DIAGNOSIS — Z85818 Personal history of malignant neoplasm of other sites of lip, oral cavity, and pharynx: Secondary | ICD-10-CM | POA: Insufficient documentation

## 2020-01-11 DIAGNOSIS — Z08 Encounter for follow-up examination after completed treatment for malignant neoplasm: Secondary | ICD-10-CM | POA: Insufficient documentation

## 2020-01-11 DIAGNOSIS — R911 Solitary pulmonary nodule: Secondary | ICD-10-CM

## 2020-01-11 DIAGNOSIS — R918 Other nonspecific abnormal finding of lung field: Secondary | ICD-10-CM

## 2020-01-11 DIAGNOSIS — C099 Malignant neoplasm of tonsil, unspecified: Secondary | ICD-10-CM | POA: Insufficient documentation

## 2020-01-11 DIAGNOSIS — C090 Malignant neoplasm of tonsillar fossa: Secondary | ICD-10-CM | POA: Diagnosis not present

## 2020-01-11 LAB — CMP (CANCER CENTER ONLY)
ALT: 13 U/L (ref 0–44)
AST: 15 U/L (ref 15–41)
Albumin: 4 g/dL (ref 3.5–5.0)
Alkaline Phosphatase: 129 U/L — ABNORMAL HIGH (ref 38–126)
Anion gap: 7 (ref 5–15)
BUN: 17 mg/dL (ref 8–23)
CO2: 27 mmol/L (ref 22–32)
Calcium: 9.2 mg/dL (ref 8.9–10.3)
Chloride: 104 mmol/L (ref 98–111)
Creatinine: 1.14 mg/dL (ref 0.61–1.24)
GFR, Estimated: >60 mL/min (ref >60)
Glucose, Bld: 102 mg/dL — ABNORMAL HIGH (ref 70–99)
Potassium: 4.1 mmol/L (ref 3.5–5.1)
Sodium: 138 mmol/L (ref 135–145)
Total Bilirubin: 0.8 mg/dL (ref 0.3–1.2)
Total Protein: 7 g/dL (ref 6.5–8.1)

## 2020-01-11 LAB — CBC WITH DIFFERENTIAL (CANCER CENTER ONLY)
Abs Immature Granulocytes: 0.01 10*3/uL (ref 0.00–0.07)
Basophils Absolute: 0 10*3/uL (ref 0.0–0.1)
Basophils Relative: 1 %
Eosinophils Absolute: 0.3 10*3/uL (ref 0.0–0.5)
Eosinophils Relative: 5 %
HCT: 44.2 % (ref 39.0–52.0)
Hemoglobin: 15.1 g/dL (ref 13.0–17.0)
Immature Granulocytes: 0 %
Lymphocytes Relative: 20 %
Lymphs Abs: 1.2 10*3/uL (ref 0.7–4.0)
MCH: 30.5 pg (ref 26.0–34.0)
MCHC: 34.2 g/dL (ref 30.0–36.0)
MCV: 89.3 fL (ref 80.0–100.0)
Monocytes Absolute: 0.5 10*3/uL (ref 0.1–1.0)
Monocytes Relative: 9 %
Neutro Abs: 3.9 10*3/uL (ref 1.7–7.7)
Neutrophils Relative %: 65 %
Platelet Count: 145 10*3/uL — ABNORMAL LOW (ref 150–400)
RBC: 4.95 MIL/uL (ref 4.22–5.81)
RDW: 12.5 % (ref 11.5–15.5)
WBC Count: 5.9 10*3/uL (ref 4.0–10.5)
nRBC: 0 % (ref 0.0–0.2)

## 2020-01-11 LAB — TSH: TSH: 2.326 u[IU]/mL (ref 0.320–4.118)

## 2020-01-11 NOTE — Progress Notes (Signed)
HEMATOLOGY/ONCOLOGY FOLLOW UP VISIT NOTE  Date of Service: 01/11/2020  Patient Care Team: Terry Fraise, MD as PCP - General (Family Medicine) Terry Marble, MD as Consulting Physician (Otolaryngology) Terry Gibson, MD as Attending Physician (Radiation Oncology) Terry Sauers, RN (Inactive) as Oncology Nurse Navigator (Oncology) Terry Pacheco, RD as Dietitian (Nutrition) Terry Pacheco, PT (Inactive) as Physical Therapist (Physical Therapy) Terry Pacheco, CCC-SLP as Speech Language Pathologist (Speech Pathology) Terry Center, LCSW as Social Worker  CHIEF COMPLAINTS/PURPOSE OF CONSULTATION:  F/u for tonsillar carcinoma Pulmonary nodule  HISTORY OF PRESENTING ILLNESS:   Terry Pacheco is a wonderful 69 y.o. male who has been referred to [place] by ENT Specialist, Dr. Erik Pacheco for evaluation and management of invasive squamous cell carcinoma of tonsil.   He has a PMHx of GERD, HTN, High cholesterol which he takes medications for. He denies PMHx of seizures. He is accompanied by his wife and sister today. He reports that he is doing well overall. The pt initially presented with sudden-onset right lateral tongue numbness that has been ongoing for approximately 1 year. Subsequently, he had left sided gum swelling and intermittent sore throat symptoms. He notes no stroke-like symptoms. He notes that following these symptoms, he was evaluated by multiple providers including an ENT specialist and a dentist prior to meeting with Dr. Erik Pacheco. Pt wife noted swelling and discoloration inside his mouth prior to the patient being evaluated by Dr. Erik Pacheco. At his first visit with Dr. Erik Pacheco, he noted an abnormality to his left tonsil and completed a biopsy that same day.   He has had CT soft tissue neck with contrast on 11/18/2016 with results showing: Fullness of the right palatine tonsil. Surrounding fat planes are ill-defined. This area is obscured by dental artifact. Possible right  tonsillar carcinoma.   The patient has had biopsy completed on 11/19/2016 with results of: "RIGHT FAUCIAL TONSIL", BIOPSY: Squamous cell carcinoma, suspicious for superficial invasion in this material. Immunostain p16 positive.   He also had a PET scan completed on 12/02/2016 with results revealing: IMPRESSION: 1. Mildly asymmetric tonsillar activity along the right tonsillar sinus, maximum SUV 7.7 as compared to the left side 5.2, suspicious in this setting for right-sided malignancy. There is also a mildly enlarged and hypermetabolic right level IIa lymph node with maximum SUV of 8.4. No evidence of other metastatic spread. 2. Faint accentuation of activity in the left lateral prostate gland apex, but low-grade. Correlate with PSA level in determining whether further workup is warranted. 3. Other imaging findings of potential clinical significance: Aortic Atherosclerosis (ICD10-I70.0) and Emphysema (ICD10-J43.9). Coronary atherosclerosis. Small type 1 hiatal hernia. Bilateral renal cysts. Colonic diverticulosis. Lastly, he had a MR Face trigeminal on 12/09/2016 with results of IMPRESSION: 1. No evidence of perineural spread of malignancy. 2. Mild asymmetry of the palatine tonsils without discrete mass. Correlate with direct visualization.   As far as surgeries, he has had a polyp on left sided throat that was removed 3-4 years ago that resulted negative for malignancy. He also has had a right hip replacement. Lastly, he has had a left inguinal hernia operation in 1978. He started smoking cigarettes at age 56 and would smoke 1 PPD while working. When he retired several years ago he smoked 0.5 PPD and has quit smoking cigarettes 1 month ago since his diagnosis. He states that he used a vape after quitting but was advised to quit via Dr. Isidore Pacheco. He occasionally consumes ETOH. 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 Denies allergies to medications at this time. He  reports that his father died of a stroke at age 79 and his mother died from cardiac issues. Mother had gastric cancer and was diagnosed in late 61's. Denies any other cancers of blood disorders in the family.    On review of systems, he denies hematuria, dysuria, or urinary retention. He reports urinary frequency over the past year. He reports bowel incontinence following a bowel movement x 1 year that has gradually improved more recently. He notes that his bowel incontinence has been intermittent. He has well formed stools without blood in stools or rectal bleeding. He denies increase in bowel movements and he has up to 1 bowel movement daily. He has had 2-3 occurrences of hemorrhoids with no recent flares. He denies mucous in his stools. He denies injuries or surgeries to his rectal area. He denies bladder incontinence. He has lower back pain that has been chronic and unchanged. He has a prior hx of diverticulitis approximately 10 years ago that was followed with colonoscopy and was then diagnosed with diverticulosis. He denies headache at this time. He is able to ambulate for prolonged periods without dyspnea on exertion. 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 He has had an audiogram completed by an ENT specialist in [place], [place]:  Surveillance   INTERIM HISTORY:    Terry Pacheco is called today for management and evaluation of his tonsillar cancer and pulmonary nodule. We are joined today by his wife, Terry Pacheco. The patient's last visit with [place] was on 07/11/2019. The pt reports that he is doing well overall.  The pt reports that he recently had some teeth pulled but has no issues with eating. Pt has had some dysphagia with certain foods like broccoli and coleslaw. This has been more noticeable in the last month. He has continued following with ENT every 6 months.   Lab results today (01/11/20) of CBC w/diff and CMP is as  follows: all values are WNL except for PLT at 145K, Glucose at 102, ALP at 129K. 01/11/2020 TSH at 2.326  On review of systems, pt reports occasional dysphagia and denies SOB, unexpected weight loss, cough and any other symptoms.   MEDICAL HISTORY:  Past Medical History:  Diagnosis Date  . Aortic stenosis    Mild to moderate by echo 11/2016  . Arthritis   . Cancer ([place]) 10/2016   cancer of the tonsil/ 35 radiation treatments/4 doses of chemo/last radiatio 02/18/2017  . GERD (gastroesophageal reflux disease) 07/06/2014  . History of radiation therapy 12/30/2016- 02/18/2017   Right Tonsil and bilateral neck/ 70 Gy in 35 fractions to gross diseasae, 63 gy in 35 fractions to high risk nodal echelons, and 56 Gy in 35 fraction to intermediate risk nodal echelons.   . Hypertension   . Sleep apnea    does not use CPAP  . Wears glasses     SURGICAL HISTORY: Past Surgical History:  Procedure Laterality Date  . HERNIA REPAIR     right inguinal hernia  . IR CV LINE INJECTION  01/14/2017  . IR FLUORO GUIDE PORT INSERTION RIGHT  12/25/2016  . IR GASTROSTOMY TUBE MOD SED  12/25/2016  . IR GASTROSTOMY TUBE REMOVAL  06/05/2017  . IR REMOVAL TUN ACCESS W/ PORT W/O FL MOD SED  03/06/2017  . IR US GUIDE VASC ACCESS RIGHT  12/25/2016  . LEFT HEART CATH AND CORONARY ANGIOGRAPHY N/A 12/07/2017   Procedure: LEFT HEART CATH AND CORONARY ANGIOGRAPHY;  Surgeon: Belva Crome, MD;  Location: [facility];  Service: Cardiovascular;  Laterality: N/A;  . polyp removal  2008   during colonoscopy  . TOTAL HIP ARTHROPLASTY Right 10/16/2015   Procedure: RIGHT TOTAL HIP ARTHROPLASTY ANTERIOR APPROACH;  Surgeon: Paralee Cancel, MD;  Location: [facility];  Service: Orthopedics;  Laterality: Right;  . vocal cord polyp      removed 2-3 years ago    SOCIAL HISTORY: Social History   Socioeconomic History  . Marital status: Married    Spouse name: Not on file  . Number of children: 1  . Years of education: Not on  file  . Highest education level: Not on file  Occupational History  . Occupation: Retired     Fish farm manager: [employer]  Tobacco Use  . Smoking status: Former Smoker    Packs/day: 0.50    Years: 45.00    Pack years: 22.50    Types: Cigarettes    Start date: 01/12/1983    Quit date: 11/17/2016    Years since quitting: 3.1  . Smokeless tobacco: Never Used  Vaping Use  . Vaping Use: Never used  Substance and Sexual Activity  . Alcohol use: Not Currently    Alcohol/week: 0.0 standard drinks  . Drug use: Not Currently    Types: Marijuana    Comment: last use 10/09/2015  . Sexual activity: Not on file  Other Topics Concern  . Not on file  Social History Narrative  . Not on file   Social Determinants of Health   Financial Resource Strain:   . Difficulty of Paying Living Expenses: Not on file  Food Insecurity:   . Worried About Charity fundraiser in the Last Year: Not on file  . Ran Out of Food in the Last Year: Not on file  Transportation Needs:   . Lack of Transportation (Medical): Not on file  . Lack of Transportation (Non-Medical): Not on file  Physical Activity:   . Days of Exercise per Week: Not on file  . Minutes of Exercise per Session: Not on file  Stress:   . Feeling of Stress : Not on file  Social Connections:   . Frequency of Communication with Friends and Family: Not on file  . Frequency of Social Gatherings with Friends and Family: Not on file  . Attends Religious Services: Not on file  . Active Member of Clubs or Organizations: Not on file  . Attends Archivist Meetings: Not on file  . Marital Status: Not on file  Intimate Partner Violence:   . Fear of Current or Ex-Partner: Not on file  . Emotionally Abused: Not on file  . Physically Abused: Not on file  . Sexually Abused: Not on file    FAMILY HISTORY: Family History  Problem Relation Age of Onset  . Heart disease Mother   . Stroke Father   . Colon cancer Neg Hx   . Esophageal  cancer Neg Hx   . Pancreatic cancer Neg Hx   . Stomach cancer Neg Hx   . Liver cancer Neg Hx     ALLERGIES:  is allergic to crestor [rosuvastatin].  MEDICATIONS:  Current Outpatient Medications  Medication Sig Dispense Refill  . acetaminophen (TYLENOL) 500 MG tablet Take 1,000 mg by mouth daily as needed for moderate pain or headache.    Marland Kitchen amLODipine (NORVASC)  10 MG tablet TAKE 1 TABLET EVERY DAY FOR FOR BLOOD PRESSURE 90 tablet 1  . aspirin EC 81 MG tablet Take 81 mg by mouth daily.    Marland Kitchen atorvastatin (LIPITOR) 40 MG tablet Take 1 tablet (40 mg total) by mouth daily. 90 tablet 1  . b complex vitamins capsule Take 1 capsule by mouth daily. 30 capsule 2  . calcium carbonate (TUMS - DOSED IN MG ELEMENTAL CALCIUM) 500 MG chewable tablet Chew 2 tablets by mouth daily as needed for indigestion or heartburn.    . escitalopram (LEXAPRO) 20 MG tablet Take 0.5 tablets (10 mg total) by mouth daily. 45 tablet 1  . ezetimibe (ZETIA) 10 MG tablet Take 1 tablet (10 mg total) by mouth daily. Please schedule appt for further refills 1st attempt 90 tablet 2  . omeprazole (PRILOSEC) 20 MG capsule Take 1 capsule (20 mg total) by mouth daily. 90 capsule 1  . chlorhexidine (PERIDEX) 0.12 % solution SWISH AND SPIT 15ML IN THE MOUTH OR THROAT TWICE DAILY (Patient not taking: Reported on 10/17/2019) 473 mL 0  . fluticasone (FLONASE) 50 MCG/ACT nasal spray SPRAY 2 SPRAYS INTO EACH NOSTRIL EVERY DAY (Patient not taking: Reported on 06/08/2019) 48 g 0  . lidocaine (XYLOCAINE) 2 % solution Mix 1 part 2%viscous lidocaine,1part H2O.Swish and swallow 52mL of this mixture, 47min before meals and at bedtime, up to QID (Patient not taking: Reported on 06/08/2019) 100 mL 5   No current facility-administered medications for this visit.    REVIEW OF SYSTEMS:   A 10+ POINT REVIEW OF SYSTEMS WAS OBTAINED including neurology, dermatology, psychiatry, cardiac, respiratory, lymph, extremities, GI, GU, Musculoskeletal, constitutional,  breasts, reproductive, HEENT.  All pertinent positives are noted in the HPI.  All others are negative.   PHYSICAL EXAMINATION: ECOG PERFORMANCE STATUS: 1 - Symptomatic but completely ambulatory Vitals:   01/11/20 1414  BP: 139/76  Pulse: 71  Resp: 18  Temp: (!) 97 F (36.1 C)  TempSrc: Tympanic  SpO2: 97%  Weight: 209 lb 9.6 oz (95.1 kg)  Height: 5\' 9"  (1.753 m)  .  GENERAL:alert, in no acute distress and comfortable SKIN: no acute rashes, no significant lesions EYES: conjunctiva are pink and non-injected, sclera anicteric OROPHARYNX: MMM, no exudates, no oropharyngeal erythema or ulceration NECK: supple, no JVD LYMPH:  no palpable lymphadenopathy in the cervical, axillary or inguinal regions LUNGS: clear to auscultation b/l with normal respiratory effort HEART: regular rate & rhythm ABDOMEN:  normoactive bowel sounds , non tender, not distended. No palpable hepatosplenomegaly.  Extremity: no pedal edema PSYCH: alert & oriented x 3 with fluent speech NEURO: no focal motor/sensory deficits  LABORATORY DATA:  I have reviewed the data as listed   CBC Latest Ref Rng & Units 01/11/2020 10/17/2019 07/11/2019  WBC 4.0 - 10.5 K/uL 5.9 5.9 5.3  Hemoglobin 13.0 - 17.0 g/dL 15.1 15.6 15.4  Hematocrit 39 - 52 % 44.2 45.7 46.3  Platelets 150 - 400 K/uL 145(L) 189 148(L)     . CMP Latest Ref Rng & Units 01/11/2020 10/17/2019 07/11/2019  Glucose 70 - 99 mg/dL 102(H) 98 80  BUN 8 - 23 mg/dL 17 17 16   Creatinine 0.61 - 1.24 mg/dL 1.14 1.14 1.15  Sodium 135 - 145 mmol/L 138 133(L) 137  Potassium 3.5 - 5.1 mmol/L 4.1 4.7 4.6  Chloride 98 - 111 mmol/L 104 99 103  CO2 22 - 32 mmol/L 27 22 25   Calcium 8.9 - 10.3 mg/dL 9.2 9.2 9.0  Total Protein 6.5 -  8.1 g/dL 7.0 6.9 6.7  Total Bilirubin 0.3 - 1.2 mg/dL 0.8 0.8 0.7  Alkaline Phos 38 - 126 U/L 129(H) 150(H) 118  AST 15 - 41 U/L 15 20 15   ALT 0 - 44 U/L 13 14 14    PATHOLOGY:    RADIOGRAPHIC STUDIES: I have personally reviewed the  radiological images as listed and agreed with the findings in the report.  No results found.  ASSESSMENT & PLAN:   69 y.o. 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 -creatinine WNL. Some concern for decreased hearing and baseline tinnitus due to previous job noise related injury. Baseline audiogram showed normal symmetric low-frequency hearing, dropping off substantially above 2000Hz .  s/p recent completion of concurrent chemo-radiation with weekly Cisplatin, 12/30/2016 - 02/18/2017  He stopped tube feeding 04/16/17. He is eating by mouth now.  04/30/16 PET which shows no enlarged or hypermetabolic activity in cervical lymph nodes and no residual primary focus of head and neck disease. Has new hypermetabolic nodule in upper lobe in left lung. This favors inflammatory process.  12/08/17 PET/CT revealed There is a new focus of abnormal asymmetric uptake within the right side of tongue within SUV max of 6.65. Suspicious for recurrence of disease versus new site of disease. 2. No significant FDG uptake associated with the irregular nodule within the left lung apex which may be postinflammatory in etiology  01/13/18 MRI Neck revealed No evident mass to explain PET findings, which may have been muscular. 2. Negative for nodal disease.  2. Pulmonary nodule  Initially seen on 04/30/16 PET/CT  06/17/17 CT Chest shows stable left lung nodule, with no new developments, overall stable. Will continue to monitor closely.    09/09/17 CT Chest revealed Stable to decrease in size irregular pulmonary nodule in the posterior left lung apex, hypermetabolic on PET-CT from 01/60/1093. Aortic Atherosclerosis  and Emphysema. Multi vessel coronary artery atherosclerotic calcifications.    12/15/17 CT Chest revealed Stable appearing left apical density, likely scar tissue given the surrounding changes. This was mildly hypermetabolic on the PET-CT from 04/29/2017 but not hypermetabolic on the more recent PET-CT from 12/08/2017. Recommend follow-up noncontrast chest CT in 6 months. 2. No enlarged mediastinal or hilar lymph nodes. 3. Stable advanced emphysematous changes and apical pulmonary scarring. No new/acute pulmonary findings. 4. Stable advanced atherosclerotic calcifications involving the thoracic aorta and branch vessels.  03/23/18 CT Chest revealed "No change in the appearance of left apical nodular density which appears confluent with areas of apical pleuroparenchymal scarring and may be postinflammatory/infectious in etiology. 2. New tiny nodule within the right lower lobe measuring 2 mm is nonspecific. Attention on follow-up imaging is advised. 3. Aortic Atherosclerosis and Emphysema 4. Multi vessel coronary artery atherosclerotic calcifications".  07/14/18 CT Neck revealed "No mass or lymphadenopathy identified. NIRADS category 1, routine surveillance. 2. Low-density mucosal thickening of the oral and hypopharynx, fatty replacement of submandibular glands, and non masslike fat stranding within submandibular/parapharyngeal compartments are compatible with posttreatment changes. 3. Stable left lung apex pulmonary nodule."  12/28/2018 Chest CT ([id number]) revealed "1. Stable exam. No change in the appearance of left apical nodular density which appears confluent with areas of pleuroparenchymal scarring and may be postinflammatory/infectious in etiology. 2. 2 mm right lower lobe lung nodule identified on prior exam  is stable to improved in the interval. 3. Coronary artery atherosclerotic calcifications."  Plan: -Discussed pt labwork today, 01/11/20; blood counts and chemistries look good, TSH is WNL. -The pt shows no clinical or lab progression/return of his tonsillar cancer at  this time.  -No indication for further treatment at this time. Will continue watchful observation. -Will continue to monitor for evolving hypothyroidism. -Recommend pt continue f/u with Dr. Constance Holster every 6 months  -Will repeat CT Chest wo contrast in 2 weeks - f/u via phone in 3 weeks -Will see back in 6 months with labs     FOLLOW UP: CT chest in 2 weeks Phone visit in 3weeks RTC with Dr Irene Limbo with labs in 6 months   The total time spent in the appt was 20 minutes and more than 50% was on counseling and direct patient cares.  All of the patient's questions were answered with apparent satisfaction. The patient knows to call the clinic with any problems, questions or concerns.   Sullivan Lone MD [place] AAHIVMS [facility] Hematology/Oncology Physician [facility]  (Office):       [phone] (Work cell):  [phone] (Fax):           [phone]  01/11/2020 3:35 PM  I, Yevette Edwards, am acting as a scribe for Dr. Sullivan Lone.   .I have reviewed the above documentation for accuracy and completeness, and I agree with the above. Brunetta Genera MD

## 2020-01-27 ENCOUNTER — Other Ambulatory Visit: Payer: Self-pay

## 2020-01-27 ENCOUNTER — Ambulatory Visit (HOSPITAL_COMMUNITY)
Admission: RE | Admit: 2020-01-27 | Discharge: 2020-01-27 | Disposition: A | Discharge status: Home / Self Care | Payer: 59 | Source: Ambulatory Visit | Attending: Hematology | Admitting: Hematology

## 2020-01-27 DIAGNOSIS — R911 Solitary pulmonary nodule: Secondary | ICD-10-CM | POA: Diagnosis not present

## 2020-01-27 DIAGNOSIS — J432 Centrilobular emphysema: Secondary | ICD-10-CM | POA: Diagnosis not present

## 2020-01-27 DIAGNOSIS — R918 Other nonspecific abnormal finding of lung field: Secondary | ICD-10-CM | POA: Diagnosis not present

## 2020-01-30 ENCOUNTER — Inpatient Hospital Stay: Payer: 59 | Attending: Hematology | Admitting: Hematology

## 2020-01-30 DIAGNOSIS — C090 Malignant neoplasm of tonsillar fossa: Secondary | ICD-10-CM

## 2020-01-30 NOTE — Progress Notes (Signed)
HEMATOLOGY/ONCOLOGY FOLLOW UP VISIT NOTE  Date of Service: 01/30/2020  Patient Care Team: Claretta Fraise, MD as PCP - General (Family Medicine) Jodi Marble, MD as Consulting Physician (Otolaryngology) Eppie Gibson, MD as Attending Physician (Radiation Oncology) Leota Sauers, RN (Inactive) as Oncology Nurse Navigator (Oncology) Karie Mainland, RD as Dietitian (Nutrition) Jomarie Longs, PT (Inactive) as Physical Therapist (Physical Therapy) Sharen Counter, CCC-SLP as Speech Language Pathologist (Speech Pathology) Kennith Center, LCSW as Social Worker  CHIEF COMPLAINTS/PURPOSE OF CONSULTATION:  F/u for tonsillar carcinoma Pulmonary nodule  HISTORY OF PRESENTING ILLNESS:   Terry Pacheco is a wonderful 69 y.o. male who has been referred to [place] by ENT Specialist, Dr. Erik Obey for evaluation and management of invasive squamous cell carcinoma of tonsil.   He has a PMHx of GERD, HTN, High cholesterol which he takes medications for. He denies PMHx of seizures. He is accompanied by his wife and sister today. He reports that he is doing well overall. The pt initially presented with sudden-onset right lateral tongue numbness that has been ongoing for approximately 1 year. Subsequently, he had left sided gum swelling and intermittent sore throat symptoms. He notes no stroke-like symptoms. He notes that following these symptoms, he was evaluated by multiple providers including an ENT specialist and a dentist prior to meeting with Dr. Erik Obey. Pt wife noted swelling and discoloration inside his mouth prior to the patient being evaluated by Dr. Erik Obey. At his first visit with Dr. Erik Obey, he noted an abnormality to his left tonsil and completed a biopsy that same day.   He has had CT soft tissue neck with contrast on 11/18/2016 with results showing: Fullness of the right palatine tonsil. Surrounding fat planes are ill-defined. This area is obscured by dental artifact. Possible right  tonsillar carcinoma.   The patient has had biopsy completed on 11/19/2016 with results of: "RIGHT FAUCIAL TONSIL", BIOPSY: Squamous cell carcinoma, suspicious for superficial invasion in this material. Immunostain p16 positive.   He also had a PET scan completed on 12/02/2016 with results revealing: IMPRESSION: 1. Mildly asymmetric tonsillar activity along the right tonsillar sinus, maximum SUV 7.7 as compared to the left side 5.2, suspicious in this setting for right-sided malignancy. There is also a mildly enlarged and hypermetabolic right level IIa lymph node with maximum SUV of 8.4. No evidence of other metastatic spread. 2. Faint accentuation of activity in the left lateral prostate gland apex, but low-grade. Correlate with PSA level in determining whether further workup is warranted. 3. Other imaging findings of potential clinical significance: Aortic Atherosclerosis (ICD10-I70.0) and Emphysema (ICD10-J43.9). Coronary atherosclerosis. Small type 1 hiatal hernia. Bilateral renal cysts. Colonic diverticulosis. Lastly, he had a MR Face trigeminal on 12/09/2016 with results of IMPRESSION: 1. No evidence of perineural spread of malignancy. 2. Mild asymmetry of the palatine tonsils without discrete mass. Correlate with direct visualization.   As far as surgeries, he has had a polyp on left sided throat that was removed 3-4 years ago that resulted negative for malignancy. He also has had a right hip replacement. Lastly, he has had a left inguinal hernia operation in 1978. He started smoking cigarettes at age 5 and would smoke 1 PPD while working. When he retired several years ago he smoked 0.5 PPD and has quit smoking cigarettes 1 month ago since his diagnosis. He states that he used a vape after quitting but was advised to quit via Dr. Isidore Moos. He occasionally consumes ETOH. 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 Denies allergies to medications at this time. He  reports that his father died of a stroke at age 28 and his mother died from cardiac issues. Mother had gastric cancer and was diagnosed in late 66's. Denies any other cancers of blood disorders in the family.    On review of systems, he denies hematuria, dysuria, or urinary retention. He reports urinary frequency over the past year. He reports bowel incontinence following a bowel movement x 1 year that has gradually improved more recently. He notes that his bowel incontinence has been intermittent. He has well formed stools without blood in stools or rectal bleeding. He denies increase in bowel movements and he has up to 1 bowel movement daily. He has had 2-3 occurrences of hemorrhoids with no recent flares. He denies mucous in his stools. He denies injuries or surgeries to his rectal area. He denies bladder incontinence. He has lower back pain that has been chronic and unchanged. He has a prior hx of diverticulitis approximately 10 years ago that was followed with colonoscopy and was then diagnosed with diverticulosis. He denies headache at this time. He is able to ambulate for prolonged periods without dyspnea on exertion. 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 He has had an audiogram completed by an ENT specialist in [place], Learned:  Surveillance   INTERIM HISTORY:   I connected with  Terry Pacheco on 01/30/20 by telephone and verified that I am speaking with the correct person using two identifiers.   I discussed the limitations of evaluation and management by telemedicine. The patient expressed understanding and agreed to proceed.  Other persons participating in the visit and their role in the encounter:       -Yevette Edwards, Medical Scribe      -Pt's wife  Patient's location: Home Provider's location: [place]  ELMO RIO is called today for management and evaluation of his tonsillar cancer and pulmonary  nodule. The patient's last visit with [place] was on 01/11/2020. The pt reports that he is doing well overall.  The pt reports that he has been well in the interim and has no new concerns.   Of note since the patient's last visit, pt has had CT Chest ([id number]) completed on 01/27/2020 with results revealing "1. Minimal biapical radiation fibrosis and/or pleuroparenchymal scarring, unchanged compared to prior, and with particular note of a somewhat prominent, nodular appearing region of the left apex measuring 1.3 x 0.8 cm, unchanged. Stability over a significant period of time indicates benign etiology. 2. Stable, benign 2 mm nodule of the central right lower lobe. 3. No evidence of metastatic disease in the chest. Continued chest surveillance as indicated by clinical oncology protocol, without specific indication for further follow-up identified on this examination. 4. Emphysema. 5. Coronary artery disease. 6. Aortic atherosclerosis. Aortic valve calcifications. Correlate with echocardiographic evidence of aortic valve dysfunction.  On review of systems, pt denies any other symptoms.    MEDICAL HISTORY:  Past Medical History:  Diagnosis Date  . Aortic stenosis    Mild to moderate by echo 11/2016  . Arthritis   . Cancer ([place]) 10/2016   cancer of the tonsil/ 35 radiation treatments/4 doses of chemo/last radiatio 02/18/2017  . GERD (gastroesophageal reflux disease) 07/06/2014  . History of radiation therapy 12/30/2016- 02/18/2017   Right Tonsil and bilateral neck/ 70 Gy in 35  fractions to gross diseasae, 63 gy in 35 fractions to high risk nodal echelons, and 56 Gy in 35 fraction to intermediate risk nodal echelons.   . Hypertension   . Sleep apnea    does not use CPAP  . Wears glasses     SURGICAL HISTORY: Past Surgical History:  Procedure Laterality Date  . HERNIA REPAIR     right inguinal hernia  . IR CV LINE INJECTION  01/14/2017  . IR FLUORO GUIDE PORT INSERTION RIGHT  12/25/2016  . IR  GASTROSTOMY TUBE MOD SED  12/25/2016  . IR GASTROSTOMY TUBE REMOVAL  06/05/2017  . IR REMOVAL TUN ACCESS W/ PORT W/O FL MOD SED  03/06/2017  . IR US GUIDE VASC ACCESS RIGHT  12/25/2016  . LEFT HEART CATH AND CORONARY ANGIOGRAPHY N/A 12/07/2017   Procedure: LEFT HEART CATH AND CORONARY ANGIOGRAPHY;  Surgeon: Belva Crome, MD;  Location: [facility];  Service: Cardiovascular;  Laterality: N/A;  . polyp removal  2008   during colonoscopy  . TOTAL HIP ARTHROPLASTY Right 10/16/2015   Procedure: RIGHT TOTAL HIP ARTHROPLASTY ANTERIOR APPROACH;  Surgeon: Paralee Cancel, MD;  Location: [facility];  Service: Orthopedics;  Laterality: Right;  . vocal cord polyp      removed 2-3 years ago    SOCIAL HISTORY: Social History   Socioeconomic History  . Marital status: Married    Spouse name: Not on file  . Number of children: 1  . Years of education: Not on file  . Highest education level: Not on file  Occupational History  . Occupation: Retired     Fish farm manager: [employer]  Tobacco Use  . Smoking status: Former Smoker    Packs/day: 0.50    Years: 45.00    Pack years: 22.50    Types: Cigarettes    Start date: 01/12/1983    Quit date: 11/17/2016    Years since quitting: 3.2  . Smokeless tobacco: Never Used  Vaping Use  . Vaping Use: Never used  Substance and Sexual Activity  . Alcohol use: Not Currently    Alcohol/week: 0.0 standard drinks  . Drug use: Not Currently    Types: Marijuana    Comment: last use 10/09/2015  . Sexual activity: Not on file  Other Topics Concern  . Not on file  Social History Narrative  . Not on file   Social Determinants of Health   Financial Resource Strain:   . Difficulty of Paying Living Expenses: Not on file  Food Insecurity:   . Worried About Charity fundraiser in the Last Year: Not on file  . Ran Out of Food in the Last Year: Not on file  Transportation Needs:   . Lack of Transportation (Medical): Not on file  . Lack of Transportation  (Non-Medical): Not on file  Physical Activity:   . Days of Exercise per Week: Not on file  . Minutes of Exercise per Session: Not on file  Stress:   . Feeling of Stress : Not on file  Social Connections:   . Frequency of Communication with Friends and Family: Not on file  . Frequency of Social Gatherings with Friends and Family: Not on file  . Attends Religious Services: Not on file  . Active Member of Clubs or Organizations: Not on file  . Attends Archivist Meetings: Not on file  . Marital Status: Not on file  Intimate Partner Violence:   . Fear of Current or Ex-Partner: Not on file  .  Emotionally Abused: Not on file  . Physically Abused: Not on file  . Sexually Abused: Not on file    FAMILY HISTORY: Family History  Problem Relation Age of Onset  . Heart disease Mother   . Stroke Father   . Colon cancer Neg Hx   . Esophageal cancer Neg Hx   . Pancreatic cancer Neg Hx   . Stomach cancer Neg Hx   . Liver cancer Neg Hx     ALLERGIES:  is allergic to crestor [rosuvastatin].  MEDICATIONS:  Current Outpatient Medications  Medication Sig Dispense Refill  . acetaminophen (TYLENOL) 500 MG tablet Take 1,000 mg by mouth daily as needed for moderate pain or headache.    Marland Kitchen amLODipine (NORVASC) 10 MG tablet TAKE 1 TABLET EVERY DAY FOR FOR BLOOD PRESSURE 90 tablet 1  . aspirin EC 81 MG tablet Take 81 mg by mouth daily.    Marland Kitchen atorvastatin (LIPITOR) 40 MG tablet Take 1 tablet (40 mg total) by mouth daily. 90 tablet 1  . b complex vitamins capsule Take 1 capsule by mouth daily. 30 capsule 2  . calcium carbonate (TUMS - DOSED IN MG ELEMENTAL CALCIUM) 500 MG chewable tablet Chew 2 tablets by mouth daily as needed for indigestion or heartburn.    . chlorhexidine (PERIDEX) 0.12 % solution SWISH AND SPIT 15ML IN THE MOUTH OR THROAT TWICE DAILY (Patient not taking: Reported on 10/17/2019) 473 mL 0  . escitalopram (LEXAPRO) 20 MG tablet Take 0.5 tablets (10 mg total) by mouth daily. 45  tablet 1  . ezetimibe (ZETIA) 10 MG tablet Take 1 tablet (10 mg total) by mouth daily. Please schedule appt for further refills 1st attempt 90 tablet 2  . fluticasone (FLONASE) 50 MCG/ACT nasal spray SPRAY 2 SPRAYS INTO EACH NOSTRIL EVERY DAY (Patient not taking: Reported on 06/08/2019) 48 g 0  . lidocaine (XYLOCAINE) 2 % solution Mix 1 part 2%viscous lidocaine,1part H2O.Swish and swallow 30mL of this mixture, 43min before meals and at bedtime, up to QID (Patient not taking: Reported on 06/08/2019) 100 mL 5  . omeprazole (PRILOSEC) 20 MG capsule Take 1 capsule (20 mg total) by mouth daily. 90 capsule 1   No current facility-administered medications for this visit.    REVIEW OF SYSTEMS:   A 10+ POINT REVIEW OF SYSTEMS WAS OBTAINED including neurology, dermatology, psychiatry, cardiac, respiratory, lymph, extremities, GI, GU, Musculoskeletal, constitutional, breasts, reproductive, HEENT.  All pertinent positives are noted in the HPI.  All others are negative.   PHYSICAL EXAMINATION: ECOG PERFORMANCE STATUS: 1 - Symptomatic but completely ambulatory There were no vitals filed for this visit..  Telehealth visit 01/30/2020  LABORATORY DATA:  I have reviewed the data as listed   CBC Latest Ref Rng & Units 01/11/2020 10/17/2019 07/11/2019  WBC 4.0 - 10.5 K/uL 5.9 5.9 5.3  Hemoglobin 13.0 - 17.0 g/dL 15.1 15.6 15.4  Hematocrit 39 - 52 % 44.2 45.7 46.3  Platelets 150 - 400 K/uL 145(L) 189 148(L)     . CMP Latest Ref Rng & Units 01/11/2020 10/17/2019 07/11/2019  Glucose 70 - 99 mg/dL 102(H) 98 80  BUN 8 - 23 mg/dL 17 17 16   Creatinine 0.61 - 1.24 mg/dL 1.14 1.14 1.15  Sodium 135 - 145 mmol/L 138 133(L) 137  Potassium 3.5 - 5.1 mmol/L 4.1 4.7 4.6  Chloride 98 - 111 mmol/L 104 99 103  CO2 22 - 32 mmol/L 27 22 25   Calcium 8.9 - 10.3 mg/dL 9.2 9.2 9.0  Total Protein 6.5 -  8.1 g/dL 7.0 6.9 6.7  Total Bilirubin 0.3 - 1.2 mg/dL 0.8 0.8 0.7  Alkaline Phos 38 - 126 U/L 129(H) 150(H) 118  AST 15 - 41  U/L 15 20 15   ALT 0 - 44 U/L 13 14 14    PATHOLOGY:    RADIOGRAPHIC STUDIES: I have personally reviewed the radiological images as listed and agreed with the findings in the report.  CT Chest Wo Contrast  Result Date: 01/28/2020 CLINICAL DATA:  Follow-up pulmonary nodule, head and neck squamous cell carcinoma EXAM: CT CHEST WITHOUT CONTRAST TECHNIQUE: Multidetector CT imaging of the chest was performed following the standard protocol without IV contrast. COMPARISON:  12/28/2018 FINDINGS: Cardiovascular: Aortic atherosclerosis. Aortic valve calcifications. Normal heart size. Three-vessel coronary artery calcifications. No pericardial effusion. Mediastinum/Nodes: No enlarged mediastinal, hilar, or axillary lymph nodes. Small hiatal hernia. Thyroid gland, trachea, and esophagus demonstrate no significant findings. Lungs/Pleura: Mild centrilobular and paraseptal emphysema. Minimal biapical radiation fibrosis and/or pleuroparenchymal scarring, unchanged compared to prior, and with particular note of a somewhat prominent, nodular appearing region of the left apex measuring 1.3 x 0.8 cm, unchanged (series 7, image 20). Stable, benign 2 mm nodule of the central right lower lobe (series 7, image 96). Mild, dependent bibasilar scarring and or atelectasis. No pleural effusion or pneumothorax. Upper Abdomen: No acute abnormality. Musculoskeletal: No chest wall mass or suspicious bone lesions identified. IMPRESSION: 1. Minimal biapical radiation fibrosis and/or pleuroparenchymal scarring, unchanged compared to prior, and with particular note of a somewhat prominent, nodular appearing region of the left apex measuring 1.3 x 0.8 cm, unchanged. Stability over a significant period of time indicates benign etiology. 2. Stable, benign 2 mm nodule of the central right lower lobe. 3. No evidence of metastatic disease in the chest. Continued chest surveillance as indicated by clinical oncology protocol, without specific  indication for further follow-up identified on this examination. 4. Emphysema. 5. Coronary artery disease. 6. Aortic atherosclerosis. Aortic valve calcifications. Correlate with echocardiographic evidence of aortic valve dysfunction. Aortic Atherosclerosis (ICD10-I70.0) and Emphysema (ICD10-J43.9). Electronically Signed   By: Eddie Candle M.D.   On: 01/28/2020 18:26    ASSESSMENT & PLAN:   69 y.o. 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 -creatinine WNL. Some concern for decreased hearing and baseline tinnitus due to previous job noise related injury. Baseline audiogram showed normal symmetric low-frequency hearing, dropping off substantially above 2000Hz .  s/p recent completion of concurrent chemo-radiation with weekly Cisplatin, 12/30/2016 - 02/18/2017  He stopped tube feeding 04/16/17. He is eating by mouth now.  04/30/16 PET which shows no enlarged or hypermetabolic activity in cervical lymph nodes and no residual primary focus of head and neck disease. Has new hypermetabolic nodule in upper lobe in left lung. This favors inflammatory process.  12/08/17 PET/CT revealed There is a new focus of abnormal asymmetric uptake within the right side of tongue within SUV max of 6.65. Suspicious for recurrence of disease versus new site of disease. 2. No significant FDG uptake associated with the irregular nodule within the left lung apex which may be postinflammatory in etiology  01/13/18 MRI Neck revealed No evident mass to explain PET findings, which may have been muscular. 2. Negative for nodal disease.  2. Pulmonary nodule  Initially seen on 04/30/16 PET/CT  06/17/17  CT Chest shows stable left lung nodule, with no new developments, overall stable. Will continue to  monitor closely.    09/09/17 CT Chest revealed Stable to decrease in size irregular pulmonary nodule in the posterior left lung apex, hypermetabolic on PET-CT from 53/61/4431. Aortic Atherosclerosis  and Emphysema. Multi vessel coronary artery atherosclerotic calcifications.   12/15/17 CT Chest revealed Stable appearing left apical density, likely scar tissue given the surrounding changes. This was mildly hypermetabolic on the PET-CT from 04/29/2017 but not hypermetabolic on the more recent PET-CT from 12/08/2017. Recommend follow-up noncontrast chest CT in 6 months. 2. No enlarged mediastinal or hilar lymph nodes. 3. Stable advanced emphysematous changes and apical pulmonary scarring. No new/acute pulmonary findings. 4. Stable advanced atherosclerotic calcifications involving the thoracic aorta and branch vessels.  03/23/18 CT Chest revealed "No change in the appearance of left apical nodular density which appears confluent with areas of apical pleuroparenchymal scarring and may be postinflammatory/infectious in etiology. 2. New tiny nodule within the right lower lobe measuring 2 mm is nonspecific. Attention on follow-up imaging is advised. 3. Aortic Atherosclerosis and Emphysema 4. Multi vessel coronary artery atherosclerotic calcifications".  07/14/18 CT Neck revealed "No mass or lymphadenopathy identified. NIRADS category 1, routine surveillance. 2. Low-density mucosal thickening of the oral and hypopharynx, fatty replacement of submandibular glands, and non masslike fat stranding within submandibular/parapharyngeal compartments are compatible with posttreatment changes. 3. Stable left lung apex pulmonary nodule."  12/28/2018 Chest CT ([id number]) revealed "1. Stable exam. No change in the appearance of left apical nodular density which appears confluent with areas of pleuroparenchymal scarring and may be postinflammatory/infectious in etiology. 2. 2 mm right lower lobe lung nodule identified on prior  exam is stable to improved in the interval. 3. Coronary artery atherosclerotic calcifications."  Plan: -Discussed 01/27/2020 CT Chest ([id number]) which revealed stable pulmonary nodule - nothing new or actionable at this time. -The pt shows no clinical or lab progression/return of his tonsillar cancer at this time. -No indication for further treatment at this time. Will continue watchful observation. -Will continue to monitor for evolving hypothyroidism. -Recommend pt continue f/u with Dr. Constance Holster every 6 months -Will see back in 21 weeks with labs   FOLLOW UP: RTC with Dr Irene Limbo with labs in 6 months   The total time spent in the appt was 15 minutes and more than 50% was on counseling and direct patient cares.  All of the patient's questions were answered with apparent satisfaction. The patient knows to call the clinic with any problems, questions or concerns.   Sullivan Lone MD [place] AAHIVMS [facility] Hematology/Oncology Physician [facility]  (Office):       [phone] (Work cell):  [phone] (Fax):           [phone]  01/30/2020 12:13 PM  I, Yevette Edwards, am acting as a scribe for Dr. Sullivan Lone.   .I have reviewed the above documentation for accuracy and completeness, and I agree with the above. Brunetta Genera MD

## 2020-02-21 ENCOUNTER — Ambulatory Visit (HOSPITAL_COMMUNITY): Payer: 59 | Attending: Cardiology

## 2020-02-21 ENCOUNTER — Other Ambulatory Visit: Payer: Self-pay

## 2020-02-21 DIAGNOSIS — I350 Nonrheumatic aortic (valve) stenosis: Secondary | ICD-10-CM | POA: Diagnosis not present

## 2020-02-21 DIAGNOSIS — E785 Hyperlipidemia, unspecified: Secondary | ICD-10-CM | POA: Insufficient documentation

## 2020-02-21 LAB — ECHOCARDIOGRAM COMPLETE
AR max vel: 0.97 cm2
AV Area VTI: 0.91 cm2
AV Area mean vel: 0.98 cm2
AV Mean grad: 27.5 mmHg
AV Peak grad: 45.6 mmHg
Ao pk vel: 3.38 m/s
Area-P 1/2: 2.24 cm2
P 1/2 time: 437 msec
S' Lateral: 2.8 cm

## 2020-04-13 ENCOUNTER — Other Ambulatory Visit: Payer: Self-pay | Admitting: Family Medicine

## 2020-04-13 DIAGNOSIS — K219 Gastro-esophageal reflux disease without esophagitis: Secondary | ICD-10-CM

## 2020-04-18 ENCOUNTER — Ambulatory Visit (INDEPENDENT_AMBULATORY_CARE_PROVIDER_SITE_OTHER): Payer: 59 | Admitting: Family Medicine

## 2020-04-18 ENCOUNTER — Encounter: Payer: Self-pay | Admitting: Family Medicine

## 2020-04-18 ENCOUNTER — Other Ambulatory Visit: Payer: Self-pay

## 2020-04-18 VITALS — BP 132/83 | HR 82 | Temp 98.2°F | Resp 20 | Ht 69.0 in | Wt 205.2 lb

## 2020-04-18 DIAGNOSIS — F411 Generalized anxiety disorder: Secondary | ICD-10-CM

## 2020-04-18 DIAGNOSIS — I10 Essential (primary) hypertension: Secondary | ICD-10-CM

## 2020-04-18 DIAGNOSIS — N401 Enlarged prostate with lower urinary tract symptoms: Secondary | ICD-10-CM | POA: Diagnosis not present

## 2020-04-18 DIAGNOSIS — R350 Frequency of micturition: Secondary | ICD-10-CM | POA: Diagnosis not present

## 2020-04-18 DIAGNOSIS — E785 Hyperlipidemia, unspecified: Secondary | ICD-10-CM

## 2020-04-18 DIAGNOSIS — D485 Neoplasm of uncertain behavior of skin: Secondary | ICD-10-CM

## 2020-04-18 LAB — CBC WITH DIFFERENTIAL/PLATELET
Basophils Absolute: 0 10*3/uL (ref 0.0–0.2)
Basos: 1 %
EOS (ABSOLUTE): 0.4 10*3/uL (ref 0.0–0.4)
Eos: 6 %
Hematocrit: 49.6 % (ref 37.5–51.0)
Hemoglobin: 16.6 g/dL (ref 13.0–17.7)
Immature Grans (Abs): 0 10*3/uL (ref 0.0–0.1)
Immature Granulocytes: 0 %
Lymphocytes Absolute: 1.4 10*3/uL (ref 0.7–3.1)
Lymphs: 23 %
MCH: 30.2 pg (ref 26.6–33.0)
MCHC: 33.5 g/dL (ref 31.5–35.7)
MCV: 90 fL (ref 79–97)
Monocytes Absolute: 0.7 10*3/uL (ref 0.1–0.9)
Monocytes: 11 %
Neutrophils Absolute: 3.5 10*3/uL (ref 1.4–7.0)
Neutrophils: 59 %
Platelets: 185 10*3/uL (ref 150–450)
RBC: 5.49 x10E6/uL (ref 4.14–5.80)
RDW: 13.3 % (ref 11.6–15.4)
WBC: 6 10*3/uL (ref 3.4–10.8)

## 2020-04-18 LAB — CMP14+EGFR
ALT: 21 IU/L (ref 0–44)
AST: 19 IU/L (ref 0–40)
Albumin/Globulin Ratio: 1.9 (ref 1.2–2.2)
Albumin: 4.5 g/dL (ref 3.8–4.8)
Alkaline Phosphatase: 138 IU/L — ABNORMAL HIGH (ref 44–121)
BUN/Creatinine Ratio: 13 (ref 10–24)
BUN: 14 mg/dL (ref 8–27)
Bilirubin Total: 0.6 mg/dL (ref 0.0–1.2)
CO2: 22 mmol/L (ref 20–29)
Calcium: 9.2 mg/dL (ref 8.6–10.2)
Chloride: 100 mmol/L (ref 96–106)
Creatinine, Ser: 1.11 mg/dL (ref 0.76–1.27)
GFR calc Af Amer: 78 mL/min/{1.73_m2} (ref >59)
GFR calc non Af Amer: 67 mL/min/{1.73_m2} (ref >59)
Globulin, Total: 2.4 g/dL (ref 1.5–4.5)
Glucose: 111 mg/dL — ABNORMAL HIGH (ref 65–99)
Potassium: 4.4 mmol/L (ref 3.5–5.2)
Sodium: 137 mmol/L (ref 134–144)
Total Protein: 6.9 g/dL (ref 6.0–8.5)

## 2020-04-18 LAB — LIPID PANEL
Chol/HDL Ratio: 3 ratio (ref 0.0–5.0)
Cholesterol, Total: 125 mg/dL (ref 100–199)
HDL: 41 mg/dL (ref >39)
LDL Chol Calc (NIH): 63 mg/dL (ref 0–99)
Triglycerides: 114 mg/dL (ref 0–149)
VLDL Cholesterol Cal: 21 mg/dL (ref 5–40)

## 2020-04-18 MED ORDER — AMLODIPINE BESYLATE 10 MG PO TABS: ORAL_TABLET | ORAL | 1 refills | Status: DC

## 2020-04-18 MED ORDER — ATORVASTATIN CALCIUM 40 MG PO TABS: 40.0000 mg | ORAL_TABLET | Freq: Every day | ORAL | 1 refills | Status: DC

## 2020-04-18 MED ORDER — TAMSULOSIN HCL 0.4 MG PO CAPS: 0.8000 mg | ORAL_CAPSULE | Freq: Every day | ORAL | 3 refills | Status: DC

## 2020-04-18 MED ORDER — ESCITALOPRAM OXALATE 20 MG PO TABS: 10.0000 mg | ORAL_TABLET | Freq: Every day | ORAL | 1 refills | Status: DC

## 2020-04-18 NOTE — Progress Notes (Signed)
Subjective:  Patient ID: Terry Pacheco, male    DOB: Jan 14, 1951  Age: 70 y.o. MRN: [id number]  CC: Medical Management of Chronic Issues   HPI Terry Pacheco presents for follow-up of elevated cholesterol. Doing well without complaints on current medication. Denies side effects of statin including myalgia and arthralgia and nausea. Taking Zetia also. Also in today for liver function testing. Currently no chest pain, shortness of breath or other cardiovascular related symptoms noted.   presents for  follow-up of hypertension. Patient has no history of headache chest pain or shortness of breath or recent cough. Patient also denies symptoms of TIA such as focal numbness or weakness. Patient denies side effects from medication. States taking it regularly.  Patient in for follow-up of GERD. Currently asymptomatic taking  PPI daily. There is no chest pain or heartburn. No hematemesis and no melena. No dysphagia or choking. Onset is remote. Progression is stable. Complicating factors, none.  Depression screen [facility] 2/9 04/18/2020 10/17/2019 04/18/2019 04/15/2018 10/13/2017  Decreased Interest 0 0 0 0 0  Down, Depressed, Hopeless 0 0 0 0 0  PHQ - 2 Score 0 0 0 0 0  Some recent data might be hidden    History Terry Pacheco has a past medical history of Aortic stenosis, Arthritis, Cancer ([place]) (10/2016), GERD (gastroesophageal reflux disease) (07/06/2014), History of radiation therapy (12/30/2016- 02/18/2017), Hypertension, Sleep apnea, and Wears glasses.   He has a past surgical history that includes Hernia repair; vocal cord polyp ; Total hip arthroplasty (Right, 10/16/2015); polyp removal (2008); IR FLUORO GUIDE PORT INSERTION RIGHT (12/25/2016); IR US Guide Vasc Access Right (12/25/2016); IR GASTROSTOMY TUBE MOD SED (12/25/2016); IR CV Line Injection (01/14/2017); IR REMOVAL TUN ACCESS W/ PORT W/O FL MOD SED (03/06/2017); IR GASTROSTOMY TUBE REMOVAL/REPAIR (06/05/2017); and LEFT HEART CATH AND CORONARY ANGIOGRAPHY (N/A,  12/07/2017).   His family history includes Heart disease in his mother; Stroke in his father.He reports that he quit smoking about 3 years ago. His smoking use included cigarettes. He started smoking about 37 years ago. He has a 22.50 pack-year smoking history. He has never used smokeless tobacco. He reports previous alcohol use. He reports previous drug use. Drug: Marijuana.  Current Outpatient Medications on File Prior to Visit  Medication Sig Dispense Refill  . acetaminophen (TYLENOL) 500 MG tablet Take 1,000 mg by mouth daily as needed for moderate pain or headache.    Marland Kitchen aspirin EC 81 MG tablet Take 81 mg by mouth daily.    Marland Kitchen b complex vitamins capsule Take 1 capsule by mouth daily. 30 capsule 2  . ezetimibe (ZETIA) 10 MG tablet Take 1 tablet (10 mg total) by mouth daily. Please schedule appt for further refills 1st attempt 90 tablet 2  . omeprazole (PRILOSEC) 20 MG capsule TAKE 1 CAPSULE BY MOUTH EVERY DAY 90 capsule 0  . calcium carbonate (TUMS - DOSED IN MG ELEMENTAL CALCIUM) 500 MG chewable tablet Chew 2 tablets by mouth daily as needed for indigestion or heartburn. (Patient not taking: Reported on 04/18/2020)    . fluticasone (FLONASE) 50 MCG/ACT nasal spray SPRAY 2 SPRAYS INTO EACH NOSTRIL EVERY DAY (Patient not taking: No sig reported) 48 g 0   No current facility-administered medications on file prior to visit.    ROS Review of Systems  Constitutional: Negative for fever.  Respiratory: Negative for shortness of breath.   Cardiovascular: Negative for chest pain.  Genitourinary: Positive for frequency (nocturia X 1/night). Negative for dysuria.  Musculoskeletal: Negative for arthralgias.  Skin: Negative for rash.    Objective:  BP 132/83   Pulse 82   Temp 98.2 F (36.8 C) (Temporal)   Resp 20   Ht _0  (1.753 m)   Wt 205 lb 4 oz (93.1 kg)   SpO2 95%   BMI 30.31 kg/m   BP Readings from Last 3 Encounters:  04/18/20 132/83  01/11/20 139/76  10/17/19 137/77    Wt  Readings from Last 3 Encounters:  04/18/20 205 lb 4 oz (93.1 kg)  01/11/20 209 lb 9.6 oz (95.1 kg)  10/17/19 209 lb 6 oz (95 kg)     Physical Exam Vitals reviewed.  Constitutional:      Appearance: He is well-developed and well-nourished.  HENT:     Head: Normocephalic and atraumatic.     Right Ear: Tympanic membrane and external ear normal. No decreased hearing noted.     Left Ear: Tympanic membrane and external ear normal. No decreased hearing noted.     Mouth/Throat:     Pharynx: No oropharyngeal exudate or posterior oropharyngeal erythema.  Eyes:     Pupils: Pupils are equal, round, and reactive to light.  Cardiovascular:     Rate and Rhythm: Normal rate and regular rhythm.     Heart sounds: No murmur heard.   Pulmonary:     Effort: No respiratory distress.     Breath sounds: Normal breath sounds.  Abdominal:     General: Bowel sounds are normal.     Palpations: Abdomen is soft. There is no mass.     Tenderness: There is no abdominal tenderness.  Musculoskeletal:     Cervical back: Normal range of motion and neck supple.     Lab Results  Component Value Date   HGBA1C 5.9 10/17/2019   HGBA1C 5.2 02/08/2013    Lab Results  Component Value Date   WBC 5.9 01/11/2020   HGB 15.1 01/11/2020   HCT 44.2 01/11/2020   PLT 145 (L) 01/11/2020   GLUCOSE 102 (H) 01/11/2020   CHOL 112 10/17/2019   TRIG 95 10/17/2019   HDL 38 (L) 10/17/2019   LDLCALC 56 10/17/2019   ALT 13 01/11/2020   AST 15 01/11/2020   NA 138 01/11/2020   K 4.1 01/11/2020   CL 104 01/11/2020   CREATININE 1.14 01/11/2020   BUN 17 01/11/2020   CO2 27 01/11/2020   TSH 2.326 01/11/2020   INR 1.01 03/06/2017   HGBA1C 5.9 10/17/2019    CT Chest Wo Contrast  Result Date: 01/28/2020 CLINICAL DATA:  Follow-up pulmonary nodule, head and neck squamous cell carcinoma EXAM: CT CHEST WITHOUT CONTRAST TECHNIQUE: Multidetector CT imaging of the chest was performed following the standard protocol without IV  contrast. COMPARISON:  12/28/2018 FINDINGS: Cardiovascular: Aortic atherosclerosis. Aortic valve calcifications. Normal heart size. Three-vessel coronary artery calcifications. No pericardial effusion. Mediastinum/Nodes: No enlarged mediastinal, hilar, or axillary lymph nodes. Small hiatal hernia. Thyroid gland, trachea, and esophagus demonstrate no significant findings. Lungs/Pleura: Mild centrilobular and paraseptal emphysema. Minimal biapical radiation fibrosis and/or pleuroparenchymal scarring, unchanged compared to prior, and with particular note of a somewhat prominent, nodular appearing region of the left apex measuring 1.3 x 0.8 cm, unchanged (series 7, image 20). Stable, benign 2 mm nodule of the central right lower lobe (series 7, image 96). Mild, dependent bibasilar scarring and or atelectasis. No pleural effusion or pneumothorax. Upper Abdomen: No acute abnormality. Musculoskeletal: No chest wall mass or suspicious bone lesions identified. IMPRESSION: 1. Minimal biapical radiation fibrosis and/or pleuroparenchymal scarring,  unchanged compared to prior, and with particular note of a somewhat prominent, nodular appearing region of the left apex measuring 1.3 x 0.8 cm, unchanged. Stability over a significant period of time indicates benign etiology. 2. Stable, benign 2 mm nodule of the central right lower lobe. 3. No evidence of metastatic disease in the chest. Continued chest surveillance as indicated by clinical oncology protocol, without specific indication for further follow-up identified on this examination. 4. Emphysema. 5. Coronary artery disease. 6. Aortic atherosclerosis. Aortic valve calcifications. Correlate with echocardiographic evidence of aortic valve dysfunction. Aortic Atherosclerosis (ICD10-I70.0) and Emphysema (ICD10-J43.9). Electronically Signed   By: Eddie Candle M.D.   On: 01/28/2020 18:26    Assessment & Plan:   Terry Pacheco was seen today for medical management of chronic  issues.  Diagnoses and all orders for this visit:  Primary hypertension -     amLODipine (NORVASC) 10 MG tablet; TAKE 1 TABLET EVERY DAY FOR FOR BLOOD PRESSURE -     CBC with Differential/Platelet -     CMP14+EGFR  Hyperlipidemia with target LDL less than 100 -     atorvastatin (LIPITOR) 40 MG tablet; Take 1 tablet (40 mg total) by mouth daily. -     CBC with Differential/Platelet -     CMP14+EGFR -     Lipid panel  GAD (generalized anxiety disorder) -     escitalopram (LEXAPRO) 20 MG tablet; Take 0.5 tablets (10 mg total) by mouth daily. -     CBC with Differential/Platelet -     CMP14+EGFR  Essential hypertension -     amLODipine (NORVASC) 10 MG tablet; TAKE 1 TABLET EVERY DAY FOR FOR BLOOD PRESSURE -     CBC with Differential/Platelet -     CMP14+EGFR  Benign prostatic hyperplasia with urinary frequency -     tamsulosin (FLOMAX) 0.4 MG CAPS capsule; Take 2 capsules (0.8 mg total) by mouth at bedtime. For urine flow and prostate -     CBC with Differential/Platelet -     CMP14+EGFR  Neoplasm of uncertain behavior of skin -     Ambulatory referral to Dermatology -     CMP14+EGFR   I have discontinued Elyn Aquas. Cronce's lidocaine and chlorhexidine. I am also having him start on tamsulosin. Additionally, I am having him maintain his fluticasone, aspirin EC, acetaminophen, calcium carbonate, b complex vitamins, ezetimibe, omeprazole, escitalopram, amLODipine, and atorvastatin.  Meds ordered this encounter  Medications  . escitalopram (LEXAPRO) 20 MG tablet    Sig: Take 0.5 tablets (10 mg total) by mouth daily.    Dispense:  45 tablet    Refill:  1  . amLODipine (NORVASC) 10 MG tablet    Sig: TAKE 1 TABLET EVERY DAY FOR FOR BLOOD PRESSURE    Dispense:  90 tablet    Refill:  1  . atorvastatin (LIPITOR) 40 MG tablet    Sig: Take 1 tablet (40 mg total) by mouth daily.    Dispense:  90 tablet    Refill:  1  . tamsulosin (FLOMAX) 0.4 MG CAPS capsule    Sig: Take 2 capsules  (0.8 mg total) by mouth at bedtime. For urine flow and prostate    Dispense:  180 capsule    Refill:  3     Follow-up: Return in about 6 months (around 10/16/2020).  Claretta Fraise, M.D.

## 2020-04-23 NOTE — Progress Notes (Signed)
Hello Sargon,  Your lab result is normal and/or stable.Some minor variations that are not significant are commonly marked abnormal, but do not represent any medical problem for you.  Best regards, Tiena Manansala, M.D.

## 2020-05-12 ENCOUNTER — Other Ambulatory Visit: Payer: Self-pay | Admitting: Hematology

## 2020-05-15 ENCOUNTER — Ambulatory Visit (INDEPENDENT_AMBULATORY_CARE_PROVIDER_SITE_OTHER): Payer: 59 | Admitting: Dermatology

## 2020-05-15 ENCOUNTER — Encounter: Payer: Self-pay | Admitting: Dermatology

## 2020-05-15 ENCOUNTER — Other Ambulatory Visit: Payer: Self-pay

## 2020-05-15 DIAGNOSIS — C44329 Squamous cell carcinoma of skin of other parts of face: Secondary | ICD-10-CM | POA: Diagnosis not present

## 2020-05-15 DIAGNOSIS — L821 Other seborrheic keratosis: Secondary | ICD-10-CM | POA: Diagnosis not present

## 2020-05-15 DIAGNOSIS — Z1283 Encounter for screening for malignant neoplasm of skin: Secondary | ICD-10-CM

## 2020-05-15 DIAGNOSIS — D485 Neoplasm of uncertain behavior of skin: Secondary | ICD-10-CM

## 2020-05-15 NOTE — Patient Instructions (Signed)

## 2020-05-15 NOTE — Progress Notes (Signed)
Juliann Pulse with patient wife

## 2020-05-22 ENCOUNTER — Telehealth: Payer: Self-pay | Admitting: Dermatology

## 2020-05-22 ENCOUNTER — Telehealth: Payer: Self-pay | Admitting: Hematology

## 2020-05-22 NOTE — Telephone Encounter (Signed)
R/s appts per 3/29 sch msg. Pt's wife is aware.

## 2020-05-22 NOTE — Telephone Encounter (Signed)
Patient's wife, Juliann Pulse, is calling back to say that the results are not in [facility] yet and would like to review them prior to speaking to someone about th results.  Juliann Pulse gave new contact phone # [phone].

## 2020-05-22 NOTE — Telephone Encounter (Signed)
Patient wife Juliann Pulse) thought she could see pathology result.  - informed her we are waiting on results to be reviewed.  Patient understood.

## 2020-05-22 NOTE — Telephone Encounter (Signed)
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  The highest cure it is with the type of removal called Mohs surgery, this can be arranged with Dr. Velia Meyer in [place] or either Dr. Danielle Dess or Dr. Link Snuffer or possibly the new Mohs surgeon with that practice from [place] here in [place].  We will help arrange the soonest appointment at which ever location they prefer.

## 2020-05-22 NOTE — Telephone Encounter (Signed)
Called wife Juliann Pulse to let her know that the pathology is back but hasn't been reviewed.  I told her we would call back once its reviewed by Dr. Denna Haggard

## 2020-05-22 NOTE — Telephone Encounter (Signed)
Patient's wife, Juliann Pulse, is calling for pathology results from last visit with Lavonna Monarch, MD.

## 2020-05-23 ENCOUNTER — Encounter: Payer: Self-pay | Admitting: Hematology

## 2020-05-23 ENCOUNTER — Telehealth: Payer: Self-pay | Admitting: *Deleted

## 2020-05-23 NOTE — Telephone Encounter (Signed)
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 Informed wife to consult his oncologist.

## 2020-05-23 NOTE — Telephone Encounter (Signed)
-----   Message from Lavonna Monarch, MD sent at 05/23/2020  4:40 AM EDT ----- Schedule Mohs

## 2020-05-23 NOTE — Telephone Encounter (Signed)
Pathology to patient- referral sent to skin surgery center for MOHS 

## 2020-05-27 ENCOUNTER — Encounter: Payer: Self-pay | Admitting: Dermatology

## 2020-05-27 NOTE — Progress Notes (Signed)
   Follow-Up Visit   Subjective  Terry Pacheco is a 70 y.o. male who presents for the following: Skin Problem (X 3 months KA right forehead, raised spots on the face).  New growth on forehead Location:  Duration:  Quality:  Associated Signs/Symptoms: Modifying Factors:  Severity:  Timing: Context: Would like other areas checked. Objective  Well appearing patient in no apparent distress; mood and affect are within normal limits. Objective  Right Mid Forehead: Centrally eroded 1.5 cm nodule; basal cell carcinoma.       Objective  Right Lower Back: Brown textured flattopped 6 mm papule  Objective  Mid Back: No atypical pigmented lesions.    All skin waist up examined.   Assessment & Plan    Neoplasm of uncertain behavior of skin Right Mid Forehead  Skin / nail biopsy Type of biopsy: tangential   Informed consent: discussed and consent obtained   Timeout: patient name, date of birth, surgical site, and procedure verified   Procedure prep:  Patient was prepped and draped in usual sterile fashion (Non sterile) Prep type:  Chlorhexidine Anesthesia: the lesion was anesthetized in a standard fashion   Anesthetic:  1% lidocaine w/ epinephrine 1-100,000 local infiltration Instrument used: flexible razor blade   Hemostasis achieved with: ferric subsulfate   Outcome: patient tolerated procedure well   Post-procedure details: sterile dressing applied and wound care instructions given   Dressing type: bandage and petrolatum    Specimen 1 - Surgical pathology Differential Diagnosis: R/O BCC vs SCC  Check Margins: No  Seborrheic keratosis Right Lower Back  Benign okay to leave if stable.  Encounter for screening for malignant neoplasm of skin Mid Back  Annual skin examination      I, Lavonna Monarch, MD, have reviewed all documentation for this visit.  The documentation on 05/27/20 for the exam, diagnosis, procedures, and orders are all accurate and  complete.

## 2020-07-01 DIAGNOSIS — K146 Glossodynia: Secondary | ICD-10-CM | POA: Diagnosis not present

## 2020-07-09 ENCOUNTER — Other Ambulatory Visit: Payer: Self-pay | Admitting: Family Medicine

## 2020-07-09 DIAGNOSIS — K219 Gastro-esophageal reflux disease without esophagitis: Secondary | ICD-10-CM

## 2020-07-11 ENCOUNTER — Other Ambulatory Visit: Payer: 59

## 2020-07-11 ENCOUNTER — Ambulatory Visit: Payer: 59 | Admitting: Hematology

## 2020-07-19 NOTE — Progress Notes (Incomplete)
HEMATOLOGY/ONCOLOGY FOLLOW UP VISIT NOTE  Date of Service: 07/19/2020  Patient Care Team: Claretta Fraise, MD as PCP - General (Family Medicine) Jodi Marble, MD as Consulting Physician (Otolaryngology) Eppie Gibson, MD as Attending Physician (Radiation Oncology) Leota Sauers, RN (Inactive) as Oncology Nurse Navigator (Oncology) Karie Mainland, RD as Dietitian (Nutrition) Jomarie Longs, PT (Inactive) as Physical Therapist (Physical Therapy) Sharen Counter, CCC-SLP as Speech Language Pathologist (Speech Pathology) Kennith Center, LCSW as Social Worker Lavonna Monarch, MD as Consulting Physician (Dermatology)  CHIEF COMPLAINTS/PURPOSE OF CONSULTATION:  F/u for tonsillar carcinoma Pulmonary nodule  HISTORY OF PRESENTING ILLNESS:   Terry Pacheco is a wonderful 70 y.o. male who has been referred to [place] by ENT Specialist, Dr. Erik Obey for evaluation and management of invasive squamous cell carcinoma of tonsil.   He has a PMHx of GERD, HTN, High cholesterol which he takes medications for. He denies PMHx of seizures. He is accompanied by his wife and sister today. He reports that he is doing well overall. The pt initially presented with sudden-onset right lateral tongue numbness that has been ongoing for approximately 1 year. Subsequently, he had left sided gum swelling and intermittent sore throat symptoms. He notes no stroke-like symptoms. He notes that following these symptoms, he was evaluated by multiple providers including an ENT specialist and a dentist prior to meeting with Dr. Erik Obey. Pt wife noted swelling and discoloration inside his mouth prior to the patient being evaluated by Dr. Erik Obey. At his first visit with Dr. Erik Obey, he noted an abnormality to his left tonsil and completed a biopsy that same day.   He has had CT soft tissue neck with contrast on 11/18/2016 with results showing: Fullness of the right palatine tonsil. Surrounding fat planes are ill-defined. This  area is obscured by dental artifact. Possible right tonsillar carcinoma.   The patient has had biopsy completed on 11/19/2016 with results of: "RIGHT FAUCIAL TONSIL", BIOPSY: Squamous cell carcinoma, suspicious for superficial invasion in this material. Immunostain p16 positive.   He also had a PET scan completed on 12/02/2016 with results revealing: IMPRESSION: 1. Mildly asymmetric tonsillar activity along the right tonsillar sinus, maximum SUV 7.7 as compared to the left side 5.2, suspicious in this setting for right-sided malignancy. There is also a mildly enlarged and hypermetabolic right level IIa lymph node with maximum SUV of 8.4. No evidence of other metastatic spread. 2. Faint accentuation of activity in the left lateral prostate gland apex, but low-grade. Correlate with PSA level in determining whether further workup is warranted. 3. Other imaging findings of potential clinical significance: Aortic Atherosclerosis (ICD10-I70.0) and Emphysema (ICD10-J43.9). Coronary atherosclerosis. Small type 1 hiatal hernia. Bilateral renal cysts. Colonic diverticulosis. Lastly, he had a MR Face trigeminal on 12/09/2016 with results of IMPRESSION: 1. No evidence of perineural spread of malignancy. 2. Mild asymmetry of the palatine tonsils without discrete mass. Correlate with direct visualization.   As far as surgeries, he has had a polyp on left sided throat that was removed 3-4 years ago that resulted negative for malignancy. He also has had a right hip replacement. Lastly, he has had a left inguinal hernia operation in 1978. He started smoking cigarettes at age 61 and would smoke 1 PPD while working. When he retired several years ago he smoked 0.5 PPD and has quit smoking cigarettes 1 month ago since his diagnosis. He states that he used a vape after quitting but was advised to quit via Dr. Isidore Moos. He occasionally consumes  ETOH. 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  Denies allergies to medications at this time. He reports that his father died of a stroke at age 60 and his mother died from cardiac issues. Mother had gastric cancer and was diagnosed in late 1's. Denies any other cancers of blood disorders in the family.    On review of systems, he denies hematuria, dysuria, or urinary retention. He reports urinary frequency over the past year. He reports bowel incontinence following a bowel movement x 1 year that has gradually improved more recently. He notes that his bowel incontinence has been intermittent. He has well formed stools without blood in stools or rectal bleeding. He denies increase in bowel movements and he has up to 1 bowel movement daily. He has had 2-3 occurrences of hemorrhoids with no recent flares. He denies mucous in his stools. He denies injuries or surgeries to his rectal area. He denies bladder incontinence. He has lower back pain that has been chronic and unchanged. He has a prior hx of diverticulitis approximately 10 years ago that was followed with colonoscopy and was then diagnosed with diverticulosis. He denies headache at this time. He is able to ambulate for prolonged periods without dyspnea on exertion. 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 He has had an audiogram completed by an ENT specialist in [place], [place]:  Surveillance   INTERIM HISTORY:    Terry Pacheco is called today for management and evaluation of his tonsillar cancer and pulmonary nodule. The patient's last visit with [place] was on 01/30/2020. The pt reports that he is doing well overall.  The pt reports ***  Of note since the patient's last visit, pt has had ***  Lab results today 07/20/2020 of CBC w/diff and CMP is as follows: all values are WNL except for ***  On review of systems, pt reports *** and denies *** and any other symptoms.   MEDICAL HISTORY:  Past Medical History:  Diagnosis  Date  . Aortic stenosis    Mild to moderate by echo 11/2016  . Arthritis   . Cancer ([place]) 10/2016   cancer of the tonsil/ 35 radiation treatments/4 doses of chemo/last radiatio 02/18/2017  . GERD (gastroesophageal reflux disease) 07/06/2014  . History of radiation therapy 12/30/2016- 02/18/2017   Right Tonsil and bilateral neck/ 70 Gy in 35 fractions to gross diseasae, 63 gy in 35 fractions to high risk nodal echelons, and 56 Gy in 35 fraction to intermediate risk nodal echelons.   . Hypertension   . Sleep apnea    does not use CPAP  . Wears glasses     SURGICAL HISTORY: Past Surgical History:  Procedure Laterality Date  . HERNIA REPAIR     right inguinal hernia  . IR CV LINE INJECTION  01/14/2017  . IR FLUORO GUIDE PORT INSERTION RIGHT  12/25/2016  . IR GASTROSTOMY TUBE MOD SED  12/25/2016  . IR GASTROSTOMY TUBE REMOVAL  06/05/2017  . IR REMOVAL TUN ACCESS W/ PORT W/O FL MOD SED  03/06/2017  . IR US GUIDE VASC ACCESS RIGHT  12/25/2016  . LEFT HEART CATH AND CORONARY ANGIOGRAPHY N/A 12/07/2017   Procedure: LEFT HEART CATH AND CORONARY ANGIOGRAPHY;  Surgeon: Belva Crome, MD;  Location: [facility];  Service: Cardiovascular;  Laterality: N/A;  . polyp removal  2008   during colonoscopy  .  TOTAL HIP ARTHROPLASTY Right 10/16/2015   Procedure: RIGHT TOTAL HIP ARTHROPLASTY ANTERIOR APPROACH;  Surgeon: Paralee Cancel, MD;  Location: [facility];  Service: Orthopedics;  Laterality: Right;  . vocal cord polyp      removed 2-3 years ago    SOCIAL HISTORY: Social History   Socioeconomic History  . Marital status: Married    Spouse name: Not on file  . Number of children: 1  . Years of education: Not on file  . Highest education level: Not on file  Occupational History  . Occupation: Retired     Fish farm manager: [employer]  Tobacco Use  . Smoking status: Former Smoker    Packs/day: 0.50    Years: 45.00    Pack years: 22.50    Types: Cigarettes    Start date: 01/12/1983    Quit  date: 11/17/2016    Years since quitting: 3.6  . Smokeless tobacco: Never Used  Vaping Use  . Vaping Use: Never used  Substance and Sexual Activity  . Alcohol use: Not Currently    Alcohol/week: 0.0 standard drinks  . Drug use: Not Currently    Types: Marijuana    Comment: last use 10/09/2015  . Sexual activity: Not on file  Other Topics Concern  . Not on file  Social History Narrative  . Not on file   Social Determinants of Health   Financial Resource Strain: Not on file  Food Insecurity: Not on file  Transportation Needs: Not on file  Physical Activity: Not on file  Stress: Not on file  Social Connections: Not on file  Intimate Partner Violence: Not on file    FAMILY HISTORY: Family History  Problem Relation Age of Onset  . Heart disease Mother   . Stroke Father   . Colon cancer Neg Hx   . Esophageal cancer Neg Hx   . Pancreatic cancer Neg Hx   . Stomach cancer Neg Hx   . Liver cancer Neg Hx     ALLERGIES:  is allergic to crestor [rosuvastatin].  MEDICATIONS:  Current Outpatient Medications  Medication Sig Dispense Refill  . acetaminophen (TYLENOL) 500 MG tablet Take 1,000 mg by mouth daily as needed for moderate pain or headache.    Marland Kitchen amLODipine (NORVASC) 10 MG tablet TAKE 1 TABLET EVERY DAY FOR FOR BLOOD PRESSURE 90 tablet 1  . aspirin EC 81 MG tablet Take 81 mg by mouth daily.    Marland Kitchen atorvastatin (LIPITOR) 40 MG tablet Take 1 tablet (40 mg total) by mouth daily. 90 tablet 1  . B Complex CAPS TAKE 1 CAPSULE BY MOUTH EVERY DAY 100 capsule 2  . calcium carbonate (TUMS - DOSED IN MG ELEMENTAL CALCIUM) 500 MG chewable tablet Chew 2 tablets by mouth daily as needed for indigestion or heartburn.    . escitalopram (LEXAPRO) 20 MG tablet Take 0.5 tablets (10 mg total) by mouth daily. 45 tablet 1  . ezetimibe (ZETIA) 10 MG tablet Take 1 tablet (10 mg total) by mouth daily. Please schedule appt for further refills 1st attempt 90 tablet 2  . fluticasone (FLONASE) 50 MCG/ACT  nasal spray SPRAY 2 SPRAYS INTO EACH NOSTRIL EVERY DAY 48 g 0  . omeprazole (PRILOSEC) 20 MG capsule TAKE 1 CAPSULE BY MOUTH EVERY DAY 90 capsule 0  . tamsulosin (FLOMAX) 0.4 MG CAPS capsule Take 2 capsules (0.8 mg total) by mouth at bedtime. For urine flow and prostate 180 capsule 3   No current facility-administered medications for this visit.  REVIEW OF SYSTEMS:   10 Point review of Systems was done is negative except as noted above.   PHYSICAL EXAMINATION: ECOG PERFORMANCE STATUS: 1 - Symptomatic but completely ambulatory There were no vitals filed for this visit..  Telehealth Visit.   LABORATORY DATA:  I have reviewed the data as listed   CBC Latest Ref Rng & Units 04/18/2020 01/11/2020 10/17/2019  WBC 3.4 - 10.8 x10E3/uL 6.0 5.9 5.9  Hemoglobin 13.0 - 17.7 g/dL 16.6 15.1 15.6  Hematocrit 37.5 - 51.0 % 49.6 44.2 45.7  Platelets 150 - 450 x10E3/uL 185 145(L) 189     . CMP Latest Ref Rng & Units 04/18/2020 01/11/2020 10/17/2019  Glucose 65 - 99 mg/dL 111(H) 102(H) 98  BUN 8 - 27 mg/dL 14 17 17   Creatinine 0.76 - 1.27 mg/dL 1.11 1.14 1.14  Sodium 134 - 144 mmol/L 137 138 133(L)  Potassium 3.5 - 5.2 mmol/L 4.4 4.1 4.7  Chloride 96 - 106 mmol/L 100 104 99  CO2 20 - 29 mmol/L 22 27 22   Calcium 8.6 - 10.2 mg/dL 9.2 9.2 9.2  Total Protein 6.0 - 8.5 g/dL 6.9 7.0 6.9  Total Bilirubin 0.0 - 1.2 mg/dL 0.6 0.8 0.8  Alkaline Phos 44 - 121 IU/L 138(H) 129(H) 150(H)  AST 0 - 40 IU/L 19 15 20   ALT 0 - 44 IU/L 21 13 14    PATHOLOGY:    RADIOGRAPHIC STUDIES: I have personally reviewed the radiological images as listed and agreed with the findings in the report.  No results found.  ASSESSMENT & PLAN:   70 y.o. 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 -creatinine WNL. Some concern for decreased hearing and baseline tinnitus due to previous job noise related injury. Baseline audiogram showed normal symmetric low-frequency hearing, dropping off substantially above 2000Hz .  s/p recent completion of concurrent chemo-radiation with weekly Cisplatin, 12/30/2016 - 02/18/2017  He stopped tube feeding 04/16/17. He is eating by mouth now.  04/30/16 PET which shows no enlarged or hypermetabolic activity in cervical lymph nodes and no residual primary focus of head and neck disease. Has new hypermetabolic nodule in upper lobe in left lung. This favors inflammatory process.  12/08/17 PET/CT revealed There is a new focus of abnormal asymmetric uptake within the right side of tongue within SUV max of 6.65. Suspicious for recurrence of disease versus new site of disease. 2. No significant FDG uptake associated with the irregular nodule within the left lung apex which may be postinflammatory in etiology  01/13/18 MRI Neck revealed No evident mass to explain PET findings, which may have been muscular. 2. Negative for nodal disease.  2. Pulmonary nodule  Initially seen on 04/30/16 PET/CT  06/17/17 CT Chest shows stable left lung nodule, with no new developments, overall stable. Will continue to monitor closely.    09/09/17 CT Chest revealed Stable to decrease in size irregular pulmonary nodule in the posterior left lung apex, hypermetabolic on PET-CT from 88/41/6606. Aortic Atherosclerosis  and Emphysema. Multi vessel coronary artery atherosclerotic calcifications.   12/15/17 CT Chest revealed Stable appearing left apical density, likely scar tissue given the surrounding changes. This was mildly hypermetabolic on the PET-CT from 04/29/2017 but not hypermetabolic on the more recent PET-CT from 12/08/2017. Recommend follow-up noncontrast chest CT in  6 months. 2. No enlarged mediastinal or hilar lymph nodes. 3. Stable advanced emphysematous changes and apical  pulmonary scarring. No new/acute pulmonary findings. 4. Stable advanced atherosclerotic calcifications involving the thoracic aorta and branch vessels.  03/23/18 CT Chest revealed "No change in the appearance of left apical nodular density which appears confluent with areas of apical pleuroparenchymal scarring and may be postinflammatory/infectious in etiology. 2. New tiny nodule within the right lower lobe measuring 2 mm is nonspecific. Attention on follow-up imaging is advised. 3. Aortic Atherosclerosis and Emphysema 4. Multi vessel coronary artery atherosclerotic calcifications".  07/14/18 CT Neck revealed "No mass or lymphadenopathy identified. NIRADS category 1, routine surveillance. 2. Low-density mucosal thickening of the oral and hypopharynx, fatty replacement of submandibular glands, and non masslike fat stranding within submandibular/parapharyngeal compartments are compatible with posttreatment changes. 3. Stable left lung apex pulmonary nodule."  12/28/2018 Chest CT ([id number]) revealed "1. Stable exam. No change in the appearance of left apical nodular density which appears confluent with areas of pleuroparenchymal scarring and may be postinflammatory/infectious in etiology. 2. 2 mm right lower lobe lung nodule identified on prior exam is stable to improved in the interval. 3. Coronary artery atherosclerotic calcifications."  PLAN: -Discussed pt recent labwork, ***    -The pt shows no clinical or lab progression/return of his tonsillar cancer at this time. -No indication for further treatment at this time. Will continue watchful observation. -Will continue to monitor for evolving hypothyroidism. -Recommend pt continue f/u with Dr. Constance Holster every 6 months -Will see back in ***   FOLLOW UP: ***   The total time spent in the appt was *** minutes and more than 50% was on counseling and direct patient cares.  All of the patient's questions were answered with apparent satisfaction. The  patient knows to call the clinic with any problems, questions or concerns.   Sullivan Lone MD [place] AAHIVMS [facility] Hematology/Oncology Physician [facility]  (Office):       [phone] (Work cell):  [phone] (Fax):           [phone]  07/19/2020 3:32 PM  I, Reinaldo Raddle, am acting as scribe for Dr. Sullivan Lone, MD.

## 2020-07-20 ENCOUNTER — Inpatient Hospital Stay: Payer: 59 | Attending: Hematology | Admitting: Hematology

## 2020-07-20 ENCOUNTER — Other Ambulatory Visit: Payer: 59

## 2020-07-20 ENCOUNTER — Telehealth: Payer: Self-pay | Admitting: Hematology

## 2020-07-20 NOTE — Telephone Encounter (Signed)
Scheduled appointment per 05/27 sch msg. Patient is aware.

## 2020-08-08 ENCOUNTER — Encounter: Payer: Self-pay | Admitting: Family Medicine

## 2020-08-08 DIAGNOSIS — H43392 Other vitreous opacities, left eye: Secondary | ICD-10-CM | POA: Diagnosis not present

## 2020-08-08 DIAGNOSIS — H35371 Puckering of macula, right eye: Secondary | ICD-10-CM | POA: Diagnosis not present

## 2020-08-08 DIAGNOSIS — H524 Presbyopia: Secondary | ICD-10-CM | POA: Diagnosis not present

## 2020-08-08 DIAGNOSIS — H35013 Changes in retinal vascular appearance, bilateral: Secondary | ICD-10-CM | POA: Diagnosis not present

## 2020-08-08 DIAGNOSIS — H35033 Hypertensive retinopathy, bilateral: Secondary | ICD-10-CM | POA: Diagnosis not present

## 2020-10-14 ENCOUNTER — Other Ambulatory Visit: Payer: Self-pay | Admitting: Family Medicine

## 2020-10-14 DIAGNOSIS — F411 Generalized anxiety disorder: Secondary | ICD-10-CM

## 2020-10-15 ENCOUNTER — Other Ambulatory Visit: Payer: Self-pay | Admitting: Family Medicine

## 2020-10-15 DIAGNOSIS — K219 Gastro-esophageal reflux disease without esophagitis: Secondary | ICD-10-CM

## 2020-10-16 ENCOUNTER — Encounter: Payer: Self-pay | Admitting: Family Medicine

## 2020-10-16 ENCOUNTER — Other Ambulatory Visit: Payer: Self-pay

## 2020-10-16 ENCOUNTER — Ambulatory Visit (INDEPENDENT_AMBULATORY_CARE_PROVIDER_SITE_OTHER): Payer: 59 | Admitting: Family Medicine

## 2020-10-16 VITALS — BP 131/68 | HR 74 | Temp 98.2°F | Ht 69.0 in | Wt 198.8 lb

## 2020-10-16 DIAGNOSIS — E785 Hyperlipidemia, unspecified: Secondary | ICD-10-CM | POA: Diagnosis not present

## 2020-10-16 DIAGNOSIS — K219 Gastro-esophageal reflux disease without esophagitis: Secondary | ICD-10-CM | POA: Diagnosis not present

## 2020-10-16 DIAGNOSIS — Z125 Encounter for screening for malignant neoplasm of prostate: Secondary | ICD-10-CM

## 2020-10-16 DIAGNOSIS — E559 Vitamin D deficiency, unspecified: Secondary | ICD-10-CM | POA: Diagnosis not present

## 2020-10-16 DIAGNOSIS — F411 Generalized anxiety disorder: Secondary | ICD-10-CM | POA: Diagnosis not present

## 2020-10-16 DIAGNOSIS — I10 Essential (primary) hypertension: Secondary | ICD-10-CM

## 2020-10-16 MED ORDER — OMEPRAZOLE 20 MG PO CPDR: DELAYED_RELEASE_CAPSULE | ORAL | 3 refills | Status: DC

## 2020-10-16 MED ORDER — EZETIMIBE 10 MG PO TABS
10.0000 mg | ORAL_TABLET | Freq: Every day | ORAL | 3 refills | Status: DC
Start: 2020-10-16 — End: 2021-10-17

## 2020-10-16 MED ORDER — ESCITALOPRAM OXALATE 20 MG PO TABS: 10.0000 mg | ORAL_TABLET | Freq: Every day | ORAL | 3 refills | Status: DC

## 2020-10-16 MED ORDER — ATORVASTATIN CALCIUM 40 MG PO TABS: 40.0000 mg | ORAL_TABLET | Freq: Every day | ORAL | 3 refills | Status: DC

## 2020-10-16 MED ORDER — AMLODIPINE BESYLATE 10 MG PO TABS: ORAL_TABLET | ORAL | 3 refills | Status: DC

## 2020-10-16 NOTE — Progress Notes (Signed)
Subjective:  Patient ID: Terry Pacheco, male    DOB: 04-14-1950  Age: 70 y.o. MRN: [id number]  CC: Medical Management of Chronic Issues   HPI Terry Pacheco presents for follow-up of hypertension. Patient has no history of headache chest pain or shortness of breath or recent cough. Patient also denies symptoms of TIA such as numbness weakness lateralizing. Patient checks  blood pressure at home and has not had any elevated readings recently. Patient denies side effects from his medication. States taking it regularly. Patient in for follow-up of GERD. Currently asymptomatic taking  PPI daily. There is no chest pain or heartburn. No hematemesis and no melena. No dysphagia or choking. Onset is remote. Progression is stable. Complicating factors, none. Patient in for follow-up of elevated cholesterol. Doing well without complaints on current medication. Denies side effects of statin including myalgia and arthralgia and nausea. Also in today for liver function testing. Currently no chest pain, shortness of breath or other cardiovascular related symptoms noted.  Depression screen [facility] 2/9 10/16/2020 04/18/2020 10/17/2019  Decreased Interest 0 0 0  Down, Depressed, Hopeless 0 0 0  PHQ - 2 Score 0 0 0  Some recent data might be hidden    History Terry Pacheco has a past medical history of Aortic stenosis, Arthritis, Cancer ([place]) (10/2016), GERD (gastroesophageal reflux disease) (07/06/2014), History of radiation therapy (12/30/2016- 02/18/2017), Hypertension, Sleep apnea, and Wears glasses.   He has a past surgical history that includes Hernia repair; vocal cord polyp ; Total hip arthroplasty (Right, 10/16/2015); polyp removal (2008); IR FLUORO GUIDE PORT INSERTION RIGHT (12/25/2016); IR US Guide Vasc Access Right (12/25/2016); IR GASTROSTOMY TUBE MOD SED (12/25/2016); IR CV Line Injection (01/14/2017); IR REMOVAL TUN ACCESS W/ PORT W/O FL MOD SED (03/06/2017); IR GASTROSTOMY TUBE REMOVAL/REPAIR (06/05/2017); and LEFT  HEART CATH AND CORONARY ANGIOGRAPHY (N/A, 12/07/2017).   His family history includes Heart disease in his mother; Stroke in his father.He reports that he quit smoking about 3 years ago. His smoking use included cigarettes. He started smoking about 37 years ago. He has a 22.50 pack-year smoking history. He has never used smokeless tobacco. He reports that he does not currently use alcohol. He reports that he does not currently use drugs after having used the following drugs: Marijuana.    ROS Review of Systems  Constitutional: Negative.   HENT: Negative.    Eyes:  Negative for visual disturbance.  Respiratory:  Negative for cough and shortness of breath.   Cardiovascular:  Negative for chest pain and leg swelling.  Gastrointestinal:  Negative for abdominal pain, diarrhea, nausea and vomiting.  Genitourinary:  Negative for difficulty urinating.  Musculoskeletal:  Negative for arthralgias and myalgias.  Skin:  Negative for rash.  Neurological:  Negative for headaches.  Psychiatric/Behavioral:  Negative for sleep disturbance.    Objective:  BP 131/68   Pulse 74   Temp 98.2 F (36.8 C)   Ht _0  (1.753 m)   Wt 198 lb 12.8 oz (90.2 kg)   SpO2 95%   BMI 29.36 kg/m   BP Readings from Last 3 Encounters:  10/16/20 131/68  04/18/20 132/83  01/11/20 139/76    Wt Readings from Last 3 Encounters:  10/16/20 198 lb 12.8 oz (90.2 kg)  04/18/20 205 lb 4 oz (93.1 kg)  01/11/20 209 lb 9.6 oz (95.1 kg)     Physical Exam Constitutional:      General: He is not in acute distress.    Appearance: He is well-developed.  HENT:     Head: Normocephalic and atraumatic.     Right Ear: External ear normal.     Left Ear: External ear normal.     Nose: Nose normal.  Eyes:     Conjunctiva/sclera: Conjunctivae normal.     Pupils: Pupils are equal, round, and reactive to light.  Cardiovascular:     Rate and Rhythm: Normal rate and regular rhythm.     Heart sounds: Normal heart sounds. No murmur  heard. Pulmonary:     Effort: Pulmonary effort is normal. No respiratory distress.     Breath sounds: Normal breath sounds. No wheezing or rales.  Abdominal:     Palpations: Abdomen is soft.     Tenderness: There is no abdominal tenderness.  Musculoskeletal:        General: Normal range of motion.     Cervical back: Normal range of motion and neck supple.  Skin:    General: Skin is warm and dry.  Neurological:     Mental Status: He is alert and oriented to person, place, and time.     Deep Tendon Reflexes: Reflexes are normal and symmetric.  Psychiatric:        Behavior: Behavior normal.        Thought Content: Thought content normal.        Judgment: Judgment normal.      Assessment & Plan:   Terry Pacheco was seen today for medical management of chronic issues.  Diagnoses and all orders for this visit:  Screening for prostate cancer -     CBC with Differential/Platelet -     CMP14+EGFR -     PSA, total and free  Primary hypertension -     amLODipine (NORVASC) 10 MG tablet; TAKE 1 TABLET EVERY DAY FOR FOR BLOOD PRESSURE -     CBC with Differential/Platelet -     CMP14+EGFR  Essential hypertension -     amLODipine (NORVASC) 10 MG tablet; TAKE 1 TABLET EVERY DAY FOR FOR BLOOD PRESSURE -     CBC with Differential/Platelet -     CMP14+EGFR  Hyperlipidemia with target LDL less than 100 -     atorvastatin (LIPITOR) 40 MG tablet; Take 1 tablet (40 mg total) by mouth daily. -     CBC with Differential/Platelet -     CMP14+EGFR -     Lipid panel  GAD (generalized anxiety disorder) -     escitalopram (LEXAPRO) 20 MG tablet; Take 0.5 tablets (10 mg total) by mouth daily. -     CBC with Differential/Platelet -     CMP14+EGFR  Gastroesophageal reflux disease, unspecified whether esophagitis present -     omeprazole (PRILOSEC) 20 MG capsule; TAKE 1 CAPSULE BY MOUTH EVERY DAY -     CBC with Differential/Platelet -     CMP14+EGFR  Vitamin D deficiency -     CBC with  Differential/Platelet -     CMP14+EGFR -     VITAMIN D 25 Hydroxy (Vit-D Deficiency, Fractures)  Other orders -     ezetimibe (ZETIA) 10 MG tablet; Take 1 tablet (10 mg total) by mouth daily. Please schedule appt for further refills 1st attempt      I have changed Jeneen Rinks R. Poynor's escitalopram. I am also having him maintain his fluticasone, aspirin EC, acetaminophen, calcium carbonate, tamsulosin, B Complex, amLODipine, atorvastatin, ezetimibe, and omeprazole.  Allergies as of 10/16/2020       Reactions   Crestor [rosuvastatin]    Muscle aches  Medication List        Accurate as of October 16, 2020  5:32 PM. If you have any questions, ask your nurse or doctor.          acetaminophen 500 MG tablet Commonly known as: TYLENOL Take 1,000 mg by mouth daily as needed for moderate pain or headache.   amLODipine 10 MG tablet Commonly known as: NORVASC TAKE 1 TABLET EVERY DAY FOR FOR BLOOD PRESSURE   aspirin EC 81 MG tablet Take 81 mg by mouth daily.   atorvastatin 40 MG tablet Commonly known as: LIPITOR Take 1 tablet (40 mg total) by mouth daily.   B Complex Caps TAKE 1 CAPSULE BY MOUTH EVERY DAY   calcium carbonate 500 MG chewable tablet Commonly known as: TUMS - dosed in mg elemental calcium Chew 2 tablets by mouth daily as needed for indigestion or heartburn.   escitalopram 20 MG tablet Commonly known as: LEXAPRO Take 0.5 tablets (10 mg total) by mouth daily.   ezetimibe 10 MG tablet Commonly known as: ZETIA Take 1 tablet (10 mg total) by mouth daily. Please schedule appt for further refills 1st attempt   fluticasone 50 MCG/ACT nasal spray Commonly known as: FLONASE SPRAY 2 SPRAYS INTO EACH NOSTRIL EVERY DAY   omeprazole 20 MG capsule Commonly known as: PRILOSEC TAKE 1 CAPSULE BY MOUTH EVERY DAY What changed:  how much to take how to take this when to take this additional instructions Changed by: Claretta Fraise, MD   tamsulosin 0.4 MG Caps  capsule Commonly known as: FLOMAX Take 2 capsules (0.8 mg total) by mouth at bedtime. For urine flow and prostate         Follow-up: Return in about 6 months (around 04/18/2021).  Claretta Fraise, M.D.

## 2020-10-20 LAB — CMP14+EGFR
ALT: 15 IU/L (ref 0–44)
AST: 19 IU/L (ref 0–40)
Albumin/Globulin Ratio: 2.1 (ref 1.2–2.2)
Albumin: 4.4 g/dL (ref 3.8–4.8)
Alkaline Phosphatase: 126 IU/L — ABNORMAL HIGH (ref 44–121)
BUN/Creatinine Ratio: 17 (ref 10–24)
BUN: 19 mg/dL (ref 8–27)
Bilirubin Total: 0.7 mg/dL (ref 0.0–1.2)
CO2: 22 mmol/L (ref 20–29)
Calcium: 9 mg/dL (ref 8.6–10.2)
Chloride: 99 mmol/L (ref 96–106)
Creatinine, Ser: 1.12 mg/dL (ref 0.76–1.27)
Globulin, Total: 2.1 g/dL (ref 1.5–4.5)
Glucose: 99 mg/dL (ref 65–99)
Potassium: 4.5 mmol/L (ref 3.5–5.2)
Sodium: 135 mmol/L (ref 134–144)
Total Protein: 6.5 g/dL (ref 6.0–8.5)
eGFR: 71 mL/min/{1.73_m2} (ref >59)

## 2020-10-20 LAB — CBC WITH DIFFERENTIAL/PLATELET
Basophils Absolute: 0 10*3/uL (ref 0.0–0.2)
Basos: 1 %
EOS (ABSOLUTE): 0.5 10*3/uL — ABNORMAL HIGH (ref 0.0–0.4)
Eos: 8 %
Hematocrit: 44.3 % (ref 37.5–51.0)
Hemoglobin: 15.2 g/dL (ref 13.0–17.7)
Immature Grans (Abs): 0 10*3/uL (ref 0.0–0.1)
Immature Granulocytes: 0 %
Lymphocytes Absolute: 1.3 10*3/uL (ref 0.7–3.1)
Lymphs: 23 %
MCH: 30.4 pg (ref 26.6–33.0)
MCHC: 34.3 g/dL (ref 31.5–35.7)
MCV: 89 fL (ref 79–97)
Monocytes Absolute: 0.5 10*3/uL (ref 0.1–0.9)
Monocytes: 10 %
Neutrophils Absolute: 3.2 10*3/uL (ref 1.4–7.0)
Neutrophils: 58 %
Platelets: 161 10*3/uL (ref 150–450)
RBC: 5 x10E6/uL (ref 4.14–5.80)
RDW: 13 % (ref 11.6–15.4)
WBC: 5.6 10*3/uL (ref 3.4–10.8)

## 2020-10-20 LAB — LIPID PANEL
Chol/HDL Ratio: 2.5 ratio (ref 0.0–5.0)
Cholesterol, Total: 104 mg/dL (ref 100–199)
HDL: 41 mg/dL (ref >39)
LDL Chol Calc (NIH): 46 mg/dL (ref 0–99)
Triglycerides: 83 mg/dL (ref 0–149)
VLDL Cholesterol Cal: 17 mg/dL (ref 5–40)

## 2020-10-20 LAB — VITAMIN D 25 HYDROXY (VIT D DEFICIENCY, FRACTURES): Vit D, 25-Hydroxy: 30.4 ng/mL (ref 30.0–100.0)

## 2020-10-20 LAB — PSA, TOTAL AND FREE
PSA, Free Pct: 20.8 %
PSA, Free: 0.27 ng/mL
Prostate Specific Ag, Serum: 1.3 ng/mL (ref 0.0–4.0)

## 2020-10-24 NOTE — Progress Notes (Signed)
Hello Chey,  Your lab result is normal and/or stable.Some minor variations that are not significant are commonly marked abnormal, but do not represent any medical problem for you.  Best regards, Arvell Pulsifer, M.D.

## 2021-01-10 ENCOUNTER — Inpatient Hospital Stay: Payer: 59 | Attending: Hematology

## 2021-01-10 ENCOUNTER — Inpatient Hospital Stay (HOSPITAL_BASED_OUTPATIENT_CLINIC_OR_DEPARTMENT_OTHER): Payer: 59 | Admitting: Hematology

## 2021-01-10 ENCOUNTER — Other Ambulatory Visit: Payer: Self-pay

## 2021-01-10 VITALS — BP 134/77 | HR 64 | Temp 97.9°F | Resp 18 | Wt 197.7 lb

## 2021-01-10 DIAGNOSIS — R918 Other nonspecific abnormal finding of lung field: Secondary | ICD-10-CM

## 2021-01-10 DIAGNOSIS — C090 Malignant neoplasm of tonsillar fossa: Secondary | ICD-10-CM

## 2021-01-10 DIAGNOSIS — Z79899 Other long term (current) drug therapy: Secondary | ICD-10-CM | POA: Diagnosis not present

## 2021-01-10 DIAGNOSIS — R911 Solitary pulmonary nodule: Secondary | ICD-10-CM | POA: Diagnosis not present

## 2021-01-10 DIAGNOSIS — C099 Malignant neoplasm of tonsil, unspecified: Secondary | ICD-10-CM | POA: Insufficient documentation

## 2021-01-10 LAB — CMP (CANCER CENTER ONLY)
ALT: 14 U/L (ref 0–44)
AST: 19 U/L (ref 15–41)
Albumin: 4.1 g/dL (ref 3.5–5.0)
Alkaline Phosphatase: 118 U/L (ref 38–126)
Anion gap: 9 (ref 5–15)
BUN: 17 mg/dL (ref 8–23)
CO2: 26 mmol/L (ref 22–32)
Calcium: 9 mg/dL (ref 8.9–10.3)
Chloride: 103 mmol/L (ref 98–111)
Creatinine: 1.1 mg/dL (ref 0.61–1.24)
GFR, Estimated: >60 mL/min (ref >60)
Glucose, Bld: 94 mg/dL (ref 70–99)
Potassium: 4.4 mmol/L (ref 3.5–5.1)
Sodium: 138 mmol/L (ref 135–145)
Total Bilirubin: 0.6 mg/dL (ref 0.3–1.2)
Total Protein: 6.9 g/dL (ref 6.5–8.1)

## 2021-01-10 LAB — CBC WITH DIFFERENTIAL/PLATELET
Abs Immature Granulocytes: 0.01 10*3/uL (ref 0.00–0.07)
Basophils Absolute: 0 10*3/uL (ref 0.0–0.1)
Basophils Relative: 1 %
Eosinophils Absolute: 0.4 10*3/uL (ref 0.0–0.5)
Eosinophils Relative: 7 %
HCT: 44.5 % (ref 39.0–52.0)
Hemoglobin: 14.9 g/dL (ref 13.0–17.0)
Immature Granulocytes: 0 %
Lymphocytes Relative: 18 %
Lymphs Abs: 1 10*3/uL (ref 0.7–4.0)
MCH: 30.4 pg (ref 26.0–34.0)
MCHC: 33.5 g/dL (ref 30.0–36.0)
MCV: 90.8 fL (ref 80.0–100.0)
Monocytes Absolute: 0.5 10*3/uL (ref 0.1–1.0)
Monocytes Relative: 9 %
Neutro Abs: 3.5 10*3/uL (ref 1.7–7.7)
Neutrophils Relative %: 65 %
Platelets: 140 10*3/uL — ABNORMAL LOW (ref 150–400)
RBC: 4.9 MIL/uL (ref 4.22–5.81)
RDW: 12.6 % (ref 11.5–15.5)
WBC: 5.4 10*3/uL (ref 4.0–10.5)
nRBC: 0 % (ref 0.0–0.2)

## 2021-01-10 LAB — TSH: TSH: 3.108 u[IU]/mL (ref 0.320–4.118)

## 2021-01-11 ENCOUNTER — Telehealth: Payer: Self-pay | Admitting: Hematology

## 2021-01-11 NOTE — Telephone Encounter (Signed)
Scheduled per 22/27 los, pt has been called and confirmed appt

## 2021-01-21 NOTE — Progress Notes (Addendum)
HEMATOLOGY/ONCOLOGY FOLLOW UP VISIT NOTE  Date of Service: .01/10/2021   Patient Care Team: Claretta Fraise, MD as PCP - General (Family Medicine) Jodi Marble, MD as Consulting Physician (Otolaryngology) Eppie Gibson, MD as Attending Physician (Radiation Oncology) Leota Sauers, RN (Inactive) as Oncology Nurse Navigator (Oncology) Karie Mainland, RD as Dietitian (Nutrition) Jomarie Longs, PT (Inactive) as Physical Therapist (Physical Therapy) Sharen Counter, CCC-SLP as Speech Language Pathologist (Speech Pathology) Kennith Center, LCSW as Social Worker Lavonna Monarch, MD as Consulting Physician (Dermatology)  CHIEF COMPLAINTS/PURPOSE OF CONSULTATION:  F/u for tonsillar carcinoma Pulmonary nodule  HISTORY OF PRESENTING ILLNESS:   Terry Pacheco is a wonderful 70 y.o. male who has been referred to [place] by ENT Specialist, Dr. Erik Obey for evaluation and management of invasive squamous cell carcinoma of tonsil.   He has a PMHx of GERD, HTN, High cholesterol which he takes medications for. He denies PMHx of seizures. He is accompanied by his wife and sister today. He reports that he is doing well overall. The pt initially presented with sudden-onset right lateral tongue numbness that has been ongoing for approximately 1 year. Subsequently, he had left sided gum swelling and intermittent sore throat symptoms. He notes no stroke-like symptoms. He notes that following these symptoms, he was evaluated by multiple providers including an ENT specialist and a dentist prior to meeting with Dr. Erik Obey. Pt wife noted swelling and discoloration inside his mouth prior to the patient being evaluated by Dr. Erik Obey. At his first visit with Dr. Erik Obey, he noted an abnormality to his left tonsil and completed a biopsy that same day.   He has had CT soft tissue neck with contrast on 11/18/2016 with results showing: Fullness of the right palatine tonsil. Surrounding fat planes are ill-defined.  This area is obscured by dental artifact. Possible right tonsillar carcinoma.   The patient has had biopsy completed on 11/19/2016 with results of: "RIGHT FAUCIAL TONSIL", BIOPSY: Squamous cell carcinoma, suspicious for superficial invasion in this material. Immunostain p16 positive.   He also had a PET scan completed on 12/02/2016 with results revealing: IMPRESSION: 1. Mildly asymmetric tonsillar activity along the right tonsillar sinus, maximum SUV 7.7 as compared to the left side 5.2, suspicious in this setting for right-sided malignancy. There is also a mildly enlarged and hypermetabolic right level IIa lymph node with maximum SUV of 8.4. No evidence of other metastatic spread. 2. Faint accentuation of activity in the left lateral prostate gland apex, but low-grade. Correlate with PSA level in determining whether further workup is warranted. 3. Other imaging findings of potential clinical significance: Aortic Atherosclerosis (ICD10-I70.0) and Emphysema (ICD10-J43.9). Coronary atherosclerosis. Small type 1 hiatal hernia. Bilateral renal cysts. Colonic diverticulosis. Lastly, he had a MR Face trigeminal on 12/09/2016 with results of IMPRESSION: 1. No evidence of perineural spread of malignancy. 2. Mild asymmetry of the palatine tonsils without discrete mass. Correlate with direct visualization.   As far as surgeries, he has had a polyp on left sided throat that was removed 3-4 years ago that resulted negative for malignancy. He also has had a right hip replacement. Lastly, he has had a left inguinal hernia operation in 1978. He started smoking cigarettes at age 52 and would smoke 1 PPD while working. When he retired several years ago he smoked 0.5 PPD and has quit smoking cigarettes 1 month ago since his diagnosis. He states that he used a vape after quitting but was advised to quit via Dr. Isidore Moos. He occasionally  consumes ETOH. He notes that he previously worked in a Education administrator at [employer]. Denies allergies to medications at this time. He reports that his father died of a stroke at age 94 and his mother died from cardiac issues. Mother had gastric cancer and was diagnosed in late 45's. Denies any other cancers of blood disorders in the family.    On review of systems, he denies hematuria, dysuria, or urinary retention. He reports urinary frequency over the past year. He reports bowel incontinence following a bowel movement x 1 year that has gradually improved more recently. He notes that his bowel incontinence has been intermittent. He has well formed stools without blood in stools or rectal bleeding. He denies increase in bowel movements and he has up to 1 bowel movement daily. He has had 2-3 occurrences of hemorrhoids with no recent flares. He denies mucous in his stools. He denies injuries or surgeries to his rectal area. He denies bladder incontinence. He has lower back pain that has been chronic and unchanged. He has a prior hx of diverticulitis approximately 10 years ago that was followed with colonoscopy and was then diagnosed with diverticulosis. He denies headache at this time. He is able to ambulate for prolonged periods without dyspnea on exertion. 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 He has had an audiogram completed by an ENT specialist in [place], [place]:  Surveillance   INTERIM HISTORY:    Terry Pacheco is called today for management and evaluation of his tonsillar cancer and pulmonary nodule. The patient's last visit with [place] was on 01/11/2020. The pt reports that he is doing well overall. Patient notes he has had some issues with dry mouth which makes swallowing some of the food items are little more difficult. No new shortness of breath or chest pain or new cough. No change in voice. Notes that he needs to reconnect with ENT. Labs today 01/10/2021 show CBC within normal limits other than  platelets of 140k CMP within normal limits TSH 3.108    MEDICAL HISTORY:  Past Medical History:  Diagnosis Date   Aortic stenosis    Mild to moderate by echo 11/2016   Arthritis    Cancer ([place]) 10/2016   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    Hypertension    Sleep apnea    does not use CPAP   Wears glasses     SURGICAL HISTORY: Past Surgical History:  Procedure Laterality Date   HERNIA REPAIR     right inguinal hernia   IR CV LINE INJECTION  01/14/2017   IR FLUORO GUIDE PORT INSERTION RIGHT  12/25/2016   IR GASTROSTOMY TUBE MOD SED  12/25/2016   IR GASTROSTOMY TUBE REMOVAL  06/05/2017   IR REMOVAL TUN ACCESS W/ PORT W/O FL MOD SED  03/06/2017   IR US GUIDE VASC ACCESS RIGHT  12/25/2016   LEFT HEART CATH AND CORONARY ANGIOGRAPHY N/A 12/07/2017   Procedure: LEFT HEART CATH AND CORONARY ANGIOGRAPHY;  Surgeon: Belva Crome, MD;  Location: [facility];  Service: Cardiovascular;  Laterality: N/A;   polyp removal  2008   during colonoscopy   TOTAL HIP ARTHROPLASTY Right 10/16/2015   Procedure: RIGHT TOTAL HIP ARTHROPLASTY ANTERIOR APPROACH;  Surgeon: Paralee Cancel, MD;  Location: [facility];  Service: Orthopedics;  Laterality: Right;   vocal cord polyp      removed 2-3 years ago    SOCIAL HISTORY: Social History   Socioeconomic History   Marital status: Married    Spouse name: Not on file   Number of children: 1   Years of education: Not on file   Highest education level: Not on file  Occupational History   Occupation: Retired     Fish farm manager: [employer]  Tobacco Use   Smoking status: Former    Packs/day: 0.50    Years:  45.00    Pack years: 22.50    Types: Cigarettes    Start date: 01/12/1983    Quit date: 11/17/2016    Years since quitting: 4.1   Smokeless tobacco: Never  Vaping Use   Vaping Use: Never used  Substance and Sexual Activity   Alcohol use: Not Currently    Alcohol/week: 0.0 standard drinks   Drug use: Not Currently    Types: Marijuana    Comment: last use 10/09/2015   Sexual activity: Not on file  Other Topics Concern   Not on file  Social History Narrative   Not on file   Social Determinants of Health   Financial Resource Strain: Not on file  Food Insecurity: Not on file  Transportation Needs: Not on file  Physical Activity: Not on file  Stress: Not on file  Social Connections: Not on file  Intimate Partner Violence: Not on file    FAMILY HISTORY: Family History  Problem Relation Age of Onset   Heart disease Mother    Stroke Father    Colon cancer Neg Hx    Esophageal cancer Neg Hx    Pancreatic cancer Neg Hx    Stomach cancer Neg Hx    Liver cancer Neg Hx     ALLERGIES:  is allergic to crestor [rosuvastatin].  MEDICATIONS:  Current Outpatient Medications  Medication Sig Dispense Refill   acetaminophen (TYLENOL) 500 MG tablet Take 1,000 mg by mouth daily as needed for moderate pain or headache.     amLODipine (NORVASC) 10 MG tablet TAKE 1 TABLET EVERY DAY FOR FOR BLOOD PRESSURE 90 tablet 3   aspirin EC 81 MG tablet Take 81 mg by mouth daily.     atorvastatin (LIPITOR) 40 MG tablet Take 1 tablet (40 mg total) by mouth daily. 90 tablet 3   B Complex CAPS TAKE 1 CAPSULE BY MOUTH EVERY DAY 100 capsule 2   calcium carbonate (TUMS - DOSED IN MG ELEMENTAL CALCIUM) 500 MG chewable tablet Chew 2 tablets by mouth daily as needed for indigestion or heartburn.     escitalopram (LEXAPRO) 20 MG tablet Take 0.5 tablets (10 mg total) by mouth daily. 45 tablet 3   ezetimibe (ZETIA) 10 MG tablet Take 1 tablet (10 mg total) by mouth daily. Please schedule appt for further refills  1st attempt 90 tablet 3   fluticasone (FLONASE) 50 MCG/ACT nasal spray SPRAY 2 SPRAYS INTO EACH NOSTRIL EVERY DAY 48 g 0   omeprazole (PRILOSEC) 20 MG capsule TAKE 1 CAPSULE BY MOUTH EVERY DAY 90 capsule 3   tamsulosin (FLOMAX) 0.4 MG CAPS capsule Take 2 capsules (0.8 mg total) by mouth at bedtime. For urine flow and prostate 180 capsule 3  No current facility-administered medications for this visit.    REVIEW OF SYSTEMS:   .10 Point review of Systems was done is negative except as noted above.  PHYSICAL EXAMINATION: ECOG PERFORMANCE STATUS: 1 - Symptomatic but completely ambulatory Vitals:   01/10/21 1410  BP: 134/77  Pulse: 64  Resp: 18  Temp: 97.9 F (36.6 C)  SpO2: 97%  Weight: 197 lb 11.2 oz (89.7 kg)  . GENERAL:alert, in no acute distress and comfortable SKIN: no acute rashes, no significant lesions EYES: conjunctiva are pink and non-injected, sclera anicteric OROPHARYNX: MMM, no exudates, no oropharyngeal erythema or ulceration NECK: supple, no JVD LYMPH:  no palpable lymphadenopathy in the cervical, axillary or inguinal regions LUNGS: clear to auscultation b/l with normal respiratory effort HEART: regular rate & rhythm ABDOMEN:  normoactive bowel sounds , non tender, not distended. Extremity: no pedal edema PSYCH: alert & oriented x 3 with fluent speech NEURO: no focal motor/sensory deficits   LABORATORY DATA:  I have reviewed the data as listed   CBC Latest Ref Rng & Units 01/10/2021 10/16/2020 04/18/2020  WBC 4.0 - 10.5 K/uL 5.4 5.6 6.0  Hemoglobin 13.0 - 17.0 g/dL 14.9 15.2 16.6  Hematocrit 39.0 - 52.0 % 44.5 44.3 49.6  Platelets 150 - 400 K/uL 140(L) 161 185     . CMP Latest Ref Rng & Units 01/10/2021 10/16/2020 04/18/2020  Glucose 70 - 99 mg/dL 94 99 111(H)  BUN 8 - 23 mg/dL 17 19 14   Creatinine 0.61 - 1.24 mg/dL 1.10 1.12 1.11  Sodium 135 - 145 mmol/L 138 135 137  Potassium 3.5 - 5.1 mmol/L 4.4 4.5 4.4  Chloride 98 - 111 mmol/L 103 99 100  CO2 22 -  32 mmol/L 26 22 22   Calcium 8.9 - 10.3 mg/dL 9.0 9.0 9.2  Total Protein 6.5 - 8.1 g/dL 6.9 6.5 6.9  Total Bilirubin 0.3 - 1.2 mg/dL 0.6 0.7 0.6  Alkaline Phos 38 - 126 U/L 118 126(H) 138(H)  AST 15 - 41 U/L 19 19 19   ALT 0 - 44 U/L 14 15 21    PATHOLOGY:    RADIOGRAPHIC STUDIES: I have personally reviewed the radiological images as listed and agreed with the findings in the report.  No results found.   ASSESSMENT & PLAN:   70 y.o. 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 -creatinine WNL. Some concern for decreased hearing and baseline tinnitus due to previous job noise related injury. Baseline audiogram showed normal symmetric low-frequency hearing, dropping off substantially above 2000Hz .  s/p recent completion of concurrent chemo-radiation with weekly Cisplatin, 12/30/2016 - 02/18/2017  He stopped tube feeding 04/16/17. He is eating by mouth now.  04/30/16 PET which shows no enlarged or hypermetabolic activity in cervical lymph nodes and no residual primary focus of head and neck disease. Has new hypermetabolic nodule in upper lobe in left lung. This favors inflammatory process.  12/08/17 PET/CT revealed There is a new focus of abnormal asymmetric uptake within the right side of tongue within SUV max of 6.65. Suspicious for recurrence of disease versus new site of disease. 2. No significant FDG uptake associated with the irregular nodule within the left lung apex which may be postinflammatory in etiology  01/13/18 MRI Neck revealed No evident mass to explain PET  findings, which may have been muscular. 2. Negative for nodal disease.  2. Pulmonary nodule  Initially seen on 04/30/16 PET/CT  06/17/17 CT Chest shows stable left lung nodule, with no new  developments, overall stable. Will continue to monitor closely.    09/09/17 CT Chest revealed Stable to decrease in size irregular pulmonary nodule in the posterior left lung apex, hypermetabolic on PET-CT from 61/60/7371. Aortic Atherosclerosis  and Emphysema. Multi vessel coronary artery atherosclerotic calcifications.   12/15/17 CT Chest revealed Stable appearing left apical density, likely scar tissue given the surrounding changes. This was mildly hypermetabolic on the PET-CT from 04/29/2017 but not hypermetabolic on the more recent PET-CT from 12/08/2017. Recommend follow-up noncontrast chest CT in 6 months. 2. No enlarged mediastinal or hilar lymph nodes. 3. Stable advanced emphysematous changes and apical pulmonary scarring. No new/acute pulmonary findings. 4. Stable advanced atherosclerotic calcifications involving the thoracic aorta and branch vessels.  03/23/18 CT Chest revealed "No change in the appearance of left apical nodular density which appears confluent with areas of apical pleuroparenchymal scarring and may be postinflammatory/infectious in etiology. 2. New tiny nodule within the right lower lobe measuring 2 mm is nonspecific. Attention on follow-up imaging is advised. 3. Aortic Atherosclerosis and Emphysema 4. Multi vessel coronary artery atherosclerotic calcifications".  07/14/18 CT Neck revealed "No mass or lymphadenopathy identified. NIRADS category 1, routine surveillance. 2. Low-density mucosal thickening of the oral and hypopharynx, fatty replacement of submandibular glands, and non masslike fat stranding within submandibular/parapharyngeal compartments are compatible with posttreatment changes. 3. Stable left lung apex pulmonary nodule."  12/28/2018 Chest CT ([id number]) revealed "1. Stable exam. No change in the appearance of left apical nodular density which appears confluent with areas of pleuroparenchymal scarring and may be postinflammatory/infectious in etiology. 2. 2 mm  right lower lobe lung nodule identified on prior exam is stable to improved in the interval. 3. Coronary artery atherosclerotic calcifications."  Plan: -Discussed labs done today 01/10/2021 -stable, CBC with platelets of 140k, CMP within normal limits and TSH within normal limits at 3.1 -Patient has no lab or clinical symptoms of recurrence of tonsillar carcinoma. -Would recommend follow-up with Dr. Constance Holster every 6 months. CT chest in 23 weeks Phone visit with Dr Irene Limbo in 24 weeks  FOLLOW UP: CT chest in 23 weeks Phone visit with Dr Irene Limbo in 24 weeks   . The total time spent in the appointment was 20 minutes and more than 50% was on counseling and direct patient cares.   All of the patient's questions were answered with apparent satisfaction. The patient knows to call the clinic with any problems, questions or concerns.   Sullivan Lone MD [place] AAHIVMS [facility] Hematology/Oncology Physician [facility]

## 2021-01-21 NOTE — Addendum Note (Signed)
Addended by: Sullivan Lone on: 01/21/2021 02:11 AM   Modules accepted: Orders

## 2021-04-18 ENCOUNTER — Encounter: Payer: Self-pay | Admitting: Family Medicine

## 2021-04-18 ENCOUNTER — Ambulatory Visit (INDEPENDENT_AMBULATORY_CARE_PROVIDER_SITE_OTHER): Payer: 59 | Admitting: Family Medicine

## 2021-04-18 VITALS — BP 119/68 | HR 78 | Temp 98.1°F | Ht 69.0 in | Wt 187.8 lb

## 2021-04-18 DIAGNOSIS — N401 Enlarged prostate with lower urinary tract symptoms: Secondary | ICD-10-CM | POA: Diagnosis not present

## 2021-04-18 DIAGNOSIS — I10 Essential (primary) hypertension: Secondary | ICD-10-CM | POA: Diagnosis not present

## 2021-04-18 DIAGNOSIS — F411 Generalized anxiety disorder: Secondary | ICD-10-CM | POA: Diagnosis not present

## 2021-04-18 DIAGNOSIS — R350 Frequency of micturition: Secondary | ICD-10-CM

## 2021-04-18 DIAGNOSIS — E785 Hyperlipidemia, unspecified: Secondary | ICD-10-CM

## 2021-04-18 MED ORDER — TAMSULOSIN HCL 0.4 MG PO CAPS: 0.8000 mg | ORAL_CAPSULE | Freq: Every day | ORAL | 3 refills | Status: DC

## 2021-04-18 MED ORDER — ESCITALOPRAM OXALATE 20 MG PO TABS: 20.0000 mg | ORAL_TABLET | Freq: Every day | ORAL | 3 refills | Status: DC

## 2021-04-18 NOTE — Progress Notes (Signed)
Subjective:  Patient ID: Terry Pacheco, male    DOB: 1951-02-23  Age: 71 y.o. MRN: [id number]  CC: Medical Management of Chronic Issues   HPI [name] presents for  follow-up of hypertension. Patient has no history of headache chest pain or shortness of breath or recent cough. Patient also denies symptoms of TIA such as focal numbness or weakness. Patient denies side effects from medication. States taking it regularly.  Some urinary hesitancy and nocturia X 1. This is better than before taking the tamsulosin Depression screen [facility] 2/9 04/18/2021 10/16/2020 04/18/2020 10/17/2019 04/18/2019  Decreased Interest 0 0 0 0 0  Down, Depressed, Hopeless 0 0 0 0 0  PHQ - 2 Score 0 0 0 0 0  Some recent data might be hidden     History Caeson has a past medical history of Aortic stenosis, Arthritis, Cancer ([place]) (10/2016), GERD (gastroesophageal reflux disease) (07/06/2014), History of radiation therapy (12/30/2016- 02/18/2017), Hypertension, Sleep apnea, and Wears glasses.   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   His family history includes Heart disease in his mother; Stroke in his father.He reports that he quit smoking about 4 years ago. His smoking use included cigarettes. He started smoking about 38 years ago. He has a 22.50 pack-year smoking history. He has never used smokeless tobacco. He reports that he does not currently use alcohol. He reports that he does not currently use drugs after having used the following drugs: Marijuana.  Current Outpatient  Medications on File Prior to Visit  Medication Sig Dispense Refill   acetaminophen (TYLENOL) 500 MG tablet Take 1,000 mg by mouth daily as needed for moderate pain or headache.     amLODipine (NORVASC) 10 MG tablet TAKE 1 TABLET EVERY DAY FOR FOR BLOOD PRESSURE 90 tablet 3   aspirin EC 81 MG tablet Take 81 mg by mouth daily.     atorvastatin (LIPITOR) 40 MG tablet Take 1 tablet (40 mg total) by mouth daily. 90 tablet 3   B Complex CAPS TAKE 1 CAPSULE BY MOUTH EVERY DAY 100 capsule 2   calcium carbonate (TUMS - DOSED IN MG ELEMENTAL CALCIUM) 500 MG chewable tablet Chew 2 tablets by mouth daily as needed for indigestion or heartburn.     ezetimibe (ZETIA) 10 MG tablet Take 1 tablet (10 mg total) by mouth daily. Please schedule appt for further refills 1st attempt 90 tablet 3   fluticasone (FLONASE) 50 MCG/ACT nasal spray SPRAY 2 SPRAYS INTO EACH NOSTRIL EVERY DAY 48 g 0   omeprazole (PRILOSEC) 20 MG capsule TAKE 1 CAPSULE BY MOUTH EVERY DAY 90 capsule 3   No current facility-administered medications on file prior to visit.    ROS Review of Systems  Constitutional:  Negative for fever.  Respiratory:  Negative for shortness of breath.   Cardiovascular:  Negative for chest pain.  Musculoskeletal:  Negative for arthralgias.  Skin:  Negative for rash.   Objective:  BP 119/68    Pulse 78    Temp 98.1 F (36.7 C)    Ht _0  (1.753 m)    Wt 187 lb  12.8 oz (85.2 kg)    BMI 27.73 kg/m   BP Readings from Last 3 Encounters:  04/18/21 119/68  01/10/21 134/77  10/16/20 131/68    Wt Readings from Last 3 Encounters:  04/18/21 187 lb 12.8 oz (85.2 kg)  01/10/21 197 lb 11.2 oz (89.7 kg)  10/16/20 198 lb 12.8 oz (90.2 kg)     Physical Exam Constitutional:      General: He is not in acute distress.    Appearance: He is well-developed.  HENT:     Head: Normocephalic and atraumatic.     Right Ear: External ear normal.     Left Ear: External ear normal.     Nose: Nose normal.  Eyes:      Conjunctiva/sclera: Conjunctivae normal.     Pupils: Pupils are equal, round, and reactive to light.  Cardiovascular:     Rate and Rhythm: Normal rate and regular rhythm.     Heart sounds: Normal heart sounds. No murmur heard. Pulmonary:     Effort: Pulmonary effort is normal. No respiratory distress.     Breath sounds: Normal breath sounds. No wheezing or rales.  Abdominal:     Palpations: Abdomen is soft.     Tenderness: There is no abdominal tenderness.  Musculoskeletal:        General: Normal range of motion.     Cervical back: Normal range of motion and neck supple.  Skin:    General: Skin is warm and dry.  Neurological:     Mental Status: He is alert and oriented to person, place, and time.     Deep Tendon Reflexes: Reflexes are normal and symmetric.  Psychiatric:        Behavior: Behavior normal.        Thought Content: Thought content normal.        Judgment: Judgment normal.      Assessment & Plan:   Jamez was seen today for medical management of chronic issues.  Diagnoses and all orders for this visit:  Primary hypertension -     CBC with Differential/Platelet -     CMP14+EGFR  Hyperlipidemia with target LDL less than 100 -     Lipid panel  Benign prostatic hyperplasia with urinary frequency -     tamsulosin (FLOMAX) 0.4 MG CAPS capsule; Take 2 capsules (0.8 mg total) by mouth at bedtime. For urine flow and prostate  GAD (generalized anxiety disorder) -     escitalopram (LEXAPRO) 20 MG tablet; Take 1 tablet (20 mg total) by mouth daily.   Allergies as of 04/18/2021       Reactions   Crestor [rosuvastatin]    Muscle aches        Medication List        Accurate as of April 18, 2021 10:57 AM. If you have any questions, ask your nurse or doctor.          acetaminophen 500 MG tablet Commonly known as: TYLENOL Take 1,000 mg by mouth daily as needed for moderate pain or headache.   amLODipine 10 MG tablet Commonly known as: NORVASC TAKE 1  TABLET EVERY DAY FOR FOR BLOOD PRESSURE   aspirin EC 81 MG tablet Take 81 mg by mouth daily.   atorvastatin 40 MG tablet Commonly known as: LIPITOR Take 1 tablet (40 mg total) by mouth daily.   B Complex Caps TAKE 1 CAPSULE BY MOUTH EVERY DAY   calcium carbonate 500 MG chewable tablet Commonly known as: TUMS - dosed in  mg elemental calcium Chew 2 tablets by mouth daily as needed for indigestion or heartburn.   escitalopram 20 MG tablet Commonly known as: LEXAPRO Take 1 tablet (20 mg total) by mouth daily. What changed: how much to take Changed by: Claretta Fraise, MD   ezetimibe 10 MG tablet Commonly known as: ZETIA Take 1 tablet (10 mg total) by mouth daily. Please schedule appt for further refills 1st attempt   fluticasone 50 MCG/ACT nasal spray Commonly known as: FLONASE SPRAY 2 SPRAYS INTO EACH NOSTRIL EVERY DAY   omeprazole 20 MG capsule Commonly known as: PRILOSEC TAKE 1 CAPSULE BY MOUTH EVERY DAY   tamsulosin 0.4 MG Caps capsule Commonly known as: FLOMAX Take 2 capsules (0.8 mg total) by mouth at bedtime. For urine flow and prostate        Meds ordered this encounter  Medications   tamsulosin (FLOMAX) 0.4 MG CAPS capsule    Sig: Take 2 capsules (0.8 mg total) by mouth at bedtime. For urine flow and prostate    Dispense:  180 capsule    Refill:  3   escitalopram (LEXAPRO) 20 MG tablet    Sig: Take 1 tablet (20 mg total) by mouth daily.    Dispense:  90 tablet    Refill:  3      Follow-up: Return in about 6 months (around 10/16/2021).  Claretta Fraise, M.D.

## 2021-04-19 LAB — CMP14+EGFR
ALT: 15 IU/L (ref 0–44)
AST: 16 IU/L (ref 0–40)
Albumin/Globulin Ratio: 2 (ref 1.2–2.2)
Albumin: 4.5 g/dL (ref 3.8–4.8)
Alkaline Phosphatase: 128 IU/L — ABNORMAL HIGH (ref 44–121)
BUN/Creatinine Ratio: 17 (ref 10–24)
BUN: 17 mg/dL (ref 8–27)
Bilirubin Total: 0.5 mg/dL (ref 0.0–1.2)
CO2: 23 mmol/L (ref 20–29)
Calcium: 9.1 mg/dL (ref 8.6–10.2)
Chloride: 104 mmol/L (ref 96–106)
Creatinine, Ser: 1.01 mg/dL (ref 0.76–1.27)
Globulin, Total: 2.3 g/dL (ref 1.5–4.5)
Glucose: 101 mg/dL — ABNORMAL HIGH (ref 70–99)
Potassium: 4.5 mmol/L (ref 3.5–5.2)
Sodium: 137 mmol/L (ref 134–144)
Total Protein: 6.8 g/dL (ref 6.0–8.5)
eGFR: 80 mL/min/{1.73_m2} (ref >59)

## 2021-04-19 LAB — CBC WITH DIFFERENTIAL/PLATELET
Basophils Absolute: 0 10*3/uL (ref 0.0–0.2)
Basos: 1 %
EOS (ABSOLUTE): 0.4 10*3/uL (ref 0.0–0.4)
Eos: 7 %
Hematocrit: 45 % (ref 37.5–51.0)
Hemoglobin: 15.2 g/dL (ref 13.0–17.7)
Immature Grans (Abs): 0 10*3/uL (ref 0.0–0.1)
Immature Granulocytes: 0 %
Lymphocytes Absolute: 1.4 10*3/uL (ref 0.7–3.1)
Lymphs: 21 %
MCH: 30.6 pg (ref 26.6–33.0)
MCHC: 33.8 g/dL (ref 31.5–35.7)
MCV: 91 fL (ref 79–97)
Monocytes Absolute: 0.5 10*3/uL (ref 0.1–0.9)
Monocytes: 8 %
Neutrophils Absolute: 4.2 10*3/uL (ref 1.4–7.0)
Neutrophils: 63 %
Platelets: 190 10*3/uL (ref 150–450)
RBC: 4.97 x10E6/uL (ref 4.14–5.80)
RDW: 13.3 % (ref 11.6–15.4)
WBC: 6.6 10*3/uL (ref 3.4–10.8)

## 2021-04-19 LAB — LIPID PANEL
Chol/HDL Ratio: 2.4 ratio (ref 0.0–5.0)
Cholesterol, Total: 107 mg/dL (ref 100–199)
HDL: 44 mg/dL (ref >39)
LDL Chol Calc (NIH): 49 mg/dL (ref 0–99)
Triglycerides: 65 mg/dL (ref 0–149)
VLDL Cholesterol Cal: 14 mg/dL (ref 5–40)

## 2021-04-22 NOTE — Progress Notes (Signed)
Hello Terry Pacheco,  Your lab result is normal and/or stable.Some minor variations that are not significant are commonly marked abnormal, but do not represent any medical problem for you.  Best regards, Torrey Horseman, M.D.

## 2021-06-13 ENCOUNTER — Ambulatory Visit (HOSPITAL_COMMUNITY)
Admission: RE | Admit: 2021-06-13 | Discharge: 2021-06-13 | Disposition: A | Discharge status: Home / Self Care | Payer: 59 | Source: Ambulatory Visit | Attending: Hematology | Admitting: Hematology

## 2021-06-13 DIAGNOSIS — R911 Solitary pulmonary nodule: Secondary | ICD-10-CM | POA: Diagnosis present

## 2021-06-13 DIAGNOSIS — C090 Malignant neoplasm of tonsillar fossa: Secondary | ICD-10-CM | POA: Diagnosis present

## 2021-06-18 DIAGNOSIS — G8929 Other chronic pain: Secondary | ICD-10-CM | POA: Insufficient documentation

## 2021-07-05 ENCOUNTER — Inpatient Hospital Stay: Payer: 59 | Attending: Hematology | Admitting: Hematology

## 2021-07-05 DIAGNOSIS — H9319 Tinnitus, unspecified ear: Secondary | ICD-10-CM | POA: Diagnosis not present

## 2021-07-05 DIAGNOSIS — C090 Malignant neoplasm of tonsillar fossa: Secondary | ICD-10-CM | POA: Diagnosis not present

## 2021-07-05 DIAGNOSIS — R911 Solitary pulmonary nodule: Secondary | ICD-10-CM | POA: Diagnosis not present

## 2021-07-05 DIAGNOSIS — C099 Malignant neoplasm of tonsil, unspecified: Secondary | ICD-10-CM | POA: Insufficient documentation

## 2021-07-12 NOTE — Progress Notes (Signed)
HEMATOLOGY/ONCOLOGY FOLLOW UP VISIT NOTE  Date of Service: .07/05/2021   Patient Care Team: Claretta Fraise, MD as PCP - General (Family Medicine) Jodi Marble, MD as Consulting Physician (Otolaryngology) Eppie Gibson, MD as Attending Physician (Radiation Oncology) Leota Sauers, RN (Inactive) as Oncology Nurse Navigator (Oncology) Karie Mainland, RD as Dietitian (Nutrition) Jomarie Longs, PT (Inactive) as Physical Therapist (Physical Therapy) Sharen Counter, CCC-SLP as Speech Language Pathologist (Speech Pathology) Kennith Center, LCSW as Social Worker Lavonna Monarch, MD as Consulting Physician (Dermatology)  CHIEF COMPLAINTS/PURPOSE OF CONSULTATION:  review of CT chest  HISTORY OF PRESENTING ILLNESS:   Terry Pacheco is a wonderful 71 y.o. male who has been referred to [place] by ENT Specialist, Dr. Erik Obey for evaluation and management of invasive squamous cell carcinoma of tonsil.   He has a PMHx of GERD, HTN, High cholesterol which he takes medications for. He denies PMHx of seizures. He is accompanied by his wife and sister today. He reports that he is doing well overall. The pt initially presented with sudden-onset right lateral tongue numbness that has been ongoing for approximately 1 year. Subsequently, he had left sided gum swelling and intermittent sore throat symptoms. He notes no stroke-like symptoms. He notes that following these symptoms, he was evaluated by multiple providers including an ENT specialist and a dentist prior to meeting with Dr. Erik Obey. Pt wife noted swelling and discoloration inside his mouth prior to the patient being evaluated by Dr. Erik Obey. At his first visit with Dr. Erik Obey, he noted an abnormality to his left tonsil and completed a biopsy that same day.   He has had CT soft tissue neck with contrast on 11/18/2016 with results showing: Fullness of the right palatine tonsil. Surrounding fat planes are ill-defined. This area is obscured by  dental artifact. Possible right tonsillar carcinoma.   The patient has had biopsy completed on 11/19/2016 with results of: "RIGHT FAUCIAL TONSIL", BIOPSY: Squamous cell carcinoma, suspicious for superficial invasion in this material. Immunostain p16 positive.   He also had a PET scan completed on 12/02/2016 with results revealing: IMPRESSION: 1. Mildly asymmetric tonsillar activity along the right tonsillar sinus, maximum SUV 7.7 as compared to the left side 5.2, suspicious in this setting for right-sided malignancy. There is also a mildly enlarged and hypermetabolic right level IIa lymph node with maximum SUV of 8.4. No evidence of other metastatic spread. 2. Faint accentuation of activity in the left lateral prostate gland apex, but low-grade. Correlate with PSA level in determining whether further workup is warranted. 3. Other imaging findings of potential clinical significance: Aortic Atherosclerosis (ICD10-I70.0) and Emphysema (ICD10-J43.9). Coronary atherosclerosis. Small type 1 hiatal hernia. Bilateral renal cysts. Colonic diverticulosis. Lastly, he had a MR Face trigeminal on 12/09/2016 with results of IMPRESSION: 1. No evidence of perineural spread of malignancy. 2. Mild asymmetry of the palatine tonsils without discrete mass. Correlate with direct visualization.   As far as surgeries, he has had a polyp on left sided throat that was removed 3-4 years ago that resulted negative for malignancy. He also has had a right hip replacement. Lastly, he has had a left inguinal hernia operation in 1978. He started smoking cigarettes at age 38 and would smoke 1 PPD while working. When he retired several years ago he smoked 0.5 PPD and has quit smoking cigarettes 1 month ago since his diagnosis. He states that he used a vape after quitting but was advised to quit via Dr. Isidore Moos. He occasionally consumes ETOH.  He notes that he previously worked in a Education administrator at [employer]. Denies allergies to  medications at this time. He reports that his father died of a stroke at age 73 and his mother died from cardiac issues. Mother had gastric cancer and was diagnosed in late 75's. Denies any other cancers of blood disorders in the family.    On review of systems, he denies hematuria, dysuria, or urinary retention. He reports urinary frequency over the past year. He reports bowel incontinence following a bowel movement x 1 year that has gradually improved more recently. He notes that his bowel incontinence has been intermittent. He has well formed stools without blood in stools or rectal bleeding. He denies increase in bowel movements and he has up to 1 bowel movement daily. He has had 2-3 occurrences of hemorrhoids with no recent flares. He denies mucous in his stools. He denies injuries or surgeries to his rectal area. He denies bladder incontinence. He has lower back pain that has been chronic and unchanged. He has a prior hx of diverticulitis approximately 10 years ago that was followed with colonoscopy and was then diagnosed with diverticulosis. He denies headache at this time. He is able to ambulate for prolonged periods without dyspnea on exertion. 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 He has had an audiogram completed by an ENT specialist in [place], [place]:  Surveillance   INTERIM HISTORY:    Terry Pacheco is here for continued evaluation and management of his head and neck cancer. He notes he has been feeling well and has no acute new symptoms. No change in voice.  No new' \\neck'$  swelling. Labs done today were discussed in detail with him CT chest was also reviewed with him in detail.  MEDICAL HISTORY:  Past Medical History:  Diagnosis Date   Aortic stenosis    Mild to moderate by echo 11/2016   Arthritis    Cancer ([place]) 10/2016   cancer of the tonsil/ 35 radiation treatments/4 doses of chemo/last radiatio  02/18/2017   GERD (gastroesophageal reflux disease) 07/06/2014   History of radiation therapy 12/30/2016- 02/18/2017   Right Tonsil and bilateral neck/ 70 Gy in 35 fractions to gross diseasae, 63 gy in 35 fractions to high risk nodal echelons, and 56 Gy in 35 fraction to intermediate risk nodal echelons.    Hypertension    Sleep apnea    does not use CPAP   Wears glasses     SURGICAL HISTORY: Past Surgical History:  Procedure Laterality Date   HERNIA REPAIR     right inguinal hernia   IR CV LINE INJECTION  01/14/2017   IR FLUORO GUIDE PORT INSERTION RIGHT  12/25/2016   IR GASTROSTOMY TUBE MOD SED  12/25/2016   IR GASTROSTOMY TUBE REMOVAL  06/05/2017   IR REMOVAL TUN ACCESS W/ PORT W/O FL MOD SED  03/06/2017   IR US GUIDE VASC ACCESS RIGHT  12/25/2016   LEFT HEART CATH AND CORONARY ANGIOGRAPHY N/A 12/07/2017   Procedure: LEFT HEART CATH AND CORONARY ANGIOGRAPHY;  Surgeon: Belva Crome, MD;  Location: [facility];  Service: Cardiovascular;  Laterality: N/A;   polyp removal  2008   during colonoscopy   TOTAL HIP ARTHROPLASTY Right 10/16/2015   Procedure: RIGHT TOTAL HIP ARTHROPLASTY ANTERIOR APPROACH;  Surgeon: Paralee Cancel, MD;  Location: [facility];  Service: Orthopedics;  Laterality: Right;   vocal  cord polyp      removed 2-3 years ago    SOCIAL HISTORY: Social History   Socioeconomic History   Marital status: Married    Spouse name: Not on file   Number of children: 1   Years of education: Not on file   Highest education level: Not on file  Occupational History   Occupation: Retired     Fish farm manager: [employer]  Tobacco Use   Smoking status: Former    Packs/day: 0.50    Years: 45.00    Pack years: 22.50    Types: Cigarettes    Start date: 01/12/1983    Quit date: 11/17/2016    Years since quitting: 4.6   Smokeless tobacco: Never  Vaping Use   Vaping Use: Never used  Substance and Sexual Activity   Alcohol use: Not Currently    Alcohol/week: 0.0 standard drinks    Drug use: Not Currently    Types: Marijuana    Comment: last use 10/09/2015   Sexual activity: Not on file  Other Topics Concern   Not on file  Social History Narrative   Not on file   Social Determinants of Health   Financial Resource Strain: Not on file  Food Insecurity: Not on file  Transportation Needs: Not on file  Physical Activity: Not on file  Stress: Not on file  Social Connections: Not on file  Intimate Partner Violence: Not on file    FAMILY HISTORY: Family History  Problem Relation Age of Onset   Heart disease Mother    Stroke Father    Colon cancer Neg Hx    Esophageal cancer Neg Hx    Pancreatic cancer Neg Hx    Stomach cancer Neg Hx    Liver cancer Neg Hx     ALLERGIES:  is allergic to crestor [rosuvastatin].  MEDICATIONS:  Current Outpatient Medications  Medication Sig Dispense Refill   acetaminophen (TYLENOL) 500 MG tablet Take 1,000 mg by mouth daily as needed for moderate pain or headache.     amLODipine (NORVASC) 10 MG tablet TAKE 1 TABLET EVERY DAY FOR FOR BLOOD PRESSURE 90 tablet 3   aspirin EC 81 MG tablet Take 81 mg by mouth daily.     atorvastatin (LIPITOR) 40 MG tablet Take 1 tablet (40 mg total) by mouth daily. 90 tablet 3   B Complex CAPS TAKE 1 CAPSULE BY MOUTH EVERY DAY 100 capsule 2   calcium carbonate (TUMS - DOSED IN MG ELEMENTAL CALCIUM) 500 MG chewable tablet Chew 2 tablets by mouth daily as needed for indigestion or heartburn.     escitalopram (LEXAPRO) 20 MG tablet Take 1 tablet (20 mg total) by mouth daily. 90 tablet 3   ezetimibe (ZETIA) 10 MG tablet Take 1 tablet (10 mg total) by mouth daily. Please schedule appt for further refills 1st attempt 90 tablet 3   fluticasone (FLONASE) 50 MCG/ACT nasal spray SPRAY 2 SPRAYS INTO EACH NOSTRIL EVERY DAY 48 g 0   omeprazole (PRILOSEC) 20 MG capsule TAKE 1 CAPSULE BY MOUTH EVERY DAY 90 capsule 3   tamsulosin (FLOMAX) 0.4 MG CAPS capsule Take 2 capsules (0.8 mg total) by mouth at bedtime.  For urine flow and prostate 180 capsule 3   No current facility-administered medications for this visit.    REVIEW OF SYSTEMS:   10 Point review of Systems was done is negative except as noted above.  PHYSICAL EXAMINATION: ECOG PERFORMANCE STATUS: 1 - Symptomatic but completely ambulatory There were no vitals  filed for this visit. Marland Kitchen NAD GENERAL:alert, in no acute distress and comfortable SKIN: no acute rashes, no significant lesions EYES: conjunctiva are pink and non-injected, sclera anicteric OROPHARYNX: MMM, no exudates, no oropharyngeal erythema or ulceration NECK: supple, no JVD LYMPH:  no palpable lymphadenopathy in the cervical, axillary or inguinal regions LUNGS: clear to auscultation b/l with normal respiratory effort HEART: regular rate & rhythm ABDOMEN:  normoactive bowel sounds , non tender, not distended. Extremity: no pedal edema PSYCH: alert & oriented x 3 with fluent speech NEURO: no focal motor/sensory deficits  LABORATORY DATA:  I have reviewed the data as listed      Latest Ref Rng & Units 04/18/2021   10:46 AM 01/10/2021    1:28 PM 10/16/2020   10:58 AM  CBC  WBC 3.4 - 10.8 x10E3/uL 6.6   5.4   5.6    Hemoglobin 13.0 - 17.7 g/dL 15.2   14.9   15.2    Hematocrit 37.5 - 51.0 % 45.0   44.5   44.3    Platelets 150 - 450 x10E3/uL 190   140   161       .    Latest Ref Rng & Units 04/18/2021   10:46 AM 01/10/2021    1:28 PM 10/16/2020   10:58 AM  CMP  Glucose 70 - 99 mg/dL 101   94   99    BUN 8 - 27 mg/dL '17   17   19    '$ Creatinine 0.76 - 1.27 mg/dL 1.01   1.10   1.12    Sodium 134 - 144 mmol/L 137   138   135    Potassium 3.5 - 5.2 mmol/L 4.5   4.4   4.5    Chloride 96 - 106 mmol/L 104   103   99    CO2 20 - 29 mmol/L '23   26   22    '$ Calcium 8.6 - 10.2 mg/dL 9.1   9.0   9.0    Total Protein 6.0 - 8.5 g/dL 6.8   6.9   6.5    Total Bilirubin 0.0 - 1.2 mg/dL 0.5   0.6   0.7    Alkaline Phos 44 - 121 IU/L 128   118   126    AST 0 - 40 IU/L '16   19    19    '$ ALT 0 - 44 IU/L '15   14   15     '$ PATHOLOGY:    RADIOGRAPHIC STUDIES: I have personally reviewed the radiological images as listed and agreed with the findings in the report.  CT Chest Wo Contrast  Result Date: 06/14/2021 CLINICAL DATA:  History of tonsillar cancer follow-up lung nodules. * Tracking Code: BO * EXAM: CT CHEST WITHOUT CONTRAST TECHNIQUE: Multidetector CT imaging of the chest was performed following the standard protocol without IV contrast. RADIATION DOSE REDUCTION: This exam was performed according to the departmental dose-optimization program which includes automated exposure control, adjustment of the mA and/or kV according to patient size and/or use of iterative reconstruction technique. COMPARISON:  January 27, 2020 chest CT FINDINGS: Cardiovascular: Aortic atherosclerosis without aneurysmal dilation. Dense calcifications of the aortic valve. Coronary artery calcifications. Normal size heart. No significant pericardial effusion/thickening. Mediastinum/Nodes: No discrete thyroid nodule. No pathologically enlarged mediastinal, hilar or axillary lymph nodes, noting limited sensitivity for the detection of hilar adenopathy on this noncontrast study. Lungs/Pleura: Similar biapical pleuroparenchymal scarring or radiation fibrosis. Unchanged size of the more nodular area  in the left lung apex measuring 13 x 8 mm on image 16/5. No new suspicious pulmonary nodules or masses. Mild dependent bibasilar scarring. Centrilobular emphysematous change. No pleural effusion. No pneumothorax. Upper Abdomen: No acute abnormality. Musculoskeletal: Multilevel degenerative changes spine. No aggressive lytic or blastic lesion of bone. Similar chronic irregular bowing of the sternum. No acute osseous abnormality. IMPRESSION: 1. Similar biapical radiation fibrosis and/or pleuroparenchymal scarring with unchanged more nodular area in the left lung apex measuring 13 x 8 mm. With stability over a significant  period of time indicating a benign etiology with again. 2. No new suspicious pulmonary nodules or masses. 3. Aortic Atherosclerosis (ICD10-I70.0) and Emphysema (ICD10-J43.9). Electronically Signed   By: Dahlia Bailiff M.D.   On: 06/14/2021 10:52     ASSESSMENT & PLAN:   71 y.o. 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 -creatinine WNL. Some concern for decreased hearing and baseline tinnitus due to previous job noise related injury. Baseline audiogram showed normal symmetric low-frequency hearing, dropping off substantially above '2000Hz'$ .  s/p recent completion of concurrent chemo-radiation with weekly Cisplatin, 12/30/2016 - 02/18/2017  He stopped tube feeding 04/16/17. He is eating by mouth now.  04/30/16 PET which shows no enlarged or hypermetabolic activity in cervical lymph nodes and no residual primary focus of head and neck disease. Has new hypermetabolic nodule in upper lobe in left lung. This favors inflammatory process.  12/08/17 PET/CT revealed There is a new focus of abnormal asymmetric uptake within the right side of tongue within SUV max of 6.65. Suspicious for recurrence of disease versus new site of disease. 2. No significant FDG uptake associated with the irregular nodule within the left lung apex which may be postinflammatory in etiology  01/13/18 MRI Neck revealed No evident mass to explain PET findings, which may have been muscular. 2. Negative for nodal disease.  2. Pulmonary nodule  Initially seen on 04/30/16 PET/CT  06/17/17 CT Chest shows stable left lung nodule, with no new developments, overall stable. Will continue to monitor closely.    09/09/17 CT Chest revealed Stable to decrease in size irregular pulmonary nodule in the posterior left  lung apex, hypermetabolic on PET-CT from 16/96/7893. Aortic Atherosclerosis  and Emphysema. Multi vessel coronary artery atherosclerotic calcifications.   12/15/17 CT Chest revealed Stable appearing left apical density, likely scar tissue given the surrounding changes. This was mildly hypermetabolic on the PET-CT from 04/29/2017 but not hypermetabolic on the more recent PET-CT from 12/08/2017. Recommend follow-up noncontrast chest CT in 6 months. 2. No enlarged mediastinal or hilar lymph nodes. 3. Stable advanced emphysematous changes and apical pulmonary scarring. No new/acute pulmonary findings. 4. Stable advanced atherosclerotic calcifications involving the thoracic aorta and branch vessels.  03/23/18 CT Chest revealed "No change in the appearance of left apical nodular density which appears confluent with areas of apical pleuroparenchymal scarring and may be postinflammatory/infectious in etiology. 2. New tiny nodule within the right lower lobe measuring 2 mm is nonspecific. Attention on follow-up imaging is advised. 3. Aortic Atherosclerosis and Emphysema 4. Multi vessel coronary artery atherosclerotic calcifications".  07/14/18 CT Neck revealed "No mass or lymphadenopathy identified. NIRADS category 1, routine surveillance. 2. Low-density mucosal thickening of the oral and hypopharynx, fatty replacement of submandibular glands, and non masslike fat stranding within  submandibular/parapharyngeal compartments are compatible with posttreatment changes. 3. Stable left lung apex pulmonary nodule."  12/28/2018 Chest CT ([id number]) revealed "1. Stable exam. No change in the appearance of left apical nodular density which appears confluent with areas of pleuroparenchymal scarring and may be postinflammatory/infectious in etiology. 2. 2 mm right lower lobe lung nodule identified on prior exam is stable to improved in the interval. 3. Coronary artery atherosclerotic calcifications."  Plan: -D discussed lab  results with the patient in detail from today as noted above. No clinical evidence of recurrent tonsillar cancer. -Patient will continue to follow with Dr. Constance Holster from ENT at least every 6 months for continued evaluation and surveillance. -CT chest from 06/13/2021 showed bilateral apical radiation fibrosis with stable left upper lung nodule which is unchanged.  No new lung nodules. Patient has no lab or clinical evidence of recurrence of his head and neck cancer Check TSH with primary care physician every 6 to 12 months  FOLLOW UP: Continue follow-up with Dr. Constance Holster for head and neck cancer surveillance Return to clinic with Dr. Irene Limbo as needed The total time spent in the appointment was 15 minutes*.  All of the patient's questions were answered with apparent satisfaction. The patient knows to call the clinic with any problems, questions or concerns.   Sullivan Lone MD MS AAHIVMS [facility] Hematology/Oncology Physician [facility]  .*Total Encounter Time as defined by the Centers for Medicare and Medicaid Services includes, in addition to the face-to-face time of a patient visit (documented in the note above) non-face-to-face time: obtaining and reviewing outside history, ordering and reviewing medications, tests or procedures, care coordination (communications with other health care professionals or caregivers) and documentation in the medical record.

## 2021-07-31 ENCOUNTER — Ambulatory Visit (INDEPENDENT_AMBULATORY_CARE_PROVIDER_SITE_OTHER): Payer: 59

## 2021-07-31 VITALS — Ht 69.0 in | Wt 187.0 lb

## 2021-07-31 DIAGNOSIS — Z0000 Encounter for general adult medical examination without abnormal findings: Secondary | ICD-10-CM | POA: Diagnosis not present

## 2021-07-31 NOTE — Progress Notes (Signed)
Subjective:   Terry Pacheco is a 71 y.o. male who presents for Medicare Annual/Subsequent preventive examination. Virtual Visit via Telephone Note  I connected with  Terry Pacheco on 07/31/21 at 12:00 PM EDT by telephone and verified that I am speaking with the correct person using two identifiers.  Location: Patient: HOME Provider: [facility] Persons participating in the virtual visit: patient/Nurse Health Advisor   I discussed the limitations, risks, security and privacy concerns of performing an evaluation and management service by telephone and the availability of in person appointments. The patient expressed understanding and agreed to proceed.  Interactive audio and video telecommunications were attempted between this nurse and patient, however failed, due to patient having technical difficulties OR patient did not have access to video capability.  We continued and completed visit with audio only.  Some vital signs may be absent or patient reported.   Chriss Driver, LPN  Review of Systems     Cardiac Risk Factors include: advanced age (>56mn, >>1women);hypertension;dyslipidemia;male gender;sedentary lifestyle     Objective:    Today's Vitals   07/31/21 1156  Weight: 187 lb (84.8 kg)  Height: '5\' 9"'$  (1.753 m)   Body mass index is 27.62 kg/m.     07/31/2021   11:59 AM 07/11/2019   12:32 PM 12/07/2017    7:45 AM 09/30/2017    2:42 PM 09/18/2017   10:15 AM 06/30/2017    1:27 PM 05/29/2017    2:20 PM  Advanced Directives  Does Patient Have a Medical Advance Directive? No No No No No No No  Would patient like information on creating a medical advance directive? No - Patient declined No - Patient declined No - Patient declined No - Patient declined No - Patient declined No - Patient declined No - Patient declined    Current Medications (verified) Outpatient Encounter Medications as of 07/31/2021  Medication Sig   acetaminophen (TYLENOL) 500 MG tablet Take 1,000 mg by mouth  daily as needed for moderate pain or headache.   amLODipine (NORVASC) 10 MG tablet TAKE 1 TABLET EVERY DAY FOR FOR BLOOD PRESSURE   aspirin EC 81 MG tablet Take 81 mg by mouth daily.   atorvastatin (LIPITOR) 40 MG tablet Take 1 tablet (40 mg total) by mouth daily.   B Complex CAPS TAKE 1 CAPSULE BY MOUTH EVERY DAY   calcium carbonate (TUMS - DOSED IN MG ELEMENTAL CALCIUM) 500 MG chewable tablet Chew 2 tablets by mouth daily as needed for indigestion or heartburn.   escitalopram (LEXAPRO) 20 MG tablet Take 1 tablet (20 mg total) by mouth daily.   ezetimibe (ZETIA) 10 MG tablet Take 1 tablet (10 mg total) by mouth daily. Please schedule appt for further refills 1st attempt   fluticasone (FLONASE) 50 MCG/ACT nasal spray SPRAY 2 SPRAYS INTO EACH NOSTRIL EVERY DAY   omeprazole (PRILOSEC) 20 MG capsule TAKE 1 CAPSULE BY MOUTH EVERY DAY   tamsulosin (FLOMAX) 0.4 MG CAPS capsule Take 2 capsules (0.8 mg total) by mouth at bedtime. For urine flow and prostate   No facility-administered encounter medications on file as of 07/31/2021.    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    Hypertension    Sleep apnea    does not use CPAP   Wears glasses    Past Surgical History:  Procedure Laterality Date   HERNIA REPAIR     right inguinal hernia   IR CV LINE INJECTION  01/14/2017   IR FLUORO GUIDE PORT INSERTION RIGHT  12/25/2016   IR GASTROSTOMY TUBE MOD SED  12/25/2016   IR  GASTROSTOMY TUBE REMOVAL  06/05/2017   IR REMOVAL TUN ACCESS W/ PORT W/O FL MOD SED  03/06/2017   IR US GUIDE VASC ACCESS RIGHT  12/25/2016   LEFT HEART CATH AND CORONARY ANGIOGRAPHY N/A 12/07/2017   Procedure: LEFT HEART CATH AND CORONARY ANGIOGRAPHY;  Surgeon: Belva Crome, MD;  Location: [facility];  Service: Cardiovascular;  Laterality: N/A;   polyp removal  2008   during colonoscopy   TOTAL HIP ARTHROPLASTY Right 10/16/2015   Procedure: RIGHT TOTAL HIP ARTHROPLASTY ANTERIOR APPROACH;  Surgeon: Paralee Cancel, MD;  Location: [facility];  Service: Orthopedics;  Laterality: Right;   vocal cord polyp      removed 2-3 years ago    Family History  Problem Relation Age of Onset   Heart disease Mother    Stroke Father    Colon cancer Neg Hx    Esophageal cancer Neg Hx    Pancreatic cancer Neg Hx    Stomach cancer Neg Hx    Liver cancer Neg Hx    Social History   Socioeconomic History   Marital status: Married    Spouse name: Not on file   Number of children: 1   Years of education: Not on file   Highest education level: Not on file  Occupational History   Occupation: Retired     Fish farm manager: [employer]  Tobacco Use   Smoking status: Former    Packs/day: 0.50    Years: 45.00    Pack years: 22.50    Types: Cigarettes    Start date: 01/12/1983    Quit date: 11/17/2016    Years since quitting: 4.7   Smokeless tobacco: Never  Vaping Use   Vaping Use: Never used  Substance and Sexual Activity   Alcohol use: Not Currently    Alcohol/week: 0.0 standard drinks   Drug use: Not Currently    Types: Marijuana    Comment: last use 10/09/2015   Sexual activity: Not on file  Other Topics Concern   Not on file  Social History Narrative   Not on file   Social Determinants of Health   Financial Resource Strain: Low Risk    Difficulty of Paying Living Expenses: Not hard at all  Food Insecurity: No Food Insecurity   Worried About Charity fundraiser in the Last Year: Never  true   [place] in the Last Year: Never true  Transportation Needs: No Transportation Needs   Lack of Transportation (Medical): No   Lack of Transportation (Non-Medical): No  Physical Activity: Insufficiently Active   Days of Exercise per Week: 3 days   Minutes of Exercise per Session: 30 min  Stress: No Stress Concern Present   Feeling of Stress : Not at all  Social Connections: Socially Integrated   Frequency of Communication with Friends and Family:  More than three times a week   Frequency of Social Gatherings with Friends and Family: More than three times a week   Attends Religious Services: More than 4 times per year   Active Member of Clubs or Organizations: Yes   Attends Music therapist: More than 4 times per year   Marital Status: Married    Tobacco Counseling Counseling given: Not Answered   Clinical Intake:  Pre-visit preparation completed: Yes  Pain : No/denies pain     BMI - recorded: 27.62 Nutritional Status: BMI 25 -29 Overweight Nutritional Risks: None Diabetes: No     Diabetic?NO  Interpreter Needed?: No  Information entered by :: mj Anitha Kreiser, lpn   Activities of Daily Living    07/31/2021   12:01 PM  In your present state of health, do you have any difficulty performing the following activities:  Hearing? 0  Vision? 0  Difficulty concentrating or making decisions? 0  Walking or climbing stairs? 0  Dressing or bathing? 0  Doing errands, shopping? 0  Preparing Food and eating ? N  Using the Toilet? N  In the past six months, have you accidently leaked urine? N  Do you have problems with loss of bowel control? N  Managing your Medications? N  Managing your Finances? N  Housekeeping or managing your Housekeeping? N    Patient Care Team: Claretta Fraise, MD as PCP - General (Family Medicine) Jodi Marble, MD as Consulting Physician (Otolaryngology) Eppie Gibson, MD as Attending Physician (Radiation Oncology) Leota Sauers, RN (Inactive) as Oncology Nurse Navigator (Oncology) Karie Mainland, RD as Dietitian (Nutrition) Jomarie Longs, PT (Inactive) as Physical Therapist (Physical Therapy) Sharen Counter, CCC-SLP as Speech Language Pathologist (Speech Pathology) Kennith Center, LCSW as Social Worker Lavonna Monarch, MD as Consulting Physician (Dermatology)  Indicate any recent Medical Services you may have received from other than [facility] providers in the past year (date may be approximate).     Assessment:   This is a routine wellness examination for Terry Pacheco.  Hearing/Vision screen Hearing Screening - Comments:: No hearing issues.  Vision Screening - Comments:: [facility]. [facility]. 07/2020.  Dietary issues and exercise activities discussed: Current Exercise Habits: Home exercise routine, Type of exercise: walking, Time (Minutes): 20, Frequency (Times/Week): 3, Weekly Exercise (Minutes/Week): 60, Intensity: Mild, Exercise limited by: cardiac condition(s)   Goals Addressed   None    Depression Screen    07/31/2021   11:58 AM 04/18/2021   10:20 AM 10/16/2020   10:00 AM 04/18/2020    9:54 AM 10/17/2019   10:09 AM 04/18/2019    8:55 AM 04/15/2018    9:06 AM  PHQ 2/9 Scores  PHQ - 2 Score 0 0 0 0 0 0 0    Fall Risk    07/31/2021   12:00 PM 04/18/2021   10:20 AM 10/16/2020   10:00 AM 04/18/2020    9:53 AM 10/17/2019   10:09 AM  Fall Risk   Falls in the past year? 0 0 0 0 0  Number falls in past yr: 0      Injury with Fall? 0      Risk for fall due to : No Fall Risks      Follow up Falls prevention discussed   Falls evaluation completed Falls evaluation completed    [place]:  Any stairs in or around the home? No  If so, are there any without handrails? No  Home  free of loose throw rugs in walkways, pet beds, electrical cords, etc? Yes  Adequate lighting in your home to reduce risk of falls? Yes   ASSISTIVE DEVICES UTILIZED TO PREVENT  FALLS:  Life alert? No  Use of a cane, walker or w/c? No  Grab bars in the bathroom? No  Shower chair or bench in shower? Yes  Elevated toilet seat or a handicapped toilet? No   TIMED UP AND GO:  Was the test performed? No .  Phone visit.  Cognitive Function:        07/31/2021   12:01 PM  6CIT Screen  What Year? 0 points  What month? 0 points  What time? 0 points  Count back from 20 0 points  Months in reverse 0 points  Repeat phrase 0 points  Total Score 0 points    Immunizations Immunization History  Administered Date(s) Administered   Influenza, High Dose Seasonal PF 12/16/2016, 01/03/2018   Influenza,inj,Quad PF,6+ Mos 12/19/2015   Influenza-Unspecified 02/14/2019   PFIZER(Purple Top)SARS-COV-2 Vaccination 03/15/2019, 04/04/2019, 12/20/2019   Pneumococcal Conjugate-13 12/16/2016   Pneumococcal Polysaccharide-23 01/03/2018   Tdap 10/13/2017   Zoster Recombinat (Shingrix) 01/03/2018, 04/19/2020, 07/25/2020    TDAP status: Up to date  Flu Vaccine status: Due, Education has been provided regarding the importance of this vaccine. Advised may receive this vaccine at local pharmacy or Health Dept. Aware to provide a copy of the vaccination record if obtained from local pharmacy or Health Dept. Verbalized acceptance and understanding.  Pneumococcal vaccine status: Up to date  Covid-19 vaccine status: Completed vaccines  Qualifies for Shingles Vaccine? Yes   Zostavax completed Yes   Shingrix Completed?: Yes  Screening Tests Health Maintenance  Topic Date Due   COVID-19 Vaccine (4 - Booster for Pfizer series) 08/16/2021 (Originally 02/14/2020)   Silt FOBT  02/20/2023 (Originally 12/16/2019)   INFLUENZA VACCINE  09/24/2021   COLONOSCOPY (Pts 45-16yr Insurance coverage will need to be confirmed)  12/16/2023   TETANUS/TDAP  10/14/2027   Pneumonia Vaccine 71 Years old  Completed   Hepatitis C Screening  Completed   Zoster Vaccines-  Shingrix  Completed   HPV VACCINES  Aged Out    Health Maintenance  There are no preventive care reminders to display for this patient.   Colorectal cancer screening: Type of screening: Colonoscopy. Completed 12/16/2018. Repeat every 5 years  Lung Cancer Screening: (Low Dose CT Chest recommended if Age 71-80years, 30 pack-year currently smoking OR have quit w/in 15years.) does qualify.   Lung Cancer Screening Referral: Done 06/13/2021.  Additional Screening:  Hepatitis C Screening: does qualify; Completed 04/05/15  Vision Screening: Recommended annual ophthalmology exams for early detection of glaucoma and other disorders of the eye. Is the patient up to date with their annual eye exam?  Yes  Who is the provider or what is the name of the [facility] which the patient [facility] not [facility] a provider, would they like to be referred to a provider to establish care? No .   Dental Screening: Recommended annual dental exams for proper oral hygiene  Community Resource Referral / Chronic Care Management: CRR required this visit?  No   CCM required this visit?  No      Plan:     I have personally reviewed and noted the following in the patient's chart:   Medical and social history Use of alcohol, tobacco or illicit drugs  Current medications and  supplements including opioid prescriptions. Patient is not currently taking opioid prescriptions. Functional ability and status Nutritional status Physical activity Advanced directives List of other physicians Hospitalizations, surgeries, and ER visits in previous 12 months Vitals Screenings to include cognitive, depression, and falls Referrals and appointments  In addition, I have reviewed and discussed with patient certain preventive protocols, quality metrics, and best practice recommendations. A written personalized care plan for preventive services as well as general preventive  health recommendations were provided to patient.     Chriss Driver, LPN   03/27/9756   Nurse Notes: Pt is up to date on health maintenance and vaccines.

## 2021-07-31 NOTE — Patient Instructions (Signed)
[name] ,/ Thank you for taking time to come for your Medicare Wellness Visit. I appreciate your ongoing commitment to your health goals. Please review the following plan we discussed and let me know if I can assist you in the future.   Screening recommendations/referrals: Colonoscopy: Done 12/16/2018 Repeat in 5 years  Recommended yearly ophthalmology/optometry visit for glaucoma screening and checkup Recommended yearly dental visit for hygiene and checkup  Vaccinations: Influenza vaccine: Due Fall 2023. Pneumococcal vaccine: Done 12/16/2016 and 01/03/2018. Tdap vaccine: Done 10/13/2017 Repeat in 10 years  Shingles vaccine: Done 07/25/2020, 04/19/2020 and 03/05/2017.   Covid-19: Done 12/18/2019, 04/16/2019 and 03/15/2019.  Advanced directives: Please bring a copy of your health care power of attorney and living will to the office to be added to your chart at your convenience.   Conditions/risks identified: Aim for 30 minutes of exercise or brisk walking, 6-8 glasses of water, and 5 servings of fruits and vegetables each day.   Next appointment: Follow up in one year for your annual wellness visit. 2024.  Preventive Care 71 Years and Older, Male  Preventive care refers to lifestyle choices and visits with your health care provider that can promote health and wellness. What does preventive care include? A yearly physical exam. This is also called an annual well check. Dental exams once or twice a year. Routine eye exams. Ask your health care provider how often you should have your eyes checked. Personal lifestyle choices, including: Daily care of your teeth and gums. Regular physical activity. Eating a healthy diet. Avoiding tobacco and drug use. Limiting alcohol use. Practicing safe sex. Taking low doses of aspirin every day. Taking vitamin and mineral supplements as recommended by your health care provider. What happens during an annual well check? The services and screenings  done by your health care provider during your annual well check will depend on your age, overall health, lifestyle risk factors, and family history of disease. Counseling  Your health care provider may ask you questions about your: Alcohol use. Tobacco use. Drug use. Emotional well-being. Home and relationship well-being. Sexual activity. Eating habits. History of falls. Memory and ability to understand (cognition). Work and work Statistician. Screening  You may have the following tests or measurements: Height, weight, and BMI. Blood pressure. Lipid and cholesterol levels. These may be checked every 5 years, or more frequently if you are over 33 years old. Skin check. Lung cancer screening. You may have this screening every year starting at age 71 if you have a 30-pack-year history of smoking and currently smoke or have quit within the past 15 years. Fecal occult blood test (FOBT) of the stool. You may have this test every year starting at age 71. Flexible sigmoidoscopy or colonoscopy. You may have a sigmoidoscopy every 5 years or a colonoscopy every 10 years starting at age 71. Prostate cancer screening. Recommendations will vary depending on your family history and other risks. Hepatitis C blood test. Hepatitis B blood test. Sexually transmitted disease (STD) testing. Diabetes screening. This is done by checking your blood sugar (glucose) after you have not eaten for a while (fasting). You may have this done every 1-3 years. Abdominal aortic aneurysm (AAA) screening. You may need this if you are a current or former smoker. Osteoporosis. You may be screened starting at age 71 if you are at high risk. Talk with your health care provider about your test results, treatment options, and if necessary, the need for more tests. Vaccines  Your health care provider  may recommend certain vaccines, such as: Influenza vaccine. This is recommended every year. Tetanus, diphtheria, and acellular  pertussis (Tdap, Td) vaccine. You may need a Td booster every 10 years. Zoster vaccine. You may need this after age 71. Pneumococcal 13-valent conjugate (PCV13) vaccine. One dose is recommended after age 71. Pneumococcal polysaccharide (PPSV23) vaccine. One dose is recommended after age 71. Talk to your health care provider about which screenings and vaccines you need and how often you need them. This information is not intended to replace advice given to you by your health care provider. Make sure you discuss any questions you have with your health care provider. Document Released: 03/09/2015 Document Revised: 10/31/2015 Document Reviewed: 12/12/2014 Elsevier Interactive Patient Education  2017 [place] Prevention in the Home Falls can cause injuries. They can happen to people of all ages. There are many things you can do to make your home safe and to help prevent falls. What can I do on the outside of my home? Regularly fix the edges of walkways and driveways and fix any cracks. Remove anything that might make you trip as you walk through a door, such as a raised step or threshold. Trim any bushes or trees on the path to your home. Use bright outdoor lighting. Clear any walking paths of anything that might make someone trip, such as rocks or tools. Regularly check to see if handrails are loose or broken. Make sure that both sides of any steps have handrails. Any raised decks and porches should have guardrails on the edges. Have any leaves, snow, or ice cleared regularly. Use sand or salt on walking paths during winter. Clean up any spills in your garage right away. This includes oil or grease spills. What can I do in the bathroom? Use night lights. Install grab bars by the toilet and in the tub and shower. Do not use towel bars as grab bars. Use non-skid mats or decals in the tub or shower. If you need to sit down in the shower, use a plastic, non-slip stool. Keep the floor  dry. Clean up any water that spills on the floor as soon as it happens. Remove soap buildup in the tub or shower regularly. Attach bath mats securely with double-sided non-slip rug tape. Do not have throw rugs and other things on the floor that can make you trip. What can I do in the bedroom? Use night lights. Make sure that you have a light by your bed that is easy to reach. Do not use any sheets or blankets that are too big for your bed. They should not hang down onto the floor. Have a firm chair that has side arms. You can use this for support while you get dressed. Do not have throw rugs and other things on the floor that can make you trip. What can I do in the kitchen? Clean up any spills right away. Avoid walking on wet floors. Keep items that you use a lot in easy-to-reach places. If you need to reach something above you, use a strong step stool that has a grab bar. Keep electrical cords out of the way. Do not use floor polish or wax that makes floors slippery. If you must use wax, use non-skid floor wax. Do not have throw rugs and other things on the floor that can make you trip. What can I do with my stairs? Do not leave any items on the stairs. Make sure that there are handrails on both sides  of the stairs and use them. Fix handrails that are broken or loose. Make sure that handrails are as long as the stairways. Check any carpeting to make sure that it is firmly attached to the stairs. Fix any carpet that is loose or worn. Avoid having throw rugs at the top or bottom of the stairs. If you do have throw rugs, attach them to the floor with carpet tape. Make sure that you have a light switch at the top of the stairs and the bottom of the stairs. If you do not have them, ask someone to add them for you. What else can I do to help prevent falls? Wear shoes that: Do not have high heels. Have rubber bottoms. Are comfortable and fit you well. Are closed at the toe. Do not wear  sandals. If you use a stepladder: Make sure that it is fully opened. Do not climb a closed stepladder. Make sure that both sides of the stepladder are locked into place. Ask someone to hold it for you, if possible. Clearly mark and make sure that you can see: Any grab bars or handrails. First and last steps. Where the edge of each step is. Use tools that help you move around (mobility aids) if they are needed. These include: Canes. Walkers. Scooters. Crutches. Turn on the lights when you go into a dark area. Replace any light bulbs as soon as they burn out. Set up your furniture so you have a clear path. Avoid moving your furniture around. If any of your floors are uneven, fix them. If there are any pets around you, be aware of where they are. Review your medicines with your doctor. Some medicines can make you feel dizzy. This can increase your chance of falling. Ask your doctor what other things that you can do to help prevent falls. This information is not intended to replace advice given to you by your health care provider. Make sure you discuss any questions you have with your health care provider. Document Released: 12/07/2008 Document Revised: 07/19/2015 Document Reviewed: 03/17/2014 Elsevier Interactive Patient Education  2017 [employer].

## 2021-10-01 ENCOUNTER — Encounter: Payer: Self-pay | Admitting: Nurse Practitioner

## 2021-10-01 ENCOUNTER — Ambulatory Visit: Payer: Medicare Other | Admitting: Nurse Practitioner

## 2021-10-01 VITALS — BP 136/81 | HR 73 | Temp 98.7°F | Ht 69.0 in | Wt 182.2 lb

## 2021-10-01 DIAGNOSIS — R300 Dysuria: Secondary | ICD-10-CM | POA: Diagnosis not present

## 2021-10-01 LAB — MICROSCOPIC EXAMINATION
RBC, Urine: NONE SEEN /hpf (ref 0–2)
Renal Epithel, UA: NONE SEEN /hpf
WBC, UA: NONE SEEN /hpf (ref 0–5)

## 2021-10-01 LAB — URINALYSIS, COMPLETE
Bilirubin, UA: NEGATIVE
Glucose, UA: NEGATIVE
Ketones, UA: NEGATIVE
Leukocytes,UA: NEGATIVE
Nitrite, UA: NEGATIVE
Protein,UA: NEGATIVE
RBC, UA: NEGATIVE
Specific Gravity, UA: 1.015 (ref 1.005–1.030)
Urobilinogen, Ur: 0.2 mg/dL (ref 0.2–1.0)
pH, UA: 6 (ref 5.0–7.5)

## 2021-10-01 NOTE — Progress Notes (Signed)
   Acute Office Visit  Subjective:     Patient ID: Terry Pacheco, male    DOB: 17-Mar-1950, 71 y.o.   MRN: [id number]  Chief Complaint  Patient presents with   Flank Pain    Left side    Flank Pain Associated symptoms include dysuria.    Review of Systems  Constitutional: Negative.   HENT: Negative.    Respiratory: Negative.    Cardiovascular: Negative.   Gastrointestinal: Negative.   Genitourinary:  Positive for dysuria, flank pain and frequency.  Skin: Negative.  Negative for itching and rash.  All other systems reviewed and are negative.       Objective:    BP 136/81   Pulse 73   Temp 98.7 F (37.1 C)   Ht '5\' 9"'$  (1.753 m)   Wt 182 lb 3.2 oz (82.6 kg)   SpO2 94%   BMI 26.91 kg/m  BP Readings from Last 3 Encounters:  10/01/21 136/81  04/18/21 119/68  01/10/21 134/77   Wt Readings from Last 3 Encounters:  10/01/21 182 lb 3.2 oz (82.6 kg)  07/31/21 187 lb (84.8 kg)  04/18/21 187 lb 12.8 oz (85.2 kg)      Physical Exam Vitals and nursing note reviewed.  Constitutional:      Appearance: Normal appearance.  HENT:     Head: Normocephalic.     Right Ear: External ear normal.     Left Ear: External ear normal.     Nose: Nose normal.     Mouth/Throat:     Mouth: Mucous membranes are moist.     Pharynx: Oropharynx is clear.  Eyes:     Conjunctiva/sclera: Conjunctivae normal.  Cardiovascular:     Rate and Rhythm: Normal rate and regular rhythm.     Pulses: Normal pulses.  Pulmonary:     Effort: Pulmonary effort is normal.     Breath sounds: Normal breath sounds.  Abdominal:     General: Bowel sounds are normal.  Musculoskeletal:        General: Normal range of motion.  Skin:    General: Skin is warm.  Neurological:     General: No focal deficit present.     Mental Status: He is alert and oriented to person, place, and time.  Psychiatric:        Mood and Affect: Mood normal.        Behavior: Behavior normal.     No results found for any visits  on 10/01/21.      Assessment & Plan:   UTI  vs  interstitial cystitis -urinalysis completed results pending -pyridium for pain Precaution and education provided -All questions answered -follow up with unresolved symptoms  Problem List Items Addressed This Visit   None Visit Diagnoses     Dysuria    -  Primary   Relevant Orders   Urine Culture   Urinalysis, Complete       No orders of the defined types were placed in this encounter.   Return if symptoms worsen or fail to improve.  Ivy Lynn, NP

## 2021-10-01 NOTE — Patient Instructions (Signed)

## 2021-10-02 ENCOUNTER — Other Ambulatory Visit: Payer: Self-pay | Admitting: Nurse Practitioner

## 2021-10-02 ENCOUNTER — Telehealth: Payer: Self-pay | Admitting: Family Medicine

## 2021-10-02 DIAGNOSIS — R300 Dysuria: Secondary | ICD-10-CM

## 2021-10-02 MED ORDER — CEPHALEXIN 500 MG PO CAPS: 500.0000 mg | ORAL_CAPSULE | Freq: Two times a day (BID) | ORAL | 0 refills | Status: DC

## 2021-10-02 NOTE — Telephone Encounter (Signed)
Patient notified

## 2021-10-02 NOTE — Telephone Encounter (Signed)
I sent keflex for dysuria, follow up with unresolved symptoms

## 2021-10-03 LAB — URINE CULTURE

## 2021-10-15 ENCOUNTER — Ambulatory Visit: Payer: 59 | Admitting: Family Medicine

## 2021-10-15 ENCOUNTER — Encounter: Payer: Self-pay | Admitting: Family Medicine

## 2021-10-17 ENCOUNTER — Other Ambulatory Visit: Payer: Self-pay | Admitting: Family Medicine

## 2021-10-21 ENCOUNTER — Ambulatory Visit (INDEPENDENT_AMBULATORY_CARE_PROVIDER_SITE_OTHER): Payer: 59 | Admitting: Family Medicine

## 2021-10-21 ENCOUNTER — Encounter: Payer: Self-pay | Admitting: Family Medicine

## 2021-10-21 VITALS — BP 134/68 | HR 67 | Temp 98.2°F | Ht 69.0 in | Wt 188.0 lb

## 2021-10-21 DIAGNOSIS — Z125 Encounter for screening for malignant neoplasm of prostate: Secondary | ICD-10-CM | POA: Diagnosis not present

## 2021-10-21 DIAGNOSIS — I10 Essential (primary) hypertension: Secondary | ICD-10-CM | POA: Diagnosis not present

## 2021-10-21 DIAGNOSIS — K219 Gastro-esophageal reflux disease without esophagitis: Secondary | ICD-10-CM

## 2021-10-21 DIAGNOSIS — E785 Hyperlipidemia, unspecified: Secondary | ICD-10-CM | POA: Diagnosis not present

## 2021-10-21 DIAGNOSIS — E559 Vitamin D deficiency, unspecified: Secondary | ICD-10-CM | POA: Diagnosis not present

## 2021-10-21 MED ORDER — ATORVASTATIN CALCIUM 40 MG PO TABS: 40.0000 mg | ORAL_TABLET | Freq: Every day | ORAL | 3 refills | Status: DC

## 2021-10-21 MED ORDER — OMEPRAZOLE 20 MG PO CPDR: DELAYED_RELEASE_CAPSULE | ORAL | 3 refills | Status: DC

## 2021-10-21 MED ORDER — AMLODIPINE BESYLATE 10 MG PO TABS: ORAL_TABLET | ORAL | 3 refills | Status: DC

## 2021-10-21 NOTE — Progress Notes (Signed)
Subjective:  Patient ID: Terry Pacheco, male    DOB: [birth year]  Age: 71 y.o. MRN: [id number]  CC: Medical Management of Chronic Issues   HPI ROALD LUKACS presents for  presents for  follow-up of hypertension. Patient has no history of headache chest pain or shortness of breath or recent cough. Patient also denies symptoms of TIA such as focal numbness or weakness. Patient denies side effects from medication. States taking it regularly. He has BPH is under good control with current medication.  He does have to get up to go to the bathroom sometimes once or twice a night.  Patient in for follow-up of elevated cholesterol. Doing well without complaints on current medication. Denies side effects of statin including myalgia and arthralgia and nausea. Also in today for liver function testing. Currently no chest pain, shortness of breath or other cardiovascular related symptoms noted.    10/21/2021    9:00 AM 10/01/2021   12:44 PM 07/31/2021   11:58 AM  Depression screen PHQ 2/9  Decreased Interest 0 0 0  Down, Depressed, Hopeless 0 0 0  PHQ - 2 Score 0 0 0    History Kern has a past medical history of Aortic stenosis, Arthritis, Cancer (HCC) (10/2016), GERD (gastroesophageal reflux disease) (07/06/2014), History of radiation therapy (12/30/2016- 02/18/2017), Hypertension, Sleep apnea, and Wears glasses.   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   His family history includes Heart disease in his mother; Stroke in his father.He reports that he quit smoking about 4  years ago. His smoking use included cigarettes. He started smoking about 38 years ago. He has a 22.50 pack-year smoking history. He has never used smokeless tobacco. He reports that he does not currently use alcohol. He reports that he does not currently use drugs after having used the following drugs: Marijuana.    ROS Review of Systems  Constitutional:  Negative for fever.  Respiratory:  Negative for shortness of breath.   Cardiovascular:  Negative for chest pain.  Musculoskeletal:  Positive for arthralgias (right knee pain had injection by ortho - should last a year he was told. Now taking glucosamine.).  Skin:  Negative for rash.    Objective:  BP 134/68   Pulse 67   Temp 98.2 F (36.8 C)   Ht 5\' 9"  (1.753 m)   Wt 188 lb (85.3 kg)   SpO2 94%   BMI 27.76 kg/m   BP Readings from Last 3 Encounters:  10/21/21 134/68  10/01/21 136/81  04/18/21 119/68    Wt Readings from Last 3 Encounters:  10/21/21 188 lb (85.3 kg)  10/01/21 182 lb 3.2 oz (82.6 kg)  07/31/21 187 lb (84.8 kg)     Physical Exam Vitals reviewed.  Constitutional:      Appearance: He is well-developed.  HENT:     Head: Normocephalic and atraumatic.     Right Ear: External ear normal.     Left Ear: External ear normal.     Mouth/Throat:     Pharynx: No oropharyngeal exudate or posterior oropharyngeal erythema.  Eyes:     Pupils: Pupils are  equal, round, and reactive to light.  Cardiovascular:     Rate and Rhythm: Normal rate and regular rhythm.     Heart sounds: No murmur heard. Pulmonary:     Effort: No respiratory distress.     Breath sounds: Normal breath sounds.  Musculoskeletal:     Cervical back: Normal range of motion and neck supple.  Neurological:     Mental Status: He is alert and oriented to person, place, and time.       Assessment & Plan:   Gilliam was seen today for medical management of chronic issues.  Diagnoses and all orders for this visit:  Primary hypertension -      CBC with Differential/Platelet -     CMP14+EGFR -     amLODipine (NORVASC) 10 MG tablet; TAKE 1 TABLET EVERY DAY FOR FOR BLOOD PRESSURE  Hyperlipidemia with target LDL less than 100 -     Lipid panel -     atorvastatin (LIPITOR) 40 MG tablet; Take 1 tablet (40 mg total) by mouth daily.  Vitamin D deficiency -     VITAMIN D 25 Hydroxy (Vit-D Deficiency, Fractures)  Screening for prostate cancer -     PSA, total and free  Essential hypertension -     amLODipine (NORVASC) 10 MG tablet; TAKE 1 TABLET EVERY DAY FOR FOR BLOOD PRESSURE  Gastroesophageal reflux disease, unspecified whether esophagitis present -     omeprazole (PRILOSEC) 20 MG capsule; TAKE 1 CAPSULE BY MOUTH EVERY DAY       I have discontinued Jeneen Rinks R. Toure's cephALEXin. I am also having him maintain his fluticasone, aspirin EC, acetaminophen, calcium carbonate, B Complex, tamsulosin, escitalopram, ezetimibe, amLODipine, atorvastatin, and omeprazole.  Allergies as of 10/21/2021       Reactions   Crestor [rosuvastatin]    Muscle aches        Medication List        Accurate as of October 21, 2021  5:51 PM. If you have any questions, ask your nurse or doctor.          STOP taking these medications    cephALEXin 500 MG capsule Commonly known as: Keflex Stopped by: Claretta Fraise, MD       TAKE these medications    acetaminophen 500 MG tablet Commonly known as: TYLENOL Take 1,000 mg by mouth daily as needed for moderate pain or headache.   amLODipine 10 MG tablet Commonly known as: NORVASC TAKE 1 TABLET EVERY DAY FOR FOR BLOOD PRESSURE   aspirin EC 81 MG tablet Take 81 mg by mouth daily.   atorvastatin 40 MG tablet Commonly known as: LIPITOR Take 1 tablet (40 mg total) by mouth daily.   B Complex Caps TAKE 1 CAPSULE BY MOUTH EVERY DAY   calcium carbonate 500 MG chewable tablet Commonly known as: TUMS - dosed in mg elemental calcium Chew 2 tablets by mouth daily as needed for indigestion  or heartburn.   escitalopram 20 MG tablet Commonly known as: LEXAPRO Take 1 tablet (20 mg total) by mouth daily.   ezetimibe 10 MG tablet Commonly known as: ZETIA Take 1 tablet (10 mg total) by mouth daily.   fluticasone 50 MCG/ACT nasal spray Commonly known as: FLONASE SPRAY 2 SPRAYS INTO EACH NOSTRIL EVERY DAY   omeprazole 20 MG capsule Commonly known as: PRILOSEC TAKE 1 CAPSULE BY MOUTH EVERY DAY   tamsulosin 0.4 MG Caps capsule Commonly known as: FLOMAX Take 2 capsules (0.8 mg total) by mouth at bedtime. For  urine flow and prostate         Follow-up: Return in about 6 months (around 04/23/2022).  Claretta Fraise, M.D.

## 2021-10-22 ENCOUNTER — Other Ambulatory Visit: Payer: Self-pay | Admitting: *Deleted

## 2021-10-22 LAB — CBC WITH DIFFERENTIAL/PLATELET
Basophils Absolute: 0 10*3/uL (ref 0.0–0.2)
Basos: 1 %
EOS (ABSOLUTE): 0.6 10*3/uL — ABNORMAL HIGH (ref 0.0–0.4)
Eos: 10 %
Hematocrit: 43.1 % (ref 37.5–51.0)
Hemoglobin: 14.6 g/dL (ref 13.0–17.7)
Immature Grans (Abs): 0 10*3/uL (ref 0.0–0.1)
Immature Granulocytes: 0 %
Lymphocytes Absolute: 1.1 10*3/uL (ref 0.7–3.1)
Lymphs: 20 %
MCH: 31.5 pg (ref 26.6–33.0)
MCHC: 33.9 g/dL (ref 31.5–35.7)
MCV: 93 fL (ref 79–97)
Monocytes Absolute: 0.4 10*3/uL (ref 0.1–0.9)
Monocytes: 7 %
Neutrophils Absolute: 3.6 10*3/uL (ref 1.4–7.0)
Neutrophils: 62 %
Platelets: 166 10*3/uL (ref 150–450)
RBC: 4.63 x10E6/uL (ref 4.14–5.80)
RDW: 13.1 % (ref 11.6–15.4)
WBC: 5.8 10*3/uL (ref 3.4–10.8)

## 2021-10-22 LAB — CMP14+EGFR
ALT: 11 IU/L (ref 0–44)
AST: 12 IU/L (ref 0–40)
Albumin/Globulin Ratio: 2.4 — ABNORMAL HIGH (ref 1.2–2.2)
Albumin: 4.5 g/dL (ref 3.8–4.8)
Alkaline Phosphatase: 105 IU/L (ref 44–121)
BUN/Creatinine Ratio: 12 (ref 10–24)
BUN: 12 mg/dL (ref 8–27)
Bilirubin Total: 0.5 mg/dL (ref 0.0–1.2)
CO2: 22 mmol/L (ref 20–29)
Calcium: 9.3 mg/dL (ref 8.6–10.2)
Chloride: 100 mmol/L (ref 96–106)
Creatinine, Ser: 1.02 mg/dL (ref 0.76–1.27)
Globulin, Total: 1.9 g/dL (ref 1.5–4.5)
Glucose: 103 mg/dL — ABNORMAL HIGH (ref 70–99)
Potassium: 4.6 mmol/L (ref 3.5–5.2)
Sodium: 136 mmol/L (ref 134–144)
Total Protein: 6.4 g/dL (ref 6.0–8.5)
eGFR: 79 mL/min/{1.73_m2} (ref >59)

## 2021-10-22 LAB — LIPID PANEL
Chol/HDL Ratio: 2 ratio (ref 0.0–5.0)
Cholesterol, Total: 110 mg/dL (ref 100–199)
HDL: 54 mg/dL (ref >39)
LDL Chol Calc (NIH): 45 mg/dL (ref 0–99)
Triglycerides: 39 mg/dL (ref 0–149)
VLDL Cholesterol Cal: 11 mg/dL (ref 5–40)

## 2021-10-22 LAB — PSA, TOTAL AND FREE
PSA, Free Pct: 18.7 %
PSA, Free: 0.28 ng/mL
Prostate Specific Ag, Serum: 1.5 ng/mL (ref 0.0–4.0)

## 2021-10-22 LAB — VITAMIN D 25 HYDROXY (VIT D DEFICIENCY, FRACTURES): Vit D, 25-Hydroxy: 26 ng/mL — ABNORMAL LOW (ref 30.0–100.0)

## 2021-10-22 MED ORDER — VITAMIN D (ERGOCALCIFEROL) 1.25 MG (50000 UNIT) PO CAPS: 50000.0000 [IU] | ORAL_CAPSULE | ORAL | 3 refills | Status: DC

## 2021-10-22 NOTE — Progress Notes (Signed)
Dear Terry Pacheco, Your Vitamin D is  low. You need a prescription strength supplement I will send that in for you. Nurse, if at all possible, could you send in a prescription for the patient for vitamin D 50,000 units, 1 p.o. weekly #13 with 3 refills? Many thanks, WS

## 2021-11-04 ENCOUNTER — Encounter: Payer: Self-pay | Admitting: Nurse Practitioner

## 2021-11-04 ENCOUNTER — Ambulatory Visit (INDEPENDENT_AMBULATORY_CARE_PROVIDER_SITE_OTHER): Payer: Medicare Other | Admitting: Nurse Practitioner

## 2021-11-04 VITALS — BP 132/72 | HR 80 | Temp 98.7°F | Ht 69.0 in | Wt 187.0 lb

## 2021-11-04 DIAGNOSIS — R300 Dysuria: Secondary | ICD-10-CM

## 2021-11-04 LAB — URINALYSIS, COMPLETE
Bilirubin, UA: NEGATIVE
Glucose, UA: NEGATIVE
Ketones, UA: NEGATIVE
Nitrite, UA: NEGATIVE
Protein,UA: NEGATIVE
RBC, UA: NEGATIVE
Specific Gravity, UA: 1.015 (ref 1.005–1.030)
Urobilinogen, Ur: 0.2 mg/dL (ref 0.2–1.0)
pH, UA: 6.5 (ref 5.0–7.5)

## 2021-11-04 LAB — MICROSCOPIC EXAMINATION
Bacteria, UA: NONE SEEN
RBC, Urine: NONE SEEN /hpf (ref 0–2)
Renal Epithel, UA: NONE SEEN /hpf
WBC, UA: NONE SEEN /hpf (ref 0–5)

## 2021-11-04 MED ORDER — CEPHALEXIN 500 MG PO CAPS: 500.0000 mg | ORAL_CAPSULE | Freq: Two times a day (BID) | ORAL | 0 refills | Status: DC

## 2021-11-04 NOTE — Progress Notes (Signed)
Acute Office Visit  Subjective:     Patient ID: Terry Pacheco, male    DOB: [birth year], 71 y.o.   MRN: [id number]  Chief Complaint  Patient presents with   Back Pain    Shooting lower back for about a week now     Dysuria  This is a new problem. The current episode started in the past 7 days. The problem occurs every urination. The problem has been unchanged. The quality of the pain is described as burning and aching. The pain is mild. There has been no fever. Associated symptoms include chills, flank pain and urgency. Pertinent negatives include no hematuria or nausea. He has tried nothing for the symptoms.     Review of Systems  Constitutional:  Positive for chills.  Eyes: Negative.   Gastrointestinal:  Negative for nausea.  Genitourinary:  Positive for dysuria, flank pain and urgency. Negative for hematuria.  Musculoskeletal:  Positive for back pain.  Skin: Negative.  Negative for itching and rash.  All other systems reviewed and are negative.       Objective:    BP 132/72   Pulse 80   Temp 98.7 F (37.1 C)   Ht '5\' 9"'$  (1.753 m)   Wt 187 lb (84.8 kg)   SpO2 93%   BMI 27.62 kg/m  BP Readings from Last 3 Encounters:  11/04/21 132/72  10/21/21 134/68  10/01/21 136/81      Physical Exam Vitals and nursing note reviewed.  Constitutional:      Appearance: Normal appearance.  HENT:     Head: Normocephalic.     Right Ear: External ear normal.     Left Ear: External ear normal.     Nose: Nose normal. No congestion.     Mouth/Throat:     Mouth: Mucous membranes are moist.     Pharynx: Oropharynx is clear.  Eyes:     Conjunctiva/sclera: Conjunctivae normal.  Cardiovascular:     Rate and Rhythm: Normal rate and regular rhythm.     Pulses: Normal pulses.     Heart sounds: Normal heart sounds.  Pulmonary:     Effort: Pulmonary effort is normal.     Breath sounds: Normal breath sounds.  Abdominal:     General: Bowel sounds are normal.     Tenderness: There  is right CVA tenderness and left CVA tenderness.  Neurological:     Mental Status: He is alert.     Results for orders placed or performed in visit on 11/04/21  Microscopic Examination   Urine  Result Value Ref Range   WBC, UA None seen 0 - 5 /hpf   RBC, Urine None seen 0 - 2 /hpf   Epithelial Cells (non renal) 0-10 0 - 10 /hpf   Renal Epithel, UA None seen None seen /hpf   Bacteria, UA None seen None seen/Few  Urinalysis, Complete  Result Value Ref Range   Specific Gravity, UA 1.015 1.005 - 1.030   pH, UA 6.5 5.0 - 7.5   Color, UA Yellow Yellow   Appearance Ur Clear Clear   Leukocytes,UA Trace (A) Negative   Protein,UA Negative Negative/Trace   Glucose, UA Negative Negative   Ketones, UA Negative Negative   RBC, UA Negative Negative   Bilirubin, UA Negative Negative   Urobilinogen, Ur 0.2 0.2 - 1.0 mg/dL   Nitrite, UA Negative Negative   Microscopic Examination See below:         Assessment & Plan:  Patient presents with  unresolved UTI symptoms.  Rocephin 1 g shot given in clinic. UTI  vs  interstitial cystitis -urinalysis completed results pending -pyridium for pain -Keflex Precaution and education provided -All questions answered -follow up with unresolved symptoms  Problem List Items Addressed This Visit   None Visit Diagnoses     Dysuria    -  Primary   Relevant Medications   cephALEXin (KEFLEX) 500 MG capsule   Other Relevant Orders   Urine Culture   Urinalysis, Complete (Completed)       Meds ordered this encounter  Medications   cephALEXin (KEFLEX) 500 MG capsule    Sig: Take 1 capsule (500 mg total) by mouth 2 (two) times daily.    Dispense:  14 capsule    Refill:  0    Order Specific Question:   Supervising Provider    Answer:   Claretta Fraise [phone]    No follow-ups on file.  Ivy Lynn, NP

## 2021-11-06 LAB — URINE CULTURE

## 2021-11-07 ENCOUNTER — Telehealth: Payer: Self-pay | Admitting: Family Medicine

## 2021-11-07 ENCOUNTER — Other Ambulatory Visit: Payer: Self-pay | Admitting: Family Medicine

## 2021-11-07 DIAGNOSIS — R300 Dysuria: Secondary | ICD-10-CM

## 2021-11-07 MED ORDER — CEPHALEXIN 500 MG PO CAPS: 500.0000 mg | ORAL_CAPSULE | Freq: Two times a day (BID) | ORAL | 0 refills | Status: DC

## 2021-11-07 NOTE — Telephone Encounter (Signed)
Pt wants another round of cephALEXin (KEFLEX) 500 MG capsule. Some better but wants to great and know that infection is gone. Use [facility]

## 2021-11-07 NOTE — Telephone Encounter (Signed)
I sent in the refill he requested.

## 2021-12-13 ENCOUNTER — Encounter: Payer: Self-pay | Admitting: Nurse Practitioner

## 2021-12-13 ENCOUNTER — Ambulatory Visit: Payer: Medicare Other | Admitting: Nurse Practitioner

## 2021-12-13 VITALS — BP 94/57 | HR 74 | Temp 97.4°F | Resp 20 | Ht 69.0 in | Wt 184.0 lb

## 2021-12-13 DIAGNOSIS — R0981 Nasal congestion: Secondary | ICD-10-CM

## 2021-12-13 LAB — VERITOR FLU A/B WAIVED
Influenza A: NEGATIVE
Influenza B: NEGATIVE

## 2021-12-13 NOTE — Progress Notes (Signed)
Subjective:    Patient ID: Terry Pacheco, male    DOB: 04-Nov-1950, 71 y.o.   MRN: [id number]   Chief Complaint: Cough (/), Nasal Congestion, and Generalized Body Aches   Pt seen today for cough and congestion x 1 week, no known sick exposures.  Cough This is a new problem. The current episode started in the past 7 days. The problem has been gradually worsening. The problem occurs hourly. The cough is Productive of sputum. Associated symptoms include chills, headaches, myalgias, nasal congestion, postnasal drip, rhinorrhea and a sore throat. Pertinent negatives include no chest pain, ear congestion, ear pain, fever, heartburn, shortness of breath or sweats. Associated symptoms comments:  vomiting. Nothing aggravates the symptoms. Risk factors for lung disease include smoking/tobacco exposure. He has tried OTC cough suppressant for the symptoms. The treatment provided no relief. There is no history of asthma, COPD or emphysema.       Review of Systems  Constitutional:  Positive for chills and fatigue. Negative for appetite change and fever.  HENT:  Positive for postnasal drip, rhinorrhea and sore throat. Negative for ear pain, sinus pressure and sinus pain.   Respiratory:  Positive for cough. Negative for shortness of breath.   Cardiovascular:  Negative for chest pain.  Gastrointestinal:  Positive for vomiting. Negative for abdominal pain, diarrhea, heartburn and nausea.  Endocrine: Negative for polydipsia and polyphagia.  Musculoskeletal:  Positive for myalgias. Negative for arthralgias and neck stiffness.  Neurological:  Positive for headaches. Negative for weakness.       Objective:   Physical Exam Vitals and nursing note reviewed.  Constitutional:      General: He is not in acute distress.    Appearance: Normal appearance. He is well-developed.  HENT:     Head: Normocephalic.     Right Ear: Tympanic membrane normal.     Left Ear: Tympanic membrane normal.     Nose:  Congestion present.     Mouth/Throat:     Mouth: Mucous membranes are moist.     Pharynx: Posterior oropharyngeal erythema present. No oropharyngeal exudate.  Eyes:     Conjunctiva/sclera: Conjunctivae normal.     Pupils: Pupils are equal, round, and reactive to light.  Cardiovascular:     Rate and Rhythm: Normal rate and regular rhythm.     Heart sounds: Normal heart sounds.  Pulmonary:     Effort: Pulmonary effort is normal. No respiratory distress.     Breath sounds: Normal breath sounds. No wheezing, rhonchi or rales.  Abdominal:     General: Bowel sounds are normal.     Palpations: Abdomen is soft.     Tenderness: There is no abdominal tenderness.  Musculoskeletal:     Cervical back: Normal range of motion.  Lymphadenopathy:     Cervical: No cervical adenopathy.  Skin:    General: Skin is warm and dry.  Neurological:     General: No focal deficit present.     Mental Status: He is alert and oriented to person, place, and time.  Psychiatric:        Mood and Affect: Mood normal.        Behavior: Behavior normal.     BP (!) 94/57   Pulse 74   Temp (!) 97.4 F (36.3 C) (Temporal)   Resp 20   Ht '5\' 9"'$  (1.753 m)   Wt 184 lb (83.5 kg)   SpO2 96%   BMI 27.17 kg/m        Assessment &  Plan:   [name] in today with chief complaint of Cough (/), Nasal Congestion, and Generalized Body Aches   1. Congestion of nasal sinus -Rapid flu negative -COVID pending Force fluids Rest Symptom management RTO PRN for new/worse symptoms or if symptoms do not improve  - Veritor Flu A/B Waived - Novel Coronavirus, NAA ([facility])    The above assessment and management plan was discussed with the patient. The patient verbalized understanding of and has agreed to the management plan. Patient is aware to call the clinic if symptoms persist or worsen. Patient is aware when to return to the clinic for a follow-up visit. Patient educated on when it is appropriate to go to the  emergency department.   Collene Leyden, FNP student  [place], [place]

## 2021-12-13 NOTE — Patient Instructions (Signed)
1. Take meds as prescribed 2. Use a cool mist humidifier especially during the winter months and when heat has been humid. 3. Use saline nose sprays frequently 4. Saline irrigations of the nose can be very helpful if done frequently.  * 4X daily for 1 week*  * Use of a nettie pot can be helpful with this. Follow directions with this* 5. Drink plenty of fluids 6. Keep thermostat turn down low 7.For any cough or congestion- mucinex OTC as needed 8. For fever or aces or pains- take tylenol or ibuprofen appropriate for age and weight.  * for fevers greater than 101 orally you may alternate ibuprofen and tylenol every  3 hours.    

## 2021-12-14 LAB — NOVEL CORONAVIRUS, NAA: SARS-CoV-2, NAA: NOT DETECTED

## 2022-01-17 ENCOUNTER — Other Ambulatory Visit: Payer: Self-pay | Admitting: Family Medicine

## 2022-01-22 ENCOUNTER — Encounter: Payer: Self-pay | Admitting: Family Medicine

## 2022-01-22 ENCOUNTER — Ambulatory Visit (INDEPENDENT_AMBULATORY_CARE_PROVIDER_SITE_OTHER): Payer: Medicare Other

## 2022-01-22 ENCOUNTER — Emergency Department (HOSPITAL_COMMUNITY): Payer: Medicare Other

## 2022-01-22 ENCOUNTER — Ambulatory Visit: Payer: Medicare Other | Admitting: Family Medicine

## 2022-01-22 ENCOUNTER — Emergency Department (HOSPITAL_COMMUNITY)
Admission: EM | Admit: 2022-01-22 | Discharge: 2022-01-22 | Disposition: A | Discharge status: Home / Self Care | Payer: Medicare Other | Attending: Emergency Medicine | Admitting: Emergency Medicine

## 2022-01-22 VITALS — BP 97/62 | HR 95 | Temp 97.7°F | Ht 69.0 in | Wt 187.8 lb

## 2022-01-22 DIAGNOSIS — S2241XD Multiple fractures of ribs, right side, subsequent encounter for fracture with routine healing: Secondary | ICD-10-CM

## 2022-01-22 DIAGNOSIS — W19XXXA Unspecified fall, initial encounter: Secondary | ICD-10-CM

## 2022-01-22 DIAGNOSIS — I10 Essential (primary) hypertension: Secondary | ICD-10-CM | POA: Diagnosis not present

## 2022-01-22 DIAGNOSIS — R0781 Pleurodynia: Secondary | ICD-10-CM | POA: Diagnosis not present

## 2022-01-22 DIAGNOSIS — Z79899 Other long term (current) drug therapy: Secondary | ICD-10-CM | POA: Diagnosis not present

## 2022-01-22 DIAGNOSIS — M25511 Pain in right shoulder: Secondary | ICD-10-CM | POA: Diagnosis not present

## 2022-01-22 DIAGNOSIS — S2241XA Multiple fractures of ribs, right side, initial encounter for closed fracture: Secondary | ICD-10-CM | POA: Diagnosis not present

## 2022-01-22 DIAGNOSIS — Z7982 Long term (current) use of aspirin: Secondary | ICD-10-CM | POA: Diagnosis not present

## 2022-01-22 DIAGNOSIS — R0789 Other chest pain: Secondary | ICD-10-CM | POA: Diagnosis not present

## 2022-01-22 DIAGNOSIS — S299XXA Unspecified injury of thorax, initial encounter: Secondary | ICD-10-CM | POA: Diagnosis present

## 2022-01-22 MED ORDER — OXYCODONE-ACETAMINOPHEN 5-325 MG PO TABS: 1.0000 | ORAL_TABLET | Freq: Three times a day (TID) | ORAL | 0 refills | Status: DC | PRN

## 2022-01-22 MED ORDER — TRAMADOL HCL 50 MG PO TABS: 50.0000 mg | ORAL_TABLET | Freq: Three times a day (TID) | ORAL | 0 refills | Status: AC | PRN

## 2022-01-22 MED ORDER — OXYCODONE-ACETAMINOPHEN 5-325 MG PO TABS
1.0000 | ORAL_TABLET | Freq: Once | ORAL | Status: AC
  Administered 2022-01-22: 1 via ORAL
  Filled 2022-01-22: qty 1

## 2022-01-22 NOTE — ED Notes (Signed)
Delay in d/c d/t influx of pts

## 2022-01-22 NOTE — Progress Notes (Signed)
Subjective:  Patient ID: Terry Pacheco, male    DOB: 1950-03-02, 71 y.o.   MRN: [id number]  Patient Care Team: Claretta Fraise, MD as PCP - General (Family Medicine) Jodi Marble, MD as Consulting Physician (Otolaryngology) Eppie Gibson, MD as Attending Physician (Radiation Oncology) Leota Sauers, RN (Inactive) as Oncology Nurse Navigator (Oncology) Karie Mainland, RD as Dietitian (Nutrition) Jomarie Longs, PT (Inactive) as Physical Therapist (Physical Therapy) Sharen Counter, CCC-SLP as Speech Language Pathologist (Speech Pathology) Kennith Center, LCSW as Social Worker Lavonna Monarch, MD (Inactive) as Consulting Physician (Dermatology)   Chief Complaint:  Fall (Patient states he had a fall on Thanksgiving and now c/o right shoulder pain and right rib pain. )   HPI: [name] is a 71 y.o. male presenting on 01/22/2022 for Fall (Patient states he had a fall on Thanksgiving and now c/o right shoulder pain and right rib pain. )   Fall Incident onset: 01/16/2022. The fall occurred while walking. He fell from a height of 3 to 5 ft. Impact surface: edge of steps. The point of impact was the right shoulder (right side). The pain is present in the right shoulder (right ribs). The pain is moderate. The symptoms are aggravated by standing, use of injured limb, rotation, pressure on injury, movement, flexion and extension. Pertinent negatives include no abdominal pain, bowel incontinence, fever, headaches, hearing loss, hematuria, loss of consciousness, nausea, numbness, tingling, visual change or vomiting. He has tried acetaminophen for the symptoms. The treatment provided no relief.     Relevant past medical, surgical, family, and social history reviewed and updated as indicated.  Allergies and medications reviewed and updated. Data reviewed: Chart in Epic.   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    Hypertension    Sleep apnea    does not use CPAP   Wears glasses     Past Surgical History:  Procedure Laterality Date   HERNIA REPAIR     right inguinal hernia   IR CV LINE INJECTION  01/14/2017   IR FLUORO GUIDE PORT INSERTION RIGHT  12/25/2016   IR GASTROSTOMY TUBE MOD SED  12/25/2016   IR GASTROSTOMY TUBE REMOVAL  06/05/2017   IR REMOVAL TUN ACCESS W/ PORT W/O FL MOD SED  03/06/2017   IR US GUIDE VASC ACCESS RIGHT  12/25/2016   LEFT HEART CATH AND CORONARY ANGIOGRAPHY N/A 12/07/2017   Procedure: LEFT HEART CATH AND CORONARY ANGIOGRAPHY;  Surgeon: Belva Crome, MD;  Location: [facility];  Service: Cardiovascular;  Laterality: N/A;   polyp removal  2008   during colonoscopy   TOTAL HIP ARTHROPLASTY Right 10/16/2015   Procedure: RIGHT TOTAL HIP ARTHROPLASTY ANTERIOR APPROACH;  Surgeon: Paralee Cancel, MD;  Location: [facility];  Service: Orthopedics;  Laterality: Right;   vocal cord polyp      removed 2-3 years ago     Social History   Socioeconomic History   Marital status: Married  Spouse name: Not on file   Number of children: 1   Years of education: Not on file   Highest education level: Not on file  Occupational History   Occupation: Retired     Fish farm manager: [employer]  Tobacco Use   Smoking status: Former    Packs/day: 0.50    Years: 45.00    Total pack years: 22.50    Types: Cigarettes    Start date: 01/12/1983    Quit date: 11/17/2016    Years since quitting: 5.1   Smokeless tobacco: Never  Vaping Use   Vaping Use: Never used  Substance and Sexual  Activity   Alcohol use: Not Currently    Alcohol/week: 0.0 standard drinks of alcohol   Drug use: Not Currently    Types: Marijuana    Comment: last use 10/09/2015   Sexual activity: Not on file  Other Topics Concern   Not on file  Social History Narrative   Not on file   Social Determinants of Health   Financial Resource Strain: Low Risk  (07/31/2021)   Overall Financial Resource Strain (CARDIA)    Difficulty of Paying Living Expenses: Not hard at all  Food Insecurity: No Food Insecurity (07/31/2021)   Hunger Vital Sign    Worried About Running Out of Food in the Last Year: Never true    [place] in the Last Year: Never true  Transportation Needs: No Transportation Needs (07/31/2021)   PRAPARE - Hydrologist (Medical): No    Lack of Transportation (Non-Medical): No  Physical Activity: Insufficiently Active (07/31/2021)   Exercise Vital Sign    Days of Exercise per Week: 3 days    Minutes of Exercise per Session: 30 min  Stress: No Stress Concern Present (07/31/2021)   Intercourse    Feeling of Stress : Not at all  Social Connections: [place] (07/31/2021)   Social Connection and Isolation Panel [NHANES]    Frequency of Communication with Friends and Family: More than three times a week    Frequency of Social Gatherings with Friends and Family: More than three times a week    Attends Religious Services: More than 4 times per year    Active Member of [employer] or Organizations: Yes    Attends Music therapist: More than 4 times per year    Marital Status: Married  Human resources officer Violence: Not At Risk (07/31/2021)   Humiliation, Afraid, Rape, and Kick questionnaire    Fear of Current or Ex-Partner: No    Emotionally Abused: No    Physically Abused: No    Sexually Abused: No    Outpatient Encounter Medications as of 01/22/2022  Medication Sig   acetaminophen  (TYLENOL) 500 MG tablet Take 1,000 mg by mouth daily as needed for moderate pain or headache.   amLODipine (NORVASC) 10 MG tablet TAKE 1 TABLET EVERY DAY FOR FOR BLOOD PRESSURE   aspirin EC 81 MG tablet Take 81 mg by mouth daily.   atorvastatin (LIPITOR) 40 MG tablet Take 1 tablet (40 mg total) by mouth daily.   B Complex CAPS TAKE 1 CAPSULE BY MOUTH EVERY DAY   calcium carbonate (TUMS - DOSED IN MG ELEMENTAL CALCIUM) 500 MG chewable tablet Chew 2 tablets by mouth daily as needed for indigestion or heartburn.   escitalopram (LEXAPRO) 20 MG tablet Take 1 tablet (20 mg total) by mouth daily.   ezetimibe (ZETIA) 10  MG tablet TAKE 1 TABLET BY MOUTH EVERY DAY   fluticasone (FLONASE) 50 MCG/ACT nasal spray SPRAY 2 SPRAYS INTO EACH NOSTRIL EVERY DAY   omeprazole (PRILOSEC) 20 MG capsule TAKE 1 CAPSULE BY MOUTH EVERY DAY   tamsulosin (FLOMAX) 0.4 MG CAPS capsule Take 2 capsules (0.8 mg total) by mouth at bedtime. For urine flow and prostate   traMADol (ULTRAM) 50 MG tablet Take 1 tablet (50 mg total) by mouth every 8 (eight) hours as needed for up to 5 days.   Vitamin D, Ergocalciferol, (DRISDOL) 1.25 MG (50000 UNIT) CAPS capsule Take 1 capsule (50,000 Units total) by mouth every 7 (seven) days.   No facility-administered encounter medications on file as of 01/22/2022.    Allergies  Allergen Reactions   Crestor [Rosuvastatin]     Muscle aches    Review of Systems  Constitutional:  Positive for activity change and appetite change. Negative for chills, diaphoresis, fatigue, fever and unexpected weight change.  Eyes:  Negative for photophobia and visual disturbance.  Respiratory:  Negative for apnea, cough, choking, chest tightness, shortness of breath, wheezing and stridor.        Difficult to take deep breath  Cardiovascular:  Negative for chest pain, palpitations and leg swelling.       Rib pain, right sided  Gastrointestinal:  Negative for abdominal distention, abdominal pain, anal bleeding,  blood in stool, bowel incontinence, constipation, diarrhea, nausea, rectal pain and vomiting.  Genitourinary:  Negative for decreased urine volume, difficulty urinating and hematuria.  Musculoskeletal:  Positive for arthralgias. Negative for back pain, gait problem, joint swelling, myalgias, neck pain and neck stiffness.  Skin:  Positive for color change and wound.  Neurological:  Negative for dizziness, tingling, tremors, seizures, loss of consciousness, syncope, facial asymmetry, speech difficulty, weakness, light-headedness, numbness and headaches.  Psychiatric/Behavioral:  Negative for confusion.   All other systems reviewed and are negative.       Objective:  BP 97/62   Pulse 95   Temp 97.7 F (36.5 C) (Temporal)   Ht '5\' 9"'$  (1.753 m)   Wt 187 lb 12.8 oz (85.2 kg)   SpO2 94%   BMI 27.73 kg/m    Wt Readings from Last 3 Encounters:  01/22/22 187 lb 12.8 oz (85.2 kg)  12/13/21 184 lb (83.5 kg)  11/04/21 187 lb (84.8 kg)    Physical Exam Vitals and nursing note reviewed.  Constitutional:      Appearance: Normal appearance. He is not ill-appearing, toxic-appearing or diaphoretic.     Comments: Uncomfortable  HENT:     Head: Normocephalic and atraumatic.     Mouth/Throat:     Mouth: Mucous membranes are moist.     Pharynx: Oropharynx is clear.  Eyes:     Conjunctiva/sclera: Conjunctivae normal.     Pupils: Pupils are equal, round, and reactive to light.  Cardiovascular:     Rate and Rhythm: Normal rate and regular rhythm.     Heart sounds: Murmur heard.     Systolic murmur is present with a grade of 2/6.  Pulmonary:     Effort: Pulmonary effort is normal.     Comments: Guarding with breathing deep, slightly diminished breath sounds bilateral due to guarding Chest:     Chest wall: Tenderness present. No mass, lacerations, deformity, swelling, crepitus or edema. There is no dullness to percussion.  Breasts:    Breasts are symmetrical.    Abdominal:     General:  Bowel sounds are normal. There is no  distension.     Palpations: Abdomen is soft.     Tenderness: There is no abdominal tenderness.  Musculoskeletal:     Right shoulder: Tenderness present. No swelling, deformity, effusion, laceration, bony tenderness or crepitus. Normal range of motion. Normal strength. Normal pulse.     Left shoulder: Normal.     Right upper arm: Normal.     Left upper arm: Normal.     Cervical back: Normal, normal range of motion and neck supple.     Right lower leg: No edema.     Left lower leg: No edema.  Skin:    General: Skin is warm and dry.     Capillary Refill: Capillary refill takes less than 2 seconds.     Findings: Bruising present.  Neurological:     General: No focal deficit present.     Mental Status: He is alert and oriented to person, place, and time.  Psychiatric:        Mood and Affect: Mood normal.        Behavior: Behavior normal.        Thought Content: Thought content normal.        Judgment: Judgment normal.     Results for orders placed or performed in visit on 12/13/21  Novel Coronavirus, NAA ([facility])   Specimen: Nasopharyngeal(NP) swabs in vial transport medium  Result Value Ref Range   SARS-CoV-2, NAA Not Detected Not Detected  Veritor Flu A/B Waived  Result Value Ref Range   Influenza A Negative Negative   Influenza B Negative Negative     X-Ray: right shoulder: arthritic changes. No acute findings. Preliminary x-ray reading by Monia Pouch, FNP-C, [facility].  X-Ray: chest with right ribs: 4, possibly 5 fractures rib, one displaced. No pneumothorax or hemothorax . Preliminary x-ray reading by Monia Pouch, FNP-C, [facility].  Pertinent labs & imaging results that were available during my care of the patient were reviewed by me and considered in my medical decision making.  Assessment & Plan:  Gamaliel was seen today for fall.  Diagnoses and all orders for this visit:  Fall, initial encounter -     DG Shoulder Right; Future -      DG Ribs Unilateral W/Chest Right; Future  Rib pain on right side Acute pain of right shoulder Closed fracture of multiple ribs of right side, initial encounter Multiple rib fractures on right with one significantly displaced. Consulted with CT surgery and due to number of fractures and age, pt sent to ED. -     traMADol (ULTRAM) 50 MG tablet; Take 1 tablet (50 mg total) by mouth every 8 (eight) hours as needed for up to 5 days.     Continue all other maintenance medications.  Follow up plan: Return if symptoms worsen or fail to improve.   Continue healthy lifestyle choices, including diet (rich in fruits, vegetables, and lean proteins, and low in salt and simple carbohydrates) and exercise (at least 30 minutes of moderate physical activity daily).  Educational handout given for rib fractures  The above assessment and management plan was discussed with the patient. The patient verbalized understanding of and has agreed to the management plan. Patient is aware to call the clinic if they develop any new symptoms or if symptoms persist or worsen. Patient is aware when to return to the clinic for a follow-up visit. Patient educated on when it is appropriate to go to the emergency department.   Monia Pouch, FNP-C [facility] [phone]

## 2022-01-22 NOTE — ED Triage Notes (Signed)
Patient sent to ED from PCP for evaluation of possible flail segment on right chest after a fall on Thanksgiving morning. Patient was walking his dog when he felt onto a railing. Patient denies shortness of breath, is speaking in complete sentences, and is in no apparent distress at this time.

## 2022-01-22 NOTE — ED Provider Triage Note (Signed)
Emergency Medicine Provider Triage Evaluation Note  Terry Pacheco , a 71 y.o. male  was evaluated in triage.  Pt complains of mechanical fall that occurred on TG day fell on the deck w ice on and fell ribs onto a step.  No head injury. On asa no DOAC or warfarin.   R rib pain. Fractures evident on xray.   Review of Systems  Positive: Rib pain Negative: LH  Physical Exam  BP 129/78 (BP Location: Right Arm)   Pulse 69   Temp (!) 97.4 F (36.3 C) (Oral)   Resp 15   SpO2 97%  Gen:   Awake, no distress   Resp:  Normal effort  MSK:   Moves extremities without difficulty  Other:  Brusing to R ribcage.   Medical Decision Making  Medically screening exam initiated at 2:30 PM.  Appropriate orders placed.  Terry Pacheco was informed that the remainder of the evaluation will be completed by another provider, this initial triage assessment does not replace that evaluation, and the importance of remaining in the ED until their evaluation is complete.  CT chest and percocet.    Terry Pacheco, [place] 01/22/22 1435

## 2022-01-22 NOTE — ED Notes (Signed)
Pt alert, NAD, calm, interactive, resps e/u, speaking in clear complete sentences, family at [facility]. EDPA at [facility].

## 2022-01-22 NOTE — ED Notes (Signed)
EDP at [facility] 

## 2022-01-22 NOTE — ED Notes (Signed)
Dd/c'd by other RN

## 2022-01-22 NOTE — Discharge Instructions (Addendum)
You were seen today in the emergency department due to rib fractures.  You can follow-up with your primary about this, use the incentive spirometer as often as you are able at least while you are resting.  You can take the Percocet as needed for pain every 6 hours.  Return to the ED if you feel significantly short of breath, cough up blood or have new or concerning symptoms.

## 2022-01-22 NOTE — ED Provider Notes (Signed)
[facility] EMERGENCY DEPARTMENT Provider Note   CSN: [id number] Arrival date & time: 01/22/22  1314     History  Chief Complaint  Patient presents with   Rib Injury    DAO MEMMOTT is a 71 y.o. male.  HPI   Patient with medical history of hypertension, lipidemia, GAD, GERD, aortic stenosis presents to the emergency department due to fall.  Patient fell almost 6 days ago on Thanksgiving on the right side of his chest.  He did not hit his head or lose consciousness.  He was then having pain over his right ribs since then.  Denies any shortness of breath, hemoptysis, lower back pain, abdominal pain, chest pain.  Seen by his primary today, x-rays were ordered which are notable for multiple rib fractures told to go to the ED for additional evaluation.  Home Medications Prior to Admission medications   Medication Sig Start Date End Date Taking? Authorizing Provider  oxyCODONE-acetaminophen (PERCOCET/ROXICET) 5-325 MG tablet Take 1-2 tablets by mouth every 8 (eight) hours as needed for severe pain. 01/22/22  Yes Davonna Belling, MD  acetaminophen (TYLENOL) 500 MG tablet Take 1,000 mg by mouth daily as needed for moderate pain or headache.    [provider]  amLODipine (NORVASC) 10 MG tablet TAKE 1 TABLET EVERY DAY FOR FOR BLOOD PRESSURE 10/21/21   Claretta Fraise, MD  aspirin EC 81 MG tablet Take 81 mg by mouth daily.    [provider]  atorvastatin (LIPITOR) 40 MG tablet Take 1 tablet (40 mg total) by mouth daily. 10/21/21   Claretta Fraise, MD  B Complex CAPS TAKE 1 CAPSULE BY MOUTH EVERY DAY 05/14/20   Brunetta Genera, MD  calcium carbonate (TUMS - DOSED IN MG ELEMENTAL CALCIUM) 500 MG chewable tablet Chew 2 tablets by mouth daily as needed for indigestion or heartburn.    [provider]  escitalopram (LEXAPRO) 20 MG tablet Take 1 tablet (20 mg total) by mouth daily. 04/18/21   Claretta Fraise, MD  ezetimibe (ZETIA) 10 MG tablet TAKE 1  TABLET BY MOUTH EVERY DAY 01/20/22   Claretta Fraise, MD  fluticasone [facility]) 50 MCG/ACT nasal spray SPRAY 2 SPRAYS INTO EACH NOSTRIL EVERY DAY 06/23/17   Eustaquio Maize, MD  omeprazole (PRILOSEC) 20 MG capsule TAKE 1 CAPSULE BY MOUTH EVERY DAY 10/21/21   Claretta Fraise, MD  tamsulosin (FLOMAX) 0.4 MG CAPS capsule Take 2 capsules (0.8 mg total) by mouth at bedtime. For urine flow and prostate 04/18/21   Claretta Fraise, MD  traMADol (ULTRAM) 50 MG tablet Take 1 tablet (50 mg total) by mouth every 8 (eight) hours as needed for up to 5 days. 01/22/22 01/27/22  Baruch Gouty, FNP  Vitamin D, Ergocalciferol, (DRISDOL) 1.25 MG (50000 UNIT) CAPS capsule Take 1 capsule (50,000 Units total) by mouth every 7 (seven) days. 10/22/21   Claretta Fraise, MD      Allergies    Crestor [rosuvastatin]    Review of Systems   Review of Systems  Physical Exam Updated Vital Signs BP 129/78 (BP Location: Right Arm)   Pulse 69   Temp (!) 97.4 F (36.3 C) (Oral)   Resp 15   SpO2 97%  Physical Exam Vitals and nursing note reviewed. Exam conducted with a chaperone present.  Constitutional:      Appearance: Normal appearance.  HENT:     Head: Normocephalic and atraumatic.  Eyes:     General: No scleral icterus.  Right eye: No discharge.        Left eye: No discharge.     Extraocular Movements: Extraocular movements intact.     Pupils: Pupils are equal, round, and reactive to light.  Cardiovascular:     Rate and Rhythm: Normal rate and regular rhythm.     Pulses: Normal pulses.     Heart sounds: Murmur heard.     No friction rub. No gallop.  Pulmonary:     Effort: Pulmonary effort is normal. No respiratory distress.     Breath sounds: Normal breath sounds.     Comments: Lungs are clear to auscultation bilaterally.  No flail chest. Chest:    Abdominal:     General: Abdomen is flat. Bowel sounds are normal. There is no distension.     Palpations: Abdomen is soft.     Tenderness: There is no  abdominal tenderness.  Skin:    General: Skin is warm and dry.     Coloration: Skin is not jaundiced.     Findings: Bruising present.  Neurological:     Mental Status: He is alert. Mental status is at baseline.     Coordination: Coordination normal.     ED Results / Procedures / Treatments   Labs (all labs ordered are listed, but only abnormal results are displayed) Labs Reviewed - No data to display  EKG None  Radiology DG Ribs Unilateral W/Chest Right  Result Date: 01/22/2022 CLINICAL DATA:  Slipped and fell, right rib pain EXAM: RIGHT RIBS AND CHEST - 3+ VIEW COMPARISON:  02/20/2017, 06/13/2021 FINDINGS: Frontal view of the chest as well as frontal and oblique views of the right thoracic cage are obtained. Cardiac silhouette is unremarkable. Stable background emphysema and biapical scarring. Trace right pleural effusion. No airspace disease or pneumothorax. There are minimally displaced right lateral eighth through tenth rib fractures. IMPRESSION: 1. Minimally displaced right lateral eighth through tenth rib fractures. 2. Trace right pleural effusion.  No pneumothorax. 3. Stable background emphysema and biapical pleural and parenchymal scarring. Electronically Signed   By: Randa Ngo M.D.   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 RADIATION DOSE REDUCTION: This exam was performed according to the departmental dose-optimization program which includes automated exposure control, adjustment of the mA and/or kV according to patient size and/or use of iterative reconstruction technique. COMPARISON:  06/13/2021 FINDINGS: Cardiovascular: Limited unenhanced imaging of the heart and great vessels demonstrates no  evidence of cardiomegaly or pericardial effusion. Stable calcification of the aortic valve. Normal caliber of the thoracic aorta. Stable atherosclerosis of the aorta and coronary vasculature. Mediastinum/Nodes: No enlarged mediastinal or axillary lymph nodes. Thyroid gland, trachea, and esophagus demonstrate no significant findings. Lungs/Pleura: Stable biapical scarring and fibrosis most consistent with post radiation change. Stable nodular area of scarring at the left apex measuring 1.0 x 0.5 cm, reference image 21/4. Stable upper lobe predominant emphysema. No acute airspace disease. No pneumothorax. Trace right pleural effusion. Upper Abdomen: No acute upper abdominal findings. Diffuse colonic diverticulosis. Musculoskeletal: There are right posterolateral eighth through eleventh rib fractures, minimally displaced. No other acute bony abnormalities. Stable pectus carinatum deformity. Reconstructed images demonstrate no additional findings. IMPRESSION: 1. Minimally displaced right posterolateral eighth through eleventh rib fractures. 2. Trace right pleural  effusion.  No evidence of pneumothorax. 3. Stable biapical scarring and fibrosis, likely as result of prior radiation therapy. 4. Aortic Atherosclerosis (ICD10-I70.0) and Emphysema (ICD10-J43.9). Electronically Signed   By: Randa Ngo M.D.   On: 01/22/2022 15:01   DG Shoulder Right  Result Date: 01/22/2022 CLINICAL DATA:  Fall. EXAM: RIGHT SHOULDER - 2+ VIEW COMPARISON:  None Available. FINDINGS: Mild glenohumeral joint space narrowing. Mild acromioclavicular joint space narrowing and superior degenerative osteophytes. No acute fracture or dislocation. The visualized portion of the right lung is unremarkable. IMPRESSION: Mild glenohumeral and acromioclavicular osteoarthritis. Electronically Signed   By: Yvonne Kendall M.D.   On: 01/22/2022 11:54    Procedures Procedures    Medications Ordered in ED Medications  oxyCODONE-acetaminophen  (PERCOCET/ROXICET) 5-325 MG per tablet 1 tablet (1 tablet Oral Given 01/22/22 1437)    ED Course/ Medical Decision Making/ A&P                           Medical Decision Making Risk Prescription drug management.   This is a 71 year old presenting today due to right-sided chest wall pain after fall ago.  Differential includes not noted to fractures, pneumothorax, hemothorax, alternative traumatic process.    Patient is very adamant he did not hit his head, he also has no focal deficits neurologically cytosine indications for CT of his head or his neck.  His abdomen is soft nontender without any contusions or guarding, considered CT of his abdomen but also do not think indicated especially in the context of ground-level fall a week ago and he is not on thinners.  I ordered and reviewed CT chest without contrast, notable for 4 rib fractures.  On exam he has no flail chest but does have obvious rib fractures.  He is not hypoxic, he is stable to ambulate initially has an incentive spirometer he picked up earlier today.   I consulted with Dr. Kipp Brood on call with CT surgery.  Given patient is not hypoxic and this happened a week ago, appropriate for close outpatient follow-up with PCP.  Agrees with spirometer.  Appreciate his consult.  Percocet sent to the patient's pharmacy for pain control.  He is in some spirometer he will use at home, patient was able to ambulate without becoming hypoxic here in the ED.  We will have him follow-up with his PCP closely.  Return precautions were discussed with the patient who verbalized understanding.  Discussed HPI, physical exam and plan of care for this patient with attending Davonna Belling. The attending physician evaluated this patient as part of a shared visit and agrees with plan of care.         Final Clinical Impression(s) / ED Diagnoses Final diagnoses:  Closed fracture of multiple ribs of right side with routine healing, subsequent  encounter    Rx / DC Orders ED Discharge Orders          Ordered    oxyCODONE-acetaminophen (PERCOCET/ROXICET) 5-325 MG tablet  Every 8 hours PRN        01/22/22 1615              Sherrill Raring, PA-C 01/22/22 1651    Davonna Belling, MD 01/22/22 2333

## 2022-01-22 NOTE — ED Notes (Signed)
Discharge instructions reviewed with patient and family member. Patient denies any questions or concerns, expresses understanding of discharge instructions.Patient ambulatory out of ED.

## 2022-01-23 ENCOUNTER — Telehealth: Payer: Self-pay

## 2022-01-23 NOTE — Telephone Encounter (Signed)
Transition Care Management Follow-up Telephone Call Date of discharge and from where: [facility] 01/22/2022 How have you been since you were released from the hospital? better Any questions or concerns? No  Items Reviewed: Did the pt receive and understand the discharge instructions provided? Yes  Medications obtained and verified? Yes  Other? No  Any new allergies since your discharge? No  Dietary orders reviewed? Yes Do you have support at home? Yes   Home Care and Equipment/Supplies: Were home health services ordered? no If so, what is the name of the agency? N/a  Has the agency set up a time to come to the patient's home? not applicable Were any new equipment or medical supplies ordered?  No What is the name of the medical supply agency? N/a Were you able to get the supplies/equipment? not applicable Do you have any questions related to the use of the equipment or supplies? No  Functional Questionnaire: (I = Independent and D = Dependent) ADLs: i  Bathing/Dressing- i  Meal Prep- i  Eating- i  Maintaining continence- i  Transferring/Ambulation- i  Managing Meds- i  Follow up appointments reviewed:  PCP Hospital f/u appt confirmed? Yes  Scheduled to see Ronnald Collum  on 01/31/2022 @ 1115. [facility] f/u appt confirmed? No   Are transportation arrangements needed? No  If their condition worsens, is the pt aware to call PCP or go to the Emergency Dept.? Yes Was the patient provided with contact information for the PCP's office or ED? Yes Was to pt encouraged to call back with questions or concerns? Yes

## 2022-01-31 ENCOUNTER — Ambulatory Visit: Payer: Medicare Other | Admitting: Nurse Practitioner

## 2022-01-31 ENCOUNTER — Encounter: Payer: Self-pay | Admitting: Nurse Practitioner

## 2022-01-31 VITALS — BP 128/77 | HR 84 | Temp 97.8°F | Resp 20 | Ht 69.0 in | Wt 186.0 lb

## 2022-01-31 DIAGNOSIS — S2241XD Multiple fractures of ribs, right side, subsequent encounter for fracture with routine healing: Secondary | ICD-10-CM | POA: Diagnosis not present

## 2022-01-31 DIAGNOSIS — Z23 Encounter for immunization: Secondary | ICD-10-CM | POA: Diagnosis not present

## 2022-01-31 DIAGNOSIS — Z7689 Persons encountering health services in other specified circumstances: Secondary | ICD-10-CM

## 2022-01-31 MED ORDER — OXYCODONE-ACETAMINOPHEN 5-325 MG PO TABS: 1.0000 | ORAL_TABLET | Freq: Three times a day (TID) | ORAL | 0 refills | Status: DC | PRN

## 2022-01-31 NOTE — Patient Instructions (Signed)
Rib Fracture  A rib fracture is a break or crack in one of the bones of the ribs. The ribs are long, curved bones that wrap around your chest and attach to your spine and your breastbone. The ribs protect your heart, lungs, and other organs in the chest. A broken or cracked rib is often painful but is not usually serious. Most rib fractures heal on their own over time. However, rib fractures can be more serious if multiple ribs are broken or if broken ribs move out of place and push against other structures or organs. What are the causes? This condition is caused by: Repetitive movements with high force, such as pitching a baseball or having very bad coughing spells. A direct hit the chest, such as a sports injury, a car crash, or a fall. Cancer that has spread to the bones, which can weaken bones and cause them to break. What are the signs or symptoms? Symptoms of this condition include: Pain when you breathe in or cough. Pain when someone presses on the injured area. Feeling short of breath. How is this diagnosed? This condition is diagnosed with a physical exam and medical history. You may also have imaging tests, such as: Chest X-ray. CT scan. MRI. Bone scan. Chest ultrasound. How is this treated? Treatment for this condition depends on the severity of the fracture. Most rib fractures usually heal on their own in 1-3 months. Healing may take longer if you have a cough or if you do activities that make the injury worse. While you heal, you may be given medicines to control the pain. You will also be taught deep breathing exercises. Severe injuries may require hospitalization or surgery. Follow these instructions at home: Managing pain, stiffness, and swelling If directed, put ice on the injured area. To do this: Put ice in a plastic bag. Place a towel between your skin and the bag. Leave the ice on for 20 minutes, 2-3 times a day. Remove the ice if your skin turns bright red. This is  very important. If you cannot feel pain, heat, or cold, you have a greater risk of damage to the area. Take over-the-counter and prescription medicines only as told by your health care provider. Activity Avoid doing activities or movements that cause pain. Be careful during activities and avoid bumping the injured rib. Slowly increase your activity as told by your health care provider. General instructions Do deep breathing exercises as told by your health care provider. This helps prevent pneumonia, which is a common complication of a broken rib. Your health care provider may instruct you to: Take deep breaths several times a day. Try to cough several times a day, holding a pillow against the injured area. Use a device called incentive spirometer to practice deep breathing several times a day. Drink enough fluid to keep your urine pale yellow. Do not wear a rib belt or binder. These restrict breathing, which can lead to pneumonia. Keep all follow-up visits. This is important. Contact a health care provider if: You have a fever. Get help right away if: You have difficulty breathing or you are short of breath. You develop a cough that does not stop, or you cough up thick or bloody sputum. You have nausea, vomiting, or pain in your abdomen. Your pain gets worse and medicine does not help. These symptoms may represent a serious problem that is an emergency. Do not wait to see if the symptoms will go away. Get medical help right away. Call   your local emergency services (911 in the [place] Do not drive yourself to the hospital. Summary A rib fracture is a break or crack in one of the bones of the ribs. A broken or cracked rib is often painful but is not usually serious. Most rib fractures heal on their own over time. Treatment for this condition depends on the severity of the fracture. Avoid doing any activities or movements that cause pain. This information is not intended to replace advice  given to you by your health care provider. Make sure you discuss any questions you have with your health care provider. Document Revised: 06/03/2019 Document Reviewed: 06/03/2019 Elsevier Patient Education  2023 [employer]  

## 2022-01-31 NOTE — Addendum Note (Signed)
Addended by: Chevis Pretty on: 01/31/2022 09:00 AM   Modules accepted: Level of Service

## 2022-01-31 NOTE — Progress Notes (Signed)
   Subjective:    Patient ID: Terry Pacheco, male    DOB: [birth year], 71 y.o.   MRN: [id number]   Chief Complaint: hospital follow up  Today's visit was for Transitional Care Management.  The patient was discharged from [facility] on 01/22/22 with a primary diagnosis of rib fracture.   Contact with the patient and/or caregiver, by a clinical staff member, was made on 01/23/22 and was documented as a telephone encounter within the EMR.  Through chart review and discussion with the patient I have determined that management of their condition is of low complexity.   Patient actually fell on Thanksgiving day and hit some steps he was not seen until 01/22/22. Chest xray showed multiple rib fractures, right 8-10th rib. They were mildly displaced and patient was sent to the ED for CT scan which showed mildly displaced rib fractures on right. He was given ultram for pain. He is doing much better today. Rates pan 5/10 currently.      Review of Systems  Constitutional:  Negative for diaphoresis.  Eyes:  Negative for pain.  Respiratory:  Negative for shortness of breath.   Cardiovascular:  Negative for chest pain, palpitations and leg swelling.  Gastrointestinal:  Negative for abdominal pain.  Endocrine: Negative for polydipsia.  Skin:  Negative for rash.  Neurological:  Negative for dizziness, weakness and headaches.  Hematological:  Does not bruise/bleed easily.  All other systems reviewed and are negative.      Objective:   Physical Exam Constitutional:      Appearance: Normal appearance.  Cardiovascular:     Rate and Rhythm: Normal rate and regular rhythm.     Heart sounds: Normal heart sounds.  Pulmonary:     Effort: Pulmonary effort is normal.     Breath sounds: Normal breath sounds.  Skin:    Comments: Contusion on right flank around 8th rib  Neurological:     Mental Status: He is alert.     BP 128/77   Pulse 84   Temp 97.8 F (36.6 C) (Temporal)   Resp 20   Ht 5'  9" (1.753 m)   Wt 186 lb (84.4 kg)   SpO2 96%   BMI 27.47 kg/m        Assessment & Plan:   [name] in today with chief complaint of Hospitalization Follow-up and Transitions Of Care   1. Encounter for support and coordination of transition of care Hospital records reviewed  2. Closed fracture of multiple ribs of right side with routine healing, subsequent encounter Splint area and cough and deep breathe every 2 hours. Meds ordered this encounter  Medications   oxyCODONE-acetaminophen (PERCOCET/ROXICET) 5-325 MG tablet    Sig: Take 1-2 tablets by mouth every 8 (eight) hours as needed for severe pain.    Dispense:  10 tablet    Refill:  0    Order Specific Question:   Supervising Provider    Answer:   Caryl Pina A [[id number]]       The above assessment and management plan was discussed with the patient. The patient verbalized understanding of and has agreed to the management plan. Patient is aware to call the clinic if symptoms persist or worsen. Patient is aware when to return to the clinic for a follow-up visit. Patient educated on when it is appropriate to go to the emergency department.   Mary-Margaret Hassell Done, FNP

## 2022-04-16 ENCOUNTER — Other Ambulatory Visit: Payer: Self-pay | Admitting: Family Medicine

## 2022-04-22 ENCOUNTER — Ambulatory Visit (INDEPENDENT_AMBULATORY_CARE_PROVIDER_SITE_OTHER): Payer: Medicare Other | Admitting: Family Medicine

## 2022-04-22 ENCOUNTER — Ambulatory Visit (INDEPENDENT_AMBULATORY_CARE_PROVIDER_SITE_OTHER): Payer: Medicare Other

## 2022-04-22 ENCOUNTER — Encounter: Payer: Self-pay | Admitting: Family Medicine

## 2022-04-22 VITALS — BP 125/66 | HR 73 | Temp 97.7°F | Ht 69.0 in | Wt 183.6 lb

## 2022-04-22 DIAGNOSIS — S2241XD Multiple fractures of ribs, right side, subsequent encounter for fracture with routine healing: Secondary | ICD-10-CM

## 2022-04-22 DIAGNOSIS — I10 Essential (primary) hypertension: Secondary | ICD-10-CM | POA: Diagnosis not present

## 2022-04-22 DIAGNOSIS — F411 Generalized anxiety disorder: Secondary | ICD-10-CM | POA: Diagnosis not present

## 2022-04-22 DIAGNOSIS — N401 Enlarged prostate with lower urinary tract symptoms: Secondary | ICD-10-CM

## 2022-04-22 DIAGNOSIS — R350 Frequency of micturition: Secondary | ICD-10-CM

## 2022-04-22 DIAGNOSIS — E785 Hyperlipidemia, unspecified: Secondary | ICD-10-CM | POA: Diagnosis not present

## 2022-04-22 LAB — LIPID PANEL

## 2022-04-22 MED ORDER — TAMSULOSIN HCL 0.4 MG PO CAPS: 0.8000 mg | ORAL_CAPSULE | Freq: Every day | ORAL | 3 refills | Status: DC

## 2022-04-22 MED ORDER — EZETIMIBE 10 MG PO TABS: 10.0000 mg | ORAL_TABLET | Freq: Every day | ORAL | 2 refills | Status: DC

## 2022-04-22 MED ORDER — ESCITALOPRAM OXALATE 20 MG PO TABS: 20.0000 mg | ORAL_TABLET | Freq: Every day | ORAL | 3 refills | Status: DC

## 2022-04-22 NOTE — Progress Notes (Signed)
Subjective:  Patient ID: Terry Pacheco, male    DOB: Jul 21, 1950  Age: 72 y.o. MRN: [id number]  CC: Medical Management of Chronic Issues   HPI Terry Pacheco presents for follow-up of hypertension. Patient has no history of headache chest pain or shortness of breath or recent cough. Patient also denies symptoms of TIA such as numbness weakness lateralizing. Patient checks  blood pressure at home and has not had any elevated readings recently. Patient denies side effects from his medication. States taking it regularly.  Patient in for follow-up of elevated cholesterol. Doing well without complaints on current medication. Denies side effects of statin including myalgia and arthralgia and nausea. Also in today for liver function testing. Currently no chest pain, shortness of breath or other cardiovascular related symptoms noted.  Patient in for follow-up of GERD. Currently asymptomatic taking  PPI daily. There is no chest pain or heartburn. No hematemesis and no melena. No dysphagia or choking. Onset is remote. Progression is stable. Complicating factors, none.  Patient fell on Thanksgiving day that would be in November 23.  He fractured 4 ribs.  There is still tender.  However, the pain has lessened significantly over time.  He is not short of breath.  He has not had any hemoptysis.  The tenderness is over the site of the fractures at the right lateral ribs under the axilla to the costal margin.  Follow-up prostate follow-up BPH patient states medication has been working well but he still has to go to the bathroom somewhat frequently and sometimes during the night.  No side effects noted.       04/22/2022    8:21 AM 01/31/2022    8:49 AM 12/13/2021    8:42 AM  Depression screen PHQ 2/9  Decreased Interest 0 0 0  Down, Depressed, Hopeless 0 0 0  PHQ - 2 Score 0 0 0  Altered sleeping  0 0  Tired, decreased energy  0 0  Change in appetite  0 0  Feeling bad or failure about yourself   0 0   Trouble concentrating  0 0  Moving slowly or fidgety/restless  0 0  Suicidal thoughts  0 0  PHQ-9 Score  0 0  Difficult doing work/chores  Not difficult at all Not difficult at all    History Terry Pacheco has a past medical history of Aortic stenosis, Arthritis, Cancer ([place]) (10/2016), GERD (gastroesophageal reflux disease) (07/06/2014), History of radiation therapy (12/30/2016- 02/18/2017), Hypertension, Sleep apnea, and Wears glasses.   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   His family history includes Heart disease in his mother; Stroke in his father.He reports that he quit smoking about 5 years ago. His smoking use included cigarettes. He started smoking about 39 years ago. He has a 22.50 pack-year smoking history. He has never used smokeless tobacco. He reports that he does not currently use alcohol. He reports that he does not currently use drugs after having used the following drugs: Marijuana.    ROS Review of Systems  Constitutional: Negative.   HENT: Negative.    Eyes:  Negative for visual disturbance.  Respiratory:  Negative for cough and shortness of breath.   Cardiovascular:  Negative for chest pain and leg swelling.  Gastrointestinal:  Negative for abdominal pain, diarrhea, nausea and vomiting.  Genitourinary:  Negative for difficulty urinating.  Musculoskeletal:  Negative for arthralgias and myalgias.  Skin:  Negative for rash.  Neurological:  Negative for headaches.  Psychiatric/Behavioral:  Negative for sleep disturbance.     Objective:   BP 125/66   Pulse 73   Temp 97.7 F (36.5 C)   Ht '5\' 9"'$  (1.753 m)   Wt 183 lb 9.6 oz (83.3 kg)   SpO2 96%   BMI 27.11 kg/m   BP Readings from Last 3 Encounters:  04/22/22 125/66  01/31/22 128/77  01/22/22 131/80    Wt Readings from Last 3 Encounters:  04/22/22 183 lb 9.6 oz (83.3 kg)  01/31/22 186 lb (84.4 kg)  01/22/22 187 lb 12.8 oz (85.2 kg)     Physical Exam Vitals reviewed.  Constitutional:      General: He is not in acute distress.    Appearance: He is well-developed.  HENT:     Head: Normocephalic and atraumatic.     Right Ear: External ear normal.     Left Ear: External ear normal.     Nose: Nose normal.     Mouth/Throat:     Pharynx: No oropharyngeal exudate or posterior oropharyngeal erythema.  Eyes:     Conjunctiva/sclera: Conjunctivae normal.     Pupils: Pupils are equal, round, and reactive to light.  Cardiovascular:     Rate and Rhythm: Normal rate and regular rhythm.     Heart sounds: Normal heart sounds. No murmur heard. Pulmonary:     Effort: Pulmonary effort is normal. No respiratory distress.     Breath sounds: Normal breath sounds. No wheezing or rales.  Abdominal:     Palpations: Abdomen is soft.     Tenderness: There is no abdominal tenderness.  Musculoskeletal:        General: Normal range of motion.     Cervical back: Normal range of motion and neck supple.  Skin:    General: Skin is warm and dry.  Neurological:     Mental Status: He is alert and oriented to person, place, and time.     Deep Tendon Reflexes: Reflexes are normal and symmetric.  Psychiatric:        Behavior: Behavior normal.        Thought Content: Thought content normal.        Judgment: Judgment normal.       Assessment & Plan:   Terry Pacheco was seen today for medical management of chronic issues.  Diagnoses and all orders for this visit:  Primary hypertension -     CBC with Differential/Platelet -     CMP14+EGFR  Hyperlipidemia with target LDL less than  100 -     Lipid panel  GAD (generalized anxiety disorder) -     escitalopram (LEXAPRO) 20 MG tablet; Take 1 tablet (20 mg total) by mouth daily.  Benign prostatic hyperplasia with urinary frequency -     tamsulosin (FLOMAX) 0.4 MG CAPS capsule; Take 2 capsules (0.8 mg total) by mouth at bedtime. For urine flow and prostate  Closed fracture of multiple ribs of right side with routine healing, subsequent encounter -     DG Ribs Unilateral W/Chest Right; Future  Other orders -     ezetimibe (ZETIA) 10 MG tablet; Take 1 tablet (10 mg total) by mouth  daily.       I have discontinued Elyn Aquas. Schellenberg's omeprazole. I have also changed his ezetimibe. Additionally, I am having him maintain his fluticasone, aspirin EC, acetaminophen, calcium carbonate, B Complex, amLODipine, atorvastatin, Vitamin D (Ergocalciferol), oxyCODONE-acetaminophen, escitalopram, and tamsulosin.  Allergies as of 04/22/2022       Reactions   Crestor [rosuvastatin]    Muscle aches        Medication List        Accurate as of April 22, 2022 11:03 AM. If you have any questions, ask your nurse or doctor.          STOP taking these medications    omeprazole 20 MG capsule Commonly known as: PRILOSEC Stopped by: Claretta Fraise, MD       TAKE these medications    acetaminophen 500 MG tablet Commonly known as: TYLENOL Take 1,000 mg by mouth daily as needed for moderate pain or headache.   amLODipine 10 MG tablet Commonly known as: NORVASC TAKE 1 TABLET EVERY DAY FOR FOR BLOOD PRESSURE   aspirin EC 81 MG tablet Take 81 mg by mouth daily.   atorvastatin 40 MG tablet Commonly known as: LIPITOR Take 1 tablet (40 mg total) by mouth daily.   B Complex Caps TAKE 1 CAPSULE BY MOUTH EVERY DAY   calcium carbonate 500 MG chewable tablet Commonly known as: TUMS - dosed in mg elemental calcium Chew 2 tablets by mouth daily as needed for indigestion or heartburn.   escitalopram 20 MG tablet Commonly  known as: LEXAPRO Take 1 tablet (20 mg total) by mouth daily.   ezetimibe 10 MG tablet Commonly known as: ZETIA Take 1 tablet (10 mg total) by mouth daily.   fluticasone 50 MCG/ACT nasal spray Commonly known as: FLONASE SPRAY 2 SPRAYS INTO EACH NOSTRIL EVERY DAY   oxyCODONE-acetaminophen 5-325 MG tablet Commonly known as: PERCOCET/ROXICET Take 1-2 tablets by mouth every 8 (eight) hours as needed for severe pain.   tamsulosin 0.4 MG Caps capsule Commonly known as: FLOMAX Take 2 capsules (0.8 mg total) by mouth at bedtime. For urine flow and prostate   Vitamin D (Ergocalciferol) 1.25 MG (50000 UNIT) Caps capsule Commonly known as: DRISDOL Take 1 capsule (50,000 Units total) by mouth every 7 (seven) days.         Follow-up: Return in about 6 months (around 10/21/2022).  Claretta Fraise, M.D.

## 2022-04-23 LAB — CBC WITH DIFFERENTIAL/PLATELET
Basophils Absolute: 0 10*3/uL (ref 0.0–0.2)
Basos: 0 %
EOS (ABSOLUTE): 0.1 10*3/uL (ref 0.0–0.4)
Eos: 2 %
Hematocrit: 43.6 % (ref 37.5–51.0)
Hemoglobin: 15 g/dL (ref 13.0–17.7)
Immature Grans (Abs): 0 10*3/uL (ref 0.0–0.1)
Immature Granulocytes: 0 %
Lymphocytes Absolute: 0.9 10*3/uL (ref 0.7–3.1)
Lymphs: 17 %
MCH: 31.5 pg (ref 26.6–33.0)
MCHC: 34.4 g/dL (ref 31.5–35.7)
MCV: 92 fL (ref 79–97)
Monocytes Absolute: 0.4 10*3/uL (ref 0.1–0.9)
Monocytes: 8 %
Neutrophils Absolute: 4 10*3/uL (ref 1.4–7.0)
Neutrophils: 73 %
Platelets: 139 10*3/uL — ABNORMAL LOW (ref 150–450)
RBC: 4.76 x10E6/uL (ref 4.14–5.80)
RDW: 13.1 % (ref 11.6–15.4)
WBC: 5.5 10*3/uL (ref 3.4–10.8)

## 2022-04-23 LAB — LIPID PANEL
Chol/HDL Ratio: 2.1 ratio (ref 0.0–5.0)
Cholesterol, Total: 119 mg/dL (ref 100–199)
HDL: 58 mg/dL (ref >39)
LDL Chol Calc (NIH): 49 mg/dL (ref 0–99)
Triglycerides: 51 mg/dL (ref 0–149)
VLDL Cholesterol Cal: 12 mg/dL (ref 5–40)

## 2022-04-23 LAB — CMP14+EGFR
ALT: 19 IU/L (ref 0–44)
AST: 15 IU/L (ref 0–40)
Albumin/Globulin Ratio: 2.1 (ref 1.2–2.2)
Albumin: 4.4 g/dL (ref 3.8–4.8)
Alkaline Phosphatase: 108 IU/L (ref 44–121)
BUN/Creatinine Ratio: 17 (ref 10–24)
BUN: 16 mg/dL (ref 8–27)
Bilirubin Total: 0.5 mg/dL (ref 0.0–1.2)
CO2: 21 mmol/L (ref 20–29)
Calcium: 9.2 mg/dL (ref 8.6–10.2)
Chloride: 102 mmol/L (ref 96–106)
Creatinine, Ser: 0.95 mg/dL (ref 0.76–1.27)
Globulin, Total: 2.1 g/dL (ref 1.5–4.5)
Glucose: 107 mg/dL — ABNORMAL HIGH (ref 70–99)
Potassium: 4.2 mmol/L (ref 3.5–5.2)
Sodium: 138 mmol/L (ref 134–144)
Total Protein: 6.5 g/dL (ref 6.0–8.5)
eGFR: 86 mL/min/{1.73_m2} (ref >59)

## 2022-04-23 NOTE — Progress Notes (Signed)
Hello Terry Pacheco,  Your lab result is normal and/or stable.Some minor variations that are not significant are commonly marked abnormal, but do not represent any medical problem for you.  Best regards, Claretta Fraise, M.D.

## 2022-07-23 DIAGNOSIS — M9903 Segmental and somatic dysfunction of lumbar region: Secondary | ICD-10-CM | POA: Diagnosis not present

## 2022-07-23 DIAGNOSIS — M9901 Segmental and somatic dysfunction of cervical region: Secondary | ICD-10-CM | POA: Diagnosis not present

## 2022-07-23 DIAGNOSIS — M47812 Spondylosis without myelopathy or radiculopathy, cervical region: Secondary | ICD-10-CM | POA: Diagnosis not present

## 2022-07-23 DIAGNOSIS — M9902 Segmental and somatic dysfunction of thoracic region: Secondary | ICD-10-CM | POA: Diagnosis not present

## 2022-07-23 DIAGNOSIS — M4004 Postural kyphosis, thoracic region: Secondary | ICD-10-CM | POA: Diagnosis not present

## 2022-07-23 DIAGNOSIS — M47816 Spondylosis without myelopathy or radiculopathy, lumbar region: Secondary | ICD-10-CM | POA: Diagnosis not present

## 2022-07-25 DIAGNOSIS — M9903 Segmental and somatic dysfunction of lumbar region: Secondary | ICD-10-CM | POA: Diagnosis not present

## 2022-07-25 DIAGNOSIS — M4004 Postural kyphosis, thoracic region: Secondary | ICD-10-CM | POA: Diagnosis not present

## 2022-07-25 DIAGNOSIS — M9901 Segmental and somatic dysfunction of cervical region: Secondary | ICD-10-CM | POA: Diagnosis not present

## 2022-07-25 DIAGNOSIS — M47816 Spondylosis without myelopathy or radiculopathy, lumbar region: Secondary | ICD-10-CM | POA: Diagnosis not present

## 2022-07-25 DIAGNOSIS — M9902 Segmental and somatic dysfunction of thoracic region: Secondary | ICD-10-CM | POA: Diagnosis not present

## 2022-07-25 DIAGNOSIS — M47812 Spondylosis without myelopathy or radiculopathy, cervical region: Secondary | ICD-10-CM | POA: Diagnosis not present

## 2022-07-31 DIAGNOSIS — M9901 Segmental and somatic dysfunction of cervical region: Secondary | ICD-10-CM | POA: Diagnosis not present

## 2022-07-31 DIAGNOSIS — M47816 Spondylosis without myelopathy or radiculopathy, lumbar region: Secondary | ICD-10-CM | POA: Diagnosis not present

## 2022-07-31 DIAGNOSIS — M9902 Segmental and somatic dysfunction of thoracic region: Secondary | ICD-10-CM | POA: Diagnosis not present

## 2022-07-31 DIAGNOSIS — M4004 Postural kyphosis, thoracic region: Secondary | ICD-10-CM | POA: Diagnosis not present

## 2022-07-31 DIAGNOSIS — M47812 Spondylosis without myelopathy or radiculopathy, cervical region: Secondary | ICD-10-CM | POA: Diagnosis not present

## 2022-07-31 DIAGNOSIS — M9903 Segmental and somatic dysfunction of lumbar region: Secondary | ICD-10-CM | POA: Diagnosis not present

## 2022-08-04 ENCOUNTER — Ambulatory Visit (INDEPENDENT_AMBULATORY_CARE_PROVIDER_SITE_OTHER): Payer: HMO

## 2022-08-04 VITALS — Ht 69.0 in | Wt 178.0 lb

## 2022-08-04 DIAGNOSIS — Z0000 Encounter for general adult medical examination without abnormal findings: Secondary | ICD-10-CM | POA: Diagnosis not present

## 2022-08-04 NOTE — Patient Instructions (Signed)
[name] , Thank you for taking time to come for your Medicare Wellness Visit. I appreciate your ongoing commitment to your health goals. Please review the following plan we discussed and let me know if I can assist you in the future.   These are the goals we discussed:  Goals      Exercise 3x per week (30 min per time)     Weight < 175 lb (79.379 kg)        This is a list of the screening recommended for you and due dates:  Health Maintenance  Topic Date Due   COVID-19 Vaccine (4 - 2023-24 season) 10/25/2021   Stool Blood Test  02/20/2023*   Flu Shot  09/25/2022   Screening for Lung Cancer  01/23/2023   Medicare Annual Wellness Visit  08/04/2023   Colon Cancer Screening  12/16/2023   DTaP/Tdap/Td vaccine (2 - Td or Tdap) 10/14/2027   Pneumonia Vaccine  Completed   Hepatitis C Screening  Completed   Zoster (Shingles) Vaccine  Completed   HPV Vaccine  Aged Out  *Topic was postponed. The date shown is not the original due date.    Advanced directives: Please bring a copy of your health care power of attorney and living will to the office to be added to your chart at your convenience.   Conditions/risks identified: Aim for 30 minutes of exercise or brisk walking, 6-8 glasses of water, and 5 servings of fruits and vegetables each day.   Next appointment: Follow up in one year for your annual wellness visit.   Preventive Care 72 Years and Older, Male  Preventive care refers to lifestyle choices and visits with your health care provider that can promote health and wellness. What does preventive care include? A yearly physical exam. This is also called an annual well check. Dental exams once or twice a year. Routine eye exams. Ask your health care provider how often you should have your eyes checked. Personal lifestyle choices, including: Daily care of your teeth and gums. Regular physical activity. Eating a healthy diet. Avoiding tobacco and drug use. Limiting alcohol  use. Practicing safe sex. Taking low doses of aspirin every day. Taking vitamin and mineral supplements as recommended by your health care provider. What happens during an annual well check? The services and screenings done by your health care provider during your annual well check will depend on your age, overall health, lifestyle risk factors, and family history of disease. Counseling  Your health care provider may ask you questions about your: Alcohol use. Tobacco use. Drug use. Emotional well-being. Home and relationship well-being. Sexual activity. Eating habits. History of falls. Memory and ability to understand (cognition). Work and work Astronomer. Screening  You may have the following tests or measurements: Height, weight, and BMI. Blood pressure. Lipid and cholesterol levels. These may be checked every 5 years, or more frequently if you are over 37 years old. Skin check. Lung cancer screening. You may have this screening every year starting at age 94 if you have a 30-pack-year history of smoking and currently smoke or have quit within the past 15 years. Fecal occult blood test (FOBT) of the stool. You may have this test every year starting at age 50. Flexible sigmoidoscopy or colonoscopy. You may have a sigmoidoscopy every 5 years or a colonoscopy every 10 years starting at age 50. Prostate cancer screening. Recommendations will vary depending on your family history and other risks. Hepatitis C blood test. Hepatitis B blood test.  Sexually transmitted disease (STD) testing. Diabetes screening. This is done by checking your blood sugar (glucose) after you have not eaten for a while (fasting). You may have this done every 1-3 years. Abdominal aortic aneurysm (AAA) screening. You may need this if you are a current or former smoker. Osteoporosis. You may be screened starting at age 64 if you are at high risk. Talk with your health care provider about your test results,  treatment options, and if necessary, the need for more tests. Vaccines  Your health care provider may recommend certain vaccines, such as: Influenza vaccine. This is recommended every year. Tetanus, diphtheria, and acellular pertussis (Tdap, Td) vaccine. You may need a Td booster every 10 years. Zoster vaccine. You may need this after age 56. Pneumococcal 13-valent conjugate (PCV13) vaccine. One dose is recommended after age 28. Pneumococcal polysaccharide (PPSV23) vaccine. One dose is recommended after age 31. Talk to your health care provider about which screenings and vaccines you need and how often you need them. This information is not intended to replace advice given to you by your health care provider. Make sure you discuss any questions you have with your health care provider. Document Released: 03/09/2015 Document Revised: 10/31/2015 Document Reviewed: 12/12/2014 Elsevier Interactive Patient Education  2017 Comfort Prevention in the Home Falls can cause injuries. They can happen to people of all ages. There are many things you can do to make your home safe and to help prevent falls. What can I do on the outside of my home? Regularly fix the edges of walkways and driveways and fix any cracks. Remove anything that might make you trip as you walk through a door, such as a raised step or threshold. Trim any bushes or trees on the path to your home. Use bright outdoor lighting. Clear any walking paths of anything that might make someone trip, such as rocks or tools. Regularly check to see if handrails are loose or broken. Make sure that both sides of any steps have handrails. Any raised decks and porches should have guardrails on the edges. Have any leaves, snow, or ice cleared regularly. Use sand or salt on walking paths during winter. Clean up any spills in your garage right away. This includes oil or grease spills. What can I do in the bathroom? Use night  lights. Install grab bars by the toilet and in the tub and shower. Do not use towel bars as grab bars. Use non-skid mats or decals in the tub or shower. If you need to sit down in the shower, use a plastic, non-slip stool. Keep the floor dry. Clean up any water that spills on the floor as soon as it happens. Remove soap buildup in the tub or shower regularly. Attach bath mats securely with double-sided non-slip rug tape. Do not have throw rugs and other things on the floor that can make you trip. What can I do in the bedroom? Use night lights. Make sure that you have a light by your bed that is easy to reach. Do not use any sheets or blankets that are too big for your bed. They should not hang down onto the floor. Have a firm chair that has side arms. You can use this for support while you get dressed. Do not have throw rugs and other things on the floor that can make you trip. What can I do in the kitchen? Clean up any spills right away. Avoid walking on wet floors. Keep items that you  use a lot in easy-to-reach places. If you need to reach something above you, use a strong step stool that has a grab bar. Keep electrical cords out of the way. Do not use floor polish or wax that makes floors slippery. If you must use wax, use non-skid floor wax. Do not have throw rugs and other things on the floor that can make you trip. What can I do with my stairs? Do not leave any items on the stairs. Make sure that there are handrails on both sides of the stairs and use them. Fix handrails that are broken or loose. Make sure that handrails are as long as the stairways. Check any carpeting to make sure that it is firmly attached to the stairs. Fix any carpet that is loose or worn. Avoid having throw rugs at the top or bottom of the stairs. If you do have throw rugs, attach them to the floor with carpet tape. Make sure that you have a light switch at the top of the stairs and the bottom of the stairs. If  you do not have them, ask someone to add them for you. What else can I do to help prevent falls? Wear shoes that: Do not have high heels. Have rubber bottoms. Are comfortable and fit you well. Are closed at the toe. Do not wear sandals. If you use a stepladder: Make sure that it is fully opened. Do not climb a closed stepladder. Make sure that both sides of the stepladder are locked into place. Ask someone to hold it for you, if possible. Clearly mark and make sure that you can see: Any grab bars or handrails. First and last steps. Where the edge of each step is. Use tools that help you move around (mobility aids) if they are needed. These include: Canes. Walkers. Scooters. Crutches. Turn on the lights when you go into a dark area. Replace any light bulbs as soon as they burn out. Set up your furniture so you have a clear path. Avoid moving your furniture around. If any of your floors are uneven, fix them. If there are any pets around you, be aware of where they are. Review your medicines with your doctor. Some medicines can make you feel dizzy. This can increase your chance of falling. Ask your doctor what other things that you can do to help prevent falls. This information is not intended to replace advice given to you by your health care provider. Make sure you discuss any questions you have with your health care provider. Document Released: 12/07/2008 Document Revised: 07/19/2015 Document Reviewed: 03/17/2014 Elsevier Interactive Patient Education  2017 [employer].

## 2022-08-04 NOTE — Progress Notes (Signed)
Subjective:   Terry Pacheco is a 72 y.o. male who presents for Medicare Annual/Subsequent preventive examination. I connected with  Terry Pacheco on 08/04/22 by a audio enabled telemedicine application and verified that I am speaking with the correct person using two identifiers.  Patient Location: Home  Provider Location: Home Office  I discussed the limitations of evaluation and management by telemedicine. The patient expressed understanding and agreed to proceed.  Review of Systems     Cardiac Risk Factors include: advanced age (>49men, >57 women);dyslipidemia;hypertension;male gender     Objective:    Today's Vitals   08/04/22 1121  Weight: 178 lb (80.7 kg)  Height: 5\' 9"  (1.753 m)   Body mass index is 26.29 kg/m.     08/04/2022   11:25 AM 07/31/2021   11:59 AM 07/11/2019   12:32 PM 12/07/2017    7:45 AM 09/30/2017    2:42 PM 09/18/2017   10:15 AM 06/30/2017    1:27 PM  Advanced Directives  Does Patient Have a Medical Advance Directive? Yes No No No No No No  Type of Estate agent of [place];Living will        Copy of Healthcare Power of Attorney in Chart? No - copy requested        Would patient like information on creating a medical advance directive?  No - Patient declined No - Patient declined No - Patient declined No - Patient declined No - Patient declined No - Patient declined    Current Medications (verified) Outpatient Encounter Medications as of 08/04/2022  Medication Sig   acetaminophen (TYLENOL) 500 MG tablet Take 1,000 mg by mouth daily as needed for moderate pain or headache.   amLODipine (NORVASC) 10 MG tablet TAKE 1 TABLET EVERY DAY FOR FOR BLOOD PRESSURE   aspirin EC 81 MG tablet Take 81 mg by mouth daily.   atorvastatin (LIPITOR) 40 MG tablet Take 1 tablet (40 mg total) by mouth daily.   B Complex CAPS TAKE 1 CAPSULE BY MOUTH EVERY DAY   calcium carbonate (TUMS - DOSED IN MG ELEMENTAL CALCIUM) 500 MG chewable tablet Chew 2 tablets  by mouth daily as needed for indigestion or heartburn.   escitalopram (LEXAPRO) 20 MG tablet Take 1 tablet (20 mg total) by mouth daily.   ezetimibe (ZETIA) 10 MG tablet Take 1 tablet (10 mg total) by mouth daily.   fluticasone (FLONASE) 50 MCG/ACT nasal spray SPRAY 2 SPRAYS INTO EACH NOSTRIL EVERY DAY   tamsulosin (FLOMAX) 0.4 MG CAPS capsule Take 2 capsules (0.8 mg total) by mouth at bedtime. For urine flow and prostate   Vitamin D, Ergocalciferol, (DRISDOL) 1.25 MG (50000 UNIT) CAPS capsule Take 1 capsule (50,000 Units total) by mouth every 7 (seven) days.   oxyCODONE-acetaminophen (PERCOCET/ROXICET) 5-325 MG tablet Take 1-2 tablets by mouth every 8 (eight) hours as needed for severe pain. (Patient not taking: Reported on 08/04/2022)   No facility-administered encounter medications on file as of 08/04/2022.    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    Hypertension    Sleep apnea    does not use CPAP   Wears glasses    Past Surgical History:  Procedure Laterality Date   HERNIA REPAIR     right inguinal hernia   IR CV LINE INJECTION  01/14/2017   IR FLUORO GUIDE PORT INSERTION RIGHT  12/25/2016   IR GASTROSTOMY TUBE MOD SED  12/25/2016   IR GASTROSTOMY TUBE REMOVAL  06/05/2017   IR REMOVAL TUN ACCESS W/ PORT W/O FL MOD SED  03/06/2017   IR US GUIDE VASC ACCESS RIGHT  12/25/2016   LEFT HEART CATH AND CORONARY ANGIOGRAPHY N/A 12/07/2017   Procedure: LEFT HEART CATH  AND CORONARY ANGIOGRAPHY;  Surgeon: Lyn Records, MD;  Location: [facility];  Service: Cardiovascular;  Laterality: N/A;   polyp removal  2008   during colonoscopy   TOTAL HIP ARTHROPLASTY Right 10/16/2015   Procedure: RIGHT TOTAL HIP ARTHROPLASTY ANTERIOR APPROACH;  Surgeon: Durene Romans, MD;  Location: [facility];  Service: Orthopedics;  Laterality: Right;   vocal cord polyp      removed 2-3 years ago    Family History  Problem Relation Age of Onset   Heart disease Mother    Stroke Father    Colon cancer Neg Hx    Esophageal cancer Neg Hx    Pancreatic cancer Neg Hx    Stomach cancer Neg Hx    Liver cancer Neg Hx    Social History   Socioeconomic History   Marital status: Married    Spouse name: Not on file   Number of children: 1   Years of education: Not on file   Highest education level: Not on file  Occupational History   Occupation: Retired     Associate Professor: [employer]  Tobacco Use   Smoking status: Former    Packs/day: 0.50    Years: 45.00    Additional pack years: 0.00    Total pack years: 22.50    Types: Cigarettes    Start date: 01/12/1983    Quit date: 11/17/2016    Years since quitting: 5.7   Smokeless tobacco: Never  Vaping Use   Vaping Use: Never used  Substance and Sexual Activity   Alcohol use: Not Currently    Alcohol/week: 0.0 standard drinks of alcohol   Drug use: Not Currently    Types: Marijuana    Comment: last use 10/09/2015   Sexual activity: Not on file  Other Topics Concern   Not on file  Social History Narrative   Not on file   Social Determinants of Health   Financial Resource Strain: Low Risk  (08/04/2022)   Overall Financial Resource Strain (CARDIA)    Difficulty of Paying Living Expenses: Not hard at all  Food Insecurity: No Food Insecurity (08/04/2022)   Hunger Vital Sign    Worried About Running Out of Food in the Last Year: Never true    Ran Out of Food in the Last Year: Never true  Transportation Needs: No  Transportation Needs (08/04/2022)   PRAPARE - Administrator, Civil Service (Medical): No    Lack of Transportation (Non-Medical): No  Physical Activity: Insufficiently Active (08/04/2022)   Exercise Vital Sign    Days of Exercise per Week: 3 days    Minutes of Exercise per Session: 30 min  Stress: No Stress Concern Present (08/04/2022)   [employer]  of Occupational Health - Occupational Stress Questionnaire    Feeling of Stress : Not at all  Social Connections: Moderately Integrated (08/04/2022)   Social Connection and Isolation Panel [NHANES]    Frequency of Communication with Friends and Family: Twice a week    Frequency of Social Gatherings with Friends and Family: Twice a week    Attends Religious Services: More than 4 times per year    Active Member of [employer] or Organizations: No    Attends Engineer, structural: Never    Marital Status: Married    Tobacco Counseling Counseling given: Not Answered   Clinical Intake:  Pre-visit preparation completed: Yes  Pain : No/denies pain     Nutritional Risks: None Diabetes: No  How often do you need to have someone help you when you read instructions, pamphlets, or other written materials from your doctor or pharmacy?: 1 - Never  Diabetic?no   Interpreter Needed?: No  Information entered by :: Renie Ora, LPN   Activities of Daily Living    08/04/2022   11:25 AM  In your present state of health, do you have any difficulty performing the following activities:  Hearing? 0  Vision? 0  Difficulty concentrating or making decisions? 0  Walking or climbing stairs? 0  Dressing or bathing? 0  Doing errands, shopping? 0  Preparing Food and eating ? N  Using the Toilet? N  In the past six months, have you accidently leaked urine? N  Do you have problems with loss of bowel control? N  Managing your Medications? N  Managing your Finances? N  Housekeeping or managing your Housekeeping? N    Patient  Care Team: Mechele Claude, MD as PCP - General (Family Medicine) Flo Shanks, MD as Consulting Physician (Otolaryngology) Lonie Peak, MD as Attending Physician (Radiation Oncology) Barrie Folk, RN (Inactive) as Oncology Nurse Navigator (Oncology) Anabel Bene, RD as Dietitian (Nutrition) Nonda Lou, PT (Inactive) as Physical Therapist (Physical Therapy) Barron Alvine, CCC-SLP as Speech Language Pathologist (Speech Pathology) Ernestene Mention, LCSW as Social Worker Janalyn Harder, MD (Inactive) as Consulting Physician (Dermatology)  Indicate any recent Medical Services you may have received from other than [facility] providers in the past year (date may be approximate).     Assessment:   This is a routine wellness examination for [place].  Hearing/Vision screen Vision Screening - Comments:: Wears rx glasses - up to date with routine eye exams with  Dr.Hecker  Dietary issues and exercise activities discussed: Current Exercise Habits: Home exercise routine, Type of exercise: walking, Time (Minutes): 30, Frequency (Times/Week): 2, Weekly Exercise (Minutes/Week): 60, Intensity: Mild, Exercise limited by: None identified   Goals Addressed             This Visit's Progress    Exercise 3x per week (30 min per time)         Depression Screen    08/04/2022   11:23 AM 04/22/2022    8:21 AM 01/31/2022    8:49 AM 12/13/2021    8:42 AM 10/21/2021    9:00 AM 10/01/2021   12:44 PM 07/31/2021   11:58 AM  PHQ 2/9 Scores  PHQ - 2 Score 0 0 0 0 0 0 0  PHQ- 9 Score   0 0       Fall Risk    08/04/2022   11:22 AM 04/22/2022    8:21 AM 01/31/2022    8:49 AM 12/13/2021    8:42 AM 11/04/2021  3:01 PM  Fall Risk   Falls in the past year? 0 0 1 0 0  Number falls in past yr: 0  0    Injury with Fall? 0  1    Risk for fall due to : No Fall Risks  History of fall(s)    Follow up Falls prevention discussed  Education provided      FALL RISK PREVENTION PERTAINING TO THE  HOME:  Any stairs in or around the home? Yes  If so, are there any without handrails? No  Home free of loose throw rugs in walkways, pet beds, electrical cords, etc? Yes  Adequate lighting in your home to reduce risk of falls? Yes   ASSISTIVE DEVICES UTILIZED TO PREVENT FALLS:  Life alert? No  Use of a cane, walker or w/c? No  Grab bars in the bathroom? No  Shower chair or bench in shower? No  Elevated toilet seat or a handicapped toilet? No          08/04/2022   11:26 AM 07/31/2021   12:01 PM  6CIT Screen  What Year? 0 points 0 points  What month? 0 points 0 points  What time? 0 points 0 points  Count back from 20 0 points 0 points  Months in reverse 0 points 0 points  Repeat phrase 0 points 0 points  Total Score 0 points 0 points    Immunizations Immunization History  Administered Date(s) Administered   Fluad Quad(high Dose 65+) 01/31/2022   Influenza, High Dose Seasonal PF 12/16/2016, 01/03/2018   Influenza,inj,Quad PF,6+ Mos 12/19/2015   Influenza-Unspecified 02/14/2019   PFIZER(Purple Top)SARS-COV-2 Vaccination 03/15/2019, 04/04/2019, 12/20/2019   Pneumococcal Conjugate-13 12/16/2016   Pneumococcal Polysaccharide-23 01/03/2018   Tdap 10/13/2017   Zoster Recombinat (Shingrix) 01/03/2018, 04/19/2020, 07/25/2020    TDAP status: Up to date  Flu Vaccine status: Up to date  Pneumococcal vaccine status: Up to date  Covid-19 vaccine status: Completed vaccines  Qualifies for Shingles Vaccine? Yes   Zostavax completed Yes   Shingrix Completed?: Yes  Screening Tests Health Maintenance  Topic Date Due   COVID-19 Vaccine (4 - 2023-24 season) 10/25/2021   COLON CANCER SCREENING ANNUAL FOBT  02/20/2023 (Originally 12/16/2019)   INFLUENZA VACCINE  09/25/2022   Lung Cancer Screening  01/23/2023   Medicare Annual Wellness (AWV)  08/04/2023   Colonoscopy  12/16/2023   DTaP/Tdap/Td (2 - Td or Tdap) 10/14/2027   Pneumonia Vaccine 17+ Years old  Completed   Hepatitis C  Screening  Completed   Zoster Vaccines- Shingrix  Completed   HPV VACCINES  Aged Out    Health Maintenance  Health Maintenance Due  Topic Date Due   COVID-19 Vaccine (4 - 2023-24 season) 10/25/2021    Colorectal cancer screening: Type of screening: Colonoscopy. Completed 12/16/2018. Repeat every 5 years  Lung Cancer Screening: (Low Dose CT Chest recommended if Age 74-80 years, 30 pack-year currently smoking OR have quit w/in 15years.) does qualify.   Lung Cancer Screening Referral: 01/22/2022  Additional Screening:  Hepatitis C Screening: does not qualify; Completed 04/05/2015  Vision Screening: Recommended annual ophthalmology exams for early detection of glaucoma and other disorders of the eye. Is the patient up to date with their annual eye exam?  Yes  Who is the provider or what is the name of the office in which the patient attends annual eye exams? Dr. Elmer Picker  If pt is not established with a provider, would they like to be referred to a provider to establish care?  No .   Dental Screening: Recommended annual dental exams for proper oral hygiene  Community Resource Referral / Chronic Care Management: CRR required this visit?  No   CCM required this visit?  No      Plan:     I have personally reviewed and noted the following in the patient's chart:   Medical and social history Use of alcohol, tobacco or illicit drugs  Current medications and supplements including opioid prescriptions. Patient is not currently taking opioid prescriptions. Functional ability and status Nutritional status Physical activity Advanced directives List of other physicians Hospitalizations, surgeries, and ER visits in previous 12 months Vitals Screenings to include cognitive, depression, and falls Referrals and appointments  In addition, I have reviewed and discussed with patient certain preventive protocols, quality metrics, and best practice recommendations. A written personalized  care plan for preventive services as well as general preventive health recommendations were provided to patient.     Lorrene Reid, LPN   03/26/8655   Nurse Notes: none

## 2022-08-07 DIAGNOSIS — M9901 Segmental and somatic dysfunction of cervical region: Secondary | ICD-10-CM | POA: Diagnosis not present

## 2022-08-07 DIAGNOSIS — M4004 Postural kyphosis, thoracic region: Secondary | ICD-10-CM | POA: Diagnosis not present

## 2022-08-07 DIAGNOSIS — M9902 Segmental and somatic dysfunction of thoracic region: Secondary | ICD-10-CM | POA: Diagnosis not present

## 2022-08-07 DIAGNOSIS — M9903 Segmental and somatic dysfunction of lumbar region: Secondary | ICD-10-CM | POA: Diagnosis not present

## 2022-08-07 DIAGNOSIS — M47816 Spondylosis without myelopathy or radiculopathy, lumbar region: Secondary | ICD-10-CM | POA: Diagnosis not present

## 2022-08-07 DIAGNOSIS — M47812 Spondylosis without myelopathy or radiculopathy, cervical region: Secondary | ICD-10-CM | POA: Diagnosis not present

## 2022-08-19 DIAGNOSIS — M4004 Postural kyphosis, thoracic region: Secondary | ICD-10-CM | POA: Diagnosis not present

## 2022-08-19 DIAGNOSIS — M47812 Spondylosis without myelopathy or radiculopathy, cervical region: Secondary | ICD-10-CM | POA: Diagnosis not present

## 2022-08-19 DIAGNOSIS — M9902 Segmental and somatic dysfunction of thoracic region: Secondary | ICD-10-CM | POA: Diagnosis not present

## 2022-08-19 DIAGNOSIS — M9903 Segmental and somatic dysfunction of lumbar region: Secondary | ICD-10-CM | POA: Diagnosis not present

## 2022-08-19 DIAGNOSIS — M9901 Segmental and somatic dysfunction of cervical region: Secondary | ICD-10-CM | POA: Diagnosis not present

## 2022-08-19 DIAGNOSIS — M47816 Spondylosis without myelopathy or radiculopathy, lumbar region: Secondary | ICD-10-CM | POA: Diagnosis not present

## 2022-08-26 DIAGNOSIS — M4004 Postural kyphosis, thoracic region: Secondary | ICD-10-CM | POA: Diagnosis not present

## 2022-08-26 DIAGNOSIS — M47816 Spondylosis without myelopathy or radiculopathy, lumbar region: Secondary | ICD-10-CM | POA: Diagnosis not present

## 2022-08-26 DIAGNOSIS — M47812 Spondylosis without myelopathy or radiculopathy, cervical region: Secondary | ICD-10-CM | POA: Diagnosis not present

## 2022-08-26 DIAGNOSIS — M9901 Segmental and somatic dysfunction of cervical region: Secondary | ICD-10-CM | POA: Diagnosis not present

## 2022-08-26 DIAGNOSIS — M9902 Segmental and somatic dysfunction of thoracic region: Secondary | ICD-10-CM | POA: Diagnosis not present

## 2022-08-26 DIAGNOSIS — M9903 Segmental and somatic dysfunction of lumbar region: Secondary | ICD-10-CM | POA: Diagnosis not present

## 2022-09-02 DIAGNOSIS — M4004 Postural kyphosis, thoracic region: Secondary | ICD-10-CM | POA: Diagnosis not present

## 2022-09-02 DIAGNOSIS — M47816 Spondylosis without myelopathy or radiculopathy, lumbar region: Secondary | ICD-10-CM | POA: Diagnosis not present

## 2022-09-02 DIAGNOSIS — M9901 Segmental and somatic dysfunction of cervical region: Secondary | ICD-10-CM | POA: Diagnosis not present

## 2022-09-02 DIAGNOSIS — M9902 Segmental and somatic dysfunction of thoracic region: Secondary | ICD-10-CM | POA: Diagnosis not present

## 2022-09-02 DIAGNOSIS — M47812 Spondylosis without myelopathy or radiculopathy, cervical region: Secondary | ICD-10-CM | POA: Diagnosis not present

## 2022-09-02 DIAGNOSIS — M9903 Segmental and somatic dysfunction of lumbar region: Secondary | ICD-10-CM | POA: Diagnosis not present

## 2022-09-09 DIAGNOSIS — M9903 Segmental and somatic dysfunction of lumbar region: Secondary | ICD-10-CM | POA: Diagnosis not present

## 2022-09-09 DIAGNOSIS — M9901 Segmental and somatic dysfunction of cervical region: Secondary | ICD-10-CM | POA: Diagnosis not present

## 2022-09-09 DIAGNOSIS — M47812 Spondylosis without myelopathy or radiculopathy, cervical region: Secondary | ICD-10-CM | POA: Diagnosis not present

## 2022-09-09 DIAGNOSIS — M4004 Postural kyphosis, thoracic region: Secondary | ICD-10-CM | POA: Diagnosis not present

## 2022-09-09 DIAGNOSIS — M47816 Spondylosis without myelopathy or radiculopathy, lumbar region: Secondary | ICD-10-CM | POA: Diagnosis not present

## 2022-09-09 DIAGNOSIS — M9902 Segmental and somatic dysfunction of thoracic region: Secondary | ICD-10-CM | POA: Diagnosis not present

## 2022-09-23 DIAGNOSIS — M9902 Segmental and somatic dysfunction of thoracic region: Secondary | ICD-10-CM | POA: Diagnosis not present

## 2022-09-23 DIAGNOSIS — M4004 Postural kyphosis, thoracic region: Secondary | ICD-10-CM | POA: Diagnosis not present

## 2022-09-23 DIAGNOSIS — M9901 Segmental and somatic dysfunction of cervical region: Secondary | ICD-10-CM | POA: Diagnosis not present

## 2022-09-23 DIAGNOSIS — M9903 Segmental and somatic dysfunction of lumbar region: Secondary | ICD-10-CM | POA: Diagnosis not present

## 2022-09-23 DIAGNOSIS — M47812 Spondylosis without myelopathy or radiculopathy, cervical region: Secondary | ICD-10-CM | POA: Diagnosis not present

## 2022-09-30 DIAGNOSIS — M25561 Pain in right knee: Secondary | ICD-10-CM | POA: Diagnosis not present

## 2022-10-03 DIAGNOSIS — M9901 Segmental and somatic dysfunction of cervical region: Secondary | ICD-10-CM | POA: Diagnosis not present

## 2022-10-03 DIAGNOSIS — M9903 Segmental and somatic dysfunction of lumbar region: Secondary | ICD-10-CM | POA: Diagnosis not present

## 2022-10-03 DIAGNOSIS — M9902 Segmental and somatic dysfunction of thoracic region: Secondary | ICD-10-CM | POA: Diagnosis not present

## 2022-10-03 DIAGNOSIS — M4004 Postural kyphosis, thoracic region: Secondary | ICD-10-CM | POA: Diagnosis not present

## 2022-10-03 DIAGNOSIS — M47812 Spondylosis without myelopathy or radiculopathy, cervical region: Secondary | ICD-10-CM | POA: Diagnosis not present

## 2022-10-17 DIAGNOSIS — M4004 Postural kyphosis, thoracic region: Secondary | ICD-10-CM | POA: Diagnosis not present

## 2022-10-17 DIAGNOSIS — M9902 Segmental and somatic dysfunction of thoracic region: Secondary | ICD-10-CM | POA: Diagnosis not present

## 2022-10-17 DIAGNOSIS — M47816 Spondylosis without myelopathy or radiculopathy, lumbar region: Secondary | ICD-10-CM | POA: Diagnosis not present

## 2022-10-17 DIAGNOSIS — M9903 Segmental and somatic dysfunction of lumbar region: Secondary | ICD-10-CM | POA: Diagnosis not present

## 2022-10-17 DIAGNOSIS — M47812 Spondylosis without myelopathy or radiculopathy, cervical region: Secondary | ICD-10-CM | POA: Diagnosis not present

## 2022-10-17 DIAGNOSIS — M9901 Segmental and somatic dysfunction of cervical region: Secondary | ICD-10-CM | POA: Diagnosis not present

## 2022-10-20 DIAGNOSIS — H524 Presbyopia: Secondary | ICD-10-CM | POA: Diagnosis not present

## 2022-10-20 DIAGNOSIS — H35371 Puckering of macula, right eye: Secondary | ICD-10-CM | POA: Diagnosis not present

## 2022-10-20 DIAGNOSIS — H26491 Other secondary cataract, right eye: Secondary | ICD-10-CM | POA: Diagnosis not present

## 2022-10-20 DIAGNOSIS — H35033 Hypertensive retinopathy, bilateral: Secondary | ICD-10-CM | POA: Diagnosis not present

## 2022-10-20 DIAGNOSIS — H35013 Changes in retinal vascular appearance, bilateral: Secondary | ICD-10-CM | POA: Diagnosis not present

## 2022-10-21 ENCOUNTER — Ambulatory Visit: Payer: Self-pay | Admitting: Family Medicine

## 2022-10-21 ENCOUNTER — Encounter: Payer: Self-pay | Admitting: Family Medicine

## 2022-10-21 VITALS — BP 124/68 | HR 78 | Temp 97.3°F | Ht 69.0 in | Wt 177.6 lb

## 2022-10-21 DIAGNOSIS — E785 Hyperlipidemia, unspecified: Secondary | ICD-10-CM

## 2022-10-21 DIAGNOSIS — I10 Essential (primary) hypertension: Secondary | ICD-10-CM | POA: Diagnosis not present

## 2022-10-21 DIAGNOSIS — R011 Cardiac murmur, unspecified: Secondary | ICD-10-CM | POA: Diagnosis not present

## 2022-10-21 DIAGNOSIS — R351 Nocturia: Secondary | ICD-10-CM

## 2022-10-21 DIAGNOSIS — N401 Enlarged prostate with lower urinary tract symptoms: Secondary | ICD-10-CM | POA: Diagnosis not present

## 2022-10-21 MED ORDER — FINASTERIDE 5 MG PO TABS: 5.0000 mg | ORAL_TABLET | Freq: Every day | ORAL | 3 refills | Status: DC

## 2022-10-21 MED ORDER — ATORVASTATIN CALCIUM 40 MG PO TABS: 40.0000 mg | ORAL_TABLET | Freq: Every day | ORAL | 3 refills | Status: AC

## 2022-10-21 MED ORDER — AMLODIPINE BESYLATE 10 MG PO TABS: ORAL_TABLET | ORAL | 3 refills | Status: DC

## 2022-10-21 NOTE — Progress Notes (Signed)
Subjective:  Patient ID: Terry Pacheco, male    DOB: 04/23/1950  Age: 72 y.o. MRN: [id number]  CC: No chief complaint on file.   HPI KAZ RISHER presents for  follow-up of hypertension. Patient has no history of headache chest pain or shortness of breath or recent cough. Patient also denies symptoms of TIA such as focal numbness or weakness. Patient denies side effects from medication. States taking it regularly.   History Tarl has a past medical history of Aortic stenosis, Arthritis, Cancer (HCC) (10/2016), GERD (gastroesophageal reflux disease) (07/06/2014), History of radiation therapy (12/30/2016- 02/18/2017), Hypertension, Sleep apnea, and Wears glasses.   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   His family history includes Heart disease in his mother; Stroke in his father.He reports that he quit smoking about 5 years ago. His smoking use included cigarettes. He started smoking about 39 years ago. He has a 22.5 pack-year smoking history. He has never used smokeless tobacco. He reports that he does not currently use alcohol. He reports that he does not currently use drugs after having used the following drugs: Marijuana.  Current Outpatient Medications on File Prior to Visit  Medication Sig Dispense Refill   acetaminophen (TYLENOL) 500 MG tablet Take 1,000 mg by mouth daily as needed for moderate pain or headache.     aspirin EC 81 MG tablet Take 81 mg by mouth daily.     B Complex CAPS TAKE 1 CAPSULE BY MOUTH EVERY DAY 100 capsule 2   calcium carbonate  (TUMS - DOSED IN MG ELEMENTAL CALCIUM) 500 MG chewable tablet Chew 2 tablets by mouth daily as needed for indigestion or heartburn.     escitalopram (LEXAPRO) 20 MG tablet Take 1 tablet (20 mg total) by mouth daily. 90 tablet 3   ezetimibe (ZETIA) 10 MG tablet Take 1 tablet (10 mg total) by mouth daily. 90 tablet 2   fluticasone (FLONASE) 50 MCG/ACT nasal spray SPRAY 2 SPRAYS INTO EACH NOSTRIL EVERY DAY 48 g 0   tamsulosin (FLOMAX) 0.4 MG CAPS capsule Take 2 capsules (0.8 mg total) by mouth at bedtime. For urine flow and prostate 180 capsule 3   Vitamin D, Ergocalciferol, (DRISDOL) 1.25 MG (50000 UNIT) CAPS capsule Take 1 capsule (50,000 Units total) by mouth every 7 (seven) days. 13 capsule 3   No current facility-administered medications on file prior to visit.    ROS Review of Systems  Constitutional:  Negative for fever.  Respiratory:  Negative for shortness of breath.   Cardiovascular:  Negative for chest pain.  Musculoskeletal:  Positive for arthralgias (hips & back- seeing chiropractor with improvement).  Skin:  Negative for rash.       2 moles on left face, 1 on right    Objective:  BP 124/68   Pulse 78   Temp (!) 97.3 F (36.3 C)   Ht 5\' 9"  (1.753 m)   Wt 177 lb 9.6 oz (80.6 kg)   SpO2 93%   BMI 26.23 kg/m   BP Readings from Last 3 Encounters:  10/21/22 124/68  04/22/22 125/66  01/31/22 128/77  Wt Readings from Last 3 Encounters:  10/21/22 177 lb 9.6 oz (80.6 kg)  08/04/22 178 lb (80.7 kg)  04/22/22 183 lb 9.6 oz (83.3 kg)     Physical Exam Vitals reviewed.  Constitutional:      Appearance: He is well-developed.  HENT:     Head: Normocephalic and atraumatic.     Right Ear: External ear normal.     Left Ear: External ear normal.     Mouth/Throat:     Pharynx: No oropharyngeal exudate or posterior oropharyngeal erythema.  Eyes:     Pupils: Pupils are equal, round, and reactive to light.  Cardiovascular:     Rate and Rhythm: Normal rate and regular  rhythm.     Heart sounds: Murmur (3/6 holosytolic) heard.  Pulmonary:     Effort: No respiratory distress.     Breath sounds: Normal breath sounds.  Musculoskeletal:     Cervical back: Normal range of motion and neck supple.  Neurological:     Mental Status: He is alert and oriented to person, place, and time.       Assessment & Plan:   Diagnoses and all orders for this visit:  Benign prostatic hyperplasia with nocturia -     finasteride (PROSCAR) 5 MG tablet; Take 1 tablet (5 mg total) by mouth daily. For urine flow -     CBC with Differential/Platelet -     CMP14+EGFR -     PSA, total and free  Primary hypertension -     amLODipine (NORVASC) 10 MG tablet; TAKE 1 TABLET EVERY DAY FOR FOR BLOOD PRESSURE -     CBC with Differential/Platelet -     CMP14+EGFR  Hyperlipidemia with target LDL less than 100 -     atorvastatin (LIPITOR) 40 MG tablet; Take 1 tablet (40 mg total) by mouth daily. -     Lipid panel -     CBC with Differential/Platelet -     CMP14+EGFR  Murmur, cardiac   Allergies as of 10/21/2022       Reactions   Crestor [rosuvastatin]    Muscle aches        Medication List        Accurate as of October 21, 2022  9:09 AM. If you have any questions, ask your nurse or doctor.          STOP taking these medications    oxyCODONE-acetaminophen 5-325 MG tablet Commonly known as: PERCOCET/ROXICET Stopped by: Tonette Koehne       TAKE these medications    acetaminophen 500 MG tablet Commonly known as: TYLENOL Take 1,000 mg by mouth daily as needed for moderate pain or headache.   amLODipine 10 MG tablet Commonly known as: NORVASC TAKE 1 TABLET EVERY DAY FOR FOR BLOOD PRESSURE   aspirin EC 81 MG tablet Take 81 mg by mouth daily.   atorvastatin 40 MG tablet Commonly known as: LIPITOR Take 1 tablet (40 mg total) by mouth daily.   B Complex Caps TAKE 1 CAPSULE BY MOUTH EVERY DAY   calcium carbonate 500 MG chewable tablet Commonly known  as: TUMS - dosed in mg elemental calcium Chew 2 tablets by mouth daily as needed for indigestion or heartburn.   escitalopram 20 MG tablet Commonly known as: LEXAPRO Take 1 tablet (20 mg total) by mouth daily.   ezetimibe 10 MG tablet Commonly known as: ZETIA Take 1 tablet (10 mg total) by mouth daily.   finasteride 5 MG tablet Commonly known as: Proscar  Take 1 tablet (5 mg total) by mouth daily. For urine flow Started by: Mattalynn Crandle   fluticasone 50 MCG/ACT nasal spray Commonly known as: FLONASE SPRAY 2 SPRAYS INTO EACH NOSTRIL EVERY DAY   tamsulosin 0.4 MG Caps capsule Commonly known as: FLOMAX Take 2 capsules (0.8 mg total) by mouth at bedtime. For urine flow and prostate   Vitamin D (Ergocalciferol) 1.25 MG (50000 UNIT) Caps capsule Commonly known as: DRISDOL Take 1 capsule (50,000 Units total) by mouth every 7 (seven) days.        Meds ordered this encounter  Medications   amLODipine (NORVASC) 10 MG tablet    Sig: TAKE 1 TABLET EVERY DAY FOR FOR BLOOD PRESSURE    Dispense:  90 tablet    Refill:  3   atorvastatin (LIPITOR) 40 MG tablet    Sig: Take 1 tablet (40 mg total) by mouth daily.    Dispense:  90 tablet    Refill:  3   finasteride (PROSCAR) 5 MG tablet    Sig: Take 1 tablet (5 mg total) by mouth daily. For urine flow    Dispense:  90 tablet    Refill:  3    Added finesteride today for nocturia  Follow-up: Return in about 6 months (around 04/23/2023).  Mechele Claude, M.D.

## 2022-10-22 LAB — CBC WITH DIFFERENTIAL/PLATELET
Basophils Absolute: 0 10*3/uL (ref 0.0–0.2)
Basos: 1 %
EOS (ABSOLUTE): 0.3 10*3/uL (ref 0.0–0.4)
Eos: 6 %
Hematocrit: 44.1 % (ref 37.5–51.0)
Hemoglobin: 14.5 g/dL (ref 13.0–17.7)
Immature Grans (Abs): 0 10*3/uL (ref 0.0–0.1)
Immature Granulocytes: 0 %
Lymphocytes Absolute: 1 10*3/uL (ref 0.7–3.1)
Lymphs: 19 %
MCH: 29.8 pg (ref 26.6–33.0)
MCHC: 32.9 g/dL (ref 31.5–35.7)
MCV: 91 fL (ref 79–97)
Monocytes Absolute: 0.4 10*3/uL (ref 0.1–0.9)
Monocytes: 8 %
Neutrophils Absolute: 3.5 10*3/uL (ref 1.4–7.0)
Neutrophils: 66 %
Platelets: 155 10*3/uL (ref 150–450)
RBC: 4.86 x10E6/uL (ref 4.14–5.80)
RDW: 12.7 % (ref 11.6–15.4)
WBC: 5.3 10*3/uL (ref 3.4–10.8)

## 2022-10-22 LAB — CMP14+EGFR
ALT: 14 IU/L (ref 0–44)
AST: 16 IU/L (ref 0–40)
Albumin: 4.5 g/dL (ref 3.8–4.8)
Alkaline Phosphatase: 121 IU/L (ref 44–121)
BUN/Creatinine Ratio: 17 (ref 10–24)
BUN: 14 mg/dL (ref 8–27)
Bilirubin Total: 0.6 mg/dL (ref 0.0–1.2)
CO2: 21 mmol/L (ref 20–29)
Calcium: 9.1 mg/dL (ref 8.6–10.2)
Chloride: 100 mmol/L (ref 96–106)
Creatinine, Ser: 0.83 mg/dL (ref 0.76–1.27)
Globulin, Total: 2.2 g/dL (ref 1.5–4.5)
Glucose: 106 mg/dL — ABNORMAL HIGH (ref 70–99)
Potassium: 4.3 mmol/L (ref 3.5–5.2)
Sodium: 135 mmol/L (ref 134–144)
Total Protein: 6.7 g/dL (ref 6.0–8.5)
eGFR: 93 mL/min/{1.73_m2} (ref >59)

## 2022-10-22 LAB — LIPID PANEL
Chol/HDL Ratio: 1.9 ratio (ref 0.0–5.0)
Cholesterol, Total: 117 mg/dL (ref 100–199)
HDL: 61 mg/dL (ref >39)
LDL Chol Calc (NIH): 44 mg/dL (ref 0–99)
Triglycerides: 51 mg/dL (ref 0–149)
VLDL Cholesterol Cal: 12 mg/dL (ref 5–40)

## 2022-10-22 LAB — PSA, TOTAL AND FREE
PSA, Free Pct: 13.1 %
PSA, Free: 0.38 ng/mL
Prostate Specific Ag, Serum: 2.9 ng/mL (ref 0.0–4.0)

## 2022-10-22 NOTE — Progress Notes (Signed)
Hello Terry Pacheco,  Your lab result is normal and/or stable.Some minor variations that are not significant are commonly marked abnormal, but do not represent any medical problem for you.  Best regards, Warren Stacks, M.D.

## 2022-10-23 ENCOUNTER — Other Ambulatory Visit: Payer: Self-pay | Admitting: Family Medicine

## 2022-10-23 DIAGNOSIS — K219 Gastro-esophageal reflux disease without esophagitis: Secondary | ICD-10-CM

## 2022-11-06 DIAGNOSIS — M9901 Segmental and somatic dysfunction of cervical region: Secondary | ICD-10-CM | POA: Diagnosis not present

## 2022-11-06 DIAGNOSIS — M47812 Spondylosis without myelopathy or radiculopathy, cervical region: Secondary | ICD-10-CM | POA: Diagnosis not present

## 2022-11-06 DIAGNOSIS — M4004 Postural kyphosis, thoracic region: Secondary | ICD-10-CM | POA: Diagnosis not present

## 2022-11-06 DIAGNOSIS — M9903 Segmental and somatic dysfunction of lumbar region: Secondary | ICD-10-CM | POA: Diagnosis not present

## 2022-11-06 DIAGNOSIS — M9902 Segmental and somatic dysfunction of thoracic region: Secondary | ICD-10-CM | POA: Diagnosis not present

## 2022-11-18 ENCOUNTER — Other Ambulatory Visit: Payer: Self-pay | Admitting: Family Medicine

## 2022-11-18 DIAGNOSIS — F411 Generalized anxiety disorder: Secondary | ICD-10-CM

## 2022-11-24 ENCOUNTER — Other Ambulatory Visit: Payer: Self-pay | Admitting: Family Medicine

## 2022-11-24 DIAGNOSIS — K219 Gastro-esophageal reflux disease without esophagitis: Secondary | ICD-10-CM

## 2022-12-27 ENCOUNTER — Other Ambulatory Visit: Payer: Self-pay | Admitting: Family Medicine

## 2022-12-27 DIAGNOSIS — K219 Gastro-esophageal reflux disease without esophagitis: Secondary | ICD-10-CM

## 2022-12-31 ENCOUNTER — Other Ambulatory Visit: Payer: Self-pay | Admitting: Family Medicine

## 2022-12-31 ENCOUNTER — Telehealth: Payer: Self-pay | Admitting: Family Medicine

## 2022-12-31 DIAGNOSIS — K219 Gastro-esophageal reflux disease without esophagitis: Secondary | ICD-10-CM

## 2022-12-31 MED ORDER — OMEPRAZOLE 20 MG PO CPDR: DELAYED_RELEASE_CAPSULE | ORAL | 3 refills | Status: AC

## 2022-12-31 NOTE — Telephone Encounter (Signed)
Please let the patient know that I sent their prescription to their pharmacy. Thanks, WS 

## 2023-01-21 ENCOUNTER — Other Ambulatory Visit: Payer: Self-pay | Admitting: Family Medicine

## 2023-01-28 ENCOUNTER — Ambulatory Visit: Payer: HMO | Admitting: Family Medicine

## 2023-01-28 ENCOUNTER — Encounter: Payer: Self-pay | Admitting: Family Medicine

## 2023-01-28 VITALS — BP 124/65 | HR 73 | Temp 97.9°F | Ht 69.0 in | Wt 187.2 lb

## 2023-01-28 DIAGNOSIS — J069 Acute upper respiratory infection, unspecified: Secondary | ICD-10-CM | POA: Diagnosis not present

## 2023-01-28 DIAGNOSIS — J0100 Acute maxillary sinusitis, unspecified: Secondary | ICD-10-CM

## 2023-01-28 LAB — VERITOR FLU A/B WAIVED
Influenza A: NEGATIVE
Influenza B: NEGATIVE

## 2023-01-28 MED ORDER — PSEUDOEPHEDRINE-GUAIFENESIN ER 120-1200 MG PO TB12: 1.0000 | ORAL_TABLET | Freq: Two times a day (BID) | ORAL | 0 refills | Status: DC

## 2023-01-28 MED ORDER — AMOXICILLIN-POT CLAVULANATE 875-125 MG PO TABS: 1.0000 | ORAL_TABLET | Freq: Two times a day (BID) | ORAL | 0 refills | Status: DC

## 2023-01-28 NOTE — Progress Notes (Signed)
Subjective:  Patient ID: Terry Pacheco, male    DOB: 02-Dec-1950  Age: 72 y.o. MRN: [id number]  CC: Nasal Congestion, Sore Throat, and Cough   HPI Terry Pacheco presents for 2 weeks of head stopped up, chest congestion, cough. Cough with yellow gunk. Cough is every 10 min in spels. Feels rattle deep in chest. Not dyspneic. Has had subjective fever. Sholders and legs hurt a lot. Feels fatigued.      01/28/2023    2:55 PM 10/21/2022    8:19 AM 08/04/2022   11:23 AM  Depression screen PHQ 2/9  Decreased Interest 0 0 0  Down, Depressed, Hopeless 0 0 0  PHQ - 2 Score 0 0 0    History Terry Pacheco has a past medical history of Aortic stenosis, Arthritis, Cancer (HCC) (10/2016), GERD (gastroesophageal reflux disease) (07/06/2014), History of radiation therapy (12/30/2016- 02/18/2017), Hypertension, Sleep apnea, and Wears glasses.   Terry Pacheco has a past surgical history that includes Hernia repair; vocal cord polyp ; Total hip arthroplasty (Right, 10/16/2015); polyp removal (2008); IR FLUORO GUIDE PORT INSERTION RIGHT (12/25/2016); IR US Guide Vasc Access Right (12/25/2016); IR GASTROSTOMY TUBE MOD SED (12/25/2016); IR CV Line Injection (01/14/2017); IR REMOVAL TUN ACCESS W/ PORT W/O FL MOD SED (03/06/2017); IR GASTROSTOMY TUBE REMOVAL/REPAIR (06/05/2017); and LEFT HEART CATH AND CORONARY ANGIOGRAPHY (N/A, 12/07/2017).   His family history includes Heart disease in his mother; Stroke in his father.Terry Pacheco reports that Terry Pacheco quit smoking about 6 years ago. His smoking use included cigarettes. Terry Pacheco started smoking about 40 years ago. Terry Pacheco has a 22.5 pack-year smoking history. Terry Pacheco has never used smokeless tobacco. Terry Pacheco reports that Terry Pacheco does not currently use alcohol. Terry Pacheco reports that Terry Pacheco does not currently use drugs after having used the following drugs: Marijuana.    ROS Review of Systems  Constitutional:  Positive for activity change, appetite change, fatigue and fever. Negative for chills.  HENT:  Positive for congestion, postnasal  drip, rhinorrhea and sinus pressure. Negative for ear discharge, ear pain, hearing loss, nosebleeds, sneezing and trouble swallowing.   Respiratory:  Positive for cough. Negative for chest tightness and shortness of breath.   Cardiovascular:  Negative for chest pain and palpitations.  Skin:  Negative for rash.    Objective:  BP 124/65   Pulse 73   Temp 97.9 F (36.6 C)   Ht 5\' 9"  (1.753 m)   Wt 187 lb 3.2 oz (84.9 kg)   SpO2 93%   BMI 27.64 kg/m   BP Readings from Last 3 Encounters:  01/28/23 124/65  10/21/22 124/68  04/22/22 125/66    Wt Readings from Last 3 Encounters:  01/28/23 187 lb 3.2 oz (84.9 kg)  10/21/22 177 lb 9.6 oz (80.6 kg)  08/04/22 178 lb (80.7 kg)     Physical Exam Constitutional:      Appearance: Terry Pacheco is well-developed.  HENT:     Head: Normocephalic and atraumatic.     Right Ear: Tympanic membrane and external ear normal. No decreased hearing noted.     Left Ear: Tympanic membrane and external ear normal. No decreased hearing noted.     Nose: Mucosal edema and rhinorrhea present.     Right Sinus: No frontal sinus tenderness.     Left Sinus: No frontal sinus tenderness.     Mouth/Throat:     Mouth: Mucous membranes are moist.     Pharynx: Uvula midline. Oropharyngeal exudate present. No posterior oropharyngeal erythema.  Neck:     Meningeal: Brudzinski's sign  absent.  Cardiovascular:     Rate and Rhythm: Normal rate and regular rhythm.     Heart sounds: No murmur heard. Pulmonary:     Effort: No respiratory distress.     Breath sounds: Normal breath sounds.  Lymphadenopathy:     Head:     Right side of head: No preauricular adenopathy.     Left side of head: No preauricular adenopathy.     Cervical:     Right cervical: No superficial cervical adenopathy.    Left cervical: No superficial cervical adenopathy.    Flu A/B test neg.   Assessment & Plan:   Terry Pacheco was seen today for nasal congestion, sore throat and cough.  Diagnoses and all  orders for this visit:  Viral URI -     Veritor Flu A/B Waived  Acute maxillary sinusitis, recurrence not specified  Other orders -     amoxicillin-clavulanate (AUGMENTIN) 875-125 MG tablet; Take 1 tablet by mouth 2 (two) times daily. Take all of this medication -     Pseudoephedrine-Guaifenesin [phone] MG TB12; Take 1 tablet by mouth 2 (two) times daily. For congestion       I am having Terry Pacheco start on amoxicillin-clavulanate and Pseudoephedrine-Guaifenesin. I am also having him maintain his fluticasone, aspirin EC, acetaminophen, calcium carbonate, B Complex, tamsulosin, amLODipine, atorvastatin, finasteride, escitalopram, Vitamin D (Ergocalciferol), omeprazole, and ezetimibe.  Allergies as of 01/28/2023       Reactions   Crestor [rosuvastatin]    Muscle aches        Medication List        Accurate as of January 28, 2023  3:15 PM. If you have any questions, ask your nurse or doctor.          acetaminophen 500 MG tablet Commonly known as: TYLENOL Take 1,000 mg by mouth daily as needed for moderate pain or headache.   amLODipine 10 MG tablet Commonly known as: NORVASC TAKE 1 TABLET EVERY DAY FOR FOR BLOOD PRESSURE   amoxicillin-clavulanate 875-125 MG tablet Commonly known as: AUGMENTIN Take 1 tablet by mouth 2 (two) times daily. Take all of this medication Started by: Broadus John Cardarius Senat   aspirin EC 81 MG tablet Take 81 mg by mouth daily.   atorvastatin 40 MG tablet Commonly known as: LIPITOR Take 1 tablet (40 mg total) by mouth daily.   B Complex Caps TAKE 1 CAPSULE BY MOUTH EVERY DAY   calcium carbonate 500 MG chewable tablet Commonly known as: TUMS - dosed in mg elemental calcium Chew 2 tablets by mouth daily as needed for indigestion or heartburn.   escitalopram 20 MG tablet Commonly known as: LEXAPRO TAKE 1 TABLET BY MOUTH EVERY DAY   ezetimibe 10 MG tablet Commonly known as: ZETIA TAKE 1 TABLET BY MOUTH EVERY DAY   finasteride 5 MG  tablet Commonly known as: Proscar Take 1 tablet (5 mg total) by mouth daily. For urine flow   fluticasone 50 MCG/ACT nasal spray Commonly known as: FLONASE SPRAY 2 SPRAYS INTO EACH NOSTRIL EVERY DAY   omeprazole 20 MG capsule Commonly known as: PRILOSEC TAKE 1 CAPSULE BY MOUTH EVERY DAY   Pseudoephedrine-Guaifenesin [phone] MG Tb12 Take 1 tablet by mouth 2 (two) times daily. For congestion Started by: Broadus John Amaris Delafuente   tamsulosin 0.4 MG Caps capsule Commonly known as: FLOMAX Take 2 capsules (0.8 mg total) by mouth at bedtime. For urine flow and prostate   Vitamin D (Ergocalciferol) 1.25 MG (50000 UNIT) Caps capsule Commonly known as: DRISDOL  TAKE 1 CAPSULE (50,000 UNITS TOTAL) BY MOUTH EVERY 7 (SEVEN) DAYS         Follow-up: Return if symptoms worsen or fail to improve.  Mechele Claude, M.D.

## 2023-03-12 DIAGNOSIS — R221 Localized swelling, mass and lump, neck: Secondary | ICD-10-CM | POA: Diagnosis not present

## 2023-03-12 DIAGNOSIS — Z85818 Personal history of malignant neoplasm of other sites of lip, oral cavity, and pharynx: Secondary | ICD-10-CM | POA: Diagnosis not present

## 2023-03-12 DIAGNOSIS — H6123 Impacted cerumen, bilateral: Secondary | ICD-10-CM | POA: Diagnosis not present

## 2023-03-12 DIAGNOSIS — Z923 Personal history of irradiation: Secondary | ICD-10-CM | POA: Diagnosis not present

## 2023-03-23 DIAGNOSIS — Z08 Encounter for follow-up examination after completed treatment for malignant neoplasm: Secondary | ICD-10-CM | POA: Diagnosis not present

## 2023-03-23 DIAGNOSIS — C099 Malignant neoplasm of tonsil, unspecified: Secondary | ICD-10-CM | POA: Diagnosis not present

## 2023-03-23 DIAGNOSIS — Z923 Personal history of irradiation: Secondary | ICD-10-CM | POA: Diagnosis not present

## 2023-03-23 DIAGNOSIS — R221 Localized swelling, mass and lump, neck: Secondary | ICD-10-CM | POA: Diagnosis not present

## 2023-03-23 DIAGNOSIS — H6123 Impacted cerumen, bilateral: Secondary | ICD-10-CM | POA: Diagnosis not present

## 2023-03-23 DIAGNOSIS — Z85818 Personal history of malignant neoplasm of other sites of lip, oral cavity, and pharynx: Secondary | ICD-10-CM | POA: Diagnosis not present

## 2023-04-19 ENCOUNTER — Other Ambulatory Visit: Payer: Self-pay | Admitting: Family Medicine

## 2023-04-21 ENCOUNTER — Ambulatory Visit (INDEPENDENT_AMBULATORY_CARE_PROVIDER_SITE_OTHER): Payer: HMO | Admitting: Family Medicine

## 2023-04-21 ENCOUNTER — Other Ambulatory Visit: Payer: Self-pay | Admitting: Family Medicine

## 2023-04-21 ENCOUNTER — Encounter: Payer: Self-pay | Admitting: Family Medicine

## 2023-04-21 ENCOUNTER — Telehealth: Payer: Self-pay | Admitting: Family Medicine

## 2023-04-21 VITALS — BP 118/70 | HR 70 | Temp 97.2°F | Ht 69.0 in | Wt 188.0 lb

## 2023-04-21 DIAGNOSIS — R351 Nocturia: Secondary | ICD-10-CM

## 2023-04-21 DIAGNOSIS — N401 Enlarged prostate with lower urinary tract symptoms: Secondary | ICD-10-CM | POA: Diagnosis not present

## 2023-04-21 DIAGNOSIS — E785 Hyperlipidemia, unspecified: Secondary | ICD-10-CM | POA: Diagnosis not present

## 2023-04-21 DIAGNOSIS — I350 Nonrheumatic aortic (valve) stenosis: Secondary | ICD-10-CM

## 2023-04-21 DIAGNOSIS — F411 Generalized anxiety disorder: Secondary | ICD-10-CM

## 2023-04-21 DIAGNOSIS — I10 Essential (primary) hypertension: Secondary | ICD-10-CM

## 2023-04-21 MED ORDER — EZETIMIBE 10 MG PO TABS: 10.0000 mg | ORAL_TABLET | Freq: Every day | ORAL | 0 refills | Status: AC

## 2023-04-21 NOTE — Progress Notes (Signed)
 Subjective:  Patient ID: Terry Pacheco, male    DOB: [birth year]  Age: 73 y.o. MRN: [id number]  CC: Medical Management of Chronic Issues   HPI Terry Pacheco presents for  follow-up of hypertension. Patient has no history of headache chest pain or shortness of breath or recent cough. Patient also denies symptoms of TIA such as focal numbness or weakness. Patient denies side effects from medication. States taking it regularly. Patient in for follow-up of GERD. Currently asymptomatic taking  PPI daily. There is no chest pain or heartburn. No hematemesis and no melena. No dysphagia or choking. Onset is remote. Progression is stable. Complicating factors, none.   in for follow-up of elevated cholesterol. Doing well without complaints on current medication. Denies side effects of statin including myalgia and arthralgia and nausea. Currently no chest pain, shortness of breath or other cardiovascular related symptoms noted.  BPH still problematic. Up 3-5 times a night. Frequency during day.   Ribs kinda tender right flank.   History Terry Pacheco has a past medical history of Aortic stenosis, Arthritis, Cancer (HCC) (10/2016), GERD (gastroesophageal reflux disease) (07/06/2014), History of radiation therapy (12/30/2016- 02/18/2017), Hypertension, Sleep apnea, and Wears glasses.   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   His family history includes Heart disease in his mother; Stroke in his father.He reports that he quit smoking about 6 years ago. His smoking use included  cigarettes. He started smoking about 40 years ago. He has a 22.5 pack-year smoking history. He has never used smokeless tobacco. He reports that he does not currently use alcohol. He reports that he does not currently use drugs after having used the following drugs: Marijuana.  Current Outpatient Medications on File Prior to Visit  Medication Sig Dispense Refill   acetaminophen (TYLENOL) 500 MG tablet Take 1,000 mg by mouth daily as needed for moderate pain or headache.     amLODipine (NORVASC) 10 MG tablet TAKE 1 TABLET EVERY DAY FOR FOR BLOOD PRESSURE 90 tablet 3   aspirin EC 81 MG tablet Take 81 mg by mouth daily.     atorvastatin (LIPITOR) 40 MG tablet Take 1 tablet (40 mg total) by mouth daily. 90 tablet 3   B Complex CAPS TAKE 1 CAPSULE BY MOUTH EVERY DAY 100 capsule 2   calcium carbonate (TUMS - DOSED IN MG ELEMENTAL CALCIUM) 500 MG chewable tablet Chew 2 tablets by mouth daily as needed for indigestion or heartburn.     finasteride (PROSCAR) 5 MG tablet Take 1 tablet (5 mg total) by mouth daily. For urine flow 90 tablet 3   fluticasone (FLONASE) 50 MCG/ACT nasal spray SPRAY 2 SPRAYS INTO EACH NOSTRIL EVERY DAY 48 g 0   omeprazole (PRILOSEC) 20 MG capsule TAKE 1 CAPSULE BY MOUTH EVERY DAY 90 capsule 3   tamsulosin (FLOMAX) 0.4 MG CAPS capsule Take 2 capsules (0.8 mg total) by mouth at bedtime. For urine flow and prostate 180 capsule 3   Vitamin D, Ergocalciferol, (DRISDOL) 1.25 MG (50000 UNIT) CAPS capsule TAKE 1 CAPSULE (50,000 UNITS TOTAL) BY MOUTH EVERY 7 (SEVEN) DAYS 13 capsule 3   Pseudoephedrine-Guaifenesin  [phone] MG TB12 Take 1 tablet by mouth 2 (two) times daily. For congestion (Patient not taking: Reported on 04/21/2023) 12 tablet 0   No current facility-administered medications on file prior to visit.    ROS Review of Systems  Constitutional:  Negative for fever.  Respiratory:  Negative for shortness of breath.   Cardiovascular:  Positive for chest pain (right anterior ax  line at lower ribs - mild tenderness persists from old rib fx.).  Musculoskeletal:  Negative for arthralgias.  Skin:  Negative for rash.    Objective:  BP 118/70   Pulse 70   Temp (!) 97.2 F (36.2 C)   Ht 5\' 9"  (1.753 m)   Wt 188 lb (85.3 kg)   SpO2 95%   BMI 27.76 kg/m   BP Readings from Last 3 Encounters:  04/21/23 118/70  01/28/23 124/65  10/21/22 124/68    Wt Readings from Last 3 Encounters:  04/21/23 188 lb (85.3 kg)  01/28/23 187 lb 3.2 oz (84.9 kg)  10/21/22 177 lb 9.6 oz (80.6 kg)     Physical Exam Vitals reviewed.  Constitutional:      Appearance: He is well-developed.  HENT:     Head: Normocephalic and atraumatic.     Right Ear: External ear normal.     Left Ear: External ear normal.     Mouth/Throat:     Pharynx: No oropharyngeal exudate or posterior oropharyngeal erythema.  Eyes:     Pupils: Pupils are equal, round, and reactive to light.  Cardiovascular:     Rate and Rhythm: Normal rate and regular rhythm.     Heart sounds: No murmur heard. Pulmonary:     Effort: No respiratory distress.     Breath sounds: Normal breath sounds.  Musculoskeletal:     Cervical back: Normal range of motion and neck supple.  Neurological:     Mental Status: He is alert and oriented to person, place, and time.       Assessment & Plan:   Terry Pacheco was seen today for medical management of chronic issues.  Diagnoses and all orders for this visit:  Benign prostatic hyperplasia with nocturia -     Ambulatory referral to Urology -     CBC with Differential/Platelet -     CMP14+EGFR -     PSA, total and free  Hyperlipidemia with target LDL less than 100 -     CBC with Differential/Platelet -     CMP14+EGFR -     Lipid panel  Aortic valve stenosis, etiology of cardiac valve disease unspecified -     CBC with Differential/Platelet -     CMP14+EGFR  Primary hypertension -     CBC with Differential/Platelet -     CMP14+EGFR  Other orders -     ezetimibe  (ZETIA) 10 MG tablet; Take 1 tablet (10 mg total) by mouth daily.   Allergies as of 04/21/2023       Reactions   Crestor [rosuvastatin]    Muscle aches        Medication List        Accurate as of April 21, 2023  9:48 AM. If you have any questions, ask your nurse or doctor.          STOP taking these medications    amoxicillin-clavulanate 875-125 MG tablet Commonly known as: AUGMENTIN Stopped by: Lowana Hable       TAKE these medications    acetaminophen 500 MG tablet Commonly known as: TYLENOL Take  1,000 mg by mouth daily as needed for moderate pain or headache.   amLODipine 10 MG tablet Commonly known as: NORVASC TAKE 1 TABLET EVERY DAY FOR FOR BLOOD PRESSURE   aspirin EC 81 MG tablet Take 81 mg by mouth daily.   atorvastatin 40 MG tablet Commonly known as: LIPITOR Take 1 tablet (40 mg total) by mouth daily.   B Complex Caps TAKE 1 CAPSULE BY MOUTH EVERY DAY   calcium carbonate 500 MG chewable tablet Commonly known as: TUMS - dosed in mg elemental calcium Chew 2 tablets by mouth daily as needed for indigestion or heartburn.   escitalopram 20 MG tablet Commonly known as: LEXAPRO TAKE 1 TABLET BY MOUTH EVERY DAY   ezetimibe 10 MG tablet Commonly known as: ZETIA Take 1 tablet (10 mg total) by mouth daily.   finasteride 5 MG tablet Commonly known as: Proscar Take 1 tablet (5 mg total) by mouth daily. For urine flow   fluticasone 50 MCG/ACT nasal spray Commonly known as: FLONASE SPRAY 2 SPRAYS INTO EACH NOSTRIL EVERY DAY   omeprazole 20 MG capsule Commonly known as: PRILOSEC TAKE 1 CAPSULE BY MOUTH EVERY DAY   Pseudoephedrine-Guaifenesin [phone] MG Tb12 Take 1 tablet by mouth 2 (two) times daily. For congestion   tamsulosin 0.4 MG Caps capsule Commonly known as: FLOMAX Take 2 capsules (0.8 mg total) by mouth at bedtime. For urine flow and prostate   Vitamin D (Ergocalciferol) 1.25 MG (50000 UNIT) Caps capsule Commonly known as:  DRISDOL TAKE 1 CAPSULE (50,000 UNITS TOTAL) BY MOUTH EVERY 7 (SEVEN) DAYS        Meds ordered this encounter  Medications   ezetimibe (ZETIA) 10 MG tablet    Sig: Take 1 tablet (10 mg total) by mouth daily.    Dispense:  90 tablet    Refill:  0      Follow-up: Return in about 6 months (around 10/19/2023) for [facility] physical.  Mechele Claude, M.D.

## 2023-04-22 LAB — CBC WITH DIFFERENTIAL/PLATELET
Basophils Absolute: 0 10*3/uL (ref 0.0–0.2)
Basos: 0 %
EOS (ABSOLUTE): 0.7 10*3/uL — ABNORMAL HIGH (ref 0.0–0.4)
Eos: 11 %
Hematocrit: 44 % (ref 37.5–51.0)
Hemoglobin: 15.3 g/dL (ref 13.0–17.7)
Immature Grans (Abs): 0 10*3/uL (ref 0.0–0.1)
Immature Granulocytes: 0 %
Lymphocytes Absolute: 1.3 10*3/uL (ref 0.7–3.1)
Lymphs: 20 %
MCH: 32.1 pg (ref 26.6–33.0)
MCHC: 34.8 g/dL (ref 31.5–35.7)
MCV: 92 fL (ref 79–97)
Monocytes Absolute: 0.5 10*3/uL (ref 0.1–0.9)
Monocytes: 8 %
Neutrophils Absolute: 4.1 10*3/uL (ref 1.4–7.0)
Neutrophils: 61 %
Platelets: 163 10*3/uL (ref 150–450)
RBC: 4.77 x10E6/uL (ref 4.14–5.80)
RDW: 12.9 % (ref 11.6–15.4)
WBC: 6.8 10*3/uL (ref 3.4–10.8)

## 2023-04-22 LAB — CMP14+EGFR
ALT: 15 [IU]/L (ref 0–44)
AST: 19 [IU]/L (ref 0–40)
Albumin: 4.1 g/dL (ref 3.8–4.8)
Alkaline Phosphatase: 128 [IU]/L — ABNORMAL HIGH (ref 44–121)
BUN/Creatinine Ratio: 17 (ref 10–24)
BUN: 17 mg/dL (ref 8–27)
Bilirubin Total: 0.6 mg/dL (ref 0.0–1.2)
CO2: 22 mmol/L (ref 20–29)
Calcium: 9.1 mg/dL (ref 8.6–10.2)
Chloride: 100 mmol/L (ref 96–106)
Creatinine, Ser: 0.98 mg/dL (ref 0.76–1.27)
Globulin, Total: 2.5 g/dL (ref 1.5–4.5)
Glucose: 98 mg/dL (ref 70–99)
Potassium: 4.6 mmol/L (ref 3.5–5.2)
Sodium: 135 mmol/L (ref 134–144)
Total Protein: 6.6 g/dL (ref 6.0–8.5)
eGFR: 82 mL/min/{1.73_m2} (ref >59)

## 2023-04-22 LAB — LIPID PANEL
Chol/HDL Ratio: 2.2 {ratio} (ref 0.0–5.0)
Cholesterol, Total: 104 mg/dL (ref 100–199)
HDL: 47 mg/dL (ref >39)
LDL Chol Calc (NIH): 44 mg/dL (ref 0–99)
Triglycerides: 56 mg/dL (ref 0–149)
VLDL Cholesterol Cal: 13 mg/dL (ref 5–40)

## 2023-04-22 LAB — PSA, TOTAL AND FREE
PSA, Free Pct: 20 %
PSA, Free: 0.18 ng/mL
Prostate Specific Ag, Serum: 0.9 ng/mL (ref 0.0–4.0)

## 2023-04-24 ENCOUNTER — Encounter: Payer: Self-pay | Admitting: Family Medicine

## 2023-04-24 NOTE — Progress Notes (Signed)
Hello Terry Pacheco,  Your lab result is normal and/or stable.Some minor variations that are not significant are commonly marked abnormal, but do not represent any medical problem for you.  Best regards, Dontrae Morini, M.D.

## 2023-05-05 DIAGNOSIS — R3914 Feeling of incomplete bladder emptying: Secondary | ICD-10-CM | POA: Diagnosis not present

## 2023-05-05 DIAGNOSIS — N401 Enlarged prostate with lower urinary tract symptoms: Secondary | ICD-10-CM | POA: Diagnosis not present

## 2023-05-05 DIAGNOSIS — R350 Frequency of micturition: Secondary | ICD-10-CM | POA: Diagnosis not present

## 2023-05-05 DIAGNOSIS — R3912 Poor urinary stream: Secondary | ICD-10-CM | POA: Diagnosis not present

## 2023-05-05 DIAGNOSIS — R351 Nocturia: Secondary | ICD-10-CM | POA: Diagnosis not present

## 2023-06-08 ENCOUNTER — Ambulatory Visit: Admitting: Family Medicine

## 2023-06-08 ENCOUNTER — Encounter: Payer: Self-pay | Admitting: Family Medicine

## 2023-06-08 VITALS — BP 137/72 | HR 73 | Temp 97.8°F | Ht 69.0 in | Wt 189.0 lb

## 2023-06-08 DIAGNOSIS — J301 Allergic rhinitis due to pollen: Secondary | ICD-10-CM

## 2023-06-08 MED ORDER — AMOXICILLIN-POT CLAVULANATE 875-125 MG PO TABS: 1.0000 | ORAL_TABLET | Freq: Two times a day (BID) | ORAL | 0 refills | Status: DC

## 2023-06-08 MED ORDER — FEXOFENADINE-PSEUDOEPHED ER 180-240 MG PO TB24: 1.0000 | ORAL_TABLET | Freq: Every evening | ORAL | 11 refills | Status: DC

## 2023-06-08 MED ORDER — BETAMETHASONE SOD PHOS & ACET 6 (3-3) MG/ML IJ SUSP
6.0000 mg | Freq: Once | INTRAMUSCULAR | Status: AC
  Administered 2023-06-08: 6 mg via INTRAMUSCULAR

## 2023-06-08 NOTE — Progress Notes (Signed)
 Chief Complaint  Patient presents with   Nasal Congestion    Congestion for 2 months. Seems to be allergies because it happens every year. Chest feels tight and sometimes he feels like he can't take a full breath. On and off phlegm.  Treating with nasal spray.     HPI  Patient presents today for Patient presents with upper respiratory congestion. Rhinorrhea that is frequently purulent. There is moderate sore throat. Patient reports coughing moderately as well.  No sputum noted. There is no fever, chills or sweats. The patient denies being short of breath. Onset was 3-5 days ago. Gradually worsening. Tried OTCs without improvement.  PMH: Smoking status noted Review of Systems  Objective: BP 137/72   Pulse 73   Temp 97.8 F (36.6 C)   Ht 5\' 9"  (1.753 m)   Wt 189 lb (85.7 kg)   SpO2 95%   BMI 27.91 kg/m  Gen: NAD, alert, cooperative with exam HEENT: NCAT, Nasal passages swollen, red TMS RED CV: RRR, good S1/S2, no murmur Resp: Bronchitis changes with scattered wheezes, non-labored Ext: No edema, warm Neuro: Alert and oriented, No gross deficits  Seasonal allergic rhinitis due to pollen -     Betamethasone Sod Phos & Acet -     Ambulatory referral to Allergy  Other orders -     Amoxicillin-Pot Clavulanate; Take 1 tablet by mouth 2 (two) times daily. Take all of this medication  Dispense: 20 tablet; Refill: 0 -     Fexofenadine-Pseudoephed ER; Take 1 tablet by mouth every evening. For allergy and congestion  Dispense: 30 tablet; Refill: 11

## 2023-06-16 ENCOUNTER — Ambulatory Visit: Payer: Self-pay

## 2023-06-16 NOTE — Telephone Encounter (Signed)
 Put him in at 4:10 today on My schedule>

## 2023-06-16 NOTE — Telephone Encounter (Signed)
 Pt declined. Scheduled for tomorrow. LS

## 2023-06-16 NOTE — Telephone Encounter (Signed)
 Copied from CRM [id number]. Topic: Clinical - Red Word Triage >> Jun 16, 2023  8:09 AM Elle L wrote: Red Word that prompted transfer to Nurse Triage: The patient had an appointment on 4/14 but states he is still having difficulty breathing and unable to catch his breath.   Chief Complaint: SOB Symptoms: SOB with exertion, chest tightness even at rest 3-4/10, some "rattle" in the chest, chest congestion, some weakness with SOB upon exertion, productive cough to clear sometimes yellow sputum, nasal congestion Frequency: continual Pertinent Negatives: Patient denies fever, lightheadedness Disposition: [] 911 / [] ED /[] Urgent Care (no appt availability in office) / [] Appointment(In office/virtual)/ []  [facility]/ [] Home Care/ [x] Refused Recommended Disposition /[] [facility]/ []  Follow-up with PCP Additional Notes: Pt reporting that he was seen by Dr. Veleta Gerold on 4/14, confirms "not feeling worse but still concerning," pt reporting that he is experiencing SOB with exertion and chest tightness and some weakness that comes with this SOB, "get where I don't have my strength," chest tightness even at rest on phone now 3-4/10 while sitting. Pt reporting that he also had nasal congestion and runny nose, productive cough to clear to yellow sputum. Pt confirms the chest discomfort lasts "15-20-30 min" at a time. Advised pt go to hospital and have another adult drive him. Pt verbalized understanding, stated that he "just got out of the ER on Saturday," stating "don't think it is cardiac," refusing hospital/ED at this time. Warm transferred to CAL to alert of refusal and see about other options for pt. Front desk CAL reporting that she will talk to pt and go from there to see if appt appropriate, merged call with pt. Advised go to hospital if any worsening, pt verbalized understanding. Please advise further recommendations as appropriate.  Reason for Disposition  [1] Chest pain lasts > 5 minutes  AND [2] age > 68  Answer Assessment - Initial Assessment Questions 1. RESPIRATORY STATUS: "Describe your breathing?" (e.g., wheezing, shortness of breath, unable to speak, severe coughing)      Short of wind with exertion 2. ONSET: "When did this breathing problem begin?"      Wednesday after exerting, seen doc since and ED Saturday 3. PATTERN "Does the difficult breathing come and go, or has it been constant since it started?"      Comes and goes 4. SEVERITY: "How bad is your breathing?" (e.g., mild, moderate, severe)    - MILD: No SOB at rest, mild SOB with walking, speaks normally in sentences, can lie down, no retractions, pulse < 100.    - MODERATE: SOB at rest, SOB with minimal exertion and prefers to sit, cannot lie down flat, speaks in phrases, mild retractions, audible wheezing, pulse 100-120.    - SEVERE: Very SOB at rest, speaks in single words, struggling to breathe, sitting hunched forward, retractions, pulse > 120      SOB with exertion only, some rattle in chest, breathing real fast and upper part of chest feels congested, chest hurting, feels like it contracts when SOB, chest tight right now sitting here, 3-4/10, able to get full breath but struggling to do so at times 5. RECURRENT SYMPTOM: "Have you had difficulty breathing before?" If Yes, ask: "When was the last time?" and "What happened that time?"      No not really except with allergy season, this is worse, like someone threw a bag of pollen in my face 6. CARDIAC HISTORY: "Do you have any history of heart disease?" (e.g., heart attack,  angina, bypass surgery, angioplasty)      HTN 7. LUNG HISTORY: "Do you have any history of lung disease?"  (e.g., pulmonary embolus, asthma, emphysema)     denies 9. OTHER SYMPTOMS: "Do you have any other symptoms? (e.g., dizziness, runny nose, cough, chest pain, fever)     When grasping breath get where don't have my strength, hard to get my breath in, worse when exert myself, coughing stuff  up sometimes clearish sometimes yellow, nasal congestion  Protocols used: Breathing Difficulty-A-AH, Chest Pain-A-AH

## 2023-06-17 ENCOUNTER — Ambulatory Visit: Admitting: Family Medicine

## 2023-06-17 ENCOUNTER — Ambulatory Visit (INDEPENDENT_AMBULATORY_CARE_PROVIDER_SITE_OTHER)

## 2023-06-17 ENCOUNTER — Encounter: Payer: Self-pay | Admitting: Family Medicine

## 2023-06-17 VITALS — BP 109/60 | HR 79 | Temp 97.9°F | Ht 69.0 in | Wt 188.0 lb

## 2023-06-17 DIAGNOSIS — R0602 Shortness of breath: Secondary | ICD-10-CM

## 2023-06-17 DIAGNOSIS — J90 Pleural effusion, not elsewhere classified: Secondary | ICD-10-CM | POA: Diagnosis not present

## 2023-06-17 DIAGNOSIS — J449 Chronic obstructive pulmonary disease, unspecified: Secondary | ICD-10-CM | POA: Diagnosis not present

## 2023-06-17 DIAGNOSIS — I350 Nonrheumatic aortic (valve) stenosis: Secondary | ICD-10-CM | POA: Diagnosis not present

## 2023-06-17 DIAGNOSIS — J4521 Mild intermittent asthma with (acute) exacerbation: Secondary | ICD-10-CM

## 2023-06-17 DIAGNOSIS — J811 Chronic pulmonary edema: Secondary | ICD-10-CM | POA: Diagnosis not present

## 2023-06-17 DIAGNOSIS — J9811 Atelectasis: Secondary | ICD-10-CM | POA: Diagnosis not present

## 2023-06-17 MED ORDER — ALBUTEROL SULFATE 1.25 MG/3ML IN NEBU: 1.0000 | INHALATION_SOLUTION | Freq: Four times a day (QID) | RESPIRATORY_TRACT | 12 refills | Status: AC | PRN

## 2023-06-17 MED ORDER — MOXIFLOXACIN HCL 400 MG PO TABS: 400.0000 mg | ORAL_TABLET | Freq: Every day | ORAL | 0 refills | Status: DC

## 2023-06-17 MED ORDER — FLUTICASONE FUROATE-VILANTEROL 200-25 MCG/ACT IN AEPB: 1.0000 | INHALATION_SPRAY | Freq: Every day | RESPIRATORY_TRACT | 11 refills | Status: AC

## 2023-06-17 MED ORDER — BETAMETHASONE SOD PHOS & ACET 6 (3-3) MG/ML IJ SUSP
6.0000 mg | Freq: Once | INTRAMUSCULAR | Status: AC
  Administered 2023-06-17: 6 mg via INTRAMUSCULAR

## 2023-06-17 NOTE — Progress Notes (Signed)
 Subjective:  Patient ID: Terry Pacheco, male    DOB: 1950-07-25  Age: 73 y.o. MRN: [id number]  CC: Shortness of Breath (Pt still having SOB. Having episodes and sounds almost like asthma attacks or pneumonia. Has to sit and catch his breathe and can take 10 minutes to a hour.  Happens even when seated but mostly with mild exertion. Some rattling. Mucous has any come up a few times. Feels stuck. Feels like headed after being SOB. )   HPI MASTON WIGHT presents for spells of losing breath. Winded after walking up a flight of stairs.Happens in episodes. Can't get air in. Runs out of breath talking. Some occur at rest. Chest feels tight. A little cough. Usually not productive. No F,C,S     04/21/2023    9:11 AM 01/28/2023    2:55 PM 10/21/2022    8:19 AM  Depression screen PHQ 2/9  Decreased Interest 0 0 0  Down, Depressed, Hopeless 0 0 0  PHQ - 2 Score 0 0 0  Altered sleeping 0    Tired, decreased energy 0    Change in appetite 0    Feeling bad or failure about yourself  0    Trouble concentrating 0    Moving slowly or fidgety/restless 0    Suicidal thoughts 0    PHQ-9 Score 0    Difficult doing work/chores Not difficult at all      History Naveed has a past medical history of Aortic stenosis, Arthritis, Cancer (HCC) (10/2016), GERD (gastroesophageal reflux disease) (07/06/2014), History of radiation therapy (12/30/2016- 02/18/2017), Hypertension, Sleep apnea, and Wears glasses.   He has a past surgical history that includes Hernia repair; vocal cord polyp ; Total hip arthroplasty (Right, 10/16/2015); polyp removal (2008); IR FLUORO GUIDE PORT INSERTION RIGHT (12/25/2016); IR US  Guide Vasc Access Right (12/25/2016); IR GASTROSTOMY TUBE MOD SED (12/25/2016); IR CV Line Injection (01/14/2017); IR REMOVAL TUN ACCESS W/ PORT W/O FL MOD SED (03/06/2017); IR GASTROSTOMY TUBE REMOVAL/REPAIR (06/05/2017); and LEFT HEART CATH AND CORONARY ANGIOGRAPHY (N/A, 12/07/2017).   His family history includes Heart  disease in his mother; Stroke in his father.He reports that he quit smoking about 6 years ago. His smoking use included cigarettes. He started smoking about 40 years ago. He has a 22.5 pack-year smoking history. He has never used smokeless tobacco. He reports that he does not currently use alcohol. He reports that he does not currently use drugs after having used the following drugs: Marijuana.    ROS Review of Systems  Constitutional:  Negative for activity change, appetite change, chills and fever.  HENT:  Positive for congestion.   Respiratory:  Positive for cough, chest tightness and shortness of breath.   Cardiovascular:  Negative for chest pain and palpitations.  Gastrointestinal:  Negative for abdominal pain.  Genitourinary: Negative.   Skin:  Negative for rash.  Neurological: Negative.     Objective:  BP 109/60   Pulse 79   Temp 97.9 F (36.6 C)   Ht 5\' 9"  (1.753 m)   Wt 188 lb (85.3 kg)   SpO2 94%   BMI 27.76 kg/m   BP Readings from Last 3 Encounters:  06/17/23 109/60  06/08/23 137/72  04/21/23 118/70    Wt Readings from Last 3 Encounters:  06/17/23 188 lb (85.3 kg)  06/08/23 189 lb (85.7 kg)  04/21/23 188 lb (85.3 kg)     Physical Exam Vitals reviewed.  Constitutional:      Appearance: He is well-developed  and normal weight. He is not ill-appearing.  HENT:     Head: Normocephalic and atraumatic.     Right Ear: External ear normal.     Left Ear: External ear normal.     Mouth/Throat:     Pharynx: No oropharyngeal exudate or posterior oropharyngeal erythema.  Eyes:     Pupils: Pupils are equal, round, and reactive to light.  Cardiovascular:     Rate and Rhythm: Normal rate and regular rhythm.     Heart sounds: Murmur heard.  Pulmonary:     Effort: No respiratory distress.     Breath sounds: Examination of the right-middle field reveals wheezing and rhonchi. Examination of the left-middle field reveals wheezing and rhonchi. Wheezing and rhonchi present.   Chest:     Chest wall: No mass or deformity.  Musculoskeletal:     Cervical back: Normal range of motion and neck supple.  Skin:    General: Skin is warm and dry.  Neurological:     Mental Status: He is alert and oriented to person, place, and time.      Assessment & Plan:  Aortic valve stenosis, etiology of cardiac valve disease unspecified  Murmur, cardiac  Shortness of breath -     DG Chest 2 View; Future -     CBC with Differential/Platelet -     CMP14+EGFR -     Brain natriuretic peptide -     D-dimer, quantitative -     Betamethasone  Sod Phos & Acet  Mild intermittent asthmatic bronchitis with acute exacerbation  Other orders -     Albuterol  Sulfate; Take 3 mLs (1.25 mg total) by nebulization every 6 (six) hours as needed for wheezing.  Dispense: 75 mL; Refill: 12 -     Fluticasone  Furoate-Vilanterol; Inhale 1 puff into the lungs daily.  Dispense: 1 each; Refill: 11 -     Moxifloxacin  HCl; Take 1 tablet (400 mg total) by mouth daily.  Dispense: 10 tablet; Refill: 0     Follow-up: No follow-ups on file.  Roise Cleaver, M.D.

## 2023-06-18 LAB — CMP14+EGFR
ALT: 11 IU/L (ref 0–44)
AST: 18 IU/L (ref 0–40)
Albumin: 4.2 g/dL (ref 3.8–4.8)
Alkaline Phosphatase: 128 IU/L — ABNORMAL HIGH (ref 44–121)
BUN/Creatinine Ratio: 15 (ref 10–24)
BUN: 14 mg/dL (ref 8–27)
Bilirubin Total: 0.7 mg/dL (ref 0.0–1.2)
CO2: 23 mmol/L (ref 20–29)
Calcium: 8.9 mg/dL (ref 8.6–10.2)
Chloride: 97 mmol/L (ref 96–106)
Creatinine, Ser: 0.94 mg/dL (ref 0.76–1.27)
Globulin, Total: 2.1 g/dL (ref 1.5–4.5)
Glucose: 102 mg/dL — ABNORMAL HIGH (ref 70–99)
Potassium: 4.2 mmol/L (ref 3.5–5.2)
Sodium: 134 mmol/L (ref 134–144)
Total Protein: 6.3 g/dL (ref 6.0–8.5)
eGFR: 86 mL/min/{1.73_m2} (ref >59)

## 2023-06-18 LAB — CBC WITH DIFFERENTIAL/PLATELET
Basophils Absolute: 0 10*3/uL (ref 0.0–0.2)
Basos: 0 %
EOS (ABSOLUTE): 0.3 10*3/uL (ref 0.0–0.4)
Eos: 4 %
Hematocrit: 42.6 % (ref 37.5–51.0)
Hemoglobin: 14.3 g/dL (ref 13.0–17.7)
Immature Grans (Abs): 0 10*3/uL (ref 0.0–0.1)
Immature Granulocytes: 0 %
Lymphocytes Absolute: 1 10*3/uL (ref 0.7–3.1)
Lymphs: 14 %
MCH: 31 pg (ref 26.6–33.0)
MCHC: 33.6 g/dL (ref 31.5–35.7)
MCV: 92 fL (ref 79–97)
Monocytes Absolute: 0.6 10*3/uL (ref 0.1–0.9)
Monocytes: 9 %
Neutrophils Absolute: 5 10*3/uL (ref 1.4–7.0)
Neutrophils: 73 %
Platelets: 173 10*3/uL (ref 150–450)
RBC: 4.62 x10E6/uL (ref 4.14–5.80)
RDW: 12.8 % (ref 11.6–15.4)
WBC: 7 10*3/uL (ref 3.4–10.8)

## 2023-06-18 LAB — BRAIN NATRIURETIC PEPTIDE: BNP: 229.7 pg/mL — ABNORMAL HIGH (ref 0.0–100.0)

## 2023-06-18 LAB — D-DIMER, QUANTITATIVE: D-DIMER: 0.98 mg{FEU}/L — ABNORMAL HIGH (ref 0.00–0.49)

## 2023-06-22 ENCOUNTER — Other Ambulatory Visit: Payer: Self-pay | Admitting: Family Medicine

## 2023-06-22 DIAGNOSIS — I509 Heart failure, unspecified: Secondary | ICD-10-CM

## 2023-06-22 DIAGNOSIS — R011 Cardiac murmur, unspecified: Secondary | ICD-10-CM

## 2023-06-22 DIAGNOSIS — I2510 Atherosclerotic heart disease of native coronary artery without angina pectoris: Secondary | ICD-10-CM

## 2023-06-22 DIAGNOSIS — R9439 Abnormal result of other cardiovascular function study: Secondary | ICD-10-CM

## 2023-06-22 DIAGNOSIS — I350 Nonrheumatic aortic (valve) stenosis: Secondary | ICD-10-CM

## 2023-06-27 DIAGNOSIS — J439 Emphysema, unspecified: Secondary | ICD-10-CM | POA: Diagnosis not present

## 2023-06-27 DIAGNOSIS — Z743 Need for continuous supervision: Secondary | ICD-10-CM | POA: Diagnosis not present

## 2023-06-27 DIAGNOSIS — N401 Enlarged prostate with lower urinary tract symptoms: Secondary | ICD-10-CM | POA: Diagnosis not present

## 2023-06-27 DIAGNOSIS — I272 Pulmonary hypertension, unspecified: Secondary | ICD-10-CM | POA: Diagnosis not present

## 2023-06-27 DIAGNOSIS — Z7982 Long term (current) use of aspirin: Secondary | ICD-10-CM | POA: Diagnosis not present

## 2023-06-27 DIAGNOSIS — R911 Solitary pulmonary nodule: Secondary | ICD-10-CM | POA: Diagnosis not present

## 2023-06-27 DIAGNOSIS — J9601 Acute respiratory failure with hypoxia: Secondary | ICD-10-CM | POA: Diagnosis not present

## 2023-06-27 DIAGNOSIS — E871 Hypo-osmolality and hyponatremia: Secondary | ICD-10-CM | POA: Diagnosis not present

## 2023-06-27 DIAGNOSIS — Z9981 Dependence on supplemental oxygen: Secondary | ICD-10-CM | POA: Diagnosis not present

## 2023-06-27 DIAGNOSIS — I350 Nonrheumatic aortic (valve) stenosis: Secondary | ICD-10-CM | POA: Diagnosis not present

## 2023-06-27 DIAGNOSIS — N400 Enlarged prostate without lower urinary tract symptoms: Secondary | ICD-10-CM | POA: Diagnosis not present

## 2023-06-27 DIAGNOSIS — N138 Other obstructive and reflux uropathy: Secondary | ICD-10-CM | POA: Diagnosis not present

## 2023-06-27 DIAGNOSIS — R069 Unspecified abnormalities of breathing: Secondary | ICD-10-CM | POA: Diagnosis not present

## 2023-06-27 DIAGNOSIS — E8770 Fluid overload, unspecified: Secondary | ICD-10-CM | POA: Diagnosis not present

## 2023-06-27 DIAGNOSIS — E785 Hyperlipidemia, unspecified: Secondary | ICD-10-CM | POA: Diagnosis not present

## 2023-06-27 DIAGNOSIS — Z888 Allergy status to other drugs, medicaments and biological substances status: Secondary | ICD-10-CM | POA: Diagnosis not present

## 2023-06-27 DIAGNOSIS — I083 Combined rheumatic disorders of mitral, aortic and tricuspid valves: Secondary | ICD-10-CM | POA: Diagnosis not present

## 2023-06-27 DIAGNOSIS — R339 Retention of urine, unspecified: Secondary | ICD-10-CM | POA: Diagnosis not present

## 2023-06-27 DIAGNOSIS — I5031 Acute diastolic (congestive) heart failure: Secondary | ICD-10-CM | POA: Diagnosis not present

## 2023-06-27 DIAGNOSIS — J441 Chronic obstructive pulmonary disease with (acute) exacerbation: Secondary | ICD-10-CM | POA: Diagnosis not present

## 2023-06-27 DIAGNOSIS — Z1152 Encounter for screening for COVID-19: Secondary | ICD-10-CM | POA: Diagnosis not present

## 2023-06-27 DIAGNOSIS — Z79899 Other long term (current) drug therapy: Secondary | ICD-10-CM | POA: Diagnosis not present

## 2023-06-27 DIAGNOSIS — G4733 Obstructive sleep apnea (adult) (pediatric): Secondary | ICD-10-CM | POA: Diagnosis not present

## 2023-06-27 DIAGNOSIS — I509 Heart failure, unspecified: Secondary | ICD-10-CM | POA: Diagnosis not present

## 2023-06-27 DIAGNOSIS — R0689 Other abnormalities of breathing: Secondary | ICD-10-CM | POA: Diagnosis not present

## 2023-06-27 DIAGNOSIS — F17210 Nicotine dependence, cigarettes, uncomplicated: Secondary | ICD-10-CM | POA: Diagnosis not present

## 2023-06-27 DIAGNOSIS — R0602 Shortness of breath: Secondary | ICD-10-CM | POA: Diagnosis not present

## 2023-06-27 DIAGNOSIS — R338 Other retention of urine: Secondary | ICD-10-CM | POA: Diagnosis not present

## 2023-06-27 DIAGNOSIS — I110 Hypertensive heart disease with heart failure: Secondary | ICD-10-CM | POA: Diagnosis not present

## 2023-06-27 DIAGNOSIS — J811 Chronic pulmonary edema: Secondary | ICD-10-CM | POA: Diagnosis not present

## 2023-06-27 DIAGNOSIS — Z931 Gastrostomy status: Secondary | ICD-10-CM | POA: Diagnosis not present

## 2023-06-27 DIAGNOSIS — R609 Edema, unspecified: Secondary | ICD-10-CM | POA: Diagnosis not present

## 2023-06-27 DIAGNOSIS — I499 Cardiac arrhythmia, unspecified: Secondary | ICD-10-CM | POA: Diagnosis not present

## 2023-06-27 DIAGNOSIS — F32A Depression, unspecified: Secondary | ICD-10-CM | POA: Diagnosis not present

## 2023-06-27 DIAGNOSIS — I2510 Atherosclerotic heart disease of native coronary artery without angina pectoris: Secondary | ICD-10-CM | POA: Diagnosis not present

## 2023-06-27 DIAGNOSIS — Z9989 Dependence on other enabling machines and devices: Secondary | ICD-10-CM | POA: Diagnosis not present

## 2023-06-27 DIAGNOSIS — I5020 Unspecified systolic (congestive) heart failure: Secondary | ICD-10-CM | POA: Diagnosis not present

## 2023-06-29 ENCOUNTER — Ambulatory Visit: Admitting: Family Medicine

## 2023-07-03 ENCOUNTER — Ambulatory Visit: Payer: Self-pay | Admitting: Cardiology

## 2023-07-10 DIAGNOSIS — Z1330 Encounter for screening examination for mental health and behavioral disorders, unspecified: Secondary | ICD-10-CM | POA: Diagnosis not present

## 2023-07-10 DIAGNOSIS — C099 Malignant neoplasm of tonsil, unspecified: Secondary | ICD-10-CM | POA: Diagnosis not present

## 2023-07-10 DIAGNOSIS — I10 Essential (primary) hypertension: Secondary | ICD-10-CM | POA: Diagnosis not present

## 2023-07-10 DIAGNOSIS — I350 Nonrheumatic aortic (valve) stenosis: Secondary | ICD-10-CM | POA: Diagnosis not present

## 2023-07-10 DIAGNOSIS — F411 Generalized anxiety disorder: Secondary | ICD-10-CM | POA: Diagnosis not present

## 2023-07-10 DIAGNOSIS — K219 Gastro-esophageal reflux disease without esophagitis: Secondary | ICD-10-CM | POA: Diagnosis not present

## 2023-07-10 DIAGNOSIS — E039 Hypothyroidism, unspecified: Secondary | ICD-10-CM | POA: Diagnosis not present

## 2023-07-10 DIAGNOSIS — I2510 Atherosclerotic heart disease of native coronary artery without angina pectoris: Secondary | ICD-10-CM | POA: Diagnosis not present

## 2023-07-10 DIAGNOSIS — Q676 Pectus excavatum: Secondary | ICD-10-CM | POA: Diagnosis not present

## 2023-07-10 DIAGNOSIS — E785 Hyperlipidemia, unspecified: Secondary | ICD-10-CM | POA: Diagnosis not present

## 2023-07-10 DIAGNOSIS — J42 Unspecified chronic bronchitis: Secondary | ICD-10-CM | POA: Diagnosis not present

## 2023-07-10 DIAGNOSIS — I509 Heart failure, unspecified: Secondary | ICD-10-CM | POA: Diagnosis not present

## 2023-07-10 DIAGNOSIS — N401 Enlarged prostate with lower urinary tract symptoms: Secondary | ICD-10-CM | POA: Diagnosis not present

## 2023-07-10 DIAGNOSIS — E876 Hypokalemia: Secondary | ICD-10-CM | POA: Diagnosis not present

## 2023-07-14 DIAGNOSIS — R3912 Poor urinary stream: Secondary | ICD-10-CM | POA: Diagnosis not present

## 2023-07-14 DIAGNOSIS — R3914 Feeling of incomplete bladder emptying: Secondary | ICD-10-CM | POA: Diagnosis not present

## 2023-07-14 DIAGNOSIS — N401 Enlarged prostate with lower urinary tract symptoms: Secondary | ICD-10-CM | POA: Diagnosis not present

## 2023-07-14 DIAGNOSIS — R351 Nocturia: Secondary | ICD-10-CM | POA: Diagnosis not present

## 2023-07-22 DIAGNOSIS — I509 Heart failure, unspecified: Secondary | ICD-10-CM | POA: Diagnosis not present

## 2023-07-22 DIAGNOSIS — Z8249 Family history of ischemic heart disease and other diseases of the circulatory system: Secondary | ICD-10-CM | POA: Diagnosis not present

## 2023-07-22 DIAGNOSIS — J9601 Acute respiratory failure with hypoxia: Secondary | ICD-10-CM | POA: Diagnosis not present

## 2023-07-22 DIAGNOSIS — I2510 Atherosclerotic heart disease of native coronary artery without angina pectoris: Secondary | ICD-10-CM | POA: Diagnosis not present

## 2023-07-22 DIAGNOSIS — I10 Essential (primary) hypertension: Secondary | ICD-10-CM | POA: Diagnosis not present

## 2023-07-22 DIAGNOSIS — E8770 Fluid overload, unspecified: Secondary | ICD-10-CM | POA: Diagnosis not present

## 2023-07-22 DIAGNOSIS — I2584 Coronary atherosclerosis due to calcified coronary lesion: Secondary | ICD-10-CM | POA: Diagnosis not present

## 2023-07-22 DIAGNOSIS — E785 Hyperlipidemia, unspecified: Secondary | ICD-10-CM | POA: Diagnosis not present

## 2023-07-22 DIAGNOSIS — I350 Nonrheumatic aortic (valve) stenosis: Secondary | ICD-10-CM | POA: Diagnosis not present

## 2023-07-23 ENCOUNTER — Telehealth: Payer: Self-pay | Admitting: Family Medicine

## 2023-07-23 NOTE — Telephone Encounter (Unsigned)
 Copied from CRM [id number]. Topic: General - Billing Inquiry >> Jul 23, 2023  9:35 AM Hassie Lint wrote: Reason for CRM: Patient states he received a bill from his insurance that a injection wasn't covered from 06/17/23. He called his insurance and they advised the injection was missing code: [id number]. Insurance says needs to be updated an resubmitted.  Patient can be reached at [phone]

## 2023-07-27 DIAGNOSIS — I350 Nonrheumatic aortic (valve) stenosis: Secondary | ICD-10-CM | POA: Diagnosis not present

## 2023-07-27 DIAGNOSIS — C099 Malignant neoplasm of tonsil, unspecified: Secondary | ICD-10-CM | POA: Diagnosis not present

## 2023-07-27 DIAGNOSIS — J441 Chronic obstructive pulmonary disease with (acute) exacerbation: Secondary | ICD-10-CM | POA: Diagnosis not present

## 2023-07-27 DIAGNOSIS — I10 Essential (primary) hypertension: Secondary | ICD-10-CM | POA: Diagnosis not present

## 2023-07-27 DIAGNOSIS — I509 Heart failure, unspecified: Secondary | ICD-10-CM | POA: Diagnosis not present

## 2023-08-03 DIAGNOSIS — I350 Nonrheumatic aortic (valve) stenosis: Secondary | ICD-10-CM | POA: Diagnosis not present

## 2023-08-03 DIAGNOSIS — R748 Abnormal levels of other serum enzymes: Secondary | ICD-10-CM | POA: Diagnosis not present

## 2023-08-03 DIAGNOSIS — L989 Disorder of the skin and subcutaneous tissue, unspecified: Secondary | ICD-10-CM | POA: Diagnosis not present

## 2023-08-03 DIAGNOSIS — I509 Heart failure, unspecified: Secondary | ICD-10-CM | POA: Diagnosis not present

## 2023-08-03 DIAGNOSIS — Z136 Encounter for screening for cardiovascular disorders: Secondary | ICD-10-CM | POA: Diagnosis not present

## 2023-08-03 DIAGNOSIS — I10 Essential (primary) hypertension: Secondary | ICD-10-CM | POA: Diagnosis not present

## 2023-08-03 DIAGNOSIS — R21 Rash and other nonspecific skin eruption: Secondary | ICD-10-CM | POA: Diagnosis not present

## 2023-08-03 DIAGNOSIS — Z87891 Personal history of nicotine dependence: Secondary | ICD-10-CM | POA: Diagnosis not present

## 2023-08-06 DIAGNOSIS — Z0000 Encounter for general adult medical examination without abnormal findings: Secondary | ICD-10-CM | POA: Diagnosis not present

## 2023-08-07 ENCOUNTER — Encounter: Payer: Self-pay | Admitting: Internal Medicine

## 2023-08-07 ENCOUNTER — Ambulatory Visit: Payer: Self-pay | Admitting: Internal Medicine

## 2023-08-07 ENCOUNTER — Ambulatory Visit (INDEPENDENT_AMBULATORY_CARE_PROVIDER_SITE_OTHER)

## 2023-08-07 VITALS — BP 122/60 | HR 81 | Temp 98.1°F | Ht 69.0 in | Wt 171.4 lb

## 2023-08-07 DIAGNOSIS — J449 Chronic obstructive pulmonary disease, unspecified: Secondary | ICD-10-CM | POA: Insufficient documentation

## 2023-08-07 DIAGNOSIS — N400 Enlarged prostate without lower urinary tract symptoms: Secondary | ICD-10-CM | POA: Diagnosis not present

## 2023-08-07 DIAGNOSIS — J9601 Acute respiratory failure with hypoxia: Secondary | ICD-10-CM

## 2023-08-07 DIAGNOSIS — Z87891 Personal history of nicotine dependence: Secondary | ICD-10-CM

## 2023-08-07 DIAGNOSIS — I509 Heart failure, unspecified: Secondary | ICD-10-CM | POA: Diagnosis not present

## 2023-08-07 NOTE — Progress Notes (Unsigned)
 Terry Pacheco, male    DOB: 31-Oct-1950   MRN: [id number]   Brief patient profile:  73 yowm quit smoking 2018 with ethroat Ca s/p radiation/ chemo Donalee Fruits now ENT of record)  at [facility]  referred to pulmonary clinic 08/07/2023 by Vedia Geralds  for doe      Pt not previously seen by [facility] service.     History of Present Illness  08/07/2023  Pulmonary/ 1st office eval/[facility] Holzer  BREO  with BOO so no LAMA  Chief Complaint  Patient presents with   Follow-up    CHF and COPD Consult from [facility]  Dyspnea:  baseline before admit = down the road from his house to end of road  about a half mile x one half mile and then  back up hill uphill now goes half way and almost  Cough: improving but was heavy in am before abx  Sleep: 30 degrees electric bed one pillow x years can't breath flat  SABA use: not needing  hfa at all  02 WUJ:WJXB  Last prednisone  w/in a week    No obvious day to day or daytime pattern/variability or assoc excess/ purulent sputum or mucus plugs or hemoptysis or cp or chest tightness, subjective wheeze or overt sinus or hb symptoms.    Also denies any obvious fluctuation of symptoms with weather or environmental changes or other aggravating or alleviating factors except as outlined above   No unusual exposure hx or h/o childhood pna/ asthma or knowledge of premature birth.  Current Allergies, Complete Past Medical History, Past Surgical History, Family History, and Social History were reviewed in [employer] record.  ROS  The following are not active complaints unless bolded Hoarseness, sore throat, dysphagia, dental problems, itching, sneezing,  nasal congestion x age 73 or discharge of excess mucus or purulent secretions, ear ache,   fever, chills, sweats, unintended wt loss or wt gain, classically pleuritic or exertional cp,  orthopnea pnd or arm/hand swelling  or leg swelling resolved p 7 liters off, presyncope, palpitations, abdominal pain, anorexia, nausea,  vomiting, diarrhea  or change in bowel habits or change in bladder habits, change in stools or change in urine, dysuria, hematuria,  rash, arthralgias, visual complaints, headache, numbness, weakness or ataxia or problems with walking or coordination,  change in mood or  memory.             Outpatient Medications Prior to Visit  Medication Sig Dispense Refill   acetaminophen  (TYLENOL ) 500 MG tablet Take 1,000 mg by mouth daily as needed for moderate pain or headache.     albuterol  (ACCUNEB ) 1.25 MG/3ML nebulizer solution Take 3 mLs (1.25 mg total) by nebulization every 6 (six) hours as needed for wheezing. 75 mL 12   amLODipine  (NORVASC ) 10 MG tablet TAKE 1 TABLET EVERY DAY FOR FOR BLOOD PRESSURE 90 tablet 3   amoxicillin -clavulanate (AUGMENTIN ) 875-125 MG tablet Take 1 tablet by mouth 2 (two) times daily. Take all of this medication 20 tablet 0   aspirin  EC 81 MG tablet Take 81 mg by mouth daily.     atorvastatin  (LIPITOR) 40 MG tablet Take 1 tablet (40 mg total) by mouth daily. 90 tablet 3   B Complex CAPS TAKE 1 CAPSULE BY MOUTH EVERY DAY 100 capsule 2   calcium  carbonate (TUMS - DOSED IN MG ELEMENTAL CALCIUM ) 500 MG chewable tablet Chew 2 tablets by mouth daily as needed for indigestion or heartburn.     escitalopram  (LEXAPRO ) 20 MG tablet  TAKE 1 TABLET BY MOUTH EVERY DAY 90 tablet 0   ezetimibe  (ZETIA ) 10 MG tablet Take 1 tablet (10 mg total) by mouth daily. 90 tablet 0   fexofenadine -pseudoephedrine  (ALLEGRA-D 24) 180-240 MG 24 hr tablet Take 1 tablet by mouth every evening. For allergy and congestion 30 tablet 11   finasteride  (PROSCAR ) 5 MG tablet Take 1 tablet (5 mg total) by mouth daily. For urine flow 90 tablet 3   fluticasone  furoate-vilanterol (BREO ELLIPTA ) 200-25 MCG/ACT AEPB Inhale 1 puff into the lungs daily. 1 each 11   Glucosamine 500 MG CAPS Take by mouth.     moxifloxacin  (AVELOX ) 400 MG tablet Take 1 tablet (400 mg total) by mouth daily. 10 tablet 0   omeprazole   (PRILOSEC) 20 MG capsule TAKE 1 CAPSULE BY MOUTH EVERY DAY 90 capsule 3   tamsulosin  (FLOMAX ) 0.4 MG CAPS capsule Take 2 capsules (0.8 mg total) by mouth at bedtime. For urine flow and prostate 180 capsule 3   Vitamin D , Ergocalciferol , (DRISDOL ) 1.25 MG (50000 UNIT) CAPS capsule TAKE 1 CAPSULE (50,000 UNITS TOTAL) BY MOUTH EVERY 7 (SEVEN) DAYS 13 capsule 3   fluticasone  (FLONASE ) 50 MCG/ACT nasal spray SPRAY 2 SPRAYS INTO EACH NOSTRIL EVERY DAY (Patient not taking: Reported on 06/08/2023) 48 g 0   No facility-administered medications prior to visit.    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    Hypertension    Sleep apnea    does not use CPAP   Wears glasses       Objective:     BP 122/60 (BP Location: Left Arm, Patient Position: Sitting, Cuff Size: Normal)   Pulse 81   Temp 98.1 F (36.7 C) (Temporal)   Ht 5' 9 (1.753 m)   Wt 171 lb 6.4 oz (77.7 kg)   SpO2 93%   BMI 25.31 kg/m   SpO2: 93 % pleasant amb elderly wm nad    HEENT : Oropharynx  clear   Nasal turbinates mod edema    NECK :  without  apparent JVD/ palpable Nodes/TM    LUNGS: no acc muscle use,  Mild barrel  contour chest wall with pectus carinatum mod severe  bilateral  Distant bs s audible wheeze and  without cough on insp or exp maneuvers  and mild  Hyperresonant  to  percussion bilaterally     CV:  RRR  no s3 or murmur or increase in P2, and no edema   ABD:  soft and nontender with pos end  insp Hoover's  in the supine position.  No  bruits or organomegaly appreciated   MS:  Nl gait/ ext warm without deformities Or obvious joint restrictions  calf tenderness, cyanosis   Pos very mild clubbing     SKIN: warm and dry without lesions    NEURO:  alert, approp, nl sensorium with  no motor or cerebellar deficits apparent.     CXR PA and Lateral:   08/07/2023 :    I personally reviewed  images and impression is as follows:     Marked pectus, mod copd no acute findings     Assessment   No problem-specific Assessment & Plan notes found for this encounter.     Vernestine Gondola, MD 08/07/2023

## 2023-08-07 NOTE — Patient Instructions (Addendum)
 Plan A = Automatic = Always=    Breo 100 one click each  am first thing   Plan B = Backup (to supplement plan A, not to replace it) Only use your albuterol  inhaler as a rescue medication to be used if you can't catch your breath by resting or doing a relaxed purse lip breathing pattern.  - The less you use it, the better it will work when you need it. - Ok to use the inhaler up to 2 puffs  every 4 hours if you must but call for appointment if use goes up over your usual need - Don't leave home without it !!  (think of it like the spare tire for your car)   Plan C = Crisis (instead of Plan B but only if Plan B stops working) - only use your albuterol  nebulizer if you first try Plan B and it fails to help > ok to use the nebulizer up to every 4 hours but if start needing it regularly call for immediate appointment   Please schedule a follow up visit in 3 months but call sooner if needed with PFTs on return  either GDS OR RDS

## 2023-08-08 NOTE — Assessment & Plan Note (Signed)
 Quit smoking  2018  -  06/27/23  echo at [facility] shows EF of 40 to 45% with decreased left ventricular systolic function. There was mild to moderate aortic regurgitation as well as aortic sclerosis -  08/07/2023   Walked on RA  x  3  lap(s) =  approx 750  ft  @ mod pace, stopped due to end of study with lowest 02 sats 93% and no sob   - PFT'S ordered 08/07/2023 >>>    Copd appears to be the less of his problems and he can't likely tolerate LAMA due to bladder outlet problems so rec stay on BREO and use saba prn and f/u with pfts  - The proper method of use, as well as anticipated side effects, of of both hfa and dpi/elipta  inhaler were discussed and demonstrated to the patient using teach back method and empty devices          Each maintenance medication was reviewed in detail including emphasizing most importantly the difference between maintenance and prns and under what circumstances the prns are to be triggered using an action plan format where appropriate.  Total time for H and P, chart review, counseling, reviewing hfa/ dpi  device(s) and generating customized AVS unique to this office visit / same day charting = 50 min

## 2023-08-10 ENCOUNTER — Ambulatory Visit: Payer: Self-pay | Admitting: Internal Medicine

## 2023-08-11 NOTE — Progress Notes (Signed)
Patient has reviewed results and recommendations via My Chart.

## 2023-08-13 DIAGNOSIS — I352 Nonrheumatic aortic (valve) stenosis with insufficiency: Secondary | ICD-10-CM | POA: Diagnosis not present

## 2023-08-13 DIAGNOSIS — I340 Nonrheumatic mitral (valve) insufficiency: Secondary | ICD-10-CM | POA: Diagnosis not present

## 2023-08-13 DIAGNOSIS — I509 Heart failure, unspecified: Secondary | ICD-10-CM | POA: Diagnosis not present

## 2023-08-13 DIAGNOSIS — I361 Nonrheumatic tricuspid (valve) insufficiency: Secondary | ICD-10-CM | POA: Diagnosis not present

## 2023-08-13 DIAGNOSIS — I350 Nonrheumatic aortic (valve) stenosis: Secondary | ICD-10-CM | POA: Diagnosis not present

## 2023-08-17 ENCOUNTER — Encounter (HOSPITAL_COMMUNITY)
Admission: RE | Admit: 2023-08-17 | Discharge: 2023-08-17 | Disposition: A | Discharge status: Home / Self Care | Source: Ambulatory Visit

## 2023-08-17 VITALS — Ht 65.5 in | Wt 167.1 lb

## 2023-08-17 DIAGNOSIS — I5022 Chronic systolic (congestive) heart failure: Secondary | ICD-10-CM | POA: Insufficient documentation

## 2023-08-17 NOTE — Progress Notes (Signed)
 Pulmonary Individual Treatment Plan  Patient Details  Name: Terry Pacheco MRN: [id number] Date of Birth: Nov 09, 1950 Referring Provider:   Flowsheet Row PULMONARY REHAB OTHER RESP ORIENTATION from 08/17/2023 in [facility] CARDIAC REHABILITATION  Referring Provider Freddy Blinks MD    Initial Encounter Date:  Flowsheet Row PULMONARY REHAB OTHER RESP ORIENTATION from 08/17/2023 in [place] [facility] CARDIAC REHABILITATION  Date 08/17/23    Visit Diagnosis: Heart failure, chronic systolic (HCC)  Patient's Home Medications on Admission:   Current Outpatient Medications:    acetaminophen  (TYLENOL ) 500 MG tablet, Take 1,000 mg by mouth daily as needed for moderate pain or headache., Disp: , Rfl:    albuterol  (ACCUNEB ) 1.25 MG/3ML nebulizer solution, Take 3 mLs (1.25 mg total) by nebulization every 6 (six) hours as needed for wheezing., Disp: 75 mL, Rfl: 12   amLODipine  (NORVASC ) 10 MG tablet, TAKE 1 TABLET EVERY DAY FOR FOR BLOOD PRESSURE, Disp: 90 tablet, Rfl: 3   aspirin  EC 81 MG tablet, Take 81 mg by mouth daily., Disp: , Rfl:    atorvastatin  (LIPITOR) 40 MG tablet, Take 1 tablet (40 mg total) by mouth daily., Disp: 90 tablet, Rfl: 3   B Complex CAPS, TAKE 1 CAPSULE BY MOUTH EVERY DAY, Disp: 100 capsule, Rfl: 2   calcium  carbonate (TUMS - DOSED IN MG ELEMENTAL CALCIUM ) 500 MG chewable tablet, Chew 2 tablets by mouth daily as needed for indigestion or heartburn., Disp: , Rfl:    empagliflozin (JARDIANCE) 10 MG TABS tablet, Take 1 tablet by mouth daily., Disp: , Rfl:    escitalopram  (LEXAPRO ) 20 MG tablet, TAKE 1 TABLET BY MOUTH EVERY DAY, Disp: 90 tablet, Rfl: 0   ezetimibe  (ZETIA ) 10 MG tablet, Take 1 tablet (10 mg total) by mouth daily., Disp: 90 tablet, Rfl: 0   fluticasone  furoate-vilanterol (BREO ELLIPTA ) 200-25 MCG/ACT AEPB, Inhale 1 puff into the lungs daily., Disp: 1 each, Rfl: 11   Glucosamine 500 MG CAPS, Take by mouth., Disp: , Rfl:    omeprazole  (PRILOSEC) 20 MG capsule, TAKE 1 CAPSULE  BY MOUTH EVERY DAY, Disp: 90 capsule, Rfl: 3   spironolactone (ALDACTONE) 25 MG tablet, Take 12.5 mg by mouth daily., Disp: , Rfl:    Vitamin D , Ergocalciferol , (DRISDOL ) 1.25 MG (50000 UNIT) CAPS capsule, TAKE 1 CAPSULE (50,000 UNITS TOTAL) BY MOUTH EVERY 7 (SEVEN) DAYS, Disp: 13 capsule, Rfl: 3   amoxicillin -clavulanate (AUGMENTIN ) 875-125 MG tablet, Take 1 tablet by mouth 2 (two) times daily. Take all of this medication (Patient not taking: Reported on 08/17/2023), Disp: 20 tablet, Rfl: 0   fexofenadine -pseudoephedrine  (ALLEGRA-D 24) 180-240 MG 24 hr tablet, Take 1 tablet by mouth every evening. For allergy and congestion (Patient not taking: Reported on 08/17/2023), Disp: 30 tablet, Rfl: 11   finasteride  (PROSCAR ) 5 MG tablet, Take 1 tablet (5 mg total) by mouth daily. For urine flow (Patient not taking: Reported on 08/17/2023), Disp: 90 tablet, Rfl: 3   moxifloxacin  (AVELOX ) 400 MG tablet, Take 1 tablet (400 mg total) by mouth daily. (Patient not taking: Reported on 08/17/2023), Disp: 10 tablet, Rfl: 0   tamsulosin  (FLOMAX ) 0.4 MG CAPS capsule, Take 2 capsules (0.8 mg total) by mouth at bedtime. 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    Hypertension    Sleep apnea    does not use CPAP   Wears glasses     Tobacco Use: Social History   Tobacco Use  Smoking Status Former   Current packs/day: 0.00    Average packs/day: 0.5 packs/day for 45.0 years (22.5 ttl pk-yrs)   Types: Cigarettes   Start date: 01/12/1983   Quit date: 11/17/2016   Years since quitting: 6.7  Smokeless Tobacco Never    Labs: Review Flowsheet  More data exists      Latest Ref Rng & Units 04/18/2021 10/21/2021 04/22/2022 10/21/2022 04/21/2023  Labs for ITP Cardiac and Pulmonary Rehab  Cholestrol 100 - 199 mg/dL 892  889  880  882  895   LDL (calc) 0 - 99 mg/dL 49  45  49  44  44   HDL-C >39 mg/dL 44  54  58  61  47   Trlycerides 0 - 149 mg/dL 65  39  51  51  56     Capillary Blood Glucose: Lab Results  Component Value Date   GLUCAP 93 12/08/2017   GLUCAP 106 (H) 04/29/2017   GLUCAP 105 (H) 12/02/2016     Pulmonary Assessment Scores:  Pulmonary Assessment Scores     Row Name 08/17/23 1008         ADL UCSD   ADL Phase Entry     SOB Score total 31     Rest 0     Walk 1     Stairs 3     Bath 2     Dress 1     Shop 2       CAT Score   CAT Score 15       mMRC Score   mMRC Score 2       UCSD: Self-administered rating of dyspnea associated with activities of daily living (ADLs) 6-point scale (0 = not at all to 5 = maximal or unable to do because of breathlessness)  Scoring Scores range from 0 to 120.  Minimally important difference is 5 units  CAT: CAT can identify the health impairment of COPD patients and is better correlated with disease progression.  CAT has a scoring range of zero to 40. The CAT score is classified into four groups of low (less than 10), medium (10 - 20), high (21-30) and very high (31-40) based on the impact level of disease on health status. A CAT score over 10 suggests significant symptoms.  A worsening CAT score could be explained by an exacerbation, poor medication adherence, poor inhaler technique, or progression of COPD or comorbid conditions.  CAT MCID is 2 points  mMRC: mMRC (Modified Medical Research Council) Dyspnea Scale is used to assess the degree of  baseline functional disability in patients of respiratory disease due to dyspnea. No minimal important difference is established. A decrease in score of 1 point or greater is considered a positive change.   Pulmonary Function Assessment:   Exercise Target Goals: Exercise Program Goal: Individual exercise prescription set using results from initial 6 min walk test and THRR while considering  patient's activity barriers and safety.  Exercise Prescription Goal: Initial exercise prescription builds to 30-45 minutes a day of aerobic activity, 2-3 days per week.  Home exercise guidelines will be given to patient during program as part of exercise prescription that the participant will acknowledge.  Activity Barriers & Risk Stratification:  Activity Barriers & Cardiac Risk Stratification - 08/17/23 0832       Activity Barriers & Cardiac Risk Stratification   Activity Barriers Shortness of Breath;Deconditioning;Muscular Weakness;Right Hip Replacement;Arthritis;Balance Concerns   TRHR 2017.   Cardiac Risk Stratification Moderate          6 Minute Walk:  6 Minute Walk     Row Name 08/17/23 0939         6 Minute Walk   Phase Initial     Distance 1190 feet     Walk Time 6 minutes     # of Rest Breaks 0     MPH 2.25     METS 2.28     RPE 12     Perceived Dyspnea  1     VO2 Peak 7.96     Symptoms No     Resting HR 81 bpm     Resting BP 90/60     Resting Oxygen Saturation  92 %     Exercise Oxygen Saturation  during 6 min walk 88 %     Max Ex. HR 92 bpm     Max Ex. BP 106/62     2 Minute Post BP 98/60       Interval HR   1 Minute HR 88     2 Minute HR 99     3 Minute HR 98     4 Minute HR 92     5 Minute HR 92     6 Minute HR 92     2 Minute Post HR 83     Interval Heart Rate? Yes       Interval Oxygen   Interval Oxygen? Yes     Baseline Oxygen Saturation % 92 %     1 Minute Oxygen Saturation % 92 %     1 Minute Liters of Oxygen 0 L     2 Minute Oxygen Saturation %  90 %     2 Minute Liters of Oxygen 0 L     3 Minute Oxygen Saturation % 89 %     3 Minute Liters of Oxygen 0 L     4 Minute Oxygen Saturation % 88 %     4 Minute Liters of Oxygen 0 L     5 Minute Oxygen Saturation % 90 %     5 Minute Liters of Oxygen 0 L     6 Minute Oxygen Saturation % 89 %     6 Minute Liters of Oxygen 0 L     2 Minute Post Oxygen Saturation % 93 %     2 Minute Post Liters of Oxygen 0 L        Oxygen Initial Assessment:  Oxygen Initial Assessment - 08/17/23 1007       Home Oxygen   Home Oxygen Device None    Sleep Oxygen Prescription None      Initial 6 min Walk   Oxygen Used None      Program Oxygen Prescription   Program Oxygen Prescription None      Intervention   Short Term Goals To learn and understand importance of monitoring SPO2 with pulse oximeter and demonstrate accurate use of  the pulse oximeter.;To learn and understand importance of maintaining oxygen saturations>88%;To learn and demonstrate proper pursed lip breathing techniques or other breathing techniques. ;To learn and demonstrate proper use of respiratory medications    Long  Term Goals Verbalizes importance of monitoring SPO2 with pulse oximeter and return demonstration;Maintenance of O2 saturations>88%;Exhibits proper breathing techniques, such as pursed lip breathing or other method taught during program session;Compliance with respiratory medication;Demonstrates proper use of MDI's          Oxygen Re-Evaluation:   Oxygen Discharge (Final Oxygen Re-Evaluation):   Initial Exercise Prescription:  Initial Exercise Prescription - 08/17/23 0900       Date of Initial Exercise RX and Referring Provider   Date 08/17/23    Referring Provider Freddy Blinks MD      Treadmill   MPH 1.5    Grade 0    Minutes 15    METs 2.23      NuStep   Level 2    SPM 50    Minutes 15    METs 1.9      Prescription Details   Frequency (times per week) 2    Duration Progress to 30 minutes of  continuous aerobic without signs/symptoms of physical distress      Intensity   THRR 40-80% of Max Heartrate 108-134    Ratings of Perceived Exertion 11-13    Perceived Dyspnea 0-4      Progression   Progression Continue to progress workloads to maintain intensity without signs/symptoms of physical distress.      Resistance Training   Training Prescription Yes    Weight 4    Reps 10-15          Perform Capillary Blood Glucose checks as needed.  Exercise Prescription Changes:   Exercise Prescription Changes     Row Name 08/17/23 0900             Response to Exercise   Blood Pressure (Admit) 90/60       Blood Pressure (Exercise) 106/62       Blood Pressure (Exit) 98/60       Heart Rate (Admit) 81 bpm       Heart Rate (Exercise) 92 bpm       Heart Rate (Exit) 83 bpm       Oxygen Saturation (Admit) 92 %       Oxygen Saturation (Exercise) 88 %       Oxygen Saturation (Exit) 93 %       Rating of Perceived Exertion (Exercise) 12       Perceived Dyspnea (Exercise) 1          Exercise Comments:   Exercise Goals and Review:   Exercise Goals     Row Name 08/17/23 0942             Exercise Goals   Increase Physical Activity Yes       Intervention Provide advice, education, support and counseling about physical activity/exercise needs.;Develop an individualized exercise prescription for aerobic and resistive training based on initial evaluation findings, risk stratification, comorbidities and participant's personal goals.       Expected Outcomes Long Term: Add in home exercise to make exercise part of routine and to increase amount of physical activity.;Short Term: Attend rehab on a regular basis to increase amount of physical activity.;Long Term: Exercising regularly at least 3-5 days a week.       Increase Strength and Stamina Yes       Intervention Provide  advice, education, support and counseling about physical activity/exercise needs.;Develop an individualized  exercise prescription for aerobic and resistive training based on initial evaluation findings, risk stratification, comorbidities and participant's personal goals.       Expected Outcomes Short Term: Increase workloads from initial exercise prescription for resistance, speed, and METs.;Short Term: Perform resistance training exercises routinely during rehab and add in resistance training at home;Long Term: Improve cardiorespiratory fitness, muscular endurance and strength as measured by increased METs and functional capacity ( )       Able to understand and use rate of perceived exertion (RPE) scale Yes       Intervention Provide education and explanation on how to use RPE scale       Expected Outcomes Short Term: Able to use RPE daily in rehab to express subjective intensity level;Long Term:  Able to use RPE to guide intensity level when exercising independently       Able to understand and use Dyspnea scale Yes       Intervention Provide education and explanation on how to use Dyspnea scale       Expected Outcomes Short Term: Able to use Dyspnea scale daily in rehab to express subjective sense of shortness of breath during exertion;Long Term: Able to use Dyspnea scale to guide intensity level when exercising independently       Knowledge and understanding of Target Heart Rate Range (THRR) Yes       Intervention Provide education and explanation of THRR including how the numbers were predicted and where they are located for reference       Expected Outcomes Short Term: Able to state/look up THRR;Long Term: Able to use THRR to govern intensity when exercising independently;Short Term: Able to use daily as guideline for intensity in rehab       Able to check pulse independently Yes       Intervention Provide education and demonstration on how to check pulse in carotid and radial arteries.;Review the importance of being able to check your own pulse for safety during independent exercise       Expected  Outcomes Short Term: Able to explain why pulse checking is important during independent exercise;Long Term: Able to check pulse independently and accurately       Understanding of Exercise Prescription Yes       Intervention Provide education, explanation, and written materials on patient's individual exercise prescription       Expected Outcomes Short Term: Able to explain program exercise prescription;Long Term: Able to explain home exercise prescription to exercise independently          Exercise Goals Re-Evaluation :   Discharge Exercise Prescription (Final Exercise Prescription Changes):  Exercise Prescription Changes - 08/17/23 0900       Response to Exercise   Blood Pressure (Admit) 90/60    Blood Pressure (Exercise) 106/62    Blood Pressure (Exit) 98/60    Heart Rate (Admit) 81 bpm    Heart Rate (Exercise) 92 bpm    Heart Rate (Exit) 83 bpm    Oxygen Saturation (Admit) 92 %    Oxygen Saturation (Exercise) 88 %    Oxygen Saturation (Exit) 93 %    Rating of Perceived Exertion (Exercise) 12    Perceived Dyspnea (Exercise) 1          Nutrition:  Target Goals: Understanding of nutrition guidelines, daily intake of sodium 1500mg , cholesterol 200mg , calories 30% from fat and 7% or less from saturated fats, daily to have 5 or  more servings of fruits and vegetables.  Biometrics:  Pre Biometrics - 08/17/23 0943       Pre Biometrics   Height 5' 5.5 (1.664 m)    Weight 75.8 kg    Waist Circumference 36 inches    Hip Circumference 37.5 inches    Waist to Hip Ratio 0.96 %    BMI (Calculated) 27.38    Grip Strength 17.4 kg           Nutrition Therapy Plan and Nutrition Goals:   Nutrition Assessments:  MEDIFICTS Score Key: >=70 Need to make dietary changes  40-70 Heart Healthy Diet <= 40 Therapeutic Level Cholesterol Diet  Flowsheet Row PULMONARY REHAB OTHER RESP ORIENTATION from 08/17/2023 in [facility] CARDIAC REHABILITATION  Picture Your Plate Total Score  on Admission 47   Picture Your Plate Scores: <59 Unhealthy dietary pattern with much room for improvement. 41-50 Dietary pattern unlikely to meet recommendations for good health and room for improvement. 51-60 More healthful dietary pattern, with some room for improvement.  >60 Healthy dietary pattern, although there may be some specific behaviors that could be improved.    Nutrition Goals Re-Evaluation:   Nutrition Goals Discharge (Final Nutrition Goals Re-Evaluation):   Psychosocial: Target Goals: Acknowledge presence or absence of significant depression and/or stress, maximize coping skills, provide positive support system. Participant is able to verbalize types and ability to use techniques and skills needed for reducing stress and depression.  Initial Review & Psychosocial Screening:  Initial Psych Review & Screening - 08/17/23 1008       Initial Review   Current issues with Current Sleep Concerns;Current Psychotropic Meds      Family Dynamics   Good Support System? Yes      Barriers   Psychosocial barriers to participate in program There are no identifiable barriers or psychosocial needs.      Screening Interventions   Interventions Encouraged to exercise;To provide support and resources with identified psychosocial needs;Provide feedback about the scores to participant    Expected Outcomes Short Term goal: Utilizing psychosocial counselor, staff and physician to assist with identification of specific Stressors or current issues interfering with healing process. Setting desired goal for each stressor or current issue identified.;Long Term Goal: Stressors or current issues are controlled or eliminated.;Short Term goal: Identification and review with participant of any Quality of Life or Depression concerns found by scoring the questionnaire.;Long Term goal: The participant improves quality of Life and PHQ9 Scores as seen by post scores and/or verbalization of changes           Quality of Life Scores:  Scores of 19 and below usually indicate a poorer quality of life in these areas.  A difference of  2-3 points is a clinically meaningful difference.  A difference of 2-3 points in the total score of the Quality of Life Index has been associated with significant improvement in overall quality of life, self-image, physical symptoms, and general health in studies assessing change in quality of life.   PHQ-9: Review Flowsheet  More data exists      08/17/2023 04/21/2023 01/28/2023 10/21/2022 08/04/2022  Depression screen PHQ 2/9  Decreased Interest 0 0 0 0 0  Down, Depressed, Hopeless 0 0 0 0 0  PHQ - 2 Score 0 0 0 0 0  Altered sleeping 3 0 - - -  Tired, decreased energy 1 0 - - -  Change in appetite 2 0 - - -  Feeling bad or failure about yourself  0 0 - - -  Trouble concentrating 0 0 - - -  Moving slowly or fidgety/restless 0 0 - - -  Suicidal thoughts 0 0 - - -  PHQ-9 Score 6 0 - - -  Difficult doing work/chores Somewhat difficult Not difficult at all - - -   Interpretation of Total Score  Total Score Depression Severity:  1-4 = Minimal depression, 5-9 = Mild depression, 10-14 = Moderate depression, 15-19 = Moderately severe depression, 20-27 = Severe depression   Psychosocial Evaluation and Intervention:  Psychosocial Evaluation - 08/17/23 1009       Psychosocial Evaluation & Interventions   Interventions Stress management education;Relaxation education;Encouraged to exercise with the program and follow exercise prescription    Comments Patient was referred to PR with chronic systolic HF from [facility]. His initial PHQ-9 score was 6 due to sleep issues and lack of energy. He says he is taking Lexapro  for anxiety which works very well. He has difficulty falling and staying asleep. He has been diagnosed with OSA but refuses to wear a CPAP. He says he has to get up frequently at nigth with nocturia and feels a CPAP would not help this. He denies any depression of  stressors. He lives with his wife who is his main support along with his sister and neices and nephews. His goals for the program are to be able to do all the activities he wants to do; improve his breathing and energy.    Expected Outcomes Short Term: Patient will start the program and attend consistently. Long Term: Patient will complete the program meeting personal goals.    Continue Psychosocial Services  No Follow up required          Psychosocial Re-Evaluation:   Psychosocial Discharge (Final Psychosocial Re-Evaluation):    Education: Education Goals: Education classes will be provided on a weekly basis, covering required topics. Participant will state understanding/return demonstration of topics presented.  Learning Barriers/Preferences:  Learning Barriers/Preferences - 08/17/23 9166       Learning Barriers/Preferences   Learning Barriers None    Learning Preferences Skilled Demonstration          Education Topics: How Lungs Work and Diseases: - Discuss the anatomy of the lungs and diseases that can affect the lungs, such as COPD.   Exercise: -Discuss the importance of exercise, FITT principles of exercise, normal and abnormal responses to exercise, and how to exercise safely.   Environmental Irritants: -Discuss types of environmental irritants and how to limit exposure to environmental irritants.   Meds/Inhalers and oxygen: - Discuss respiratory medications, definition of an inhaler and oxygen, and the proper way to use an inhaler and oxygen.   Energy Saving Techniques: - Discuss methods to conserve energy and decrease shortness of breath when performing activities of daily living.    Bronchial Hygiene / Breathing Techniques: - Discuss breathing mechanics, pursed-lip breathing technique,  proper posture, effective ways to clear airways, and other functional breathing techniques   Cleaning Equipment: - Provides group verbal and written instruction about  the health risks of elevated stress, cause of high stress, and healthy ways to reduce stress.   Nutrition I: Fats: - Discuss the types of cholesterol, what cholesterol does to the body, and how cholesterol levels can be controlled.   Nutrition II: Labels: -Discuss the different components of food labels and how to read food labels.   Respiratory Infections: - Discuss the signs and symptoms of respiratory infections, ways to prevent respiratory infections, and the importance of seeking medical treatment when having  a respiratory infection.   Stress I: Signs and Symptoms: - Discuss the causes of stress, how stress may lead to anxiety and depression, and ways to limit stress.   Stress II: Relaxation: -Discuss relaxation techniques to limit stress.   Oxygen for Home/Travel: - Discuss how to prepare for travel when on oxygen and proper ways to transport and store oxygen to ensure safety.   Knowledge Questionnaire Score:  Knowledge Questionnaire Score - 08/17/23 1006       Knowledge Questionnaire Score   Pre Score 9/18          Core Components/Risk Factors/Patient Goals at Admission:  Personal Goals and Risk Factors at Admission - 08/17/23 1004       Core Components/Risk Factors/Patient Goals on Admission    Weight Management Weight Maintenance    Improve shortness of breath with ADL's Yes    Intervention Provide education, individualized exercise plan and daily activity instruction to help decrease symptoms of SOB with activities of daily living.    Expected Outcomes Short Term: Improve cardiorespiratory fitness to achieve a reduction of symptoms when performing ADLs;Long Term: Be able to perform more ADLs without symptoms or delay the onset of symptoms    Heart Failure Yes    Intervention Provide a combined exercise and nutrition program that is supplemented with education, support and counseling about heart failure. Directed toward relieving symptoms such as shortness of  breath, decreased exercise tolerance, and extremity edema.    Expected Outcomes Improve functional capacity of life;Short term: Daily weights obtained and reported for increase. Utilizing diuretic protocols set by physician.;Long term: Adoption of self-care skills and reduction of barriers for early signs and symptoms recognition and intervention leading to self-care maintenance.;Short term: Attendance in program 2-3 days a week with increased exercise capacity. Reported lower sodium intake. Reported increased fruit and vegetable intake. Reports medication compliance.    Hypertension Yes    Intervention Provide education on lifestyle modifcations including regular physical activity/exercise, weight management, moderate sodium restriction and increased consumption of fresh fruit, vegetables, and low fat dairy, alcohol moderation, and smoking cessation.;Monitor prescription use compliance.    Expected Outcomes Short Term: Continued assessment and intervention until BP is < 140/65mm HG in hypertensive participants. < 130/76mm HG in hypertensive participants with diabetes, heart failure or chronic kidney disease.;Long Term: Maintenance of blood pressure at goal levels.    Lipids Yes    Intervention Provide education and support for participant on nutrition & aerobic/resistive exercise along with prescribed medications to achieve LDL 70mg , HDL >40mg .    Expected Outcomes Short Term: Participant states understanding of desired cholesterol values and is compliant with medications prescribed. Participant is following exercise prescription and nutrition guidelines.;Long Term: Cholesterol controlled with medications as prescribed, with individualized exercise RX and with personalized nutrition plan. Value goals: LDL < 70mg , HDL > 40 mg.          Core Components/Risk Factors/Patient Goals Review:    Core Components/Risk Factors/Patient Goals at Discharge (Final Review):    ITP Comments:   Comments:  Patient arrived for 1st visit/orientation/education at 0800. Patient was referred to PR by Dr. Lucendia Rome due to Chronic Systolic HF. During orientation advised patient on arrival and appointment times what to wear, what to do before, during and after exercise. Reviewed attendance and class policy.  Pt is scheduled to return Pulmonary Rehab on 08/18/23 at 1030. Pt was advised to come to class 15 minutes before class starts.  Discussed RPE/Dpysnea scales. Patient participated in warm up stretches.  Patient was able to complete 6 minute walk test. Patient was measured for the equipment. Discussed equipment safety with patient. Took patient pre-anthropometric measurements. Patient finished visit at 0945.

## 2023-08-17 NOTE — Patient Instructions (Signed)
 Patient Instructions  Patient Details  Name: Terry Pacheco MRN: [id number] Date of Birth: Jul 19, 1950 Referring Provider:  Freddy Blinks, MD  Below are your personal goals for exercise, nutrition, and risk factors. Our goal is to help you stay on track towards obtaining and maintaining these goals. We will be discussing your progress on these goals with you throughout the program.  Initial Exercise Prescription:  Initial Exercise Prescription - 08/17/23 0900       Date of Initial Exercise RX and Referring Provider   Date 08/17/23    Referring Provider Freddy Blinks MD      Treadmill   MPH 1.5    Grade 0    Minutes 15    METs 2.23      NuStep   Level 2    SPM 50    Minutes 15    METs 1.9      Prescription Details   Frequency (times per week) 2    Duration Progress to 30 minutes of continuous aerobic without signs/symptoms of physical distress      Intensity   THRR 40-80% of Max Heartrate 108-134    Ratings of Perceived Exertion 11-13    Perceived Dyspnea 0-4      Progression   Progression Continue to progress workloads to maintain intensity without signs/symptoms of physical distress.      Resistance Training   Training Prescription Yes    Weight 4    Reps 10-15          Exercise Goals: Frequency: Be able to perform aerobic exercise two to three times per week in program working toward 2-5 days per week of home exercise.  Intensity: Work with a perceived exertion of 11 (fairly light) - 15 (hard) while following your exercise prescription.  We will make changes to your prescription with you as you progress through the program.   Duration: Be able to do 30 to 45 minutes of continuous aerobic exercise in addition to a 5 minute warm-up and a 5 minute cool-down routine.   Nutrition Goals: Your personal nutrition goals will be established when you do your nutrition analysis with the dietician.  The following are general nutrition guidelines to  follow: Cholesterol < 200mg /day Sodium < 1500mg /day Fiber: Men over 50 yrs - 30 grams per day  Personal Goals:  Personal Goals and Risk Factors at Admission - 08/17/23 1004       Core Components/Risk Factors/Patient Goals on Admission    Weight Management Weight Maintenance    Improve shortness of breath with ADL's Yes    Intervention Provide education, individualized exercise plan and daily activity instruction to help decrease symptoms of SOB with activities of daily living.    Expected Outcomes Short Term: Improve cardiorespiratory fitness to achieve a reduction of symptoms when performing ADLs;Long Term: Be able to perform more ADLs without symptoms or delay the onset of symptoms    Heart Failure Yes    Intervention Provide a combined exercise and nutrition program that is supplemented with education, support and counseling about heart failure. Directed toward relieving symptoms such as shortness of breath, decreased exercise tolerance, and extremity edema.    Expected Outcomes Improve functional capacity of life;Short term: Daily weights obtained and reported for increase. Utilizing diuretic protocols set by physician.;Long term: Adoption of self-care skills and reduction of barriers for early signs and symptoms recognition and intervention leading to self-care maintenance.;Short term: Attendance in program 2-3 days a week with increased exercise capacity. Reported lower sodium intake.  Reported increased fruit and vegetable intake. Reports medication compliance.    Hypertension Yes    Intervention Provide education on lifestyle modifcations including regular physical activity/exercise, weight management, moderate sodium restriction and increased consumption of fresh fruit, vegetables, and low fat dairy, alcohol moderation, and smoking cessation.;Monitor prescription use compliance.    Expected Outcomes Short Term: Continued assessment and intervention until BP is < 140/23mm HG in hypertensive  participants. < 130/34mm HG in hypertensive participants with diabetes, heart failure or chronic kidney disease.;Long Term: Maintenance of blood pressure at goal levels.    Lipids Yes    Intervention Provide education and support for participant on nutrition & aerobic/resistive exercise along with prescribed medications to achieve LDL 70mg , HDL >40mg .    Expected Outcomes Short Term: Participant states understanding of desired cholesterol values and is compliant with medications prescribed. Participant is following exercise prescription and nutrition guidelines.;Long Term: Cholesterol controlled with medications as prescribed, with individualized exercise RX and with personalized nutrition plan. Value goals: LDL < 70mg , HDL > 40 mg.          Tobacco Use Initial Evaluation: Social History   Tobacco Use  Smoking Status Former   Current packs/day: 0.00   Average packs/day: 0.5 packs/day for 45.0 years (22.5 ttl pk-yrs)   Types: Cigarettes   Start date: 01/12/1983   Quit date: 11/17/2016   Years since quitting: 6.7  Smokeless Tobacco Never      Copy of goals given to participant.

## 2023-08-18 ENCOUNTER — Encounter (HOSPITAL_COMMUNITY)
Admission: RE | Admit: 2023-08-18 | Discharge: 2023-08-18 | Disposition: A | Discharge status: Home / Self Care | Source: Ambulatory Visit

## 2023-08-18 DIAGNOSIS — I5022 Chronic systolic (congestive) heart failure: Secondary | ICD-10-CM

## 2023-08-18 NOTE — Progress Notes (Signed)
 Daily Session Note  Patient Details  Name: ESCO JOSLYN MRN: [id number] Date of Birth: 1950-05-30 Referring Provider:   Flowsheet Row PULMONARY REHAB OTHER RESP ORIENTATION from 08/17/2023 in [facility] CARDIAC REHABILITATION  Referring Provider Freddy Blinks MD    Encounter Date: 08/18/2023  Check In:  Session Check In - 08/18/23 1030       Check-In   Supervising physician immediately available to respond to emergencies See telemetry face sheet for immediately available MD    Location [facility]    Staff Present Powell Benders, BS, Exercise Physiologist;Brittany Jackquline, BSN, RN, Lucina Rohrer, RN, BSN    Virtual Visit No    Medication changes reported     No    Fall or balance concerns reported    No    Tobacco Cessation No Change    Warm-up and Cool-down Performed on first and last piece of equipment    Resistance Training Performed Yes    VAD Patient? No    PAD/SET Patient? No      Pain Assessment   Currently in Pain? No/denies    Multiple Pain Sites No          Capillary Blood Glucose: No results found for this or any previous visit (from the past 24 hours).    Social History   Tobacco Use  Smoking Status Former   Current packs/day: 0.00   Average packs/day: 0.5 packs/day for 45.0 years (22.5 ttl pk-yrs)   Types: Cigarettes   Start date: 01/12/1983   Quit date: 11/17/2016   Years since quitting: 6.7  Smokeless Tobacco Never    Goals Met:  Independence with exercise equipment Exercise tolerated well No report of concerns or symptoms today Strength training completed today  Goals Unmet:  Not Applicable  Comments: First full day of exercise!  Patient was oriented to gym and equipment including functions, settings, policies, and procedures.  Patient's individual exercise prescription and treatment plan were reviewed.  All starting workloads were established based on the results of the 6 minute walk test done at initial orientation  visit.  The plan for exercise progression was also introduced and progression will be customized based on patient's performance and goals.

## 2023-08-20 ENCOUNTER — Encounter (HOSPITAL_COMMUNITY)
Admission: RE | Admit: 2023-08-20 | Discharge: 2023-08-20 | Disposition: A | Discharge status: Home / Self Care | Source: Ambulatory Visit

## 2023-08-20 DIAGNOSIS — I5022 Chronic systolic (congestive) heart failure: Secondary | ICD-10-CM | POA: Diagnosis not present

## 2023-08-20 NOTE — Progress Notes (Signed)
 Daily Session Note  Patient Details  Name: Terry Pacheco MRN: [id number] Date of Birth: 1950/06/06 Referring Provider:   Flowsheet Row PULMONARY REHAB OTHER RESP ORIENTATION from 08/17/2023 in [facility] CARDIAC REHABILITATION  Referring Provider Freddy Blinks MD    Encounter Date: 08/20/2023  Check In:  Session Check In - 08/20/23 1015       Check-In   Supervising physician immediately available to respond to emergencies See telemetry face sheet for immediately available MD    Location [facility]    Staff Present Adrien Louder, RN, BSN;Heather Con HECKLE, Exercise Physiologist;Hillary Dean BSN, RN   Laymon Landau, [place]   Virtual Visit No    Medication changes reported     No    Fall or balance concerns reported    No    Warm-up and Cool-down Performed on first and last piece of equipment    Resistance Training Performed Yes    VAD Patient? No    PAD/SET Patient? No      Pain Assessment   Currently in Pain? No/denies    Multiple Pain Sites No          Capillary Blood Glucose: No results found for this or any previous visit (from the past 24 hours).    Social History   Tobacco Use  Smoking Status Former   Current packs/day: 0.00   Average packs/day: 0.5 packs/day for 45.0 years (22.5 ttl pk-yrs)   Types: Cigarettes   Start date: 01/12/1983   Quit date: 11/17/2016   Years since quitting: 6.7  Smokeless Tobacco Never    Goals Met:  Independence with exercise equipment Exercise tolerated well No report of concerns or symptoms today Strength training completed today  Goals Unmet:  Not Applicable  Comments: Pt able to follow exercise prescription today without complaint.  Will continue to monitor for progression.

## 2023-08-22 ENCOUNTER — Other Ambulatory Visit: Payer: Self-pay | Admitting: Family Medicine

## 2023-08-22 DIAGNOSIS — F411 Generalized anxiety disorder: Secondary | ICD-10-CM

## 2023-08-24 ENCOUNTER — Encounter (HOSPITAL_COMMUNITY)
Admission: RE | Admit: 2023-08-24 | Discharge: 2023-08-24 | Disposition: A | Discharge status: Home / Self Care | Source: Ambulatory Visit | Attending: Physician Assistant

## 2023-08-24 DIAGNOSIS — I5022 Chronic systolic (congestive) heart failure: Secondary | ICD-10-CM

## 2023-08-24 DIAGNOSIS — N401 Enlarged prostate with lower urinary tract symptoms: Secondary | ICD-10-CM | POA: Diagnosis not present

## 2023-08-24 DIAGNOSIS — Q676 Pectus excavatum: Secondary | ICD-10-CM | POA: Diagnosis not present

## 2023-08-24 DIAGNOSIS — F411 Generalized anxiety disorder: Secondary | ICD-10-CM | POA: Diagnosis not present

## 2023-08-24 DIAGNOSIS — I350 Nonrheumatic aortic (valve) stenosis: Secondary | ICD-10-CM | POA: Diagnosis not present

## 2023-08-24 DIAGNOSIS — I509 Heart failure, unspecified: Secondary | ICD-10-CM | POA: Diagnosis not present

## 2023-08-24 DIAGNOSIS — C099 Malignant neoplasm of tonsil, unspecified: Secondary | ICD-10-CM | POA: Diagnosis not present

## 2023-08-24 DIAGNOSIS — I2510 Atherosclerotic heart disease of native coronary artery without angina pectoris: Secondary | ICD-10-CM | POA: Diagnosis not present

## 2023-08-24 DIAGNOSIS — E039 Hypothyroidism, unspecified: Secondary | ICD-10-CM | POA: Diagnosis not present

## 2023-08-24 DIAGNOSIS — I10 Essential (primary) hypertension: Secondary | ICD-10-CM | POA: Diagnosis not present

## 2023-08-24 DIAGNOSIS — E785 Hyperlipidemia, unspecified: Secondary | ICD-10-CM | POA: Diagnosis not present

## 2023-08-24 DIAGNOSIS — K219 Gastro-esophageal reflux disease without esophagitis: Secondary | ICD-10-CM | POA: Diagnosis not present

## 2023-08-24 DIAGNOSIS — J42 Unspecified chronic bronchitis: Secondary | ICD-10-CM | POA: Diagnosis not present

## 2023-08-24 NOTE — Progress Notes (Signed)
 Daily Session Note  Patient Details  Name: Terry Pacheco MRN: [id number] Date of Birth: 04-18-1950 Referring Provider:   Flowsheet Row PULMONARY REHAB OTHER RESP ORIENTATION from 08/17/2023 in [facility] CARDIAC REHABILITATION  Referring Provider Freddy Blinks MD    Encounter Date: 08/24/2023  Check In:  Session Check In - 08/24/23 1059       Check-In   Supervising physician immediately available to respond to emergencies See telemetry face sheet for immediately available MD    Location [facility]    Staff Present Danney Daring, BS, RRT, CPFT;Heather Con HECKLE, Exercise Physiologist    Virtual Visit No    Medication changes reported     No    Fall or balance concerns reported    No    Tobacco Cessation No Change    Warm-up and Cool-down Performed on first and last piece of equipment    Resistance Training Performed Yes    VAD Patient? No    PAD/SET Patient? No      Pain Assessment   Currently in Pain? No/denies          Capillary Blood Glucose: No results found for this or any previous visit (from the past 24 hours).    Social History   Tobacco Use  Smoking Status Former   Current packs/day: 0.00   Average packs/day: 0.5 packs/day for 45.0 years (22.5 ttl pk-yrs)   Types: Cigarettes   Start date: 01/12/1983   Quit date: 11/17/2016   Years since quitting: 6.7  Smokeless Tobacco Never    Goals Met:  Independence with exercise equipment Exercise tolerated well No report of concerns or symptoms today Strength training completed today  Goals Unmet:  Not Applicable  Comments: Pt able to follow exercise prescription today without complaint.  Will continue to monitor for progression.

## 2023-08-25 ENCOUNTER — Encounter (HOSPITAL_COMMUNITY)

## 2023-08-27 ENCOUNTER — Encounter (HOSPITAL_COMMUNITY)
Admission: RE | Admit: 2023-08-27 | Discharge: 2023-08-27 | Disposition: A | Discharge status: Home / Self Care | Source: Ambulatory Visit

## 2023-08-27 DIAGNOSIS — I5022 Chronic systolic (congestive) heart failure: Secondary | ICD-10-CM | POA: Insufficient documentation

## 2023-08-27 NOTE — Progress Notes (Signed)
 Daily Session Note  Patient Details  Name: Terry Pacheco MRN: [id number] Date of Birth: September 14, 1950 Referring Provider:   Flowsheet Row PULMONARY REHAB OTHER RESP ORIENTATION from 08/17/2023 in [facility] CARDIAC REHABILITATION  Referring Provider Freddy Blinks MD    Encounter Date: 08/27/2023  Check In:  Session Check In - 08/27/23 1032       Check-In   Supervising physician immediately available to respond to emergencies See telemetry face sheet for immediately available MD    Location [facility]    Staff Present Powell Benders, BS, Exercise Physiologist;Hillary [place] BSN, RN;Jessica [place], MA, RCEP, CCRP, CCET    Virtual Visit No    Medication changes reported     No    Fall or balance concerns reported    No    Tobacco Cessation No Change    Warm-up and Cool-down Performed on first and last piece of equipment    Resistance Training Performed Yes    VAD Patient? No    PAD/SET Patient? No      Pain Assessment   Currently in Pain? No/denies    Multiple Pain Sites No          Capillary Blood Glucose: No results found for this or any previous visit (from the past 24 hours).    Social History   Tobacco Use  Smoking Status Former   Current packs/day: 0.00   Average packs/day: 0.5 packs/day for 45.0 years (22.5 ttl pk-yrs)   Types: Cigarettes   Start date: 01/12/1983   Quit date: 11/17/2016   Years since quitting: 6.7  Smokeless Tobacco Never    Goals Met:  Independence with exercise equipment Exercise tolerated well No report of concerns or symptoms today Strength training completed today  Goals Unmet:  Not Applicable  Comments: Pt able to follow exercise prescription today without complaint.  Will continue to monitor for progression.

## 2023-09-01 ENCOUNTER — Encounter (HOSPITAL_COMMUNITY)
Admission: RE | Admit: 2023-09-01 | Discharge: 2023-09-01 | Disposition: A | Discharge status: Home / Self Care | Source: Ambulatory Visit

## 2023-09-01 DIAGNOSIS — I5022 Chronic systolic (congestive) heart failure: Secondary | ICD-10-CM | POA: Diagnosis not present

## 2023-09-01 NOTE — Progress Notes (Signed)
 Daily Session Note  Patient Details  Name: Terry Pacheco MRN: [id number] Date of Birth: 1950/09/29 Referring Provider:   Flowsheet Row PULMONARY REHAB OTHER RESP ORIENTATION from 08/17/2023 in [facility] CARDIAC REHABILITATION  Referring Provider Freddy Blinks MD    Encounter Date: 09/01/2023  Check In:  Session Check In - 09/01/23 1029       Check-In   Supervising physician immediately available to respond to emergencies See telemetry face sheet for immediately available MD    Location [facility]    Staff Present Harlene Gelineau, MA, RCEP, CCRP, CCET;Kolby Myung Con, BS, Exercise Physiologist;Brittany Jackquline, BSN, RN, WTA-C    Virtual Visit No    Medication changes reported     No    Fall or balance concerns reported    No    Tobacco Cessation No Change    Warm-up and Cool-down Performed on first and last piece of equipment    Resistance Training Performed Yes    VAD Patient? No    PAD/SET Patient? No      Pain Assessment   Currently in Pain? No/denies    Multiple Pain Sites No          Capillary Blood Glucose: No results found for this or any previous visit (from the past 24 hours).    Social History   Tobacco Use  Smoking Status Former   Current packs/day: 0.00   Average packs/day: 0.5 packs/day for 45.0 years (22.5 ttl pk-yrs)   Types: Cigarettes   Start date: 01/12/1983   Quit date: 11/17/2016   Years since quitting: 6.7  Smokeless Tobacco Never    Goals Met:  Independence with exercise equipment Using PLB without cueing & demonstrates good technique Exercise tolerated well No report of concerns or symptoms today Strength training completed today  Goals Unmet:  Not Applicable  Comments: Pt able to follow exercise prescription today without complaint.  Will continue to monitor for progression.

## 2023-09-03 ENCOUNTER — Encounter (HOSPITAL_COMMUNITY)

## 2023-09-03 DIAGNOSIS — I10 Essential (primary) hypertension: Secondary | ICD-10-CM | POA: Diagnosis not present

## 2023-09-03 DIAGNOSIS — E871 Hypo-osmolality and hyponatremia: Secondary | ICD-10-CM | POA: Diagnosis not present

## 2023-09-03 DIAGNOSIS — I509 Heart failure, unspecified: Secondary | ICD-10-CM | POA: Diagnosis not present

## 2023-09-03 DIAGNOSIS — E785 Hyperlipidemia, unspecified: Secondary | ICD-10-CM | POA: Diagnosis not present

## 2023-09-04 ENCOUNTER — Encounter (HOSPITAL_COMMUNITY)
Admission: RE | Admit: 2023-09-04 | Discharge: 2023-09-04 | Disposition: A | Discharge status: Home / Self Care | Source: Ambulatory Visit

## 2023-09-04 DIAGNOSIS — I5022 Chronic systolic (congestive) heart failure: Secondary | ICD-10-CM

## 2023-09-04 NOTE — Progress Notes (Signed)
 Daily Session Note  Patient Details  Name: Terry Pacheco MRN: [id number] Date of Birth: 1950/06/10 Referring Provider:   Flowsheet Row PULMONARY REHAB OTHER RESP ORIENTATION from 08/17/2023 in [facility] CARDIAC REHABILITATION  Referring Provider Freddy Blinks MD    Encounter Date: 09/04/2023  Check In:  Session Check In - 09/04/23 1100       Check-In   Supervising physician immediately available to respond to emergencies See telemetry face sheet for immediately available MD    Location [facility]    Staff Present Powell Benders, BS, Exercise Physiologist;Laureen Delores, BS, RRT, CPFT    Virtual Visit No    Medication changes reported     No    Fall or balance concerns reported    No    Tobacco Cessation No Change    Warm-up and Cool-down Performed on first and last piece of equipment    Resistance Training Performed Yes    VAD Patient? No    PAD/SET Patient? No      Pain Assessment   Currently in Pain? No/denies    Multiple Pain Sites No          Capillary Blood Glucose: No results found for this or any previous visit (from the past 24 hours).    Social History   Tobacco Use  Smoking Status Former   Current packs/day: 0.00   Average packs/day: 0.5 packs/day for 45.0 years (22.5 ttl pk-yrs)   Types: Cigarettes   Start date: 01/12/1983   Quit date: 11/17/2016   Years since quitting: 6.8  Smokeless Tobacco Never    Goals Met:  Independence with exercise equipment Exercise tolerated well No report of concerns or symptoms today Strength training completed today  Goals Unmet:  Not Applicable  Comments: Pt able to follow exercise prescription today without complaint.  Will continue to monitor for progression.

## 2023-09-08 ENCOUNTER — Encounter (HOSPITAL_COMMUNITY)
Admission: RE | Admit: 2023-09-08 | Discharge: 2023-09-08 | Disposition: A | Discharge status: Home / Self Care | Source: Ambulatory Visit

## 2023-09-08 DIAGNOSIS — I5022 Chronic systolic (congestive) heart failure: Secondary | ICD-10-CM

## 2023-09-08 NOTE — Progress Notes (Signed)
 Daily Session Note  Patient Details  Name: Terry Pacheco MRN: [id number] Date of Birth: May 08, 1950 Referring Provider:   Flowsheet Row PULMONARY REHAB OTHER RESP ORIENTATION from 08/17/2023 in [facility] CARDIAC REHABILITATION  Referring Provider Freddy Blinks MD    Encounter Date: 09/08/2023  Check In:  Session Check In - 09/08/23 1030       Check-In   Supervising physician immediately available to respond to emergencies See telemetry face sheet for immediately available MD    Location [facility]    Staff Present Powell Benders, BS, Exercise Physiologist;Jessica [place], MA, RCEP, CCRP, CCET    Virtual Visit No    Medication changes reported     No    Fall or balance concerns reported    No    Tobacco Cessation No Change    Warm-up and Cool-down Performed on first and last piece of equipment    Resistance Training Performed Yes    VAD Patient? No    PAD/SET Patient? No      Pain Assessment   Currently in Pain? No/denies    Multiple Pain Sites No          Capillary Blood Glucose: No results found for this or any previous visit (from the past 24 hours).    Social History   Tobacco Use  Smoking Status Former   Current packs/day: 0.00   Average packs/day: 0.5 packs/day for 45.0 years (22.5 ttl pk-yrs)   Types: Cigarettes   Start date: 01/12/1983   Quit date: 11/17/2016   Years since quitting: 6.8  Smokeless Tobacco Never    Goals Met:  Independence with exercise equipment Using PLB without cueing & demonstrates good technique Exercise tolerated well No report of concerns or symptoms today Strength training completed today  Goals Unmet:  Not Applicable  Comments: Pt able to follow exercise prescription today without complaint.  Will continue to monitor for progression.

## 2023-09-09 ENCOUNTER — Encounter (HOSPITAL_COMMUNITY): Payer: Self-pay | Admitting: *Deleted

## 2023-09-09 DIAGNOSIS — D225 Melanocytic nevi of trunk: Secondary | ICD-10-CM | POA: Diagnosis not present

## 2023-09-09 DIAGNOSIS — L821 Other seborrheic keratosis: Secondary | ICD-10-CM | POA: Diagnosis not present

## 2023-09-09 DIAGNOSIS — D485 Neoplasm of uncertain behavior of skin: Secondary | ICD-10-CM | POA: Diagnosis not present

## 2023-09-09 DIAGNOSIS — L570 Actinic keratosis: Secondary | ICD-10-CM | POA: Diagnosis not present

## 2023-09-09 DIAGNOSIS — I5022 Chronic systolic (congestive) heart failure: Secondary | ICD-10-CM

## 2023-09-09 DIAGNOSIS — L814 Other melanin hyperpigmentation: Secondary | ICD-10-CM | POA: Diagnosis not present

## 2023-09-09 NOTE — Progress Notes (Signed)
 Pulmonary Individual Treatment Plan  Patient Details  Name: Terry Pacheco MRN: [id number] Date of Birth: [birth year] Referring Provider:   Flowsheet Row PULMONARY REHAB OTHER RESP ORIENTATION from 08/17/2023 in [facility] CARDIAC REHABILITATION  Referring Provider Freddy Blinks MD    Initial Encounter Date:  Flowsheet Row PULMONARY REHAB OTHER RESP ORIENTATION from 08/17/2023 in [place] [facility]AC RE[facility]  Date 08/17/23    Visit Diagnosis: Heart failure, chronic systolic (HCC)  Patient's Home Medications on Admission:   Current Outpatient Medications:    acetaminophen  (TYLENOL ) 500 MG tablet, Take 1,000 mg by mouth daily as needed for moderate pain or headache., Disp: , Rfl:    albuterol  (ACCUNEB ) 1.25 MG/3ML nebulizer solution, Take 3 mLs (1.25 mg total) by nebulization every 6 (six) hours as needed for wheezing., Disp: 75 mL, Rfl: 12   amLODipine  (NORVASC ) 10 MG tablet, TAKE 1 TABLET EVERY DAY FOR FOR BLOOD PRESSURE, Disp: 90 tablet, Rfl: 3   amoxicillin -clavulanate (AUGMENTIN ) 875-125 MG tablet, Take 1 tablet by mouth 2 (two) times daily. Take all of this medication (Patient not taking: Reported on 08/17/2023), Disp: 20 tablet, Rfl: 0   aspirin  EC 81 MG tablet, Take 81 mg by mouth daily., Disp: , Rfl:    atorvastatin  (LIPITOR) 40 MG tablet, Take 1 tablet (40 mg total) by mouth daily., Disp: 90 tablet, Rfl: 3   B Complex CAPS, TAKE 1 CAPSULE BY MOUTH EVERY DAY, Disp: 100 capsule, Rfl: 2   calcium  carbonate (TUMS - DOSED IN MG ELEMENTAL CALCIUM ) 500 MG chewable tablet, Chew 2 tablets by mouth daily as needed for indigestion or heartburn., Disp: , Rfl:    empagliflozin (JARDIANCE) 10 MG TABS tablet, Take 1 tablet by mouth daily., Disp: , Rfl:    escitalopram  (LEXAPRO ) 20 MG tablet, TAKE 1 TABLET BY MOUTH EVERY DAY, Disp: 90 tablet, Rfl: 0   ezetimibe  (ZETIA ) 10 MG tablet, Take 1 tablet (10 mg total) by mouth daily., Disp: 90 tablet, Rfl: 0   fexofenadine -pseudoephedrine  (ALLEGRA-D  24) 180-240 MG 24 hr tablet, Take 1 tablet by mouth every evening. For allergy and congestion (Patient not taking: Reported on 08/17/2023), Disp: 30 tablet, Rfl: 11   finasteride  (PROSCAR ) 5 MG tablet, Take 1 tablet (5 mg total) by mouth daily. For urine flow (Patient not taking: Reported on 08/17/2023), Disp: 90 tablet, Rfl: 3   fluticasone  furoate-vilanterol (BREO ELLIPTA ) 200-25 MCG/ACT AEPB, Inhale 1 puff into the lungs daily., Disp: 1 each, Rfl: 11   Glucosamine 500 MG CAPS, Take by mouth., Disp: , Rfl:    moxifloxacin  (AVELOX ) 400 MG tablet, Take 1 tablet (400 mg total) by mouth daily. (Patient not taking: Reported on 08/17/2023), Disp: 10 tablet, Rfl: 0   omeprazole  (PRILOSEC) 20 MG capsule, TAKE 1 CAPSULE BY MOUTH EVERY DAY, Disp: 90 capsule, Rfl: 3   spironolactone (ALDACTONE) 25 MG tablet, Take 12.5 mg by mouth daily., Disp: , Rfl:    tamsulosin  (FLOMAX ) 0.4 MG CAPS capsule, Take 2 capsules (0.8 mg total) by mouth at bedtime. 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    Hypertension    Sleep apnea    does not use CPAP   Wears glasses     Tobacco Use: Social History   Tobacco Use  Smoking Status Former   Current packs/day: 0.00    Average packs/day: 0.5 packs/day for 45.0 years (22.5 ttl pk-yrs)   Types: Cigarettes   Start date: 01/12/1983   Quit date: 11/17/2016   Years since quitting: 6.8  Smokeless Tobacco Never    Labs: Review Flowsheet  More data exists      Latest Ref Rng & Units 04/18/2021 10/21/2021 04/22/2022 10/21/2022 04/21/2023  Labs for ITP Cardiac and Pulmonary Rehab  Cholestrol 100 - 199 mg/dL 892  889  880  882  895   LDL (calc) 0 - 99 mg/dL 49  45  49  44  44   HDL-C >39 mg/dL 44  54  58  61  47   Trlycerides 0 - 149 mg/dL 65  39  51  51  56     Capillary Blood Glucose: Lab Results  Component Value Date   GLUCAP 93 12/08/2017   GLUCAP 106 (H) 04/29/2017   GLUCAP 105 (H) 12/02/2016     Pulmonary Assessment Scores:  Pulmonary Assessment Scores     Row Name 08/17/23 1008         ADL UCSD   ADL Phase Entry     SOB Score total 31     Rest 0     Walk 1     Stairs 3     Bath 2     Dress 1     Shop 2       CAT Score   CAT Score 15       mMRC Score   mMRC Score 2       UCSD: Self-administered rating of dyspnea associated with activities of daily living (ADLs) 6-point scale (0 = not at all to 5 = maximal or unable to do because of breathlessness)  Scoring Scores range from 0 to 120.  Minimally important difference is 5 units  CAT: CAT can identify the health impairment of COPD patients and is better correlated with disease progression.  CAT has a scoring range of zero to 40. The CAT score is classified into four groups of low (less than 10), medium (10 - 20), high (21-30) and very high (31-40) based on the impact level of disease on health status. A CAT score over 10 suggests significant symptoms.  A worsening CAT score could be explained by an exacerbation, poor medication adherence, poor inhaler technique, or progression of COPD or comorbid conditions.  CAT MCID is 2 points  mMRC: mMRC (Modified Medical Research Council) Dyspnea Scale is used to assess the degree of  baseline functional disability in patients of respiratory disease due to dyspnea. No minimal important difference is established. A decrease in score of 1 point or greater is considered a positive change.   Pulmonary Function Assessment:   Exercise Target Goals: Exercise Program Goal: Individual exercise prescription set using results from initial 6 min walk test and THRR while considering  patient's activity barriers and safety.  Exercise Prescription Goal: Initial exercise prescription builds to 30-45 minutes a day of aerobic activity, 2-3 days per week.  Home exercise guidelines will be given to patient during program as part of exercise prescription that the participant will acknowledge.  Activity Barriers & Risk Stratification:  Activity Barriers & Cardiac Risk Stratification - 08/17/23 0832       Activity Barriers & Cardiac Risk Stratification   Activity Barriers Shortness of Breath;Deconditioning;Muscular Weakness;Right Hip Replacement;Arthritis;Balance Concerns   TRHR 2017.   Cardiac Risk Stratification Moderate          6 Minute Walk:  6 Minute Walk     Row Name 08/17/23 0939         6 Minute Walk   Phase Initial     Distance 1190 feet     Walk Time 6 minutes     # of Rest Breaks 0     MPH 2.25     METS 2.28     RPE 12     Perceived Dyspnea  1     VO2 Peak 7.96     Symptoms No     Resting HR 81 bpm     Resting BP 90/60     Resting Oxygen Saturation  92 %     Exercise Oxygen Saturation  during 6 min walk 88 %     Max Ex. HR 92 bpm     Max Ex. BP 106/62     2 Minute Post BP 98/60       Interval HR   1 Minute HR 88     2 Minute HR 99     3 Minute HR 98     4 Minute HR 92     5 Minute HR 92     6 Minute HR 92     2 Minute Post HR 83     Interval Heart Rate? Yes       Interval Oxygen   Interval Oxygen? Yes     Baseline Oxygen Saturation % 92 %     1 Minute Oxygen Saturation % 92 %     1 Minute Liters of Oxygen 0 L     2 Minute Oxygen Saturation %  90 %     2 Minute Liters of Oxygen 0 L     3 Minute Oxygen Saturation % 89 %     3 Minute Liters of Oxygen 0 L     4 Minute Oxygen Saturation % 88 %     4 Minute Liters of Oxygen 0 L     5 Minute Oxygen Saturation % 90 %     5 Minute Liters of Oxygen 0 L     6 Minute Oxygen Saturation % 89 %     6 Minute Liters of Oxygen 0 L     2 Minute Post Oxygen Saturation % 93 %     2 Minute Post Liters of Oxygen 0 L        Oxygen Initial Assessment:  Oxygen Initial Assessment - 08/17/23 1007       Home Oxygen   Home Oxygen Device None    Sleep Oxygen Prescription None      Initial 6 min Walk   Oxygen Used None      Program Oxygen Prescription   Program Oxygen Prescription None      Intervention   Short Term Goals To learn and understand importance of monitoring SPO2 with pulse oximeter and demonstrate accurate use of  the pulse oximeter.;To learn and understand importance of maintaining oxygen saturations>88%;To learn and demonstrate proper pursed lip breathing techniques or other breathing techniques. ;To learn and demonstrate proper use of respiratory medications    Long  Term Goals Verbalizes importance of monitoring SPO2 with pulse oximeter and return demonstration;Maintenance of O2 saturations>88%;Exhibits proper breathing techniques, such as pursed lip breathing or other method taught during program session;Compliance with respiratory medication;Demonstrates proper use of MDI's          Oxygen Re-Evaluation:  Oxygen Re-Evaluation     Row Name 08/18/23 1034             Goals/Expected Outcomes   Comments Reviewed PLB technique with pt.  Talked about how it works and it's importance in maintaining their exercise saturations.       Goals/Expected Outcomes Short: Become more profiecient at using PLB.   Long: Become independent at using PLB.          Oxygen Discharge (Final Oxygen Re-Evaluation):  Oxygen Re-Evaluation - 08/18/23 1034       Goals/Expected Outcomes    Comments Reviewed PLB technique with pt.  Talked about how it works and it's importance in maintaining their exercise saturations.    Goals/Expected Outcomes Short: Become more profiecient at using PLB.   Long: Become independent at using PLB.          Initial Exercise Prescription:  Initial Exercise Prescription - 08/17/23 0900       Date of Initial Exercise RX and Referring Provider   Date 08/17/23    Referring Provider Freddy Blinks MD      Treadmill   MPH 1.5    Grade 0    Minutes 15    METs 2.23      NuStep   Level 2    SPM 50    Minutes 15    METs 1.9      Prescription Details   Frequency (times per week) 2    Duration Progress to 30 minutes of continuous aerobic without signs/symptoms of physical distress      Intensity   THRR 40-80% of Max Heartrate 108-134    Ratings of Perceived Exertion 11-13    Perceived Dyspnea 0-4      Progression   Progression Continue to progress workloads to maintain intensity without signs/symptoms of physical distress.      Resistance Training   Training Prescription Yes    Weight 4    Reps 10-15          Perform Capillary Blood Glucose checks as needed.  Exercise Prescription Changes:   Exercise Prescription Changes     Row Name 08/17/23 0900             Response to Exercise   Blood Pressure (Admit) 90/60       Blood Pressure (Exercise) 106/62       Blood Pressure (Exit) 98/60       Heart Rate (Admit) 81 bpm       Heart Rate (Exercise) 92 bpm       Heart Rate (Exit) 83 bpm       Oxygen Saturation (Admit) 92 %       Oxygen Saturation (Exercise) 88 %       Oxygen Saturation (Exit) 93 %       Rating of Perceived Exertion (Exercise) 12       Perceived Dyspnea (Exercise) 1          Exercise Comments:   Exercise Comments  Row Name 08/18/23 1033           Exercise Comments First full day of exercise!  Patient was oriented to gym and equipment including functions, settings, policies, and procedures.   Patient's individual exercise prescription and treatment plan were reviewed.  All starting workloads were established based on the results of the 6 minute walk test done at initial orientation visit.  The plan for exercise progression was also introduced and progression will be customized based on patient's performance and goals.          Exercise Goals and Review:   Exercise Goals     Row Name 08/17/23 [id number]             Exercise Goals   Increase Physical Activity Yes       Intervention Provide advice, education, support and counseling about physical activity/exercise needs.;Develop an individualized exercise prescription for aerobic and resistive training based on initial evaluation findings, risk stratification, comorbidities and participant's personal goals.       Expected Outcomes Long Term: Add in home exercise to make exercise part of routine and to increase amount of physical activity.;Short Term: Attend rehab on a regular basis to increase amount of physical activity.;Long Term: Exercising regularly at least 3-5 days a week.       Increase Strength and Stamina Yes       Intervention Provide advice, education, support and counseling about physical activity/exercise needs.;Develop an individualized exercise prescription for aerobic and resistive training based on initial evaluation findings, risk stratification, comorbidities and participant's personal goals.       Expected Outcomes Short Term: Increase workloads from initial exercise prescription for resistance, speed, and METs.;Short Term: Perform resistance training exercises routinely during rehab and add in resistance training at home;Long Term: Improve cardiorespiratory fitness, muscular endurance and strength as measured by increased METs and functional capacity ( )       Able to understand and use rate of perceived exertion (RPE) scale Yes       Intervention Provide education and explanation on how to use RPE scale        Expected Outcomes Short Term: Able to use RPE daily in rehab to express subjective intensity level;Long Term:  Able to use RPE to guide intensity level when exercising independently       Able to understand and use Dyspnea scale Yes       Intervention Provide education and explanation on how to use Dyspnea scale       Expected Outcomes Short Term: Able to use Dyspnea scale daily in rehab to express subjective sense of shortness of breath during exertion;Long Term: Able to use Dyspnea scale to guide intensity level when exercising independently       Knowledge and understanding of Target Heart Rate Range (THRR) Yes       Intervention Provide education and explanation of THRR including how the numbers were predicted and where they are located for reference       Expected Outcomes Short Term: Able to state/look up THRR;Long Term: Able to use THRR to govern intensity when exercising independently;Short Term: Able to use daily as guideline for intensity in rehab       Able to check pulse independently Yes       Intervention Provide education and demonstration on how to check pulse in carotid and radial arteries.;Review the importance of being able to check your own pulse for safety during independent exercise       Expected Outcomes Short  Term: Able to explain why pulse checking is important during independent exercise;Long Term: Able to check pulse independently and accurately       Understanding of Exercise Prescription Yes       Intervention Provide education, explanation, and written materials on patient's individual exercise prescription       Expected Outcomes Short Term: Able to explain program exercise prescription;Long Term: Able to explain home exercise prescription to exercise independently          Exercise Goals Re-Evaluation :  Exercise Goals Re-Evaluation     Row Name 08/18/23 1033             Exercise Goal Re-Evaluation   Exercise Goals Review Knowledge and understanding of  Target Heart Rate Range (THRR);Able to understand and use rate of perceived exertion (RPE) scale       Comments Reviewed RPE and dyspnea scale, THR and program prescription with pt today.  Pt voiced understanding and was given a copy of goals to take home.       Expected Outcomes Short: Use RPE daily to regulate intensity.  Long: Follow program prescription in THR.          Discharge Exercise Prescription (Final Exercise Prescription Changes):  Exercise Prescription Changes - 08/17/23 0900       Response to Exercise   Blood Pressure (Admit) 90/60    Blood Pressure (Exercise) 106/62    Blood Pressure (Exit) 98/60    Heart Rate (Admit) 81 bpm    Heart Rate (Exercise) 92 bpm    Heart Rate (Exit) 83 bpm    Oxygen Saturation (Admit) 92 %    Oxygen Saturation (Exercise) 88 %    Oxygen Saturation (Exit) 93 %    Rating of Perceived Exertion (Exercise) 12    Perceived Dyspnea (Exercise) 1          Nutrition:  Target Goals: Understanding of nutrition guidelines, daily intake of sodium 1500mg , cholesterol 200mg , calories 30% from fat and 7% or less from saturated fats, daily to have 5 or more servings of fruits and vegetables.  Biometrics:  Pre Biometrics - 08/17/23 0943       Pre Biometrics   Height 5' 5.5 (1.664 m)    Weight 167 lb 1.7 oz (75.8 kg)    Waist Circumference 36 inches    Hip Circumference 37.5 inches    Waist to Hip Ratio 0.96 %    BMI (Calculated) 27.38    Grip Strength 17.4 kg           Nutrition Therapy Plan and Nutrition Goals:   Nutrition Assessments:  MEDIFICTS Score Key: >=70 Need to make dietary changes  40-70 Heart Healthy Diet <= 40 Therapeutic Level Cholesterol Diet  Flowsheet Row PULMONARY REHAB OTHER RESP ORIENTATION from 08/17/2023 in [facility] REH[facility]ITATION  Picture Your Plate Total Score on Admission 47   Picture Your Plate Scores: <59 Unhealthy dietary pattern with much room for improvement. 41-50 Dietary pattern unlikely  to meet recommendations for good health and room for improvement. 51-60 More healthful dietary pattern, with some room for improvement.  >60 Healthy dietary pattern, although there may be some specific behaviors that could be improved.    Nutrition Goals Re-Evaluation:   Nutrition Goals Discharge (Final Nutrition Goals Re-Evaluation):   Psychosocial: Target Goals: Acknowledge presence or absence of significant depression and/or stress, maximize coping skills, provide positive support system. Participant is able to verbalize types and ability to use techniques and skills needed for reducing stress  and depression.  Initial Review & Psychosocial Screening:  Initial Psych Review & Screening - 08/17/23 1008       Initial Review   Current issues with Current Sleep Concerns;Current Psychotropic Meds      Family Dynamics   Good Support System? Yes      Barriers   Psychosocial barriers to participate in program There are no identifiable barriers or psychosocial needs.      Screening Interventions   Interventions Encouraged to exercise;To provide support and resources with identified psychosocial needs;Provide feedback about the scores to participant    Expected Outcomes Short Term goal: Utilizing psychosocial counselor, staff and physician to assist with identification of specific Stressors or current issues interfering with healing process. Setting desired goal for each stressor or current issue identified.;Long Term Goal: Stressors or current issues are controlled or eliminated.;Short Term goal: Identification and review with participant of any Quality of Life or Depression concerns found by scoring the questionnaire.;Long Term goal: The participant improves quality of Life and PHQ9 Scores as seen by post scores and/or verbalization of changes          Quality of Life Scores:  Scores of 19 and below usually indicate a poorer quality of life in these areas.  A difference of  2-3 points  is a clinically meaningful difference.  A difference of 2-3 points in the total score of the Quality of Life Index has been associated with significant improvement in overall quality of life, self-image, physical symptoms, and general health in studies assessing change in quality of life.   PHQ-9: Review Flowsheet  More data exists      08/17/2023 04/21/2023 01/28/2023 10/21/2022 08/04/2022  Depression screen PHQ 2/9  Decreased Interest 0 0 0 0 0  Down, Depressed, Hopeless 0 0 0 0 0  PHQ - 2 Score 0 0 0 0 0  Altered sleeping 3 0 - - -  Tired, decreased energy 1 0 - - -  Change in appetite 2 0 - - -  Feeling bad or failure about yourself  0 0 - - -  Trouble concentrating 0 0 - - -  Moving slowly or fidgety/restless 0 0 - - -  Suicidal thoughts 0 0 - - -  PHQ-9 Score 6 0 - - -  Difficult doing work/chores Somewhat difficult Not difficult at all - - -   Interpretation of Total Score  Total Score Depression Severity:  1-4 = Minimal depression, 5-9 = Mild depression, 10-14 = Moderate depression, 15-19 = Moderately severe depression, 20-27 = Severe depression   Psychosocial Evaluation and Intervention:  Psychosocial Evaluation - 08/17/23 1009       Psychosocial Evaluation & Interventions   Interventions Stress management education;Relaxation education;Encouraged to exercise with the program and follow exercise prescription    Comments Patient was referred to PR with chronic systolic HF from [facility]. His initial PHQ-9 score was 6 due to sleep issues and lack of energy. He says he is taking Lexapro  for anxiety which works very well. He has difficulty falling and staying asleep. He has been diagnosed with OSA but refuses to wear a CPAP. He says he has to get up frequently at nigth with nocturia and feels a CPAP would not help this. He denies any depression of stressors. He lives with his wife who is his main support along with his sister and neices and nephews. His goals for the program are to be able  to do all the activities he wants to do; improve  his breathing and energy.    Expected Outcomes Short Term: Patient will start the program and attend consistently. Long Term: Patient will complete the program meeting personal goals.    Continue Psychosocial Services  No Follow up required          Psychosocial Re-Evaluation:   Psychosocial Discharge (Final Psychosocial Re-Evaluation):    Education: Education Goals: Education classes will be provided on a weekly basis, covering required topics. Participant will state understanding/return demonstration of topics presented.  Learning Barriers/Preferences:  Learning Barriers/Preferences - 08/17/23 9166       Learning Barriers/Preferences   Learning Barriers None    Learning Preferences Skilled Demonstration          Education Topics: How Lungs Work and Diseases: - Discuss the anatomy of the lungs and diseases that can affect the lungs, such as COPD.   Exercise: -Discuss the importance of exercise, FITT principles of exercise, normal and abnormal responses to exercise, and how to exercise safely.   Environmental Irritants: -Discuss types of environmental irritants and how to limit exposure to environmental irritants.   Meds/Inhalers and oxygen: - Discuss respiratory medications, definition of an inhaler and oxygen, and the proper way to use an inhaler and oxygen.   Energy Saving Techniques: - Discuss methods to conserve energy and decrease shortness of breath when performing activities of daily living.  Flowsheet Row PULMONARY REHAB OTHER RESPIRATORY from 08/27/2023 in [facility] CARDIAC REHABILITATION  Date 08/20/23  Educator HB  Instruction Review Code 1- Verbalizes Understanding    Bronchial Hygiene / Breathing Techniques: - Discuss breathing mechanics, pursed-lip breathing technique,  proper posture, effective ways to clear airways, and other functional breathing techniques   Cleaning Equipment: - Provides  group verbal and written instruction about the health risks of elevated stress, cause of high stress, and healthy ways to reduce stress.   Nutrition I: Fats: - Discuss the types of cholesterol, what cholesterol does to the body, and how cholesterol levels can be controlled.   Nutrition II: Labels: -Discuss the different components of food labels and how to read food labels.   Respiratory Infections: - Discuss the signs and symptoms of respiratory infections, ways to prevent respiratory infections, and the importance of seeking medical treatment when having a respiratory infection.   Stress I: Signs and Symptoms: - Discuss the causes of stress, how stress may lead to anxiety and depression, and ways to limit stress.   Stress II: Relaxation: -Discuss relaxation techniques to limit stress.   Oxygen for Home/Travel: - Discuss how to prepare for travel when on oxygen and proper ways to transport and store oxygen to ensure safety.   Knowledge Questionnaire Score:  Knowledge Questionnaire Score - 08/17/23 1006       Knowledge Questionnaire Score   Pre Score 9/18          Core Components/Risk Factors/Patient Goals at Admission:  Personal Goals and Risk Factors at Admission - 08/17/23 1004       Core Components/Risk Factors/Patient Goals on Admission    Weight Management Weight Maintenance    Improve shortness of breath with ADL's Yes    Intervention Provide education, individualized exercise plan and daily activity instruction to help decrease symptoms of SOB with activities of daily living.    Expected Outcomes Short Term: Improve cardiorespiratory fitness to achieve a reduction of symptoms when performing ADLs;Long Term: Be able to perform more ADLs without symptoms or delay the onset of symptoms    Heart Failure Yes  Intervention Provide a combined exercise and nutrition program that is supplemented with education, support and counseling about heart failure. Directed  toward relieving symptoms such as shortness of breath, decreased exercise tolerance, and extremity edema.    Expected Outcomes Improve functional capacity of life;Short term: Daily weights obtained and reported for increase. Utilizing diuretic protocols set by physician.;Long term: Adoption of self-care skills and reduction of barriers for early signs and symptoms recognition and intervention leading to self-care maintenance.;Short term: Attendance in program 2-3 days a week with increased exercise capacity. Reported lower sodium intake. Reported increased fruit and vegetable intake. Reports medication compliance.    Hypertension Yes    Intervention Provide education on lifestyle modifcations including regular physical activity/exercise, weight management, moderate sodium restriction and increased consumption of fresh fruit, vegetables, and low fat dairy, alcohol moderation, and smoking cessation.;Monitor prescription use compliance.    Expected Outcomes Short Term: Continued assessment and intervention until BP is < 140/2mm HG in hypertensive participants. < 130/55mm HG in hypertensive participants with diabetes, heart failure or chronic kidney disease.;Long Term: Maintenance of blood pressure at goal levels.    Lipids Yes    Intervention Provide education and support for participant on nutrition & aerobic/resistive exercise along with prescribed medications to achieve LDL 70mg , HDL >40mg .    Expected Outcomes Short Term: Participant states understanding of desired cholesterol values and is compliant with medications prescribed. Participant is following exercise prescription and nutrition guidelines.;Long Term: Cholesterol controlled with medications as prescribed, with individualized exercise RX and with personalized nutrition plan. Value goals: LDL < 70mg , HDL > 40 mg.          Core Components/Risk Factors/Patient Goals Review:    Core Components/Risk Factors/Patient Goals at Discharge (Final  Review):    ITP Comments:  ITP Comments     Row Name 08/18/23 1033 09/09/23 0920         ITP Comments First full day of exercise!  Patient was oriented to gym and equipment including functions, settings, policies, and procedures.  Patient's individual exercise prescription and treatment plan were reviewed.  All starting workloads were established based on the results of the 6 minute walk test done at initial orientation visit.  The plan for exercise progression was also introduced and progression will be customized based on patient's performance and goals. 30 day review completed. ITP sent to Dr.Jehanzeb Memon, Medical Director of  Pulmonary Rehab. Continue with ITP unless changes are made by physician.  New to program         Comments: 30 day review

## 2023-09-10 ENCOUNTER — Encounter (HOSPITAL_COMMUNITY)
Admission: RE | Admit: 2023-09-10 | Discharge: 2023-09-10 | Disposition: A | Discharge status: Home / Self Care | Source: Ambulatory Visit

## 2023-09-10 DIAGNOSIS — I5022 Chronic systolic (congestive) heart failure: Secondary | ICD-10-CM | POA: Diagnosis not present

## 2023-09-10 NOTE — Progress Notes (Signed)
 Daily Session Note  Patient Details  Name: Terry Pacheco MRN: [id number] Date of Birth: Oct 07, 1950 Referring Provider:   Flowsheet Row PULMONARY REHAB OTHER RESP ORIENTATION from 08/17/2023 in [facility] CARDIAC REHABILITATION  Referring Provider Freddy Blinks MD    Encounter Date: 09/10/2023  Check In:  Session Check In - 09/10/23 1033       Check-In   Supervising physician immediately available to respond to emergencies See telemetry face sheet for immediately available MD    Location [facility]    Staff Present Powell Benders, BS, Exercise Physiologist;Brodyn Depuy Vonzell, MA, RCEP, CCRP, CCET;Hillary Troutman BSN, RN    Virtual Visit No    Medication changes reported     No    Fall or balance concerns reported    No    Warm-up and Cool-down Performed on first and last piece of equipment    Resistance Training Performed Yes    VAD Patient? No    PAD/SET Patient? No      Pain Assessment   Currently in Pain? No/denies          Capillary Blood Glucose: No results found for this or any previous visit (from the past 24 hours).    Social History   Tobacco Use  Smoking Status Former   Current packs/day: 0.00   Average packs/day: 0.5 packs/day for 45.0 years (22.5 ttl pk-yrs)   Types: Cigarettes   Start date: 01/12/1983   Quit date: 11/17/2016   Years since quitting: 6.8  Smokeless Tobacco Never    Goals Met:  Proper associated with RPD/PD & O2 Sat Using PLB without cueing & demonstrates good technique Exercise tolerated well No report of concerns or symptoms today Strength training completed today  Goals Unmet:  Not Applicable  Comments: Pt able to follow exercise prescription today without complaint.  Will continue to monitor for progression.

## 2023-09-15 ENCOUNTER — Encounter (HOSPITAL_COMMUNITY)
Admission: RE | Admit: 2023-09-15 | Discharge: 2023-09-15 | Disposition: A | Discharge status: Home / Self Care | Source: Ambulatory Visit

## 2023-09-15 DIAGNOSIS — I10 Essential (primary) hypertension: Secondary | ICD-10-CM | POA: Diagnosis not present

## 2023-09-15 DIAGNOSIS — Q676 Pectus excavatum: Secondary | ICD-10-CM | POA: Diagnosis not present

## 2023-09-15 DIAGNOSIS — N401 Enlarged prostate with lower urinary tract symptoms: Secondary | ICD-10-CM | POA: Diagnosis not present

## 2023-09-15 DIAGNOSIS — I2510 Atherosclerotic heart disease of native coronary artery without angina pectoris: Secondary | ICD-10-CM | POA: Diagnosis not present

## 2023-09-15 DIAGNOSIS — K219 Gastro-esophageal reflux disease without esophagitis: Secondary | ICD-10-CM | POA: Diagnosis not present

## 2023-09-15 DIAGNOSIS — J42 Unspecified chronic bronchitis: Secondary | ICD-10-CM | POA: Diagnosis not present

## 2023-09-15 DIAGNOSIS — I5022 Chronic systolic (congestive) heart failure: Secondary | ICD-10-CM | POA: Diagnosis not present

## 2023-09-15 DIAGNOSIS — I350 Nonrheumatic aortic (valve) stenosis: Secondary | ICD-10-CM | POA: Diagnosis not present

## 2023-09-15 DIAGNOSIS — E785 Hyperlipidemia, unspecified: Secondary | ICD-10-CM | POA: Diagnosis not present

## 2023-09-15 DIAGNOSIS — E039 Hypothyroidism, unspecified: Secondary | ICD-10-CM | POA: Diagnosis not present

## 2023-09-15 DIAGNOSIS — F411 Generalized anxiety disorder: Secondary | ICD-10-CM | POA: Diagnosis not present

## 2023-09-15 DIAGNOSIS — I509 Heart failure, unspecified: Secondary | ICD-10-CM | POA: Diagnosis not present

## 2023-09-15 DIAGNOSIS — C099 Malignant neoplasm of tonsil, unspecified: Secondary | ICD-10-CM | POA: Diagnosis not present

## 2023-09-15 NOTE — Progress Notes (Signed)
 Daily Session Note  Patient Details  Name: Terry Pacheco MRN: [id number] Date of Birth: 12-27-1950 Referring Provider:   Flowsheet Row PULMONARY REHAB OTHER RESP ORIENTATION from 08/17/2023 in [facility] CARDIAC REHABILITATION  Referring Provider Freddy Blinks MD    Encounter Date: 09/15/2023  Check In:  Session Check In - 09/15/23 1048       Check-In   Supervising physician immediately available to respond to emergencies See telemetry face sheet for immediately available MD    Location [facility]    Staff Present Powell Benders, BS, Exercise Physiologist;Travion Ke Vonzell, MA, RCEP, CCRP, CCET;Brittany Jackquline, BSN, RN, WTA-C    Virtual Visit No    Medication changes reported     No    Fall or balance concerns reported    No    Warm-up and Cool-down Performed on first and last piece of equipment    Resistance Training Performed Yes    VAD Patient? No    PAD/SET Patient? No      Pain Assessment   Currently in Pain? No/denies          Capillary Blood Glucose: No results found for this or any previous visit (from the past 24 hours).    Social History   Tobacco Use  Smoking Status Former   Current packs/day: 0.00   Average packs/day: 0.5 packs/day for 45.0 years (22.5 ttl pk-yrs)   Types: Cigarettes   Start date: 01/12/1983   Quit date: 11/17/2016   Years since quitting: 6.8  Smokeless Tobacco Never    Goals Met:  Proper associated with RPD/PD & O2 Sat Independence with exercise equipment Using PLB without cueing & demonstrates good technique Exercise tolerated well No report of concerns or symptoms today Strength training completed today  Goals Unmet:  Not Applicable  Comments: Pt able to follow exercise prescription today without complaint.  Will continue to monitor for progression.

## 2023-09-17 ENCOUNTER — Encounter (HOSPITAL_COMMUNITY): Admission: RE | Admit: 2023-09-17 | Source: Ambulatory Visit

## 2023-09-17 DIAGNOSIS — Z923 Personal history of irradiation: Secondary | ICD-10-CM | POA: Diagnosis not present

## 2023-09-17 DIAGNOSIS — C099 Malignant neoplasm of tonsil, unspecified: Secondary | ICD-10-CM | POA: Diagnosis not present

## 2023-09-17 DIAGNOSIS — H6123 Impacted cerumen, bilateral: Secondary | ICD-10-CM | POA: Diagnosis not present

## 2023-09-20 ENCOUNTER — Other Ambulatory Visit: Payer: Self-pay | Admitting: Family Medicine

## 2023-09-20 DIAGNOSIS — F411 Generalized anxiety disorder: Secondary | ICD-10-CM

## 2023-09-22 ENCOUNTER — Encounter (HOSPITAL_COMMUNITY)
Admission: RE | Admit: 2023-09-22 | Discharge: 2023-09-22 | Disposition: A | Discharge status: Home / Self Care | Source: Ambulatory Visit

## 2023-09-22 DIAGNOSIS — I5022 Chronic systolic (congestive) heart failure: Secondary | ICD-10-CM

## 2023-09-22 NOTE — Progress Notes (Signed)
 Daily Session Note  Patient Details  Name: Terry Pacheco MRN: [id number] Date of Birth: [birth year] Referring Provider:   Flowsheet Row PULMONARY REHAB OTHER RESP ORIENTATION from 08/17/2023 in [facility] CARDIAC REHABILITATION  Referring Provider Freddy Blinks MD    Encounter Date: 09/22/2023  Check In:  Session Check In - 09/22/23 1004       Check-In   Supervising physician immediately available to respond to emergencies See telemetry face sheet for immediately available MD    Location [facility]    Staff Present Laymon Rattler, BSN, RN, Rosalba Gelineau, MA, RCEP, CCRP, CCET    Virtual Visit No    Medication changes reported     No    Fall or balance concerns reported    No    Tobacco Cessation No Change    Warm-up and Cool-down Performed on first and last piece of equipment    Resistance Training Performed Yes    VAD Patient? No    PAD/SET Patient? No      Pain Assessment   Currently in Pain? No/denies          Capillary Blood Glucose: No results found for this or any previous visit (from the past 24 hours).    Social History   Tobacco Use  Smoking Status Former   Current packs/day: 0.00   Average packs/day: 0.5 packs/day for 45.0 years (22.5 ttl pk-yrs)   Types: Cigarettes   Start date: 01/12/1983   Quit date: 11/17/2016   Years since quitting: 6.8  Smokeless Tobacco Never    Goals Met:  Proper associated with RPD/PD & O2 Sat Independence with exercise equipment Improved SOB with ADL's Using PLB without cueing & demonstrates good technique Exercise tolerated well No report of concerns or symptoms today Strength training completed today  Goals Unmet:  Not Applicable  Comments: Pt able to follow exercise prescription today without complaint.  Will continue to monitor for progression.

## 2023-09-24 ENCOUNTER — Encounter (HOSPITAL_COMMUNITY)
Admission: RE | Admit: 2023-09-24 | Discharge: 2023-09-24 | Disposition: A | Discharge status: Home / Self Care | Source: Ambulatory Visit

## 2023-09-24 DIAGNOSIS — I5022 Chronic systolic (congestive) heart failure: Secondary | ICD-10-CM | POA: Diagnosis not present

## 2023-09-24 NOTE — Progress Notes (Signed)
 Daily Session Note  Patient Details  Name: Terry Pacheco MRN: [id number] Date of Birth: 04-Feb-1951 Referring Provider:   Flowsheet Row PULMONARY REHAB OTHER RESP ORIENTATION from 08/17/2023 in [facility] CARDIAC REHABILITATION  Referring Provider Freddy Blinks MD    Encounter Date: 09/24/2023  Check In:  Session Check In - 09/24/23 1015       Check-In   Supervising physician immediately available to respond to emergencies See telemetry face sheet for immediately available MD    Location [facility]    Staff Present Adrien Louder, RN, BSN;Jessica [place], MA, RCEP, CCRP, CCET;Mary [place], RN, BSN, MA    Virtual Visit No    Medication changes reported     No    Fall or balance concerns reported    No    Warm-up and Cool-down Performed on first and last piece of equipment    Resistance Training Performed Yes    VAD Patient? No    PAD/SET Patient? No      Pain Assessment   Currently in Pain? No/denies    Multiple Pain Sites No          Capillary Blood Glucose: No results found for this or any previous visit (from the past 24 hours).    Social History   Tobacco Use  Smoking Status Former   Current packs/day: 0.00   Average packs/day: 0.5 packs/day for 45.0 years (22.5 ttl pk-yrs)   Types: Cigarettes   Start date: 01/12/1983   Quit date: 11/17/2016   Years since quitting: 6.8  Smokeless Tobacco Never    Goals Met:  Proper associated with RPD/PD & O2 Sat Independence with exercise equipment Using PLB without cueing & demonstrates good technique Exercise tolerated well No report of concerns or symptoms today Strength training completed today  Goals Unmet:  Not Applicable  Comments: Pt able to follow exercise prescription today without complaint.  Will continue to monitor for progression.SABRA

## 2023-09-29 ENCOUNTER — Encounter (HOSPITAL_COMMUNITY)
Admission: RE | Admit: 2023-09-29 | Discharge: 2023-09-29 | Disposition: A | Discharge status: Home / Self Care | Source: Ambulatory Visit

## 2023-09-29 DIAGNOSIS — I5022 Chronic systolic (congestive) heart failure: Secondary | ICD-10-CM | POA: Diagnosis not present

## 2023-09-29 NOTE — Progress Notes (Signed)
 Daily Session Note  Patient Details  Name: Terry Pacheco MRN: [id number] Date of Birth: 11/01/1950 Referring Provider:   Flowsheet Row PULMONARY REHAB OTHER RESP ORIENTATION from 08/17/2023 in [facility] CARDIAC REHABILITATION  Referring Provider Freddy Blinks MD    Encounter Date: 09/29/2023  Check In:  Session Check In - 09/29/23 1031       Check-In   Supervising physician immediately available to respond to emergencies See telemetry face sheet for immediately available MD    Location [facility]    Staff Present Laymon Rattler, BSN, RN, WTA-C;Alleya Demeter Con, BS, Exercise Physiologist;Jessica Vonzell, MA, RCEP, CCRP, CCET    Virtual Visit No    Medication changes reported     No    Fall or balance concerns reported    No    Tobacco Cessation No Change    Warm-up and Cool-down Performed on first and last piece of equipment    Resistance Training Performed Yes    VAD Patient? No    PAD/SET Patient? No      Pain Assessment   Currently in Pain? No/denies    Multiple Pain Sites No          Capillary Blood Glucose: No results found for this or any previous visit (from the past 24 hours).    Social History   Tobacco Use  Smoking Status Former   Current packs/day: 0.00   Average packs/day: 0.5 packs/day for 45.0 years (22.5 ttl pk-yrs)   Types: Cigarettes   Start date: 01/12/1983   Quit date: 11/17/2016   Years since quitting: 6.8  Smokeless Tobacco Never    Goals Met:  Independence with exercise equipment Exercise tolerated well No report of concerns or symptoms today Strength training completed today  Goals Unmet:  Not Applicable  Comments: Pt able to follow exercise prescription today without complaint.  Will continue to monitor for progression.

## 2023-09-30 DIAGNOSIS — N401 Enlarged prostate with lower urinary tract symptoms: Secondary | ICD-10-CM | POA: Diagnosis not present

## 2023-10-01 ENCOUNTER — Encounter (HOSPITAL_COMMUNITY)
Admission: RE | Admit: 2023-10-01 | Discharge: 2023-10-01 | Disposition: A | Discharge status: Home / Self Care | Source: Ambulatory Visit

## 2023-10-01 DIAGNOSIS — I5022 Chronic systolic (congestive) heart failure: Secondary | ICD-10-CM

## 2023-10-01 NOTE — Progress Notes (Signed)
 Daily Session Note  Patient Details  Name: Terry Pacheco MRN: [id number] Date of Birth: Aug 15, 1950 Referring Provider:   Flowsheet Row PULMONARY REHAB OTHER RESP ORIENTATION from 08/17/2023 in [facility] CARDIAC REHABILITATION  Referring Provider Freddy Blinks MD    Encounter Date: 10/01/2023  Check In:  Session Check In - 10/01/23 1030       Check-In   Supervising physician immediately available to respond to emergencies See telemetry face sheet for immediately available MD    Location [facility]    Staff Present Powell Benders, BS, Exercise Physiologist;Mary Idell Glen, RN, BSN, Randie Gelineau, MA, RCEP, CCRP, CCET    Virtual Visit No    Medication changes reported     No    Fall or balance concerns reported    No    Tobacco Cessation No Change    Warm-up and Cool-down Performed on first and last piece of equipment    Resistance Training Performed Yes    VAD Patient? No    PAD/SET Patient? No      Pain Assessment   Currently in Pain? No/denies    Multiple Pain Sites No          Capillary Blood Glucose: No results found for this or any previous visit (from the past 24 hours).    Social History   Tobacco Use  Smoking Status Former   Current packs/day: 0.00   Average packs/day: 0.5 packs/day for 45.0 years (22.5 ttl pk-yrs)   Types: Cigarettes   Start date: 01/12/1983   Quit date: 11/17/2016   Years since quitting: 6.8  Smokeless Tobacco Never    Goals Met:  Independence with exercise equipment Exercise tolerated well No report of concerns or symptoms today Strength training completed today  Goals Unmet:  Not Applicable  Comments: Pt able to follow exercise prescription today without complaint.  Will continue to monitor for progression.

## 2023-10-06 ENCOUNTER — Encounter (HOSPITAL_COMMUNITY)
Admission: RE | Admit: 2023-10-06 | Discharge: 2023-10-06 | Disposition: A | Discharge status: Home / Self Care | Source: Ambulatory Visit

## 2023-10-06 DIAGNOSIS — I5022 Chronic systolic (congestive) heart failure: Secondary | ICD-10-CM | POA: Diagnosis not present

## 2023-10-06 NOTE — Progress Notes (Signed)
 Daily Session Note  Patient Details  Name: HAMZAH SAVOCA MRN: [id number] Date of Birth: 08-16-1950 Referring Provider:   Flowsheet Row PULMONARY REHAB OTHER RESP ORIENTATION from 08/17/2023 in [facility] CARDIAC REHABILITATION  Referring Provider Freddy Blinks MD    Encounter Date: 10/06/2023  Check In:  Session Check In - 10/06/23 1029       Check-In   Supervising physician immediately available to respond to emergencies See telemetry face sheet for immediately available MD    Location [facility]    Staff Present Powell Benders, BS, Exercise Physiologist;Uma Jerde Zina, RN;Brittany Jackquline, BSN, RN, Estrella Daring, BS, RRT, CPFT    Virtual Visit No    Medication changes reported     No    Fall or balance concerns reported    No    Warm-up and Cool-down Performed on first and last piece of equipment    Resistance Training Performed Yes    VAD Patient? No    PAD/SET Patient? No      Pain Assessment   Currently in Pain? No/denies          Capillary Blood Glucose: No results found for this or any previous visit (from the past 24 hours).    Social History   Tobacco Use  Smoking Status Former   Current packs/day: 0.00   Average packs/day: 0.5 packs/day for 45.0 years (22.5 ttl pk-yrs)   Types: Cigarettes   Start date: 01/12/1983   Quit date: 11/17/2016   Years since quitting: 6.8  Smokeless Tobacco Never    Goals Met:  Proper associated with RPD/PD & O2 Sat Independence with exercise equipment Using PLB without cueing & demonstrates good technique Exercise tolerated well No report of concerns or symptoms today Strength training completed today  Goals Unmet:  Not Applicable  Comments: Pt able to follow exercise prescription today without complaint.  Will continue to monitor for progression.

## 2023-10-07 ENCOUNTER — Encounter (HOSPITAL_COMMUNITY): Payer: Self-pay | Admitting: *Deleted

## 2023-10-07 DIAGNOSIS — R3914 Feeling of incomplete bladder emptying: Secondary | ICD-10-CM | POA: Diagnosis not present

## 2023-10-07 DIAGNOSIS — N401 Enlarged prostate with lower urinary tract symptoms: Secondary | ICD-10-CM | POA: Diagnosis not present

## 2023-10-07 DIAGNOSIS — I5022 Chronic systolic (congestive) heart failure: Secondary | ICD-10-CM

## 2023-10-07 DIAGNOSIS — R3912 Poor urinary stream: Secondary | ICD-10-CM | POA: Diagnosis not present

## 2023-10-07 NOTE — Progress Notes (Signed)
 Pulmonary Individual Treatment Plan  Patient Details  Name: Terry Pacheco MRN: [id number] Date of Birth: December 15, 1950 Referring Provider:   Flowsheet Row PULMONARY REHAB OTHER RESP ORIENTATION from 08/17/2023 in [facility]ARDI[facility] REH[facility]ILITATION  Referring Provider Freddy Blinks MD    Initial Encounter Date:  Flowsheet Row PULMONARY REHAB OTHER RESP ORIENTATION from 08/17/2023 in [place] CARDIAC RE[facility]  Date 08/17/23    Visit Diagnosis: Heart failure, chronic systolic (HCC)  Patient's Home Medications on Admission:   Current Outpatient Medications:    acetaminophen  (TYLENOL ) 500 MG tablet, Take 1,000 mg by mouth daily as needed for moderate pain or headache., Disp: , Rfl:    albuterol  (ACCUNEB ) 1.25 MG/3ML nebulizer solution, Take 3 mLs (1.25 mg total) by nebulization every 6 (six) hours as needed for wheezing., Disp: 75 mL, Rfl: 12   amLODipine  (NORVASC ) 10 MG tablet, TAKE 1 TABLET EVERY DAY FOR FOR BLOOD PRESSURE, Disp: 90 tablet, Rfl: 3   amoxicillin -clavulanate (AUGMENTIN ) 875-125 MG tablet, Take 1 tablet by mouth 2 (two) times daily. Take all of this medication (Patient not taking: Reported on 08/17/2023), Disp: 20 tablet, Rfl: 0   aspirin  EC 81 MG tablet, Take 81 mg by mouth daily., Disp: , Rfl:    atorvastatin  (LIPITOR) 40 MG tablet, Take 1 tablet (40 mg total) by mouth daily., Disp: 90 tablet, Rfl: 3   B Complex CAPS, TAKE 1 CAPSULE BY MOUTH EVERY DAY, Disp: 100 capsule, Rfl: 2   calcium  carbonate (TUMS - DOSED IN MG ELEMENTAL CALCIUM ) 500 MG chewable tablet, Chew 2 tablets by mouth daily as needed for indigestion or heartburn., Disp: , Rfl:    empagliflozin (JARDIANCE) 10 MG TABS tablet, Take 1 tablet by mouth daily., Disp: , Rfl:    escitalopram  (LEXAPRO ) 20 MG tablet, TAKE 1 TABLET BY MOUTH EVERY DAY, Disp: 90 tablet, Rfl: 0   ezetimibe  (ZETIA ) 10 MG tablet, Take 1 tablet (10 mg total) by mouth daily., Disp: 90 tablet, Rfl: 0   fexofenadine -pseudoephedrine  (ALLEGRA-D  24) 180-240 MG 24 hr tablet, Take 1 tablet by mouth every evening. For allergy and congestion (Patient not taking: Reported on 08/17/2023), Disp: 30 tablet, Rfl: 11   finasteride  (PROSCAR ) 5 MG tablet, Take 1 tablet (5 mg total) by mouth daily. For urine flow (Patient not taking: Reported on 08/17/2023), Disp: 90 tablet, Rfl: 3   fluticasone  furoate-vilanterol (BREO ELLIPTA ) 200-25 MCG/ACT AEPB, Inhale 1 puff into the lungs daily., Disp: 1 each, Rfl: 11   Glucosamine 500 MG CAPS, Take by mouth., Disp: , Rfl:    moxifloxacin  (AVELOX ) 400 MG tablet, Take 1 tablet (400 mg total) by mouth daily. (Patient not taking: Reported on 08/17/2023), Disp: 10 tablet, Rfl: 0   omeprazole  (PRILOSEC) 20 MG capsule, TAKE 1 CAPSULE BY MOUTH EVERY DAY, Disp: 90 capsule, Rfl: 3   spironolactone (ALDACTONE) 25 MG tablet, Take 12.5 mg by mouth daily., Disp: , Rfl:    tamsulosin  (FLOMAX ) 0.4 MG CAPS capsule, Take 2 capsules (0.8 mg total) by mouth at bedtime. 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    Hypertension    Sleep apnea    does not use CPAP   Wears glasses     Tobacco Use: Social History   Tobacco Use  Smoking Status Former   Current packs/day: 0.00    Average packs/day: 0.5 packs/day for 45.0 years (22.5 ttl pk-yrs)   Types: Cigarettes   Start date: 01/12/1983   Quit date: 11/17/2016   Years since quitting: 6.8  Smokeless Tobacco Never    Labs: Review Flowsheet  More data exists      Latest Ref Rng & Units 04/18/2021 10/21/2021 04/22/2022 10/21/2022 04/21/2023  Labs for ITP Cardiac and Pulmonary Rehab  Cholestrol 100 - 199 mg/dL 892  889  880  882  895   LDL (calc) 0 - 99 mg/dL 49  45  49  44  44   HDL-C >39 mg/dL 44  54  58  61  47   Trlycerides 0 - 149 mg/dL 65  39  51  51  56     Capillary Blood Glucose: Lab Results  Component Value Date   GLUCAP 93 12/08/2017   GLUCAP 106 (H) 04/29/2017   GLUCAP 105 (H) 12/02/2016     Pulmonary Assessment Scores:  Pulmonary Assessment Scores     Row Name 08/17/23 1008         ADL UCSD   ADL Phase Entry     SOB Score total 31     Rest 0     Walk 1     Stairs 3     Bath 2     Dress 1     Shop 2       CAT Score   CAT Score 15       mMRC Score   mMRC Score 2       UCSD: Self-administered rating of dyspnea associated with activities of daily living (ADLs) 6-point scale (0 = not at all to 5 = maximal or unable to do because of breathlessness)  Scoring Scores range from 0 to 120.  Minimally important difference is 5 units  CAT: CAT can identify the health impairment of COPD patients and is better correlated with disease progression.  CAT has a scoring range of zero to 40. The CAT score is classified into four groups of low (less than 10), medium (10 - 20), high (21-30) and very high (31-40) based on the impact level of disease on health status. A CAT score over 10 suggests significant symptoms.  A worsening CAT score could be explained by an exacerbation, poor medication adherence, poor inhaler technique, or progression of COPD or comorbid conditions.  CAT MCID is 2 points  mMRC: mMRC (Modified Medical Research Council) Dyspnea Scale is used to assess the degree of  baseline functional disability in patients of respiratory disease due to dyspnea. No minimal important difference is established. A decrease in score of 1 point or greater is considered a positive change.   Pulmonary Function Assessment:   Exercise Target Goals: Exercise Program Goal: Individual exercise prescription set using results from initial 6 min walk test and THRR while considering  patient's activity barriers and safety.  Exercise Prescription Goal: Initial exercise prescription builds to 30-45 minutes a day of aerobic activity, 2-3 days per week.  Home exercise guidelines will be given to patient during program as part of exercise prescription that the participant will acknowledge.  Activity Barriers & Risk Stratification:  Activity Barriers & Cardiac Risk Stratification - 08/17/23 0832       Activity Barriers & Cardiac Risk Stratification   Activity Barriers Shortness of Breath;Deconditioning;Muscular Weakness;Right Hip Replacement;Arthritis;Balance Concerns   TRHR 2017.   Cardiac Risk Stratification Moderate          6 Minute Walk:  6 Minute Walk     Row Name 08/17/23 0939         6 Minute Walk   Phase Initial     Distance 1190 feet     Walk Time 6 minutes     # of Rest Breaks 0     MPH 2.25     METS 2.28     RPE 12     Perceived Dyspnea  1     VO2 Peak 7.96     Symptoms No     Resting HR 81 bpm     Resting BP 90/60     Resting Oxygen Saturation  92 %     Exercise Oxygen Saturation  during 6 min walk 88 %     Max Ex. HR 92 bpm     Max Ex. BP 106/62     2 Minute Post BP 98/60       Interval HR   1 Minute HR 88     2 Minute HR 99     3 Minute HR 98     4 Minute HR 92     5 Minute HR 92     6 Minute HR 92     2 Minute Post HR 83     Interval Heart Rate? Yes       Interval Oxygen   Interval Oxygen? Yes     Baseline Oxygen Saturation % 92 %     1 Minute Oxygen Saturation % 92 %     1 Minute Liters of Oxygen 0 L     2 Minute Oxygen Saturation %  90 %     2 Minute Liters of Oxygen 0 L     3 Minute Oxygen Saturation % 89 %     3 Minute Liters of Oxygen 0 L     4 Minute Oxygen Saturation % 88 %     4 Minute Liters of Oxygen 0 L     5 Minute Oxygen Saturation % 90 %     5 Minute Liters of Oxygen 0 L     6 Minute Oxygen Saturation % 89 %     6 Minute Liters of Oxygen 0 L     2 Minute Post Oxygen Saturation % 93 %     2 Minute Post Liters of Oxygen 0 L        Oxygen Initial Assessment:  Oxygen Initial Assessment - 08/17/23 1007       Home Oxygen   Home Oxygen Device None    Sleep Oxygen Prescription None      Initial 6 min Walk   Oxygen Used None      Program Oxygen Prescription   Program Oxygen Prescription None      Intervention   Short Term Goals To learn and understand importance of monitoring SPO2 with pulse oximeter and demonstrate accurate use of  the pulse oximeter.;To learn and understand importance of maintaining oxygen saturations>88%;To learn and demonstrate proper pursed lip breathing techniques or other breathing techniques. ;To learn and demonstrate proper use of respiratory medications    Long  Term Goals Verbalizes importance of monitoring SPO2 with pulse oximeter and return demonstration;Maintenance of O2 saturations>88%;Exhibits proper breathing techniques, such as pursed lip breathing or other method taught during program session;Compliance with respiratory medication;Demonstrates proper use of MDI's          Oxygen Re-Evaluation:  Oxygen Re-Evaluation     Row Name 08/18/23 1034             Goals/Expected Outcomes   Comments Reviewed PLB technique with pt.  Talked about how it works and it's importance in maintaining their exercise saturations.       Goals/Expected Outcomes Short: Become more profiecient at using PLB.   Long: Become independent at using PLB.          Oxygen Discharge (Final Oxygen Re-Evaluation):  Oxygen Re-Evaluation - 08/18/23 1034       Goals/Expected Outcomes    Comments Reviewed PLB technique with pt.  Talked about how it works and it's importance in maintaining their exercise saturations.    Goals/Expected Outcomes Short: Become more profiecient at using PLB.   Long: Become independent at using PLB.          Initial Exercise Prescription:  Initial Exercise Prescription - 08/17/23 0900       Date of Initial Exercise RX and Referring Provider   Date 08/17/23    Referring Provider Freddy Blinks MD      Treadmill   MPH 1.5    Grade 0    Minutes 15    METs 2.23      NuStep   Level 2    SPM 50    Minutes 15    METs 1.9      Prescription Details   Frequency (times per week) 2    Duration Progress to 30 minutes of continuous aerobic without signs/symptoms of physical distress      Intensity   THRR 40-80% of Max Heartrate 108-134    Ratings of Perceived Exertion 11-13    Perceived Dyspnea 0-4      Progression   Progression Continue to progress workloads to maintain intensity without signs/symptoms of physical distress.      Resistance Training   Training Prescription Yes    Weight 4    Reps 10-15          Perform Capillary Blood Glucose checks as needed.  Exercise Prescription Changes:   Exercise Prescription Changes     Row Name 08/17/23 0900 09/08/23 1500 09/24/23 1500         Response to Exercise   Blood Pressure (Admit) 90/60 118/60 124/72     Blood Pressure (Exercise) 106/62 132/72 --     Blood Pressure (Exit) 98/60 112/60 100/74     Heart Rate (Admit) 81 bpm 76 bpm 65 bpm     Heart Rate (Exercise) 92 bpm 86 bpm 80 bpm     Heart Rate (Exit) 83 bpm 78 bpm 74 bpm     Oxygen Saturation (Admit) 92 % 95 % 94 %     Oxygen Saturation (Exercise) 88 % 90 % 92 %     Oxygen Saturation (Exit) 93 % 94 % 93 %     Rating of Perceived Exertion (Exercise) 12 13 12      Perceived Dyspnea (Exercise) 1 2 1  Duration -- Continue with 30 min of aerobic exercise without signs/symptoms of physical distress. Continue with 30  min of aerobic exercise without signs/symptoms of physical distress.     Intensity -- THRR unchanged THRR unchanged       Progression   Progression -- Continue to progress workloads to maintain intensity without signs/symptoms of physical distress. Continue to progress workloads to maintain intensity without signs/symptoms of physical distress.       Resistance Training   Training Prescription -- Yes Yes     Weight -- 4 4     Reps -- 10-15 10-15       Treadmill   MPH -- 2 2     Grade -- 1 0     Minutes -- 15 15     METs -- 2.81 2.53       NuStep   Level -- 4 5     SPM -- 85 81     Minutes -- 15 15     METs -- 2.5 2.6        Exercise Comments:   Exercise Comments     Row Name 08/18/23 1033           Exercise Comments First full day of exercise!  Patient was oriented to gym and equipment including functions, settings, policies, and procedures.  Patient's individual exercise prescription and treatment plan were reviewed.  All starting workloads were established based on the results of the 6 minute walk test done at initial orientation visit.  The plan for exercise progression was also introduced and progression will be customized based on patient's performance and goals.          Exercise Goals and Review:   Exercise Goals     Row Name 08/17/23 [phone]             Exercise Goals   Increase Physical Activity Yes       Intervention Provide advice, education, support and counseling about physical activity/exercise needs.;Develop an individualized exercise prescription for aerobic and resistive training based on initial evaluation findings, risk stratification, comorbidities and participant's personal goals.       Expected Outcomes Long Term: Add in home exercise to make exercise part of routine and to increase amount of physical activity.;Short Term: Attend rehab on a regular basis to increase amount of physical activity.;Long Term: Exercising regularly at least 3-5 days a  week.       Increase Strength and Stamina Yes       Intervention Provide advice, education, support and counseling about physical activity/exercise needs.;Develop an individualized exercise prescription for aerobic and resistive training based on initial evaluation findings, risk stratification, comorbidities and participant's personal goals.       Expected Outcomes Short Term: Increase workloads from initial exercise prescription for resistance, speed, and METs.;Short Term: Perform resistance training exercises routinely during rehab and add in resistance training at home;Long Term: Improve cardiorespiratory fitness, muscular endurance and strength as measured by increased METs and functional capacity ( )       Able to understand and use rate of perceived exertion (RPE) scale Yes       Intervention Provide education and explanation on how to use RPE scale       Expected Outcomes Short Term: Able to use RPE daily in rehab to express subjective intensity level;Long Term:  Able to use RPE to guide intensity level when exercising independently       Able to understand and use Dyspnea scale Yes  Intervention Provide education and explanation on how to use Dyspnea scale       Expected Outcomes Short Term: Able to use Dyspnea scale daily in rehab to express subjective sense of shortness of breath during exertion;Long Term: Able to use Dyspnea scale to guide intensity level when exercising independently       Knowledge and understanding of Target Heart Rate Range (THRR) Yes       Intervention Provide education and explanation of THRR including how the numbers were predicted and where they are located for reference       Expected Outcomes Short Term: Able to state/look up THRR;Long Term: Able to use THRR to govern intensity when exercising independently;Short Term: Able to use daily as guideline for intensity in rehab       Able to check pulse independently Yes       Intervention Provide education and  demonstration on how to check pulse in carotid and radial arteries.;Review the importance of being able to check your own pulse for safety during independent exercise       Expected Outcomes Short Term: Able to explain why pulse checking is important during independent exercise;Long Term: Able to check pulse independently and accurately       Understanding of Exercise Prescription Yes       Intervention Provide education, explanation, and written materials on patient's individual exercise prescription       Expected Outcomes Short Term: Able to explain program exercise prescription;Long Term: Able to explain home exercise prescription to exercise independently          Exercise Goals Re-Evaluation :  Exercise Goals Re-Evaluation     Row Name 08/18/23 1033 10/01/23 1303           Exercise Goal Re-Evaluation   Exercise Goals Review Knowledge and understanding of Target Heart Rate Range (THRR);Able to understand and use rate of perceived exertion (RPE) scale Increase Physical Activity;Increase Strength and Stamina;Understanding of Exercise Prescription      Comments Reviewed RPE and dyspnea scale, THR and program prescription with pt today.  Pt voiced understanding and was given a copy of goals to take home. Ron is doing well in rehab. He is feeling good with exercise and is feeling an increase in his endurance. He is walking some at home due to heat and weather. He has 4lbs hand weights and resistance bands at home that he has been using. He is increasing his levels in rehab by walking speed on the treadmill and increasing level on the nustep. Will continue to monitor and progress as able.      Expected Outcomes Short: Use RPE daily to regulate intensity.  Long: Follow program prescription in THR. Short: increase hand weight lbs if able in the next week or two   long: continue to exercise at [facility] and attend rehab         Discharge Exercise Prescription (Final Exercise Prescription Changes):   Exercise Prescription Changes - 09/24/23 1500       Response to Exercise   Blood Pressure (Admit) 124/72    Blood Pressure (Exit) 100/74    Heart Rate (Admit) 65 bpm    Heart Rate (Exercise) 80 bpm    Heart Rate (Exit) 74 bpm    Oxygen Saturation (Admit) 94 %    Oxygen Saturation (Exercise) 92 %    Oxygen Saturation (Exit) 93 %    Rating of Perceived Exertion (Exercise) 12    Perceived Dyspnea (Exercise) 1    Duration  Continue with 30 min of aerobic exercise without signs/symptoms of physical distress.    Intensity THRR unchanged      Progression   Progression Continue to progress workloads to maintain intensity without signs/symptoms of physical distress.      Resistance Training   Training Prescription Yes    Weight 4    Reps 10-15      Treadmill   MPH 2    Grade 0    Minutes 15    METs 2.53      NuStep   Level 5    SPM 81    Minutes 15    METs 2.6          Nutrition:  Target Goals: Understanding of nutrition guidelines, daily intake of sodium 1500mg , cholesterol 200mg , calories 30% from fat and 7% or less from saturated fats, daily to have 5 or more servings of fruits and vegetables.  Biometrics:  Pre Biometrics - 08/17/23 0943       Pre Biometrics   Height 5' 5.5 (1.664 m)    Weight 167 lb 1.7 oz (75.8 kg)    Waist Circumference 36 inches    Hip Circumference 37.5 inches    Waist to Hip Ratio 0.96 %    BMI (Calculated) 27.38    Grip Strength 17.4 kg           Nutrition Therapy Plan and Nutrition Goals:   Nutrition Assessments:  MEDIFICTS Score Key: >=70 Need to make dietary changes  40-70 Heart Healthy Diet <= 40 Therapeutic Level Cholesterol Diet  Flowsheet Row PULMONARY REHAB OTHER RESP ORIENTATION from 08/17/2023 in [facility] CARDIAC REHABILITATION  Picture Your Plate Total Score on Admission 47   Picture Your Plate Scores: <59 Unhealthy dietary pattern with much room for improvement. 41-50 Dietary pattern unlikely to meet  recommendations for good health and room for improvement. 51-60 More healthful dietary pattern, with some room for improvement.  >60 Healthy dietary pattern, although there may be some specific behaviors that could be improved.    Nutrition Goals Re-Evaluation:  Nutrition Goals Re-Evaluation     Row Name 10/01/23 1309             Goals   Nutrition Goal healthy eating       Comment Ron is doing well in rehab. He did not have a good apitie for a while but has finally got this back. He has gain a few lbs over the part month since getting his apitie back. His wife is ooking healthy options. Ron is a sweets lovers and does enjoy a nutty buddy or 2 a day. He is drinking mostly water, does not drink a lot of soads and only will have tea in a resturant       Expected Outcome Short: cut back on nutty buddies each day   long : continue to drnk water and pick healthy options          Nutrition Goals Discharge (Final Nutrition Goals Re-Evaluation):  Nutrition Goals Re-Evaluation - 10/01/23 1309       Goals   Nutrition Goal healthy eating    Comment Ron is doing well in rehab. He did not have a good apitie for a while but has finally got this back. He has gain a few lbs over the part month since getting his apitie back. His wife is ooking healthy options. Ron is a sweets lovers and does enjoy a nutty buddy or 2 a day. He is drinking mostly water,  does not drink a lot of soads and only will have tea in a resturant    Expected Outcome Short: cut back on nutty buddies each day   long : continue to drnk water and pick healthy options          Psychosocial: Target Goals: Acknowledge presence or absence of significant depression and/or stress, maximize coping skills, provide positive support system. Participant is able to verbalize types and ability to use techniques and skills needed for reducing stress and depression.  Initial Review & Psychosocial Screening:  Initial Psych Review & Screening -  08/17/23 1008       Initial Review   Current issues with Current Sleep Concerns;Current Psychotropic Meds      Family Dynamics   Good Support System? Yes      Barriers   Psychosocial barriers to participate in program There are no identifiable barriers or psychosocial needs.      Screening Interventions   Interventions Encouraged to exercise;To provide support and resources with identified psychosocial needs;Provide feedback about the scores to participant    Expected Outcomes Short Term goal: Utilizing psychosocial counselor, staff and physician to assist with identification of specific Stressors or current issues interfering with healing process. Setting desired goal for each stressor or current issue identified.;Long Term Goal: Stressors or current issues are controlled or eliminated.;Short Term goal: Identification and review with participant of any Quality of Life or Depression concerns found by scoring the questionnaire.;Long Term goal: The participant improves quality of Life and PHQ9 Scores as seen by post scores and/or verbalization of changes          Quality of Life Scores:  Scores of 19 and below usually indicate a poorer quality of life in these areas.  A difference of  2-3 points is a clinically meaningful difference.  A difference of 2-3 points in the total score of the Quality of Life Index has been associated with significant improvement in overall quality of life, self-image, physical symptoms, and general health in studies assessing change in quality of life.   PHQ-9: Review Flowsheet  More data exists      08/17/2023 04/21/2023 01/28/2023 10/21/2022 08/04/2022  Depression screen PHQ 2/9  Decreased Interest 0 0 0 0 0  Down, Depressed, Hopeless 0 0 0 0 0  PHQ - 2 Score 0 0 0 0 0  Altered sleeping 3 0 - - -  Tired, decreased energy 1 0 - - -  Change in appetite 2 0 - - -  Feeling bad or failure about yourself  0 0 - - -  Trouble concentrating 0 0 - - -  Moving  slowly or fidgety/restless 0 0 - - -  Suicidal thoughts 0 0 - - -  PHQ-9 Score 6 0 - - -  Difficult doing work/chores Somewhat difficult Not difficult at all - - -   Interpretation of Total Score  Total Score Depression Severity:  1-4 = Minimal depression, 5-9 = Mild depression, 10-14 = Moderate depression, 15-19 = Moderately severe depression, 20-27 = Severe depression   Psychosocial Evaluation and Intervention:  Psychosocial Evaluation - 08/17/23 1009       Psychosocial Evaluation & Interventions   Interventions Stress management education;Relaxation education;Encouraged to exercise with the program and follow exercise prescription    Comments Patient was referred to PR with chronic systolic HF from [facility]. His initial PHQ-9 score was 6 due to sleep issues and lack of energy. He says he is taking Lexapro  for anxiety which  works very well. He has difficulty falling and staying asleep. He has been diagnosed with OSA but refuses to wear a CPAP. He says he has to get up frequently at nigth with nocturia and feels a CPAP would not help this. He denies any depression of stressors. He lives with his wife who is his main support along with his sister and neices and nephews. His goals for the program are to be able to do all the activities he wants to do; improve his breathing and energy.    Expected Outcomes Short Term: Patient will start the program and attend consistently. Long Term: Patient will complete the program meeting personal goals.    Continue Psychosocial Services  No Follow up required          Psychosocial Re-Evaluation:  Psychosocial Re-Evaluation     Row Name 10/01/23 1307             Psychosocial Re-Evaluation   Current issues with Current Sleep Concerns       Comments Ron is doing well in rehab. He stated that he does have some trouble sleeping. He does have an elevated bed that sits hip up enough with his pillow that he is able to sleep well. He has a sleep study done  about 10 years ago and they wanted him to use a cpap but he did not want to. He does get up some during the night to use thye restroom but that is it.       Expected Outcomes Short: montior sleep if it gets worse contact MD    long: continue to have no stressors       Interventions Encouraged to attend Pulmonary Rehabilitation for the exercise       Continue Psychosocial Services  Follow up required by staff          Psychosocial Discharge (Final Psychosocial Re-Evaluation):  Psychosocial Re-Evaluation - 10/01/23 1307       Psychosocial Re-Evaluation   Current issues with Current Sleep Concerns    Comments Ron is doing well in rehab. He stated that he does have some trouble sleeping. He does have an elevated bed that sits hip up enough with his pillow that he is able to sleep well. He has a sleep study done about 10 years ago and they wanted him to use a cpap but he did not want to. He does get up some during the night to use thye restroom but that is it.    Expected Outcomes Short: montior sleep if it gets worse contact MD    long: continue to have no stressors    Interventions Encouraged to attend Pulmonary Rehabilitation for the exercise    Continue Psychosocial Services  Follow up required by staff           Education: Education Goals: Education classes will be provided on a weekly basis, covering required topics. Participant will state understanding/return demonstration of topics presented.  Learning Barriers/Preferences:  Learning Barriers/Preferences - 08/17/23 9166       Learning Barriers/Preferences   Learning Barriers None    Learning Preferences Skilled Demonstration          Education Topics: How Lungs Work and Diseases: - Discuss the anatomy of the lungs and diseases that can affect the lungs, such as COPD. Flowsheet Row PULMONARY REHAB OTHER RESPIRATORY from 10/01/2023 in [facility]  Date 10/01/23  Educator jh  Instruction Review Code 1-  Verbalizes Understanding    Exercise: -Discuss the  importance of exercise, FITT principles of exercise, normal and abnormal responses to exercise, and how to exercise safely.   Environmental Irritants: -Discuss types of environmental irritants and how to limit exposure to environmental irritants.   Meds/Inhalers and oxygen: - Discuss respiratory medications, definition of an inhaler and oxygen, and the proper way to use an inhaler and oxygen.   Energy Saving Techniques: - Discuss methods to conserve energy and decrease shortness of breath when performing activities of daily living.  Flowsheet Row PULMONARY REHAB OTHER RESPIRATORY from 10/01/2023 in [facility]  Date 08/20/23  Educator HB  Instruction Review Code 1- Verbalizes Understanding    Bronchial Hygiene / Breathing Techniques: - Discuss breathing mechanics, pursed-lip breathing technique,  proper posture, effective ways to clear airways, and other functional breathing techniques   Cleaning Equipment: - Provides group verbal and written instruction about the health risks of elevated stress, cause of high stress, and healthy ways to reduce stress.   Nutrition I: Fats: - Discuss the types of cholesterol, what cholesterol does to the body, and how cholesterol levels can be controlled.   Nutrition II: Labels: -Discuss the different components of food labels and how to read food labels.   Respiratory Infections: - Discuss the signs and symptoms of respiratory infections, ways to prevent respiratory infections, and the importance of seeking medical treatment when having a respiratory infection.   Stress I: Signs and Symptoms: - Discuss the causes of stress, how stress may lead to anxiety and depression, and ways to limit stress. Flowsheet Row PULMONARY REHAB OTHER RESPIRATORY from 10/01/2023 in [facility]  Date 09/10/23  Educator [facility]  Instruction Review Code 1- Verbalizes  Understanding    Stress II: Relaxation: -Discuss relaxation techniques to limit stress.   Oxygen for Home/Travel: - Discuss how to prepare for travel when on oxygen and proper ways to transport and store oxygen to ensure safety.   Knowledge Questionnaire Score:  Knowledge Questionnaire Score - 08/17/23 1006       Knowledge Questionnaire Score   Pre Score 9/18          Core Components/Risk Factors/Patient Goals at Admission:  Personal Goals and Risk Factors at Admission - 08/17/23 1004       Core Components/Risk Factors/Patient Goals on Admission    Weight Management Weight Maintenance    Improve shortness of breath with ADL's Yes    Intervention Provide education, individualized exercise plan and daily activity instruction to help decrease symptoms of SOB with activities of daily living.    Expected Outcomes Short Term: Improve cardiorespiratory fitness to achieve a reduction of symptoms when performing ADLs;Long Term: Be able to perform more ADLs without symptoms or delay the onset of symptoms    Heart Failure Yes    Intervention Provide a combined exercise and nutrition program that is supplemented with education, support and counseling about heart failure. Directed toward relieving symptoms such as shortness of breath, decreased exercise tolerance, and extremity edema.    Expected Outcomes Improve functional capacity of life;Short term: Daily weights obtained and reported for increase. Utilizing diuretic protocols set by physician.;Long term: Adoption of self-care skills and reduction of barriers for early signs and symptoms recognition and intervention leading to self-care maintenance.;Short term: Attendance in program 2-3 days a week with increased exercise capacity. Reported lower sodium intake. Reported increased fruit and vegetable intake. Reports medication compliance.    Hypertension Yes    Intervention Provide education on lifestyle modifcations including regular  physical activity/exercise, weight  management, moderate sodium restriction and increased consumption of fresh fruit, vegetables, and low fat dairy, alcohol moderation, and smoking cessation.;Monitor prescription use compliance.    Expected Outcomes Short Term: Continued assessment and intervention until BP is < 140/61mm HG in hypertensive participants. < 130/1mm HG in hypertensive participants with diabetes, heart failure or chronic kidney disease.;Long Term: Maintenance of blood pressure at goal levels.    Lipids Yes    Intervention Provide education and support for participant on nutrition & aerobic/resistive exercise along with prescribed medications to achieve LDL 70mg , HDL >40mg .    Expected Outcomes Short Term: Participant states understanding of desired cholesterol values and is compliant with medications prescribed. Participant is following exercise prescription and nutrition guidelines.;Long Term: Cholesterol controlled with medications as prescribed, with individualized exercise RX and with personalized nutrition plan. Value goals: LDL < 70mg , HDL > 40 mg.          Core Components/Risk Factors/Patient Goals Review:   Goals and Risk Factor Review     Row Name 10/01/23 1352             Core Components/Risk Factors/Patient Goals Review   Personal Goals Review Weight Management/Obesity;Improve shortness of breath with ADL's       Review Ron is doing well in rehab. He has gain a little weight the past month due to gaining his apitite back. He has no stess and has been working on his sleep.       Expected Outcomes Short: monitor food and weight for managment  long: keep exercise for overall happienss          Core Components/Risk Factors/Patient Goals at Discharge (Final Review):   Goals and Risk Factor Review - 10/01/23 1352       Core Components/Risk Factors/Patient Goals Review   Personal Goals Review Weight Management/Obesity;Improve shortness of breath with ADL's    Review  Ron is doing well in rehab. He has gain a little weight the past month due to gaining his apitite back. He has no stess and has been working on his sleep.    Expected Outcomes Short: monitor food and weight for managment  long: keep exercise for overall happienss          ITP Comments:  ITP Comments     Row Name 08/18/23 1033 09/09/23 0920 10/07/23 1147       ITP Comments First full day of exercise!  Patient was oriented to gym and equipment including functions, settings, policies, and procedures.  Patient's individual exercise prescription and treatment plan were reviewed.  All starting workloads were established based on the results of the 6 minute walk test done at initial orientation visit.  The plan for exercise progression was also introduced and progression will be customized based on patient's performance and goals. 30 day review completed. ITP sent to Dr.Jehanzeb Memon, Medical Director of  Pulmonary Rehab. Continue with ITP unless changes are made by physician.  New to program 30 day review completed. ITP sent to Dr.Jehanzeb Memon, Medical Director of  Pulmonary Rehab. Continue with ITP unless changes are made by physician.        Comments: 30 day review

## 2023-10-08 ENCOUNTER — Encounter (HOSPITAL_COMMUNITY)
Admission: RE | Admit: 2023-10-08 | Discharge: 2023-10-08 | Disposition: A | Discharge status: Home / Self Care | Source: Ambulatory Visit

## 2023-10-08 DIAGNOSIS — I5022 Chronic systolic (congestive) heart failure: Secondary | ICD-10-CM | POA: Diagnosis not present

## 2023-10-08 NOTE — Progress Notes (Signed)
 Daily Session Note  Patient Details  Name: Terry Pacheco MRN: [id number] Date of Birth: March 31, 1950 Referring Provider:   Flowsheet Row PULMONARY REHAB OTHER RESP ORIENTATION from 08/17/2023 in [facility]  Referring Provider Freddy Blinks MD    Encounter Date: 10/08/2023  Check In:  Session Check In - 10/08/23 1030       Check-In   Supervising physician immediately available to respond to emergencies See telemetry face sheet for immediately available MD    Location [facility]    Staff Present Powell Benders, BS, Exercise Physiologist;Other    Virtual Visit No    Medication changes reported     No    Fall or balance concerns reported    No    Tobacco Cessation No Change    Warm-up and Cool-down Performed on first and last piece of equipment    Resistance Training Performed Yes    VAD Patient? No    PAD/SET Patient? No      Pain Assessment   Currently in Pain? No/denies    Multiple Pain Sites No          Capillary Blood Glucose: No results found for this or any previous visit (from the past 24 hours).    Social History   Tobacco Use  Smoking Status Former   Current packs/day: 0.00   Average packs/day: 0.5 packs/day for 45.0 years (22.5 ttl pk-yrs)   Types: Cigarettes   Start date: 01/12/1983   Quit date: 11/17/2016   Years since quitting: 6.8  Smokeless Tobacco Never    Goals Met:  Independence with exercise equipment Using PLB without cueing & demonstrates good technique Exercise tolerated well No report of concerns or symptoms today Strength training completed today  Goals Unmet:  Not Applicable  Comments: Pt able to follow exercise prescription today without complaint.  Will continue to monitor for progression.

## 2023-10-12 DIAGNOSIS — C44612 Basal cell carcinoma of skin of right upper limb, including shoulder: Secondary | ICD-10-CM | POA: Diagnosis not present

## 2023-10-12 DIAGNOSIS — L905 Scar conditions and fibrosis of skin: Secondary | ICD-10-CM | POA: Diagnosis not present

## 2023-10-13 ENCOUNTER — Encounter (HOSPITAL_COMMUNITY): Admission: RE | Admit: 2023-10-13 | Source: Ambulatory Visit

## 2023-10-14 DIAGNOSIS — J849 Interstitial pulmonary disease, unspecified: Secondary | ICD-10-CM | POA: Diagnosis not present

## 2023-10-14 DIAGNOSIS — J439 Emphysema, unspecified: Secondary | ICD-10-CM | POA: Diagnosis not present

## 2023-10-14 DIAGNOSIS — I700 Atherosclerosis of aorta: Secondary | ICD-10-CM | POA: Diagnosis not present

## 2023-10-14 DIAGNOSIS — I2510 Atherosclerotic heart disease of native coronary artery without angina pectoris: Secondary | ICD-10-CM | POA: Diagnosis not present

## 2023-10-14 DIAGNOSIS — I350 Nonrheumatic aortic (valve) stenosis: Secondary | ICD-10-CM | POA: Diagnosis not present

## 2023-10-14 DIAGNOSIS — R0602 Shortness of breath: Secondary | ICD-10-CM | POA: Diagnosis not present

## 2023-10-14 DIAGNOSIS — Z01818 Encounter for other preprocedural examination: Secondary | ICD-10-CM | POA: Diagnosis not present

## 2023-10-14 DIAGNOSIS — K5730 Diverticulosis of large intestine without perforation or abscess without bleeding: Secondary | ICD-10-CM | POA: Diagnosis not present

## 2023-10-14 DIAGNOSIS — I701 Atherosclerosis of renal artery: Secondary | ICD-10-CM | POA: Diagnosis not present

## 2023-10-14 DIAGNOSIS — C090 Malignant neoplasm of tonsillar fossa: Secondary | ICD-10-CM | POA: Diagnosis not present

## 2023-10-15 ENCOUNTER — Other Ambulatory Visit: Payer: Self-pay | Admitting: Family Medicine

## 2023-10-15 ENCOUNTER — Encounter (HOSPITAL_COMMUNITY)
Admission: RE | Admit: 2023-10-15 | Discharge: 2023-10-15 | Disposition: A | Discharge status: Home / Self Care | Source: Ambulatory Visit

## 2023-10-15 DIAGNOSIS — I5022 Chronic systolic (congestive) heart failure: Secondary | ICD-10-CM

## 2023-10-15 DIAGNOSIS — I10 Essential (primary) hypertension: Secondary | ICD-10-CM

## 2023-10-15 DIAGNOSIS — K219 Gastro-esophageal reflux disease without esophagitis: Secondary | ICD-10-CM

## 2023-10-15 DIAGNOSIS — E785 Hyperlipidemia, unspecified: Secondary | ICD-10-CM

## 2023-10-15 DIAGNOSIS — N401 Enlarged prostate with lower urinary tract symptoms: Secondary | ICD-10-CM

## 2023-10-15 NOTE — Progress Notes (Signed)
 Daily Session Note  Patient Details  Name: Terry Pacheco MRN: [id number] Date of Birth: 10-Jun-1950 Referring Provider:   Flowsheet Row PULMONARY REHAB OTHER RESP ORIENTATION from 08/17/2023 in [facility]  Referring Provider Freddy Blinks MD    Encounter Date: 10/15/2023  Check In:  Session Check In - 10/15/23 1015       Check-In   Supervising physician immediately available to respond to emergencies See telemetry face sheet for immediately available MD    Location [facility]    Staff Present Adrien Louder, RN, BSN;Jessica Vonzell, MA, RCEP, CCRP, CCET;Heather Con, [place], Exercise Physiologist;Victoria Zina, RN    Virtual Visit No    Medication changes reported     No    Fall or balance concerns reported    No    Warm-up and Cool-down Performed on first and last piece of equipment    Resistance Training Performed Yes    VAD Patient? No    PAD/SET Patient? No      Pain Assessment   Currently in Pain? No/denies    Multiple Pain Sites No          Capillary Blood Glucose: No results found for this or any previous visit (from the past 24 hours).    Social History   Tobacco Use  Smoking Status Former   Current packs/day: 0.00   Average packs/day: 0.5 packs/day for 45.0 years (22.5 ttl pk-yrs)   Types: Cigarettes   Start date: 01/12/1983   Quit date: 11/17/2016   Years since quitting: 6.9  Smokeless Tobacco Never    Goals Met:  Independence with exercise equipment Exercise tolerated well No report of concerns or symptoms today Strength training completed today  Goals Unmet:  Not Applicable  Comments: Pt able to follow exercise prescription today without complaint.  Will continue to monitor for progression.SABRA

## 2023-10-20 ENCOUNTER — Encounter: Payer: HMO | Admitting: Family Medicine

## 2023-10-20 ENCOUNTER — Encounter (HOSPITAL_COMMUNITY)

## 2023-10-20 DIAGNOSIS — H35033 Hypertensive retinopathy, bilateral: Secondary | ICD-10-CM | POA: Diagnosis not present

## 2023-10-20 DIAGNOSIS — H35371 Puckering of macula, right eye: Secondary | ICD-10-CM | POA: Diagnosis not present

## 2023-10-20 DIAGNOSIS — H524 Presbyopia: Secondary | ICD-10-CM | POA: Diagnosis not present

## 2023-10-20 DIAGNOSIS — H04123 Dry eye syndrome of bilateral lacrimal glands: Secondary | ICD-10-CM | POA: Diagnosis not present

## 2023-10-20 DIAGNOSIS — H26493 Other secondary cataract, bilateral: Secondary | ICD-10-CM | POA: Diagnosis not present

## 2023-10-22 ENCOUNTER — Encounter (HOSPITAL_COMMUNITY)

## 2023-10-22 DIAGNOSIS — R9431 Abnormal electrocardiogram [ECG] [EKG]: Secondary | ICD-10-CM | POA: Diagnosis not present

## 2023-10-22 DIAGNOSIS — I350 Nonrheumatic aortic (valve) stenosis: Secondary | ICD-10-CM | POA: Diagnosis not present

## 2023-10-22 DIAGNOSIS — Z952 Presence of prosthetic heart valve: Secondary | ICD-10-CM | POA: Diagnosis not present

## 2023-10-22 DIAGNOSIS — Z9889 Other specified postprocedural states: Secondary | ICD-10-CM | POA: Diagnosis not present

## 2023-10-22 DIAGNOSIS — E875 Hyperkalemia: Secondary | ICD-10-CM | POA: Diagnosis not present

## 2023-10-22 DIAGNOSIS — J439 Emphysema, unspecified: Secondary | ICD-10-CM | POA: Diagnosis not present

## 2023-10-22 DIAGNOSIS — Z953 Presence of xenogenic heart valve: Secondary | ICD-10-CM | POA: Diagnosis not present

## 2023-10-22 DIAGNOSIS — Z006 Encounter for examination for normal comparison and control in clinical research program: Secondary | ICD-10-CM | POA: Diagnosis not present

## 2023-10-22 DIAGNOSIS — R001 Bradycardia, unspecified: Secondary | ICD-10-CM | POA: Diagnosis not present

## 2023-10-23 DIAGNOSIS — I2510 Atherosclerotic heart disease of native coronary artery without angina pectoris: Secondary | ICD-10-CM | POA: Diagnosis not present

## 2023-10-23 DIAGNOSIS — J42 Unspecified chronic bronchitis: Secondary | ICD-10-CM | POA: Diagnosis not present

## 2023-10-23 DIAGNOSIS — Z952 Presence of prosthetic heart valve: Secondary | ICD-10-CM | POA: Diagnosis not present

## 2023-10-23 DIAGNOSIS — C099 Malignant neoplasm of tonsil, unspecified: Secondary | ICD-10-CM | POA: Diagnosis not present

## 2023-10-23 DIAGNOSIS — I350 Nonrheumatic aortic (valve) stenosis: Secondary | ICD-10-CM | POA: Diagnosis not present

## 2023-10-23 DIAGNOSIS — K219 Gastro-esophageal reflux disease without esophagitis: Secondary | ICD-10-CM | POA: Diagnosis not present

## 2023-10-23 DIAGNOSIS — F411 Generalized anxiety disorder: Secondary | ICD-10-CM | POA: Diagnosis not present

## 2023-10-23 DIAGNOSIS — N401 Enlarged prostate with lower urinary tract symptoms: Secondary | ICD-10-CM | POA: Diagnosis not present

## 2023-10-23 DIAGNOSIS — E785 Hyperlipidemia, unspecified: Secondary | ICD-10-CM | POA: Diagnosis not present

## 2023-10-23 DIAGNOSIS — I509 Heart failure, unspecified: Secondary | ICD-10-CM | POA: Diagnosis not present

## 2023-10-23 DIAGNOSIS — I10 Essential (primary) hypertension: Secondary | ICD-10-CM | POA: Diagnosis not present

## 2023-10-23 DIAGNOSIS — Q676 Pectus excavatum: Secondary | ICD-10-CM | POA: Diagnosis not present

## 2023-10-23 DIAGNOSIS — E039 Hypothyroidism, unspecified: Secondary | ICD-10-CM | POA: Diagnosis not present

## 2023-10-27 ENCOUNTER — Encounter (HOSPITAL_COMMUNITY)

## 2023-10-29 ENCOUNTER — Encounter (HOSPITAL_COMMUNITY)

## 2023-11-02 NOTE — Progress Notes (Signed)
 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   Smoking History:  Social History   Tobacco Use  Smoking Status Former   Current packs/day: 0.00   Average packs/day: 0.5 packs/day for 45.0 years (22.5 ttl pk-yrs)   Types: Cigarettes   Start date: 01/12/1983   Quit date: 11/17/2016   Years since quitting: 6.9  Smokeless Tobacco Never    Diagnosis:  Heart failure, chronic systolic (HCC)  ADL [facility]SD:  Pulmonary Assessment Scores     Row Name 08/17/23 1008         ADL UCSD   ADL Phase Entry     SOB Score total 31     Rest 0     Walk 1     Stairs 3     Bath 2     Dress 1     Shop 2       CAT Score   CAT Score 15       mMRC Score   mMRC Score 2        Initial Exercise Prescription:  Initial Exercise Prescription - 08/17/23 0900       Date of Initial Exercise RX and Referring Provider   Date 08/17/23    Referring Provider Freddy Blinks MD      Treadmill   MPH 1.5    Grade 0    Minutes 15    METs 2.23      NuStep   Level 2    SPM 50    Minutes 15    METs 1.9      Prescription Details   Frequency (times per week) 2    Duration Progress to 30 minutes of continuous aerobic without signs/symptoms of physical distress      Intensity   THRR 40-80% of Max Heartrate 108-134    Ratings of Perceived Exertion 11-13    Perceived Dyspnea 0-4      Progression   Progression Continue to progress workloads to maintain intensity without signs/symptoms of physical distress.      Resistance Training   Training Prescription Yes    Weight 4    Reps 10-15          Discharge Exercise Prescription (Final Exercise  Prescription Changes):  Exercise Prescription Changes - 10/21/23 1300       Response to Exercise   Blood Pressure (Admit) 108/60    Blood Pressure (Exit) 128/72    Heart Rate (Admit) 68 bpm    Heart Rate (Exercise) 84 bpm    Heart Rate (Exit) 78 bpm    Oxygen Saturation (Admit) 94 %    Oxygen Saturation (Exercise) 92 %    Oxygen Saturation (Exit) 94 %    Rating of Perceived Exertion (Exercise) 11    Perceived Dyspnea (Exercise) 1    Duration Continue with 30 min of aerobic exercise without signs/symptoms of physical distress.    Intensity THRR unchanged      Progression   Progression Continue to progress workloads to maintain intensity without signs/symptoms of physical distress.      Resistance Training   Training Prescription Yes    Weight 4    Reps  10-15      Treadmill   MPH 2.7    Grade 0    Minutes 15    METs 3.07      NuStep   Level 5    SPM 94    Minutes 15    METs 3          Functional Capacity:  6 Minute Walk     Row Name 08/17/23 0939         6 Minute Walk   Phase Initial     Distance 1190 feet     Walk Time 6 minutes     # of Rest Breaks 0     MPH 2.25     METS 2.28     RPE 12     Perceived Dyspnea  1     VO2 Peak 7.96     Symptoms No     Resting HR 81 bpm     Resting BP 90/60     Resting Oxygen Saturation  92 %     Exercise Oxygen Saturation  during 6 min walk 88 %     Max Ex. HR 92 bpm     Max Ex. BP 106/62     2 Minute Post BP 98/60       Interval HR   1 Minute HR 88     2 Minute HR 99     3 Minute HR 98     4 Minute HR 92     5 Minute HR 92     6 Minute HR 92     2 Minute Post HR 83     Interval Heart Rate? Yes       Interval Oxygen   Interval Oxygen? Yes     Baseline Oxygen Saturation % 92 %     1 Minute Oxygen Saturation % 92 %     1 Minute Liters of Oxygen 0 L     2 Minute Oxygen Saturation % 90 %     2 Minute Liters of Oxygen 0 L     3 Minute Oxygen Saturation % 89 %     3 Minute Liters of Oxygen 0 L     4  Minute Oxygen Saturation % 88 %     4 Minute Liters of Oxygen 0 L     5 Minute Oxygen Saturation % 90 %     5 Minute Liters of Oxygen 0 L     6 Minute Oxygen Saturation % 89 %     6 Minute Liters of Oxygen 0 L     2 Minute Post Oxygen Saturation % 93 %     2 Minute Post Liters of Oxygen 0 L        Psychological, QOL, Others - Outcomes: PHQ 2/9:    08/17/2023    9:20 AM 04/21/2023    9:11 AM 01/28/2023    2:55 PM 10/21/2022    8:19 AM 08/04/2022   11:23 AM  Depression screen PHQ 2/9  Decreased Interest 0 0 0 0 0  Down, Depressed, Hopeless 0 0 0 0 0  PHQ - 2 Score 0 0 0 0 0  Altered sleeping 3 0     Tired, decreased energy 1 0     Change in appetite 2 0     Feeling bad or failure about yourself  0 0     Trouble concentrating 0 0     Moving slowly or fidgety/restless 0 0  Suicidal thoughts 0 0     PHQ-9 Score 6 0     Difficult doing work/chores Somewhat difficult Not difficult at all       Quality of Life:   Personal Goals: Goals established at orientation with interventions provided to work toward goal.  Personal Goals and Risk Factors at Admission - 08/17/23 1004       Core Components/Risk Factors/Patient Goals on Admission    Weight Management Weight Maintenance    Improve shortness of breath with ADL's Yes    Intervention Provide education, individualized exercise plan and daily activity instruction to help decrease symptoms of SOB with activities of daily living.    Expected Outcomes Short Term: Improve cardiorespiratory fitness to achieve a reduction of symptoms when performing ADLs;Long Term: Be able to perform more ADLs without symptoms or delay the onset of symptoms    Heart Failure Yes    Intervention Provide a combined exercise and nutrition program that is supplemented with education, support and counseling about heart failure. Directed toward relieving symptoms such as shortness of breath, decreased exercise tolerance, and extremity edema.    Expected  Outcomes Improve functional capacity of life;Short term: Daily weights obtained and reported for increase. Utilizing diuretic protocols set by physician.;Long term: Adoption of self-care skills and reduction of barriers for early signs and symptoms recognition and intervention leading to self-care maintenance.;Short term: Attendance in program 2-3 days a week with increased exercise capacity. Reported lower sodium intake. Reported increased fruit and vegetable intake. Reports medication compliance.    Hypertension Yes    Intervention Provide education on lifestyle modifcations including regular physical activity/exercise, weight management, moderate sodium restriction and increased consumption of fresh fruit, vegetables, and low fat dairy, alcohol moderation, and smoking cessation.;Monitor prescription use compliance.    Expected Outcomes Short Term: Continued assessment and intervention until BP is < 140/60mm HG in hypertensive participants. < 130/59mm HG in hypertensive participants with diabetes, heart failure or chronic kidney disease.;Long Term: Maintenance of blood pressure at goal levels.    Lipids Yes    Intervention Provide education and support for participant on nutrition & aerobic/resistive exercise along with prescribed medications to achieve LDL 70mg , HDL >40mg .    Expected Outcomes Short Term: Participant states understanding of desired cholesterol values and is compliant with medications prescribed. Participant is following exercise prescription and nutrition guidelines.;Long Term: Cholesterol controlled with medications as prescribed, with individualized exercise RX and with personalized nutrition plan. Value goals: LDL < 70mg , HDL > 40 mg.           Personal Goals Discharge:  Goals and Risk Factor Review     Row Name 10/01/23 1352             Core Components/Risk Factors/Patient Goals Review   Personal Goals Review Weight Management/Obesity;Improve shortness of breath with  ADL's       Review Terry Pacheco is doing well in rehab. He has gain a little weight the past month due to gaining his apitite back. He has no stess and has been working on his sleep.       Expected Outcomes Short: monitor food and weight for managment  long: keep exercise for overall happienss          Exercise Goals and Review:  Exercise Goals     Row Name 08/17/23 0942             Exercise Goals   Increase Physical Activity Yes       Intervention Provide advice, education,  support and counseling about physical activity/exercise needs.;Develop an individualized exercise prescription for aerobic and resistive training based on initial evaluation findings, risk stratification, comorbidities and participant's personal goals.       Expected Outcomes Long Term: Add in home exercise to make exercise part of routine and to increase amount of physical activity.;Short Term: Attend rehab on a regular basis to increase amount of physical activity.;Long Term: Exercising regularly at least 3-5 days a week.       Increase Strength and Stamina Yes       Intervention Provide advice, education, support and counseling about physical activity/exercise needs.;Develop an individualized exercise prescription for aerobic and resistive training based on initial evaluation findings, risk stratification, comorbidities and participant's personal goals.       Expected Outcomes Short Term: Increase workloads from initial exercise prescription for resistance, speed, and METs.;Short Term: Perform resistance training exercises routinely during rehab and add in resistance training at home;Long Term: Improve cardiorespiratory fitness, muscular endurance and strength as measured by increased METs and functional capacity ( )       Able to understand and use rate of perceived exertion (RPE) scale Yes       Intervention Provide education and explanation on how to use RPE scale       Expected Outcomes Short Term: Able to use RPE daily  in rehab to express subjective intensity level;Long Term:  Able to use RPE to guide intensity level when exercising independently       Able to understand and use Dyspnea scale Yes       Intervention Provide education and explanation on how to use Dyspnea scale       Expected Outcomes Short Term: Able to use Dyspnea scale daily in rehab to express subjective sense of shortness of breath during exertion;Long Term: Able to use Dyspnea scale to guide intensity level when exercising independently       Knowledge and understanding of Target Heart Rate Range (THRR) Yes       Intervention Provide education and explanation of THRR including how the numbers were predicted and where they are located for reference       Expected Outcomes Short Term: Able to state/look up THRR;Long Term: Able to use THRR to govern intensity when exercising independently;Short Term: Able to use daily as guideline for intensity in rehab       Able to check pulse independently Yes       Intervention Provide education and demonstration on how to check pulse in carotid and radial arteries.;Review the importance of being able to check your own pulse for safety during independent exercise       Expected Outcomes Short Term: Able to explain why pulse checking is important during independent exercise;Long Term: Able to check pulse independently and accurately       Understanding of Exercise Prescription Yes       Intervention Provide education, explanation, and written materials on patient's individual exercise prescription       Expected Outcomes Short Term: Able to explain program exercise prescription;Long Term: Able to explain home exercise prescription to exercise independently          Exercise Goals Re-Evaluation:  Exercise Goals Re-Evaluation     Row Name 08/18/23 1033 10/01/23 1303           Exercise Goal Re-Evaluation   Exercise Goals Review Knowledge and understanding of Target Heart Rate Range (THRR);Able to  understand and use rate of perceived exertion (RPE) scale Increase Physical Activity;Increase  Strength and Stamina;Understanding of Exercise Prescription      Comments Reviewed RPE and dyspnea scale, THR and program prescription with pt today.  Pt voiced understanding and was given a copy of goals to take home. Terry Pacheco is doing well in rehab. He is feeling good with exercise and is feeling an increase in his endurance. He is walking some at home due to heat and weather. He has 4lbs hand weights and resistance bands at home that he has been using. He is increasing his levels in rehab by walking speed on the treadmill and increasing level on the nustep. Will continue to monitor and progress as able.      Expected Outcomes Short: Use RPE daily to regulate intensity.  Long: Follow program prescription in THR. Short: increase hand weight lbs if able in the next week or two   long: continue to exercise at hoem and attend rehab         Nutrition & Weight - Outcomes:  Pre Biometrics - 08/17/23 0943       Pre Biometrics   Height 5' 5.5 (1.664 m)    Weight 75.8 kg    Waist Circumference 36 inches    Hip Circumference 37.5 inches    Waist to Hip Ratio 0.96 %    BMI (Calculated) 27.38    Grip Strength 17.4 kg           Nutrition:   Nutrition Discharge:   Education Questionnaire Score:  Knowledge Questionnaire Score - 08/17/23 1006       Knowledge Questionnaire Score   Pre Score 9/18          Patient had TAVR and has new referral for cardiac rehab.

## 2023-11-02 NOTE — Progress Notes (Signed)
 Pulmonary Individual Treatment Plan  Patient Details  Name: Terry Pacheco MRN: [id number] Date of Birth: Jan 30, 1951 Referring Provider:   Flowsheet Row PULMONARY REHAB OTHER RESP ORIENTATION from 08/17/2023 in [facility] CARDIAC REHABILITATION  Referring Provider Freddy Blinks MD    Initial Encounter Date:  Flowsheet Row PULMONARY REHAB OTHER RESP ORIENTATION from 08/17/2023 in [facility] CARDIAC REHABILITATION  Date 08/17/23    Visit Diagnosis: Heart failure, chronic systolic (HCC)  Patient's Home Medications on Admission:   Current Outpatient Medications:    acetaminophen  (TYLENOL ) 500 MG tablet, Take 1,000 mg by mouth daily as needed for moderate pain or headache., Disp: , Rfl:    albuterol  (ACCUNEB ) 1.25 MG/3ML nebulizer solution, Take 3 mLs (1.25 mg total) by nebulization every 6 (six) hours as needed for wheezing., Disp: 75 mL, Rfl: 12   amLODipine  (NORVASC ) 10 MG tablet, TAKE 1 TABLET EVERY DAY FOR FOR BLOOD PRESSURE, Disp: 90 tablet, Rfl: 3   amoxicillin -clavulanate (AUGMENTIN ) 875-125 MG tablet, Take 1 tablet by mouth 2 (two) times daily. Take all of this medication (Patient not taking: Reported on 08/17/2023), Disp: 20 tablet, Rfl: 0   aspirin  EC 81 MG tablet, Take 81 mg by mouth daily., Disp: , Rfl:    atorvastatin  (LIPITOR) 40 MG tablet, Take 1 tablet (40 mg total) by mouth daily., Disp: 90 tablet, Rfl: 3   B Complex CAPS, TAKE 1 CAPSULE BY MOUTH EVERY DAY, Disp: 100 capsule, Rfl: 2   calcium  carbonate (TUMS - DOSED IN MG ELEMENTAL CALCIUM ) 500 MG chewable tablet, Chew 2 tablets by mouth daily as needed for indigestion or heartburn., Disp: , Rfl:    empagliflozin (JARDIANCE) 10 MG TABS tablet, Take 1 tablet by mouth daily., Disp: , Rfl:    escitalopram  (LEXAPRO ) 20 MG tablet, TAKE 1 TABLET BY MOUTH EVERY DAY, Disp: 90 tablet, Rfl: 0   ezetimibe  (ZETIA ) 10 MG tablet, Take 1 tablet (10 mg total) by mouth daily., Disp: 90 tablet, Rfl: 0   fexofenadine -pseudoephedrine  (ALLEGRA-D  24) 180-240 MG 24 hr tablet, Take 1 tablet by mouth every evening. For allergy and congestion (Patient not taking: Reported on 08/17/2023), Disp: 30 tablet, Rfl: 11   finasteride  (PROSCAR ) 5 MG tablet, Take 1 tablet (5 mg total) by mouth daily. For urine flow (Patient not taking: Reported on 08/17/2023), Disp: 90 tablet, Rfl: 3   fluticasone  furoate-vilanterol (BREO ELLIPTA ) 200-25 MCG/ACT AEPB, Inhale 1 puff into the lungs daily., Disp: 1 each, Rfl: 11   Glucosamine 500 MG CAPS, Take by mouth., Disp: , Rfl:    moxifloxacin  (AVELOX ) 400 MG tablet, Take 1 tablet (400 mg total) by mouth daily. (Patient not taking: Reported on 08/17/2023), Disp: 10 tablet, Rfl: 0   omeprazole  (PRILOSEC) 20 MG capsule, TAKE 1 CAPSULE BY MOUTH EVERY DAY, Disp: 90 capsule, Rfl: 3   spironolactone (ALDACTONE) 25 MG tablet, Take 12.5 mg by mouth daily., Disp: , Rfl:    tamsulosin  (FLOMAX ) 0.4 MG CAPS capsule, Take 2 capsules (0.8 mg total) by mouth at bedtime. 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    Hypertension    Sleep apnea    does not use CPAP   Wears glasses     Tobacco Use: Social History   Tobacco Use  Smoking Status Former   Current packs/day: 0.00    Average packs/day: 0.5 packs/day for 45.0 years (22.5 ttl pk-yrs)   Types: Cigarettes   Start date: 01/12/1983   Quit date: 11/17/2016   Years since quitting: 6.9  Smokeless Tobacco Never    Labs: Review Flowsheet  More data exists      Latest Ref Rng & Units 04/18/2021 10/21/2021 04/22/2022 10/21/2022 04/21/2023  Labs for ITP Cardiac and Pulmonary Rehab  Cholestrol 100 - 199 mg/dL 892  889  880  882  895   LDL (calc) 0 - 99 mg/dL 49  45  49  44  44   HDL-C >39 mg/dL 44  54  58  61  47   Trlycerides 0 - 149 mg/dL 65  39  51  51  56     Capillary Blood Glucose: Lab Results  Component Value Date   GLUCAP 93 12/08/2017   GLUCAP 106 (H) 04/29/2017   GLUCAP 105 (H) 12/02/2016     Pulmonary Assessment Scores:  Pulmonary Assessment Scores     Row Name 08/17/23 1008         ADL UCSD   ADL Phase Entry     SOB Score total 31     Rest 0     Walk 1     Stairs 3     Bath 2     Dress 1     Shop 2       CAT Score   CAT Score 15       mMRC Score   mMRC Score 2       UCSD: Self-administered rating of dyspnea associated with activities of daily living (ADLs) 6-point scale (0 = not at all to 5 = maximal or unable to do because of breathlessness)  Scoring Scores range from 0 to 120.  Minimally important difference is 5 units  CAT: CAT can identify the health impairment of COPD patients and is better correlated with disease progression.  CAT has a scoring range of zero to 40. The CAT score is classified into four groups of low (less than 10), medium (10 - 20), high (21-30) and very high (31-40) based on the impact level of disease on health status. A CAT score over 10 suggests significant symptoms.  A worsening CAT score could be explained by an exacerbation, poor medication adherence, poor inhaler technique, or progression of COPD or comorbid conditions.  CAT MCID is 2 points  mMRC: mMRC (Modified Medical Research Council) Dyspnea Scale is used to assess the degree of  baseline functional disability in patients of respiratory disease due to dyspnea. No minimal important difference is established. A decrease in score of 1 point or greater is considered a positive change.   Pulmonary Function Assessment:   Exercise Target Goals: Exercise Program Goal: Individual exercise prescription set using results from initial 6 min walk test and THRR while considering  patient's activity barriers and safety.  Exercise Prescription Goal: Initial exercise prescription builds to 30-45 minutes a day of aerobic activity, 2-3 days per week.  Home exercise guidelines will be given to patient during program as part of exercise prescription that the participant will acknowledge.  Activity Barriers & Risk Stratification:  Activity Barriers & Cardiac Risk Stratification - 08/17/23 0832       Activity Barriers & Cardiac Risk Stratification   Activity Barriers Shortness of Breath;Deconditioning;Muscular Weakness;Right Hip Replacement;Arthritis;Balance Concerns   TRHR 2017.   Cardiac Risk Stratification Moderate          6 Minute Walk:  6 Minute Walk     Row Name 08/17/23 0939         6 Minute Walk   Phase Initial     Distance 1190 feet     Walk Time 6 minutes     # of Rest Breaks 0     MPH 2.25     METS 2.28     RPE 12     Perceived Dyspnea  1     VO2 Peak 7.96     Symptoms No     Resting HR 81 bpm     Resting BP 90/60     Resting Oxygen Saturation  92 %     Exercise Oxygen Saturation  during 6 min walk 88 %     Max Ex. HR 92 bpm     Max Ex. BP 106/62     2 Minute Post BP 98/60       Interval HR   1 Minute HR 88     2 Minute HR 99     3 Minute HR 98     4 Minute HR 92     5 Minute HR 92     6 Minute HR 92     2 Minute Post HR 83     Interval Heart Rate? Yes       Interval Oxygen   Interval Oxygen? Yes     Baseline Oxygen Saturation % 92 %     1 Minute Oxygen Saturation % 92 %     1 Minute Liters of Oxygen 0 L     2 Minute Oxygen Saturation %  90 %     2 Minute Liters of Oxygen 0 L     3 Minute Oxygen Saturation % 89 %     3 Minute Liters of Oxygen 0 L     4 Minute Oxygen Saturation % 88 %     4 Minute Liters of Oxygen 0 L     5 Minute Oxygen Saturation % 90 %     5 Minute Liters of Oxygen 0 L     6 Minute Oxygen Saturation % 89 %     6 Minute Liters of Oxygen 0 L     2 Minute Post Oxygen Saturation % 93 %     2 Minute Post Liters of Oxygen 0 L        Oxygen Initial Assessment:  Oxygen Initial Assessment - 08/17/23 1007       Home Oxygen   Home Oxygen Device None    Sleep Oxygen Prescription None      Initial 6 min Walk   Oxygen Used None      Program Oxygen Prescription   Program Oxygen Prescription None      Intervention   Short Term Goals To learn and understand importance of monitoring SPO2 with pulse oximeter and demonstrate accurate use of  the pulse oximeter.;To learn and understand importance of maintaining oxygen saturations>88%;To learn and demonstrate proper pursed lip breathing techniques or other breathing techniques. ;To learn and demonstrate proper use of respiratory medications    Long  Term Goals Verbalizes importance of monitoring SPO2 with pulse oximeter and return demonstration;Maintenance of O2 saturations>88%;Exhibits proper breathing techniques, such as pursed lip breathing or other method taught during program session;Compliance with respiratory medication;Demonstrates proper use of MDI's          Oxygen Re-Evaluation:  Oxygen Re-Evaluation     Row Name 08/18/23 1034             Goals/Expected Outcomes   Comments Reviewed PLB technique with pt.  Talked about how it works and it's importance in maintaining their exercise saturations.       Goals/Expected Outcomes Short: Become more profiecient at using PLB.   Long: Become independent at using PLB.          Oxygen Discharge (Final Oxygen Re-Evaluation):  Oxygen Re-Evaluation - 08/18/23 1034       Goals/Expected Outcomes    Comments Reviewed PLB technique with pt.  Talked about how it works and it's importance in maintaining their exercise saturations.    Goals/Expected Outcomes Short: Become more profiecient at using PLB.   Long: Become independent at using PLB.          Initial Exercise Prescription:  Initial Exercise Prescription - 08/17/23 0900       Date of Initial Exercise RX and Referring Provider   Date 08/17/23    Referring Provider Freddy Blinks MD      Treadmill   MPH 1.5    Grade 0    Minutes 15    METs 2.23      NuStep   Level 2    SPM 50    Minutes 15    METs 1.9      Prescription Details   Frequency (times per week) 2    Duration Progress to 30 minutes of continuous aerobic without signs/symptoms of physical distress      Intensity   THRR 40-80% of Max Heartrate 108-134    Ratings of Perceived Exertion 11-13    Perceived Dyspnea 0-4      Progression   Progression Continue to progress workloads to maintain intensity without signs/symptoms of physical distress.      Resistance Training   Training Prescription Yes    Weight 4    Reps 10-15          Perform Capillary Blood Glucose checks as needed.  Exercise Prescription Changes:   Exercise Prescription Changes     Row Name 08/17/23 0900 09/08/23 1500 09/24/23 1500 10/21/23 1300       Response to Exercise   Blood Pressure (Admit) 90/60 118/60 124/72 108/60    Blood Pressure (Exercise) 106/62 132/72 -- --    Blood Pressure (Exit) 98/60 112/60 100/74 128/72    Heart Rate (Admit) 81 bpm 76 bpm 65 bpm 68 bpm    Heart Rate (Exercise) 92 bpm 86 bpm 80 bpm 84 bpm    Heart Rate (Exit) 83 bpm 78 bpm 74 bpm 78 bpm    Oxygen Saturation (Admit) 92 % 95 % 94 % 94 %    Oxygen Saturation (Exercise) 88 % 90 % 92 % 92 %    Oxygen Saturation (Exit) 93 % 94 % 93 % 94 %    Rating of Perceived Exertion (Exercise) 12 13 12 11     Perceived  Dyspnea (Exercise) 1 2 1 1     Duration -- Continue with 30 min of aerobic exercise  without signs/symptoms of physical distress. Continue with 30 min of aerobic exercise without signs/symptoms of physical distress. Continue with 30 min of aerobic exercise without signs/symptoms of physical distress.    Intensity -- THRR unchanged THRR unchanged THRR unchanged      Progression   Progression -- Continue to progress workloads to maintain intensity without signs/symptoms of physical distress. Continue to progress workloads to maintain intensity without signs/symptoms of physical distress. Continue to progress workloads to maintain intensity without signs/symptoms of physical distress.      Resistance Training   Training Prescription -- Yes Yes Yes    Weight -- 4 4 4     Reps -- 10-15 10-15 10-15      Treadmill   MPH -- 2 2 2.7    Grade -- 1 0 0    Minutes -- 15 15 15     METs -- 2.81 2.53 3.07      NuStep   Level -- 4 5 5     SPM -- 85 81 94    Minutes -- 15 15 15     METs -- 2.5 2.6 3       Exercise Comments:   Exercise Comments     Row Name 08/18/23 1033           Exercise Comments First full day of exercise!  Patient was oriented to gym and equipment including functions, settings, policies, and procedures.  Patient's individual exercise prescription and treatment plan were reviewed.  All starting workloads were established based on the results of the 6 minute walk test done at initial orientation visit.  The plan for exercise progression was also introduced and progression will be customized based on patient's performance and goals.          Exercise Goals and Review:   Exercise Goals     Row Name 08/17/23 [phone]             Exercise Goals   Increase Physical Activity Yes       Intervention Provide advice, education, support and counseling about physical activity/exercise needs.;Develop an individualized exercise prescription for aerobic and resistive training based on initial evaluation findings, risk stratification, comorbidities and participant's  personal goals.       Expected Outcomes Long Term: Add in home exercise to make exercise part of routine and to increase amount of physical activity.;Short Term: Attend rehab on a regular basis to increase amount of physical activity.;Long Term: Exercising regularly at least 3-5 days a week.       Increase Strength and Stamina Yes       Intervention Provide advice, education, support and counseling about physical activity/exercise needs.;Develop an individualized exercise prescription for aerobic and resistive training based on initial evaluation findings, risk stratification, comorbidities and participant's personal goals.       Expected Outcomes Short Term: Increase workloads from initial exercise prescription for resistance, speed, and METs.;Short Term: Perform resistance training exercises routinely during rehab and add in resistance training at home;Long Term: Improve cardiorespiratory fitness, muscular endurance and strength as measured by increased METs and functional capacity ( )       Able to understand and use rate of perceived exertion (RPE) scale Yes       Intervention Provide education and explanation on how to use RPE scale       Expected Outcomes Short Term: Able to use RPE daily in rehab to  express subjective intensity level;Long Term:  Able to use RPE to guide intensity level when exercising independently       Able to understand and use Dyspnea scale Yes       Intervention Provide education and explanation on how to use Dyspnea scale       Expected Outcomes Short Term: Able to use Dyspnea scale daily in rehab to express subjective sense of shortness of breath during exertion;Long Term: Able to use Dyspnea scale to guide intensity level when exercising independently       Knowledge and understanding of Target Heart Rate Range (THRR) Yes       Intervention Provide education and explanation of THRR including how the numbers were predicted and where they are located for reference        Expected Outcomes Short Term: Able to state/look up THRR;Long Term: Able to use THRR to govern intensity when exercising independently;Short Term: Able to use daily as guideline for intensity in rehab       Able to check pulse independently Yes       Intervention Provide education and demonstration on how to check pulse in carotid and radial arteries.;Review the importance of being able to check your own pulse for safety during independent exercise       Expected Outcomes Short Term: Able to explain why pulse checking is important during independent exercise;Long Term: Able to check pulse independently and accurately       Understanding of Exercise Prescription Yes       Intervention Provide education, explanation, and written materials on patient's individual exercise prescription       Expected Outcomes Short Term: Able to explain program exercise prescription;Long Term: Able to explain home exercise prescription to exercise independently          Exercise Goals Re-Evaluation :  Exercise Goals Re-Evaluation     Row Name 08/18/23 1033 10/01/23 1303           Exercise Goal Re-Evaluation   Exercise Goals Review Knowledge and understanding of Target Heart Rate Range (THRR);Able to understand and use rate of perceived exertion (RPE) scale Increase Physical Activity;Increase Strength and Stamina;Understanding of Exercise Prescription      Comments Reviewed RPE and dyspnea scale, THR and program prescription with pt today.  Pt voiced understanding and was given a copy of goals to take home. Ron is doing well in rehab. He is feeling good with exercise and is feeling an increase in his endurance. He is walking some at home due to heat and weather. He has 4lbs hand weights and resistance bands at home that he has been using. He is increasing his levels in rehab by walking speed on the treadmill and increasing level on the nustep. Will continue to monitor and progress as able.      Expected Outcomes  Short: Use RPE daily to regulate intensity.  Long: Follow program prescription in THR. Short: increase hand weight lbs if able in the next week or two   long: continue to exercise at [facility] and attend rehab         Discharge Exercise Prescription (Final Exercise Prescription Changes):  Exercise Prescription Changes - 10/21/23 1300       Response to Exercise   Blood Pressure (Admit) 108/60    Blood Pressure (Exit) 128/72    Heart Rate (Admit) 68 bpm    Heart Rate (Exercise) 84 bpm    Heart Rate (Exit) 78 bpm    Oxygen Saturation (Admit) 94 %  Oxygen Saturation (Exercise) 92 %    Oxygen Saturation (Exit) 94 %    Rating of Perceived Exertion (Exercise) 11    Perceived Dyspnea (Exercise) 1    Duration Continue with 30 min of aerobic exercise without signs/symptoms of physical distress.    Intensity THRR unchanged      Progression   Progression Continue to progress workloads to maintain intensity without signs/symptoms of physical distress.      Resistance Training   Training Prescription Yes    Weight 4    Reps 10-15      Treadmill   MPH 2.7    Grade 0    Minutes 15    METs 3.07      NuStep   Level 5    SPM 94    Minutes 15    METs 3          Nutrition:  Target Goals: Understanding of nutrition guidelines, daily intake of sodium 1500mg , cholesterol 200mg , calories 30% from fat and 7% or less from saturated fats, daily to have 5 or more servings of fruits and vegetables.  Biometrics:  Pre Biometrics - 08/17/23 0943       Pre Biometrics   Height 5' 5.5 (1.664 m)    Weight 75.8 kg    Waist Circumference 36 inches    Hip Circumference 37.5 inches    Waist to Hip Ratio 0.96 %    BMI (Calculated) 27.38    Grip Strength 17.4 kg           Nutrition Therapy Plan and Nutrition Goals:   Nutrition Assessments:  MEDIFICTS Score Key: >=70 Need to make dietary changes  40-70 Heart Healthy Diet <= 40 Therapeutic Level Cholesterol Diet  Flowsheet Row  PULMONARY REHAB OTHER RESP ORIENTATION from 08/17/2023 in [facility] CARDIAC REHABILITATION  Picture Your Plate Total Score on Admission 47   Picture Your Plate Scores: <59 Unhealthy dietary pattern with much room for improvement. 41-50 Dietary pattern unlikely to meet recommendations for good health and room for improvement. 51-60 More healthful dietary pattern, with some room for improvement.  >60 Healthy dietary pattern, although there may be some specific behaviors that could be improved.    Nutrition Goals Re-Evaluation:  Nutrition Goals Re-Evaluation     Row Name 10/01/23 1309             Goals   Nutrition Goal healthy eating       Comment Ron is doing well in rehab. He did not have a good apitie for a while but has finally got this back. He has gain a few lbs over the part month since getting his apitie back. His wife is ooking healthy options. Ron is a sweets lovers and does enjoy a nutty buddy or 2 a day. He is drinking mostly water, does not drink a lot of soads and only will have tea in a resturant       Expected Outcome Short: cut back on nutty buddies each day   long : continue to drnk water and pick healthy options          Nutrition Goals Discharge (Final Nutrition Goals Re-Evaluation):  Nutrition Goals Re-Evaluation - 10/01/23 1309       Goals   Nutrition Goal healthy eating    Comment Ron is doing well in rehab. He did not have a good apitie for a while but has finally got this back. He has gain a few lbs over the part month since getting  his apitie back. His wife is ooking healthy options. Ron is a sweets lovers and does enjoy a nutty buddy or 2 a day. He is drinking mostly water, does not drink a lot of soads and only will have tea in a resturant    Expected Outcome Short: cut back on nutty buddies each day   long : continue to drnk water and pick healthy options          Psychosocial: Target Goals: Acknowledge presence or absence of significant depression  and/or stress, maximize coping skills, provide positive support system. Participant is able to verbalize types and ability to use techniques and skills needed for reducing stress and depression.  Initial Review & Psychosocial Screening:  Initial Psych Review & Screening - 08/17/23 1008       Initial Review   Current issues with Current Sleep Concerns;Current Psychotropic Meds      Family Dynamics   Good Support System? Yes      Barriers   Psychosocial barriers to participate in program There are no identifiable barriers or psychosocial needs.      Screening Interventions   Interventions Encouraged to exercise;To provide support and resources with identified psychosocial needs;Provide feedback about the scores to participant    Expected Outcomes Short Term goal: Utilizing psychosocial counselor, staff and physician to assist with identification of specific Stressors or current issues interfering with healing process. Setting desired goal for each stressor or current issue identified.;Long Term Goal: Stressors or current issues are controlled or eliminated.;Short Term goal: Identification and review with participant of any Quality of Life or Depression concerns found by scoring the questionnaire.;Long Term goal: The participant improves quality of Life and PHQ9 Scores as seen by post scores and/or verbalization of changes          Quality of Life Scores:  Scores of 19 and below usually indicate a poorer quality of life in these areas.  A difference of  2-3 points is a clinically meaningful difference.  A difference of 2-3 points in the total score of the Quality of Life Index has been associated with significant improvement in overall quality of life, self-image, physical symptoms, and general health in studies assessing change in quality of life.  PHQ-9: Review Flowsheet  More data exists      08/17/2023 04/21/2023 01/28/2023 10/21/2022 08/04/2022  Depression screen PHQ 2/9  Decreased  Interest 0 0 0 0 0  Down, Depressed, Hopeless 0 0 0 0 0  PHQ - 2 Score 0 0 0 0 0  Altered sleeping 3 0 - - -  Tired, decreased energy 1 0 - - -  Change in appetite 2 0 - - -  Feeling bad or failure about yourself  0 0 - - -  Trouble concentrating 0 0 - - -  Moving slowly or fidgety/restless 0 0 - - -  Suicidal thoughts 0 0 - - -  PHQ-9 Score 6 0 - - -  Difficult doing work/chores Somewhat difficult Not difficult at all - - -   Interpretation of Total Score  Total Score Depression Severity:  1-4 = Minimal depression, 5-9 = Mild depression, 10-14 = Moderate depression, 15-19 = Moderately severe depression, 20-27 = Severe depression   Psychosocial Evaluation and Intervention:  Psychosocial Evaluation - 08/17/23 1009       Psychosocial Evaluation & Interventions   Interventions Stress management education;Relaxation education;Encouraged to exercise with the program and follow exercise prescription    Comments Patient was referred to PR with  chronic systolic HF from [facility] [facility]. His initial PHQ-9 score was 6 due to sleep issues and lack of energy. He says he is taking Lexapro  for anxiety which works very well. He has difficulty falling and staying asleep. He has been diagnosed with OSA but refuses to wear a CPAP. He says he has to get up frequently at nigth with nocturia and feels a CPAP would not help this. He denies any depression of stressors. He lives with his wife who is his main support along with his sister and neices and nephews. His goals for the program are to be able to do all the activities he wants to do; improve his breathing and energy.    Expected Outcomes Short Term: Patient will start the program and attend consistently. Long Term: Patient will complete the program meeting personal goals.    Continue Psychosocial Services  No Follow up required          Psychosocial Re-Evaluation:  Psychosocial Re-Evaluation     Row Name 10/01/23 1307             Psychosocial  Re-Evaluation   Current issues with Current Sleep Concerns       Comments Ron is doing well in rehab. He stated that he does have some trouble sleeping. He does have an elevated bed that sits hip up enough with his pillow that he is able to sleep well. He has a sleep study done about 10 years ago and they wanted him to use a cpap but he did not want to. He does get up some during the night to use thye restroom but that is it.       Expected Outcomes Short: montior sleep if it gets worse contact MD    long: continue to have no stressors       Interventions Encouraged to attend Pulmonary Rehabilitation for the exercise       Continue Psychosocial Services  Follow up required by staff          Psychosocial Discharge (Final Psychosocial Re-Evaluation):  Psychosocial Re-Evaluation - 10/01/23 1307       Psychosocial Re-Evaluation   Current issues with Current Sleep Concerns    Comments Ron is doing well in rehab. He stated that he does have some trouble sleeping. He does have an elevated bed that sits hip up enough with his pillow that he is able to sleep well. He has a sleep study done about 10 years ago and they wanted him to use a cpap but he did not want to. He does get up some during the night to use thye restroom but that is it.    Expected Outcomes Short: montior sleep if it gets worse contact MD    long: continue to have no stressors    Interventions Encouraged to attend Pulmonary Rehabilitation for the exercise    Continue Psychosocial Services  Follow up required by staff          Education: Education Goals: Education classes will be provided on a weekly basis, covering required topics. Participant will state understanding/return demonstration of topics presented.  Learning Barriers/Preferences:  Learning Barriers/Preferences - 08/17/23 9166       Learning Barriers/Preferences   Learning Barriers None    Learning Preferences Skilled Demonstration          Education  Topics: Know Your Numbers Group instruction that is supported by a PowerPoint presentation. Instructor discusses importance of knowing and understanding resting, exercise, and post-exercise oxygen saturation, heart rate,  and blood pressure. Oxygen saturation, heart rate, blood pressure, rating of perceived exertion, and dyspnea are reviewed along with a normal range for these values.    Exercise for the Pulmonary Patient Group instruction that is supported by a PowerPoint presentation. Instructor discusses benefits of exercise, core components of exercise, frequency, duration, and intensity of an exercise routine, importance of utilizing pulse oximetry during exercise, safety while exercising, and options of places to exercise outside of rehab.    MET Level  Group instruction provided by PowerPoint, verbal discussion, and written material to support subject matter. Instructor reviews what METs are and how to increase METs.    Pulmonary Medications Verbally interactive group education provided by instructor with focus on inhaled medications and proper administration.   Anatomy and Physiology of the Respiratory System Group instruction provided by PowerPoint, verbal discussion, and written material to support subject matter. Instructor reviews respiratory cycle and anatomical components of the respiratory system and their functions. Instructor also reviews differences in obstructive and restrictive respiratory diseases with examples of each.    Oxygen Safety Group instruction provided by PowerPoint, verbal discussion, and written material to support subject matter. There is an overview of "What is Oxygen" and "Why do we need it".  Instructor also reviews how to create a safe environment for oxygen use, the importance of using oxygen as prescribed, and the risks of noncompliance. There is a brief discussion on traveling with oxygen and resources the patient may utilize.   Oxygen Use Group  instruction provided by PowerPoint, verbal discussion, and written material to discuss how supplemental oxygen is prescribed and different types of oxygen supply systems. Resources for more information are provided.    Breathing Techniques Group instruction that is supported by demonstration and informational handouts. Instructor discusses the benefits of pursed lip and diaphragmatic breathing and detailed demonstration on how to perform both.     Risk Factor Reduction Group instruction that is supported by a PowerPoint presentation. Instructor discusses the definition of a risk factor, different risk factors for pulmonary disease, and how the heart and lungs work together.   Pulmonary Diseases Group instruction provided by PowerPoint, verbal discussion, and written material to support subject matter. Instructor gives an overview of the different type of pulmonary diseases. There is also a discussion on risk factors and symptoms as well as ways to manage the diseases.   Stress and Energy Conservation Group instruction provided by PowerPoint, verbal discussion, and written material to support subject matter. Instructor gives an overview of stress and the impact it can have on the body. Instructor also reviews ways to reduce stress. There is also a discussion on energy conservation and ways to conserve energy throughout the day.   Warning Signs and Symptoms Group instruction provided by PowerPoint, verbal discussion, and written material to support subject matter. Instructor reviews warning signs and symptoms of stroke, heart attack, cold and flu. Instructor also reviews ways to prevent the spread of infection.   Other Education Group or individual verbal, written, or video instructions that support the educational goals of the pulmonary rehab program.    Knowledge Questionnaire Score:  Knowledge Questionnaire Score - 08/17/23 1006       Knowledge Questionnaire Score   Pre Score 9/18           Core Components/Risk Factors/Patient Goals at Admission:  Personal Goals and Risk Factors at Admission - 08/17/23 1004       Core Components/Risk Factors/Patient Goals on Admission    Weight Management Weight  Maintenance    Improve shortness of breath with ADL's Yes    Intervention Provide education, individualized exercise plan and daily activity instruction to help decrease symptoms of SOB with activities of daily living.    Expected Outcomes Short Term: Improve cardiorespiratory fitness to achieve a reduction of symptoms when performing ADLs;Long Term: Be able to perform more ADLs without symptoms or delay the onset of symptoms    Heart Failure Yes    Intervention Provide a combined exercise and nutrition program that is supplemented with education, support and counseling about heart failure. Directed toward relieving symptoms such as shortness of breath, decreased exercise tolerance, and extremity edema.    Expected Outcomes Improve functional capacity of life;Short term: Daily weights obtained and reported for increase. Utilizing diuretic protocols set by physician.;Long term: Adoption of self-care skills and reduction of barriers for early signs and symptoms recognition and intervention leading to self-care maintenance.;Short term: Attendance in program 2-3 days a week with increased exercise capacity. Reported lower sodium intake. Reported increased fruit and vegetable intake. Reports medication compliance.    Hypertension Yes    Intervention Provide education on lifestyle modifcations including regular physical activity/exercise, weight management, moderate sodium restriction and increased consumption of fresh fruit, vegetables, and low fat dairy, alcohol moderation, and smoking cessation.;Monitor prescription use compliance.    Expected Outcomes Short Term: Continued assessment and intervention until BP is < 140/28mm HG in hypertensive participants. < 130/61mm HG in hypertensive  participants with diabetes, heart failure or chronic kidney disease.;Long Term: Maintenance of blood pressure at goal levels.    Lipids Yes    Intervention Provide education and support for participant on nutrition & aerobic/resistive exercise along with prescribed medications to achieve LDL 70mg , HDL >40mg .    Expected Outcomes Short Term: Participant states understanding of desired cholesterol values and is compliant with medications prescribed. Participant is following exercise prescription and nutrition guidelines.;Long Term: Cholesterol controlled with medications as prescribed, with individualized exercise RX and with personalized nutrition plan. Value goals: LDL < 70mg , HDL > 40 mg.          Core Components/Risk Factors/Patient Goals Review:   Goals and Risk Factor Review     Row Name 10/01/23 1352             Core Components/Risk Factors/Patient Goals Review   Personal Goals Review Weight Management/Obesity;Improve shortness of breath with ADL's       Review Ron is doing well in rehab. He has gain a little weight the past month due to gaining his apitite back. He has no stess and has been working on his sleep.       Expected Outcomes Short: monitor food and weight for managment  long: keep exercise for overall happienss          Core Components/Risk Factors/Patient Goals at Discharge (Final Review):   Goals and Risk Factor Review - 10/01/23 1352       Core Components/Risk Factors/Patient Goals Review   Personal Goals Review Weight Management/Obesity;Improve shortness of breath with ADL's    Review Ron is doing well in rehab. He has gain a little weight the past month due to gaining his apitite back. He has no stess and has been working on his sleep.    Expected Outcomes Short: monitor food and weight for managment  long: keep exercise for overall happienss          ITP Comments:  ITP Comments     Row Name 08/18/23 1033 09/09/23 0920 10/07/23 1147  11/02/23 0953      ITP Comments First full day of exercise!  Patient was oriented to gym and equipment including functions, settings, policies, and procedures.  Patient's individual exercise prescription and treatment plan were reviewed.  All starting workloads were established based on the results of the 6 minute walk test done at initial orientation visit.  The plan for exercise progression was also introduced and progression will be customized based on patient's performance and goals. 30 day review completed. ITP sent to Dr.Jehanzeb Memon, Medical Director of  Pulmonary Rehab. Continue with ITP unless changes are made by physician.  New to program 30 day review completed. ITP sent to Dr.Jehanzeb Memon, Medical Director of  Pulmonary Rehab. Continue with ITP unless changes are made by physician. 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 Discharging from [facility].       Comments: Discharge ITP.

## 2023-11-03 ENCOUNTER — Encounter (HOSPITAL_COMMUNITY)
Admission: RE | Admit: 2023-11-03 | Discharge: 2023-11-03 | Disposition: A | Discharge status: Home / Self Care | Source: Ambulatory Visit | Attending: Internal Medicine | Admitting: Internal Medicine

## 2023-11-03 ENCOUNTER — Encounter (HOSPITAL_COMMUNITY)

## 2023-11-03 VITALS — Ht 67.0 in | Wt 181.3 lb

## 2023-11-03 DIAGNOSIS — I5022 Chronic systolic (congestive) heart failure: Secondary | ICD-10-CM | POA: Diagnosis not present

## 2023-11-03 DIAGNOSIS — Z952 Presence of prosthetic heart valve: Secondary | ICD-10-CM | POA: Diagnosis not present

## 2023-11-03 NOTE — Patient Instructions (Signed)
 Patient Instructions  Patient Details  Name: Terry Pacheco MRN: [id number] Date of Birth: 1950-08-06 Referring Provider:  Donata Snowman, PA*  Below are your personal goals for exercise, nutrition, and risk factors. Our goal is to help you stay on track towards obtaining and maintaining these goals. We will be discussing your progress on these goals with you throughout the program.  Initial Exercise Prescription:  Initial Exercise Prescription - 11/03/23 1300       Date of Initial Exercise RX and Referring Provider   Date 11/03/23    Referring Provider Barnet Cough MD      Oxygen   Maintain Oxygen Saturation 88% or higher      Treadmill   MPH 2.2    Grade 1    Minutes 15    METs 2.99      REL-XR   Level 3    Speed 50    Minutes 15    METs 3      Prescription Details   Frequency (times per week) 2    Duration Progress to 30 minutes of continuous aerobic without signs/symptoms of physical distress      Intensity   THRR 40-80% of Max Heartrate 99-131    Ratings of Perceived Exertion 11-13    Perceived Dyspnea 0-4      Progression   Progression Continue to progress workloads to maintain intensity without signs/symptoms of physical distress.      Resistance Training   Training Prescription Yes    Weight 4 lb    Reps 10-15          Exercise Goals: Frequency: Be able to perform aerobic exercise two to three times per week in program working toward 2-5 days per week of home exercise.  Intensity: Work with a perceived exertion of 11 (fairly light) - 15 (hard) while following your exercise prescription.  We will make changes to your prescription with you as you progress through the program.   Duration: Be able to do 30 to 45 minutes of continuous aerobic exercise in addition to a 5 minute warm-up and a 5 minute cool-down routine.   Nutrition Goals: Your personal nutrition goals will be established when you do your nutrition analysis with the  dietician.  The following are general nutrition guidelines to follow: Cholesterol < 200mg /day Sodium < 1500mg /day Fiber: Men over 50 yrs - 30 grams per day  Personal Goals:  Personal Goals and Risk Factors at Admission - 11/03/23 1326       Core Components/Risk Factors/Patient Goals on Admission    Weight Management Weight Maintenance;Yes    Intervention Weight Management: Develop a combined nutrition and exercise program designed to reach desired caloric intake, while maintaining appropriate intake of nutrient and fiber, sodium and fats, and appropriate energy expenditure required for the weight goal.;Weight Management: Provide education and appropriate resources to help participant work on and attain dietary goals.    Admit Weight 181 lb 4.8 oz (82.2 kg)    Goal Weight: Short Term 180 lb (81.6 kg)    Goal Weight: Long Term 180 lb (81.6 kg)    Expected Outcomes Short Term: Continue to assess and modify interventions until short term weight is achieved;Long Term: Adherence to nutrition and physical activity/exercise program aimed toward attainment of established weight goal;Weight Maintenance: Understanding of the daily nutrition guidelines, which includes 25-35% calories from fat, 7% or less cal from saturated fats, less than 200mg  cholesterol, less than 1.5gm of sodium, & 5 or more servings of fruits  and vegetables daily    Improve shortness of breath with ADL's Yes    Intervention Provide education, individualized exercise plan and daily activity instruction to help decrease symptoms of SOB with activities of daily living.    Expected Outcomes Short Term: Improve cardiorespiratory fitness to achieve a reduction of symptoms when performing ADLs;Long Term: Be able to perform more ADLs without symptoms or delay the onset of symptoms    Heart Failure Yes    Intervention Provide a combined exercise and nutrition program that is supplemented with education, support and counseling about heart  failure. Directed toward relieving symptoms such as shortness of breath, decreased exercise tolerance, and extremity edema.    Expected Outcomes Improve functional capacity of life;Short term: Daily weights obtained and reported for increase. Utilizing diuretic protocols set by physician.;Long term: Adoption of self-care skills and reduction of barriers for early signs and symptoms recognition and intervention leading to self-care maintenance.;Short term: Attendance in program 2-3 days a week with increased exercise capacity. Reported lower sodium intake. Reported increased fruit and vegetable intake. Reports medication compliance.    Hypertension Yes    Intervention Provide education on lifestyle modifcations including regular physical activity/exercise, weight management, moderate sodium restriction and increased consumption of fresh fruit, vegetables, and low fat dairy, alcohol moderation, and smoking cessation.;Monitor prescription use compliance.    Expected Outcomes Short Term: Continued assessment and intervention until BP is < 140/30mm HG in hypertensive participants. < 130/3mm HG in hypertensive participants with diabetes, heart failure or chronic kidney disease.;Long Term: Maintenance of blood pressure at goal levels.    Lipids Yes    Intervention Provide education and support for participant on nutrition & aerobic/resistive exercise along with prescribed medications to achieve LDL 70mg , HDL >40mg .    Expected Outcomes Short Term: Participant states understanding of desired cholesterol values and is compliant with medications prescribed. Participant is following exercise prescription and nutrition guidelines.;Long Term: Cholesterol controlled with medications as prescribed, with individualized exercise RX and with personalized nutrition plan. Value goals: LDL < 70mg , HDL > 40 mg.          Tobacco Use Initial Evaluation: Social History   Tobacco Use  Smoking Status Former   Current  packs/day: 0.00   Average packs/day: 0.5 packs/day for 45.0 years (22.5 ttl pk-yrs)   Types: Cigarettes   Start date: 01/12/1983   Quit date: 11/17/2016   Years since quitting: 6.9  Smokeless Tobacco Never    Exercise Goals and Review:  Exercise Goals     Row Name 08/17/23 0942             Exercise Goals   Increase Physical Activity Yes       Intervention Provide advice, education, support and counseling about physical activity/exercise needs.;Develop an individualized exercise prescription for aerobic and resistive training based on initial evaluation findings, risk stratification, comorbidities and participant's personal goals.       Expected Outcomes Long Term: Add in home exercise to make exercise part of routine and to increase amount of physical activity.;Short Term: Attend rehab on a regular basis to increase amount of physical activity.;Long Term: Exercising regularly at least 3-5 days a week.       Increase Strength and Stamina Yes       Intervention Provide advice, education, support and counseling about physical activity/exercise needs.;Develop an individualized exercise prescription for aerobic and resistive training based on initial evaluation findings, risk stratification, comorbidities and participant's personal goals.       Expected Outcomes Short  Term: Increase workloads from initial exercise prescription for resistance, speed, and METs.;Short Term: Perform resistance training exercises routinely during rehab and add in resistance training at home;Long Term: Improve cardiorespiratory fitness, muscular endurance and strength as measured by increased METs and functional capacity ( )       Able to understand and use rate of perceived exertion (RPE) scale Yes       Intervention Provide education and explanation on how to use RPE scale       Expected Outcomes Short Term: Able to use RPE daily in rehab to express subjective intensity level;Long Term:  Able to use RPE to guide  intensity level when exercising independently       Able to understand and use Dyspnea scale Yes       Intervention Provide education and explanation on how to use Dyspnea scale       Expected Outcomes Short Term: Able to use Dyspnea scale daily in rehab to express subjective sense of shortness of breath during exertion;Long Term: Able to use Dyspnea scale to guide intensity level when exercising independently       Knowledge and understanding of Target Heart Rate Range (THRR) Yes       Intervention Provide education and explanation of THRR including how the numbers were predicted and where they are located for reference       Expected Outcomes Short Term: Able to state/look up THRR;Long Term: Able to use THRR to govern intensity when exercising independently;Short Term: Able to use daily as guideline for intensity in rehab       Able to check pulse independently Yes       Intervention Provide education and demonstration on how to check pulse in carotid and radial arteries.;Review the importance of being able to check your own pulse for safety during independent exercise       Expected Outcomes Short Term: Able to explain why pulse checking is important during independent exercise;Long Term: Able to check pulse independently and accurately       Understanding of Exercise Prescription Yes       Intervention Provide education, explanation, and written materials on patient's individual exercise prescription       Expected Outcomes Short Term: Able to explain program exercise prescription;Long Term: Able to explain home exercise prescription to exercise independently        Copy of goals given to participant.

## 2023-11-03 NOTE — Progress Notes (Signed)
 Cardiac Individual Treatment Plan  Patient Details  Name: Terry Pacheco MRN: [id number] Date of Birth: Sep 08, 1950 Referring Provider:   Flowsheet Row CARDIAC REHAB PHASE II ORIENTATION from 11/03/2023 in [facility]  Referring Provider Barnet Cough MD    Initial Encounter Date:  Flowsheet Row CARDIAC REHAB PHASE II ORIENTATION from 11/03/2023 in [facility]  Date 11/03/23    Visit Diagnosis: S/P TAVR (transcatheter aortic valve replacement)  Patient's Home Medications on Admission:  Current Outpatient Medications:    acetaminophen  (TYLENOL ) 500 MG tablet, Take 1,000 mg by mouth daily as needed for moderate pain or headache., Disp: , Rfl:    albuterol  (ACCUNEB ) 1.25 MG/3ML nebulizer solution, Take 3 mLs (1.25 mg total) by nebulization every 6 (six) hours as needed for wheezing., Disp: 75 mL, Rfl: 12   amLODipine  (NORVASC ) 10 MG tablet, TAKE 1 TABLET EVERY DAY FOR FOR BLOOD PRESSURE, Disp: 90 tablet, Rfl: 3   amoxicillin -clavulanate (AUGMENTIN ) 875-125 MG tablet, Take 1 tablet by mouth 2 (two) times daily. Take all of this medication, Disp: 20 tablet, Rfl: 0   aspirin  EC 81 MG tablet, Take 81 mg by mouth daily., Disp: , Rfl:    atorvastatin  (LIPITOR) 40 MG tablet, Take 1 tablet (40 mg total) by mouth daily., Disp: 90 tablet, Rfl: 3   B Complex CAPS, TAKE 1 CAPSULE BY MOUTH EVERY DAY, Disp: 100 capsule, Rfl: 2   calcium  carbonate (TUMS - DOSED IN MG ELEMENTAL CALCIUM ) 500 MG chewable tablet, Chew 2 tablets by mouth daily as needed for indigestion or heartburn., Disp: , Rfl:    empagliflozin (JARDIANCE) 10 MG TABS tablet, Take 1 tablet by mouth daily., Disp: , Rfl:    escitalopram  (LEXAPRO ) 20 MG tablet, TAKE 1 TABLET BY MOUTH EVERY DAY, Disp: 90 tablet, Rfl: 0   ezetimibe  (ZETIA ) 10 MG tablet, Take 1 tablet (10 mg total) by mouth daily., Disp: 90 tablet, Rfl: 0   fexofenadine -pseudoephedrine  (ALLEGRA-D 24) 180-240 MG 24 hr tablet, Take 1 tablet  by mouth every evening. For allergy and congestion, Disp: 30 tablet, Rfl: 11   finasteride  (PROSCAR ) 5 MG tablet, Take 1 tablet (5 mg total) by mouth daily. For urine flow, Disp: 90 tablet, Rfl: 3   fluticasone  furoate-vilanterol (BREO ELLIPTA ) 200-25 MCG/ACT AEPB, Inhale 1 puff into the lungs daily., Disp: 1 each, Rfl: 11   Glucosamine 500 MG CAPS, Take by mouth., Disp: , Rfl:    moxifloxacin  (AVELOX ) 400 MG tablet, Take 1 tablet (400 mg total) by mouth daily., Disp: 10 tablet, Rfl: 0   omeprazole  (PRILOSEC) 20 MG capsule, TAKE 1 CAPSULE BY MOUTH EVERY DAY, Disp: 90 capsule, Rfl: 3   spironolactone (ALDACTONE) 25 MG tablet, Take 12.5 mg by mouth daily., Disp: , Rfl:    tamsulosin  (FLOMAX ) 0.4 MG CAPS capsule, Take 2 capsules (0.8 mg total) by mouth at bedtime. 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    Hypertension    Sleep apnea    does not use CPAP   Wears glasses     Tobacco Use: Social History   Tobacco Use  Smoking Status Former   Current packs/day: 0.00   Average packs/day: 0.5 packs/day for 45.0 years (22.5 ttl pk-yrs)   Types: Cigarettes   Start date: 01/12/1983   Quit date: 11/17/2016   Years since quitting: 6.9  Smokeless Tobacco Never    Labs: Review Flowsheet   More data exists      Latest Ref Rng & Units 04/18/2021 10/21/2021 04/22/2022 10/21/2022 04/21/2023  Labs for ITP Cardiac and Pulmonary Rehab  Cholestrol 100 - 199 mg/dL 892  889  880  882  895   LDL (calc) 0 - 99 mg/dL 49  45  49  44  44   HDL-C >39 mg/dL 44  54  58  61  47   Trlycerides 0 - 149 mg/dL 65  39  51  51  56      Exercise Target Goals: Exercise Program Goal: Individual exercise prescription set using results from initial 6 min walk test and THRR while considering  patient's activity barriers and safety.   Exercise Prescription Goal: Initial exercise prescription builds to 30-45 minutes a day of aerobic activity, 2-3 days per week.  Home exercise guidelines will be given to patient during program as part of exercise prescription that the participant will acknowledge.   Education: Aerobic Exercise: - Group verbal and visual presentation on the components of exercise prescription. Introduces F.I.T.T principle from ACSM for exercise prescriptions.  Reviews F.I.T.T. principles of aerobic exercise including progression. Written material provided at class time.   Education: Resistance Exercise: - Group verbal and visual presentation on the components of exercise prescription. Introduces F.I.T.T principle from ACSM for exercise prescriptions  Reviews F.I.T.T. principles of resistance exercise including progression. Written material provided at class time.    Education: Exercise & Equipment Safety: - Individual verbal instruction and demonstration of equipment use and safety with use of the equipment.   Education: Exercise Physiology & General Exercise Guidelines: - Group verbal and written instruction with models to review the exercise physiology of the cardiovascular system and associated critical values. Provides general exercise guidelines with specific guidelines to those with heart or lung disease. Written material provided at class time. Flowsheet Row PULMONARY REHAB OTHER  RESPIRATORY from 10/15/2023 in [facility]  Date 10/08/23  Educator hb  Instruction Review Code 2- Demonstrated Understanding    Education: Flexibility, Balance, Mind/Body Relaxation: - Group verbal and visual presentation with interactive activity on the components of exercise prescription. Introduces F.I.T.T principle from ACSM for exercise prescriptions. Reviews F.I.T.T. principles of flexibility and balance exercise training including progression. Also discusses the mind body connection.  Reviews various relaxation techniques to help reduce and manage stress (i.e. Deep breathing, progressive muscle relaxation, and visualization). Balance handout provided to take home. Written material provided at class time.   Activity Barriers & Risk Stratification:  Activity Barriers & Cardiac Risk Stratification - 11/03/23 1031       Activity Barriers & Cardiac Risk Stratification   Activity Barriers Shortness of Breath;Deconditioning;Muscular Weakness;Right Hip Replacement;Arthritis;Balance Concerns    Cardiac Risk Stratification Moderate          6 Minute Walk:  6 Minute Walk  Row Name 08/17/23 0939 11/03/23 1257       6 Minute Walk   Phase Initial Initial    Distance 1190 feet 1230 feet    Walk Time 6 minutes 6 minutes    # of Rest Breaks 0 0    MPH 2.25 2.33    METS 2.28 2.67    RPE 12 11    Perceived Dyspnea  1 --    VO2 Peak 7.96 9.36    Symptoms No No    Resting HR 81 bpm 67 bpm    Resting BP 90/60 144/62    Resting Oxygen Saturation  92 % 96 %    Exercise Oxygen Saturation  during 6 min walk 88 % 96 %    Max Ex. HR 92 bpm 96 bpm    Max Ex. BP 106/62 154/74    2 Minute Post BP 98/60 132/70      Interval HR   1 Minute HR 88 --    2 Minute HR 99 --    3 Minute HR 98 --    4 Minute HR 92 --    5 Minute HR 92 --    6 Minute HR 92 --    2 Minute Post HR 83 --    Interval Heart Rate? Yes --      Interval Oxygen   Interval Oxygen? Yes --     Baseline Oxygen Saturation % 92 % --    1 Minute Oxygen Saturation % 92 % --    1 Minute Liters of Oxygen 0 L --    2 Minute Oxygen Saturation % 90 % --    2 Minute Liters of Oxygen 0 L --    3 Minute Oxygen Saturation % 89 % --    3 Minute Liters of Oxygen 0 L --    4 Minute Oxygen Saturation % 88 % --    4 Minute Liters of Oxygen 0 L --    5 Minute Oxygen Saturation % 90 % --    5 Minute Liters of Oxygen 0 L --    6 Minute Oxygen Saturation % 89 % --    6 Minute Liters of Oxygen 0 L --    2 Minute Post Oxygen Saturation % 93 % --    2 Minute Post Liters of Oxygen 0 L --       Oxygen Initial Assessment:  Oxygen Initial Assessment - 08/17/23 1007       Home Oxygen   Home Oxygen Device None    Sleep Oxygen Prescription None      Initial 6 min Walk   Oxygen Used None      Program Oxygen Prescription   Program Oxygen Prescription None      Intervention   Short Term Goals To learn and understand importance of monitoring SPO2 with pulse oximeter and demonstrate accurate use of the pulse oximeter.;To learn and understand importance of maintaining oxygen saturations>88%;To learn and demonstrate proper pursed lip breathing techniques or other breathing techniques. ;To learn and demonstrate proper use of respiratory medications    Long  Term Goals Verbalizes importance of monitoring SPO2 with pulse oximeter and return demonstration;Maintenance of O2 saturations>88%;Exhibits proper breathing techniques, such as pursed lip breathing or other method taught during program session;Compliance with respiratory medication;Demonstrates proper use of MDI's          Oxygen Re-Evaluation:  Oxygen Re-Evaluation     Row Name 08/18/23 1034  Goals/Expected Outcomes   Comments Reviewed PLB technique with pt.  Talked about how it works and it's importance in maintaining their exercise saturations.       Goals/Expected Outcomes Short: Become more profiecient at using PLB.   Long:  Become independent at using PLB.          Oxygen Discharge (Final Oxygen Re-Evaluation):  Oxygen Re-Evaluation - 08/18/23 1034       Goals/Expected Outcomes   Comments Reviewed PLB technique with pt.  Talked about how it works and it's importance in maintaining their exercise saturations.    Goals/Expected Outcomes Short: Become more profiecient at using PLB.   Long: Become independent at using PLB.          Initial Exercise Prescription:  Initial Exercise Prescription - 11/03/23 1300       Date of Initial Exercise RX and Referring Provider   Date 11/03/23    Referring Provider Barnet Cough MD      Oxygen   Maintain Oxygen Saturation 88% or higher      Treadmill   MPH 2.2    Grade 1    Minutes 15    METs 2.99      REL-XR   Level 3    Speed 50    Minutes 15    METs 3      Prescription Details   Frequency (times per week) 2    Duration Progress to 30 minutes of continuous aerobic without signs/symptoms of physical distress      Intensity   THRR 40-80% of Max Heartrate 99-131    Ratings of Perceived Exertion 11-13    Perceived Dyspnea 0-4      Progression   Progression Continue to progress workloads to maintain intensity without signs/symptoms of physical distress.      Resistance Training   Training Prescription Yes    Weight 4 lb    Reps 10-15          Perform Capillary Blood Glucose checks as needed.  Exercise Prescription Changes:   Exercise Prescription Changes     Row Name 08/17/23 0900 09/08/23 1500 09/24/23 1500 10/21/23 1300 11/03/23 1300     Response to Exercise   Blood Pressure (Admit) 90/60 118/60 124/72 108/60 144/62   Blood Pressure (Exercise) 106/62 132/72 -- -- 152/74   Blood Pressure (Exit) 98/60 112/60 100/74 128/72 132/70   Heart Rate (Admit) 81 bpm 76 bpm 65 bpm 68 bpm 67 bpm   Heart Rate (Exercise) 92 bpm 86 bpm 80 bpm 84 bpm 96 bpm   Heart Rate (Exit) 83 bpm 78 bpm 74 bpm 78 bpm 71 bpm   Oxygen Saturation (Admit) 92 %  95 % 94 % 94 % 95 %   Oxygen Saturation (Exercise) 88 % 90 % 92 % 92 % 96 %   Oxygen Saturation (Exit) 93 % 94 % 93 % 94 % --   Rating of Perceived Exertion (Exercise) 12 13 12 11 11    Perceived Dyspnea (Exercise) 1 2 1 1  --   Symptoms -- -- -- -- none   Comments -- -- -- -- walk test results   Duration -- Continue with 30 min of aerobic exercise without signs/symptoms of physical distress. Continue with 30 min of aerobic exercise without signs/symptoms of physical distress. Continue with 30 min of aerobic exercise without signs/symptoms of physical distress. --   Intensity -- THRR unchanged THRR unchanged THRR unchanged --     Progression   Progression -- Continue to  progress workloads to maintain intensity without signs/symptoms of physical distress. Continue to progress workloads to maintain intensity without signs/symptoms of physical distress. Continue to progress workloads to maintain intensity without signs/symptoms of physical distress. --     Paramedic Prescription -- Yes Yes Yes --   Weight -- 4 4 4  --   Reps -- 10-15 10-15 10-15 --     Treadmill   MPH -- 2 2 2.7 --   Grade -- 1 0 0 --   Minutes -- 15 15 15  --   METs -- 2.81 2.53 3.07 --     NuStep   Level -- 4 5 5  --   SPM -- 85 81 94 --   Minutes -- 15 15 15  --   METs -- 2.5 2.6 3 --      Exercise Comments:   Exercise Comments     Row Name 08/18/23 1033           Exercise Comments First full day of exercise!  Patient was oriented to gym and equipment including functions, settings, policies, and procedures.  Patient's individual exercise prescription and treatment plan were reviewed.  All starting workloads were established based on the results of the 6 minute walk test done at initial orientation visit.  The plan for exercise progression was also introduced and progression will be customized based on patient's performance and goals.          Exercise Goals and Review:   Exercise Goals      Row Name 08/17/23 [phone]             Exercise Goals   Increase Physical Activity Yes       Intervention Provide advice, education, support and counseling about physical activity/exercise needs.;Develop an individualized exercise prescription for aerobic and resistive training based on initial evaluation findings, risk stratification, comorbidities and participant's personal goals.       Expected Outcomes Long Term: Add in home exercise to make exercise part of routine and to increase amount of physical activity.;Short Term: Attend rehab on a regular basis to increase amount of physical activity.;Long Term: Exercising regularly at least 3-5 days a week.       Increase Strength and Stamina Yes       Intervention Provide advice, education, support and counseling about physical activity/exercise needs.;Develop an individualized exercise prescription for aerobic and resistive training based on initial evaluation findings, risk stratification, comorbidities and participant's personal goals.       Expected Outcomes Short Term: Increase workloads from initial exercise prescription for resistance, speed, and METs.;Short Term: Perform resistance training exercises routinely during rehab and add in resistance training at home;Long Term: Improve cardiorespiratory fitness, muscular endurance and strength as measured by increased METs and functional capacity ( )       Able to understand and use rate of perceived exertion (RPE) scale Yes       Intervention Provide education and explanation on how to use RPE scale       Expected Outcomes Short Term: Able to use RPE daily in rehab to express subjective intensity level;Long Term:  Able to use RPE to guide intensity level when exercising independently       Able to understand and use Dyspnea scale Yes       Intervention Provide education and explanation on how to use Dyspnea scale       Expected Outcomes Short Term: Able to use Dyspnea scale daily in rehab to  express subjective sense  of shortness of breath during exertion;Long Term: Able to use Dyspnea scale to guide intensity level when exercising independently       Knowledge and understanding of Target Heart Rate Range (THRR) Yes       Intervention Provide education and explanation of THRR including how the numbers were predicted and where they are located for reference       Expected Outcomes Short Term: Able to state/look up THRR;Long Term: Able to use THRR to govern intensity when exercising independently;Short Term: Able to use daily as guideline for intensity in rehab       Able to check pulse independently Yes       Intervention Provide education and demonstration on how to check pulse in carotid and radial arteries.;Review the importance of being able to check your own pulse for safety during independent exercise       Expected Outcomes Short Term: Able to explain why pulse checking is important during independent exercise;Long Term: Able to check pulse independently and accurately       Understanding of Exercise Prescription Yes       Intervention Provide education, explanation, and written materials on patient's individual exercise prescription       Expected Outcomes Short Term: Able to explain program exercise prescription;Long Term: Able to explain home exercise prescription to exercise independently          Exercise Goals Re-Evaluation :  Exercise Goals Re-Evaluation     Row Name 08/18/23 1033 10/01/23 1303           Exercise Goal Re-Evaluation   Exercise Goals Review Knowledge and understanding of Target Heart Rate Range (THRR);Able to understand and use rate of perceived exertion (RPE) scale Increase Physical Activity;Increase Strength and Stamina;Understanding of Exercise Prescription      Comments Reviewed RPE and dyspnea scale, THR and program prescription with pt today.  Pt voiced understanding and was given a copy of goals to take home. Ron is doing well in rehab. He is  feeling good with exercise and is feeling an increase in his endurance. He is walking some at home due to heat and weather. He has 4lbs hand weights and resistance bands at home that he has been using. He is increasing his levels in rehab by walking speed on the treadmill and increasing level on the nustep. Will continue to monitor and progress as able.      Expected Outcomes Short: Use RPE daily to regulate intensity.  Long: Follow program prescription in THR. Short: increase hand weight lbs if able in the next week or two   long: continue to exercise at hoem and attend rehab         Discharge Exercise Prescription (Final Exercise Prescription Changes):  Exercise Prescription Changes - 11/03/23 1300       Response to Exercise   Blood Pressure (Admit) 144/62    Blood Pressure (Exercise) 152/74    Blood Pressure (Exit) 132/70    Heart Rate (Admit) 67 bpm    Heart Rate (Exercise) 96 bpm    Heart Rate (Exit) 71 bpm    Oxygen Saturation (Admit) 95 %    Oxygen Saturation (Exercise) 96 %    Rating of Perceived Exertion (Exercise) 11    Symptoms none    Comments walk test results          Nutrition:  Target Goals: Understanding of nutrition guidelines, daily intake of sodium 1500mg , cholesterol 200mg , calories 30% from fat and 7% or less from saturated  fats, daily to have 5 or more servings of fruits and vegetables.  Education: Nutrition 1 -Group instruction provided by verbal, written material, interactive activities, discussions, models, and posters to present general guidelines for heart healthy nutrition including macronutrients, label reading, and promoting whole foods over processed counterparts. Education serves as Pensions consultant of discussion of heart healthy eating for all. Written material provided at class time.    Education: Nutrition 2 -Group instruction provided by verbal, written material, interactive activities, discussions, models, and posters to present general guidelines  for heart healthy nutrition including sodium, cholesterol, and saturated fat. Providing guidance of habit forming to improve blood pressure, cholesterol, and body weight. Written material provided at class time.     Biometrics:  Pre Biometrics - 11/03/23 1316       Pre Biometrics   Height 5' 7 (1.702 m)    Weight 82.2 kg    Waist Circumference 38 inches    Hip Circumference 39 inches    Waist to Hip Ratio 0.97 %    BMI (Calculated) 28.39    Grip Strength 18.6 kg           Nutrition Therapy Plan and Nutrition Goals:  Nutrition Therapy & Goals - 11/03/23 1325       Intervention Plan   Intervention Prescribe, educate and counsel regarding individualized specific dietary modifications aiming towards targeted core components such as weight, hypertension, lipid management, diabetes, heart failure and other comorbidities.;Nutrition handout(s) given to patient.    Expected Outcomes Short Term Goal: Understand basic principles of dietary content, such as calories, fat, sodium, cholesterol and nutrients.;Long Term Goal: Adherence to prescribed nutrition plan.          Nutrition Assessments:  MEDIFICTS Score Key: >=70 Need to make dietary changes  40-70 Heart Healthy Diet <= 40 Therapeutic Level Cholesterol Diet  Flowsheet Row PULMONARY REHAB OTHER RESP ORIENTATION from 08/17/2023 in [facility]  Picture Your Plate Total Score on Admission 47   Picture Your Plate Scores: <59 Unhealthy dietary pattern with much room for improvement. 41-50 Dietary pattern unlikely to meet recommendations for good health and room for improvement. 51-60 More healthful dietary pattern, with some room for improvement.  >60 Healthy dietary pattern, although there may be some specific behaviors that could be improved.    Nutrition Goals Re-Evaluation:  Nutrition Goals Re-Evaluation     Row Name 10/01/23 1309             Goals   Nutrition Goal healthy eating        Comment Ron is doing well in rehab. He did not have a good apitie for a while but has finally got this back. He has gain a few lbs over the part month since getting his apitie back. His wife is ooking healthy options. Ron is a sweets lovers and does enjoy a nutty buddy or 2 a day. He is drinking mostly water, does not drink a lot of soads and only will have tea in a resturant       Expected Outcome Short: cut back on nutty buddies each day   long : continue to drnk water and pick healthy options          Nutrition Goals Discharge (Final Nutrition Goals Re-Evaluation):  Nutrition Goals Re-Evaluation - 10/01/23 1309       Goals   Nutrition Goal healthy eating    Comment Ron is doing well in rehab. He did not have a good apitie for a while but  has finally got this back. He has gain a few lbs over the part month since getting his apitie back. His wife is ooking healthy options. Ron is a sweets lovers and does enjoy a nutty buddy or 2 a day. He is drinking mostly water, does not drink a lot of soads and only will have tea in a resturant    Expected Outcome Short: cut back on nutty buddies each day   long : continue to drnk water and pick healthy options          Psychosocial: Target Goals: Acknowledge presence or absence of significant depression and/or stress, maximize coping skills, provide positive support system. Participant is able to verbalize types and ability to use techniques and skills needed for reducing stress and depression.   Education: Stress, Anxiety, and Depression - Group verbal and visual presentation to define topics covered.  Reviews how body is impacted by stress, anxiety, and depression.  Also discusses healthy ways to reduce stress and to treat/manage anxiety and depression. Written material provided at class time.   Education: Sleep Hygiene -Provides group verbal and written instruction about how sleep can affect your health.  Define sleep hygiene, discuss sleep cycles  and impact of sleep habits. Review good sleep hygiene tips.   Initial Review & Psychosocial Screening:  Initial Psych Review & Screening - 11/03/23 1320       Initial Review   Current issues with Current Sleep Concerns;Current Psychotropic Meds;Current Stress Concerns    Source of Stress Concerns Chronic Illness;Unable to participate in former interests or hobbies;Unable to perform yard/household activities    Comments new TAVR surgery and eager to return to rehab.      Family Dynamics   Good Support System? Yes   wife     Barriers   Psychosocial barriers to participate in program Psychosocial barriers identified (see note);The patient should benefit from training in stress management and relaxation.      Screening Interventions   Interventions Encouraged to exercise;To provide support and resources with identified psychosocial needs;Provide feedback about the scores to participant    Expected Outcomes Short Term goal: Utilizing psychosocial counselor, staff and physician to assist with identification of specific Stressors or current issues interfering with healing process. Setting desired goal for each stressor or current issue identified.;Long Term Goal: Stressors or current issues are controlled or eliminated.;Short Term goal: Identification and review with participant of any Quality of Life or Depression concerns found by scoring the questionnaire.;Long Term goal: The participant improves quality of Life and PHQ9 Scores as seen by post scores and/or verbalization of changes          Quality of Life Scores:   Scores of 19 and below usually indicate a poorer quality of life in these areas.  A difference of  2-3 points is a clinically meaningful difference.  A difference of 2-3 points in the total score of the Quality of Life Index has been associated with significant improvement in overall quality of life, self-image, physical symptoms, and general health in studies assessing change in  quality of life.  PHQ-9: Review Flowsheet  More data exists      11/03/2023 08/17/2023 04/21/2023 01/28/2023 10/21/2022  Depression screen PHQ 2/9  Decreased Interest 0 0 0 0 0  Down, Depressed, Hopeless 0 0 0 0 0  PHQ - 2 Score 0 0 0 0 0  Altered sleeping 0 3 0 - -  Tired, decreased energy 0 1 0 - -  Change in appetite  0 2 0 - -  Feeling bad or failure about yourself  0 0 0 - -  Trouble concentrating 0 0 0 - -  Moving slowly or fidgety/restless 0 0 0 - -  Suicidal thoughts 0 0 0 - -  PHQ-9 Score 0 6 0 - -  Difficult doing work/chores Somewhat difficult Somewhat difficult Not difficult at all - -   Interpretation of Total Score  Total Score Depression Severity:  1-4 = Minimal depression, 5-9 = Mild depression, 10-14 = Moderate depression, 15-19 = Moderately severe depression, 20-27 = Severe depression   Psychosocial Evaluation and Intervention:  Psychosocial Evaluation - 11/03/23 1321       Psychosocial Evaluation & Interventions   Interventions Stress management education;Relaxation education;Encouraged to exercise with the program and follow exercise prescription    Comments Ron is returning to cardiac rehab.  He was in pulmonary but had heart surgery and is now in cardiac rehab.  He attended today for orientation and we updated his information.  He is still on Lexapro  and feeling well managed.  He is sleeping little better than prior to surgery.  He is still not good with wearing his CPAP at night.  He is looking forward to getting back to rehab again as he noticed that it did make him feel better overall.    Expected Outcomes Short: Attend rehab to get moving again Long: Continue to stay positive    Continue Psychosocial Services  Follow up required by staff          Psychosocial Re-Evaluation:  Psychosocial Re-Evaluation     Row Name 10/01/23 1307             Psychosocial Re-Evaluation   Current issues with Current Sleep Concerns       Comments Ron is doing well in  rehab. He stated that he does have some trouble sleeping. He does have an elevated bed that sits hip up enough with his pillow that he is able to sleep well. He has a sleep study done about 10 years ago and they wanted him to use a cpap but he did not want to. He does get up some during the night to use thye restroom but that is it.       Expected Outcomes Short: montior sleep if it gets worse contact MD    long: continue to have no stressors       Interventions Encouraged to attend Pulmonary Rehabilitation for the exercise       Continue Psychosocial Services  Follow up required by staff          Psychosocial Discharge (Final Psychosocial Re-Evaluation):  Psychosocial Re-Evaluation - 10/01/23 1307       Psychosocial Re-Evaluation   Current issues with Current Sleep Concerns    Comments Ron is doing well in rehab. He stated that he does have some trouble sleeping. He does have an elevated bed that sits hip up enough with his pillow that he is able to sleep well. He has a sleep study done about 10 years ago and they wanted him to use a cpap but he did not want to. He does get up some during the night to use thye restroom but that is it.    Expected Outcomes Short: montior sleep if it gets worse contact MD    long: continue to have no stressors    Interventions Encouraged to attend Pulmonary Rehabilitation for the exercise    Continue Psychosocial Services  Follow up  required by staff          Vocational Rehabilitation: Provide vocational rehab assistance to qualifying candidates.   Vocational Rehab Evaluation & Intervention:  Vocational Rehab - 11/03/23 1326       Initial Vocational Rehab Evaluation & Intervention   Assessment shows need for Vocational Rehabilitation No   retired         Education: Education Goals: Education classes will be provided on a variety of topics geared toward better understanding of heart health and risk factor modification. Participant will state  understanding/return demonstration of topics presented as noted by education test scores.  Learning Barriers/Preferences:  Learning Barriers/Preferences - 11/03/23 1326       Learning Barriers/Preferences   Learning Barriers None    Learning Preferences Skilled Demonstration;Verbal Instruction          General Cardiac Education Topics:  AED/CPR: - Group verbal and written instruction with the use of models to demonstrate the basic use of the AED with the basic ABC's of resuscitation.   Test and Procedures: - Group verbal and visual presentation and models provide information about basic cardiac anatomy and function. Reviews the testing methods done to diagnose heart disease and the outcomes of the test results. Describes the treatment choices: Medical Management, Angioplasty, or Coronary Bypass Surgery for treating various heart conditions including Myocardial Infarction, Angina, Valve Disease, and Cardiac Arrhythmias. Written material provided at class time. Flowsheet Row PULMONARY REHAB OTHER RESPIRATORY from 10/15/2023 in [facility]  Date 10/15/23  Educator [facility]  Instruction Review Code 2- Demonstrated Understanding    Medication Safety: - Group verbal and visual instruction to review commonly prescribed medications for heart and lung disease. Reviews the medication, class of the drug, and side effects. Includes the steps to properly store meds and maintain the prescription regimen. Written material provided at class time.   Intimacy: - Group verbal instruction through game format to discuss how heart and lung disease can affect sexual intimacy. Written material provided at class time.   Know Your Numbers and Heart Failure: - Group verbal and visual instruction to discuss disease risk factors for cardiac and pulmonary disease and treatment options.  Reviews associated critical values for Overweight/Obesity, Hypertension, Cholesterol, and Diabetes.   Discusses basics of heart failure: signs/symptoms and treatments.  Introduces Heart Failure Zone chart for action plan for heart failure. Written material provided at class time.   Infection Prevention: - Provides verbal and written material to individual with discussion of infection control including proper hand washing and proper equipment cleaning during exercise session.   Falls Prevention: - Provides verbal and written material to individual with discussion of falls prevention and safety.   Other: -Provides group and verbal instruction on various topics (see comments)   Knowledge Questionnaire Score:  Knowledge Questionnaire Score - 08/17/23 1006       Knowledge Questionnaire Score   Pre Score 9/18          Core Components/Risk Factors/Patient Goals at Admission:  Personal Goals and Risk Factors at Admission - 11/03/23 1326       Core Components/Risk Factors/Patient Goals on Admission    Weight Management Weight Maintenance;Yes    Intervention Weight Management: Develop a combined nutrition and exercise program designed to reach desired caloric intake, while maintaining appropriate intake of nutrient and fiber, sodium and fats, and appropriate energy expenditure required for the weight goal.;Weight Management: Provide education and appropriate resources to help participant work on and attain dietary goals.    Admit  Weight 181 lb 4.8 oz (82.2 kg)    Goal Weight: Short Term 180 lb (81.6 kg)    Goal Weight: Long Term 180 lb (81.6 kg)    Expected Outcomes Short Term: Continue to assess and modify interventions until short term weight is achieved;Long Term: Adherence to nutrition and physical activity/exercise program aimed toward attainment of established weight goal;Weight Maintenance: Understanding of the daily nutrition guidelines, which includes 25-35% calories from fat, 7% or less cal from saturated fats, less than 200mg  cholesterol, less than 1.5gm of sodium, & 5 or more  servings of fruits and vegetables daily    Improve shortness of breath with ADL's Yes    Intervention Provide education, individualized exercise plan and daily activity instruction to help decrease symptoms of SOB with activities of daily living.    Expected Outcomes Short Term: Improve cardiorespiratory fitness to achieve a reduction of symptoms when performing ADLs;Long Term: Be able to perform more ADLs without symptoms or delay the onset of symptoms    Heart Failure Yes    Intervention Provide a combined exercise and nutrition program that is supplemented with education, support and counseling about heart failure. Directed toward relieving symptoms such as shortness of breath, decreased exercise tolerance, and extremity edema.    Expected Outcomes Improve functional capacity of life;Short term: Daily weights obtained and reported for increase. Utilizing diuretic protocols set by physician.;Long term: Adoption of self-care skills and reduction of barriers for early signs and symptoms recognition and intervention leading to self-care maintenance.;Short term: Attendance in program 2-3 days a week with increased exercise capacity. Reported lower sodium intake. Reported increased fruit and vegetable intake. Reports medication compliance.    Hypertension Yes    Intervention Provide education on lifestyle modifcations including regular physical activity/exercise, weight management, moderate sodium restriction and increased consumption of fresh fruit, vegetables, and low fat dairy, alcohol moderation, and smoking cessation.;Monitor prescription use compliance.    Expected Outcomes Short Term: Continued assessment and intervention until BP is < 140/62mm HG in hypertensive participants. < 130/23mm HG in hypertensive participants with diabetes, heart failure or chronic kidney disease.;Long Term: Maintenance of blood pressure at goal levels.    Lipids Yes    Intervention Provide education and support for  participant on nutrition & aerobic/resistive exercise along with prescribed medications to achieve LDL 70mg , HDL >40mg .    Expected Outcomes Short Term: Participant states understanding of desired cholesterol values and is compliant with medications prescribed. Participant is following exercise prescription and nutrition guidelines.;Long Term: Cholesterol controlled with medications as prescribed, with individualized exercise RX and with personalized nutrition plan. Value goals: LDL < 70mg , HDL > 40 mg.          Education:Diabetes - Individual verbal and written instruction to review signs/symptoms of diabetes, desired ranges of glucose level fasting, after meals and with exercise. Acknowledge that pre and post exercise glucose checks will be done for 3 sessions at entry of program.   Core Components/Risk Factors/Patient Goals Review:   Goals and Risk Factor Review     Row Name 10/01/23 1352             Core Components/Risk Factors/Patient Goals Review   Personal Goals Review Weight Management/Obesity;Improve shortness of breath with ADL's       Review Ron is doing well in rehab. He has gain a little weight the past month due to gaining his apitite back. He has no stess and has been working on his sleep.       Expected Outcomes  Short: monitor food and weight for managment  long: keep exercise for overall happienss          Core Components/Risk Factors/Patient Goals at Discharge (Final Review):   Goals and Risk Factor Review - 10/01/23 1352       Core Components/Risk Factors/Patient Goals Review   Personal Goals Review Weight Management/Obesity;Improve shortness of breath with ADL's    Review Ron is doing well in rehab. He has gain a little weight the past month due to gaining his apitite back. He has no stess and has been working on his sleep.    Expected Outcomes Short: monitor food and weight for managment  long: keep exercise for overall happienss          ITP Comments:   ITP Comments     Row Name 08/18/23 1033 09/09/23 0920 10/07/23 1147 11/02/23 0953 11/03/23 1247   ITP Comments First full day of exercise!  Patient was oriented to gym and equipment including functions, settings, policies, and procedures.  Patient's individual exercise prescription and treatment plan were reviewed.  All starting workloads were established based on the results of the 6 minute walk test done at initial orientation visit.  The plan for exercise progression was also introduced and progression will be customized based on patient's performance and goals. 30 day review completed. ITP sent to Dr.Jehanzeb Memon, Medical Director of  Pulmonary Rehab. Continue with ITP unless changes are made by physician.  New to program 30 day review completed. ITP sent to Dr.Jehanzeb Memon, Medical Director of  Pulmonary Rehab. Continue with ITP unless changes are made by physician. 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 Discharging from [facility]. Patient attend orientation today.  Patient is attending Cardiac Rehabilitation Program.  Documentation for diagnosis can be found in [facility] 10/22/23.  Reviewed medical chart, RPE/RPD, gym safety, and program guidelines.  Patient was fitted to equipment they will be using during rehab.  Patient is scheduled to start exercise on Thursday 11/05/23 at 1030.   Initial ITP created and sent for review and signature by Dr. Dorn Ross, Medical Director for Cardiac Rehabilitation Program.      Comments: Initial ITP

## 2023-11-04 ENCOUNTER — Encounter (HOSPITAL_COMMUNITY): Payer: Self-pay | Admitting: *Deleted

## 2023-11-04 DIAGNOSIS — Z952 Presence of prosthetic heart valve: Secondary | ICD-10-CM

## 2023-11-04 NOTE — Progress Notes (Signed)
 Cardiac Individual Treatment Plan  Patient Details  Name: Terry Pacheco MRN: [id number] Date of Birth: 01-31-1951 Referring Provider:   Flowsheet Row CARDIAC REHAB PHASE II ORIENTATION from 11/03/2023 in [facility]  Referring Provider Barnet Cough MD    Initial Encounter Date:  Flowsheet Row CARDIAC REHAB PHASE II ORIENTATION from 11/03/2023 in [facility]  Date 11/03/23    Visit Diagnosis: S/P TAVR (transcatheter aortic valve replacement)  Patient's Home Medications on Admission:  Current Outpatient Medications:    acetaminophen  (TYLENOL ) 500 MG tablet, Take 1,000 mg by mouth daily as needed for moderate pain or headache., Disp: , Rfl:    albuterol  (ACCUNEB ) 1.25 MG/3ML nebulizer solution, Take 3 mLs (1.25 mg total) by nebulization every 6 (six) hours as needed for wheezing., Disp: 75 mL, Rfl: 12   amLODipine  (NORVASC ) 10 MG tablet, TAKE 1 TABLET EVERY DAY FOR FOR BLOOD PRESSURE, Disp: 90 tablet, Rfl: 3   amoxicillin -clavulanate (AUGMENTIN ) 875-125 MG tablet, Take 1 tablet by mouth 2 (two) times daily. Take all of this medication, Disp: 20 tablet, Rfl: 0   aspirin  EC 81 MG tablet, Take 81 mg by mouth daily., Disp: , Rfl:    atorvastatin  (LIPITOR) 40 MG tablet, Take 1 tablet (40 mg total) by mouth daily., Disp: 90 tablet, Rfl: 3   B Complex CAPS, TAKE 1 CAPSULE BY MOUTH EVERY DAY, Disp: 100 capsule, Rfl: 2   calcium  carbonate (TUMS - DOSED IN MG ELEMENTAL CALCIUM ) 500 MG chewable tablet, Chew 2 tablets by mouth daily as needed for indigestion or heartburn., Disp: , Rfl:    empagliflozin (JARDIANCE) 10 MG TABS tablet, Take 1 tablet by mouth daily., Disp: , Rfl:    escitalopram  (LEXAPRO ) 20 MG tablet, TAKE 1 TABLET BY MOUTH EVERY DAY, Disp: 90 tablet, Rfl: 0   ezetimibe  (ZETIA ) 10 MG tablet, Take 1 tablet (10 mg total) by mouth daily., Disp: 90 tablet, Rfl: 0   fexofenadine -pseudoephedrine  (ALLEGRA-D 24) 180-240 MG 24 hr tablet, Take 1 tablet  by mouth every evening. For allergy and congestion, Disp: 30 tablet, Rfl: 11   finasteride  (PROSCAR ) 5 MG tablet, Take 1 tablet (5 mg total) by mouth daily. For urine flow, Disp: 90 tablet, Rfl: 3   fluticasone  furoate-vilanterol (BREO ELLIPTA ) 200-25 MCG/ACT AEPB, Inhale 1 puff into the lungs daily., Disp: 1 each, Rfl: 11   Glucosamine 500 MG CAPS, Take by mouth., Disp: , Rfl:    moxifloxacin  (AVELOX ) 400 MG tablet, Take 1 tablet (400 mg total) by mouth daily., Disp: 10 tablet, Rfl: 0   omeprazole  (PRILOSEC) 20 MG capsule, TAKE 1 CAPSULE BY MOUTH EVERY DAY, Disp: 90 capsule, Rfl: 3   spironolactone (ALDACTONE) 25 MG tablet, Take 12.5 mg by mouth daily., Disp: , Rfl:    tamsulosin  (FLOMAX ) 0.4 MG CAPS capsule, Take 2 capsules (0.8 mg total) by mouth at bedtime. 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    Hypertension    Sleep apnea    does not use CPAP   Wears glasses     Tobacco Use: Social History   Tobacco Use  Smoking Status Former   Current packs/day: 0.00   Average packs/day: 0.5 packs/day for 45.0 years (22.5 ttl pk-yrs)   Types: Cigarettes   Start date: 01/12/1983   Quit date: 11/17/2016   Years since quitting: 6.9  Smokeless Tobacco Never    Labs: Review Flowsheet   More data exists      Latest Ref Rng & Units 04/18/2021 10/21/2021 04/22/2022 10/21/2022 04/21/2023  Labs for ITP Cardiac and Pulmonary Rehab  Cholestrol 100 - 199 mg/dL 892  889  880  882  895   LDL (calc) 0 - 99 mg/dL 49  45  49  44  44   HDL-C >39 mg/dL 44  54  58  61  47   Trlycerides 0 - 149 mg/dL 65  39  51  51  56     Capillary Blood Glucose: Lab Results  Component Value Date   GLUCAP 93 12/08/2017   GLUCAP 106 (H) 04/29/2017   GLUCAP 105 (H) 12/02/2016     Exercise Target Goals: Exercise Program Goal: Individual exercise prescription set using results from initial 6 min walk test and THRR while considering  patient's activity barriers and safety.   Exercise Prescription Goal: Starting with aerobic activity 30 plus minutes a day, 3 days per week for initial exercise prescription. Provide home exercise prescription and guidelines that participant acknowledges understanding prior to discharge.  Activity Barriers & Risk Stratification:  Activity Barriers & Cardiac Risk Stratification - 11/03/23 1031       Activity Barriers & Cardiac Risk Stratification   Activity Barriers Shortness of Breath;Deconditioning;Muscular Weakness;Right Hip Replacement;Arthritis;Balance Concerns    Cardiac Risk Stratification Moderate          6 Minute Walk:  6 Minute Walk     Row Name 11/03/23 1257         6 Minute Walk   Phase Initial     Distance 1230 feet     Walk Time 6 minutes     # of Rest Breaks 0     MPH 2.33     METS 2.67     RPE 11     VO2 Peak 9.36     Symptoms No     Resting HR 67 bpm     Resting BP 144/62     Resting Oxygen Saturation  96 %     Exercise Oxygen Saturation  during 6 min walk 96 %     Max Ex. HR 96 bpm     Max Ex. BP 154/74     2 Minute Post BP 132/70        Oxygen Initial Assessment:   Oxygen Re-Evaluation:   Oxygen Discharge (Final Oxygen Re-Evaluation):   Initial Exercise Prescription:  Initial Exercise Prescription - 11/03/23 1300        Date of Initial Exercise RX and Referring Provider   Date 11/03/23    Referring Provider Barnet Cough MD      Oxygen   Maintain Oxygen Saturation 88% or higher      Treadmill   MPH 2.2    Grade 1    Minutes 15  METs 2.99      REL-XR   Level 3    Speed 50    Minutes 15    METs 3      Prescription Details   Frequency (times per week) 2    Duration Progress to 30 minutes of continuous aerobic without signs/symptoms of physical distress      Intensity   THRR 40-80% of Max Heartrate 99-131    Ratings of Perceived Exertion 11-13    Perceived Dyspnea 0-4      Progression   Progression Continue to progress workloads to maintain intensity without signs/symptoms of physical distress.      Resistance Training   Training Prescription Yes    Weight 4 lb    Reps 10-15          Perform Capillary Blood Glucose checks as needed.  Exercise Prescription Changes:   Exercise Prescription Changes     Row Name 09/08/23 1500 09/24/23 1500 10/21/23 1300 11/03/23 1300       Response to Exercise   Blood Pressure (Admit) 118/60 124/72 108/60 144/62    Blood Pressure (Exercise) 132/72 -- -- 152/74    Blood Pressure (Exit) 112/60 100/74 128/72 132/70    Heart Rate (Admit) 76 bpm 65 bpm 68 bpm 67 bpm    Heart Rate (Exercise) 86 bpm 80 bpm 84 bpm 96 bpm    Heart Rate (Exit) 78 bpm 74 bpm 78 bpm 71 bpm    Oxygen Saturation (Admit) 95 % 94 % 94 % 95 %    Oxygen Saturation (Exercise) 90 % 92 % 92 % 96 %    Oxygen Saturation (Exit) 94 % 93 % 94 % --    Rating of Perceived Exertion (Exercise) 13 12 11 11     Perceived Dyspnea (Exercise) 2 1 1  --    Symptoms -- -- -- none    Comments -- -- -- walk test results    Duration Continue with 30 min of aerobic exercise without signs/symptoms of physical distress. Continue with 30 min of aerobic exercise without signs/symptoms of physical distress. Continue with 30 min of aerobic exercise without signs/symptoms of physical distress. --     Intensity THRR unchanged THRR unchanged THRR unchanged --      Progression   Progression Continue to progress workloads to maintain intensity without signs/symptoms of physical distress. Continue to progress workloads to maintain intensity without signs/symptoms of physical distress. Continue to progress workloads to maintain intensity without signs/symptoms of physical distress. --      Paramedic Prescription Yes Yes Yes --    Weight 4 4 4  --    Reps 10-15 10-15 10-15 --      Treadmill   MPH 2 2 2.7 --    Grade 1 0 0 --    Minutes 15 15 15  --    METs 2.81 2.53 3.07 --      NuStep   Level 4 5 5  --    SPM 85 81 94 --    Minutes 15 15 15  --    METs 2.5 2.6 3 --       Exercise Comments:   Exercise Goals and Review:   Exercise Goals Re-Evaluation :  Exercise Goals Re-Evaluation     Row Name 10/01/23 1303             Exercise Goal Re-Evaluation   Exercise Goals Review Increase Physical Activity;Increase Strength and Stamina;Understanding of Exercise Prescription  Comments Terry Pacheco is doing well in rehab. He is feeling good with exercise and is feeling an increase in his endurance. He is walking some at home due to heat and weather. He has 4lbs hand weights and resistance bands at home that he has been using. He is increasing his levels in rehab by walking speed on the treadmill and increasing level on the nustep. Will continue to monitor and progress as able.       Expected Outcomes Short: increase hand weight lbs if able in the next week or two   long: continue to exercise at hoem and attend rehab           Discharge Exercise Prescription (Final Exercise Prescription Changes):  Exercise Prescription Changes - 11/03/23 1300       Response to Exercise   Blood Pressure (Admit) 144/62    Blood Pressure (Exercise) 152/74    Blood Pressure (Exit) 132/70    Heart Rate (Admit) 67 bpm    Heart Rate (Exercise) 96 bpm    Heart Rate (Exit) 71 bpm     Oxygen Saturation (Admit) 95 %    Oxygen Saturation (Exercise) 96 %    Rating of Perceived Exertion (Exercise) 11    Symptoms none    Comments walk test results          Nutrition:  Target Goals: Understanding of nutrition guidelines, daily intake of sodium 1500mg , cholesterol 200mg , calories 30% from fat and 7% or less from saturated fats, daily to have 5 or more servings of fruits and vegetables.  Biometrics:  Pre Biometrics - 11/03/23 1316       Pre Biometrics   Height 5' 7 (1.702 m)    Weight 181 lb 4.8 oz (82.2 kg)    Waist Circumference 38 inches    Hip Circumference 39 inches    Waist to Hip Ratio 0.97 %    BMI (Calculated) 28.39    Grip Strength 18.6 kg           Nutrition Therapy Plan and Nutrition Goals:  Nutrition Therapy & Goals - 11/03/23 1325       Intervention Plan   Intervention Prescribe, educate and counsel regarding individualized specific dietary modifications aiming towards targeted core components such as weight, hypertension, lipid management, diabetes, heart failure and other comorbidities.;Nutrition handout(s) given to patient.    Expected Outcomes Short Term Goal: Understand basic principles of dietary content, such as calories, fat, sodium, cholesterol and nutrients.;Long Term Goal: Adherence to prescribed nutrition plan.          Nutrition Assessments:  MEDIFICTS Score Key: >=70 Need to make dietary changes  40-70 Heart Healthy Diet <= 40 Therapeutic Level Cholesterol Diet  Flowsheet Row CARDIAC REHAB PHASE II ORIENTATION from 11/03/2023 in [facility]  Picture Your Plate Total Score on Admission 68   Picture Your Plate Scores: <59 Unhealthy dietary pattern with much room for improvement. 41-50 Dietary pattern unlikely to meet recommendations for good health and room for improvement. 51-60 More healthful dietary pattern, with some room for improvement.  >60 Healthy dietary pattern, although there may be some  specific behaviors that could be improved.    Nutrition Goals Re-Evaluation:  Nutrition Goals Re-Evaluation     Row Name 10/01/23 1309             Goals   Nutrition Goal healthy eating       Comment Terry Pacheco is doing well in rehab. He did not have a good apitie for  a while but has finally got this back. He has gain a few lbs over the part month since getting his apitie back. His wife is ooking healthy options. Terry Pacheco is a sweets lovers and does enjoy a nutty buddy or 2 a day. He is drinking mostly water, does not drink a lot of soads and only will have tea in a resturant       Expected Outcome Short: cut back on nutty buddies each day   long : continue to drnk water and pick healthy options          Nutrition Goals Discharge (Final Nutrition Goals Re-Evaluation):  Nutrition Goals Re-Evaluation - 10/01/23 1309       Goals   Nutrition Goal healthy eating    Comment Terry Pacheco is doing well in rehab. He did not have a good apitie for a while but has finally got this back. He has gain a few lbs over the part month since getting his apitie back. His wife is ooking healthy options. Terry Pacheco is a sweets lovers and does enjoy a nutty buddy or 2 a day. He is drinking mostly water, does not drink a lot of soads and only will have tea in a resturant    Expected Outcome Short: cut back on nutty buddies each day   long : continue to drnk water and pick healthy options          Psychosocial: Target Goals: Acknowledge presence or absence of significant depression and/or stress, maximize coping skills, provide positive support system. Participant is able to verbalize types and ability to use techniques and skills needed for reducing stress and depression.  Initial Review & Psychosocial Screening:  Initial Psych Review & Screening - 11/03/23 1320       Initial Review   Current issues with Current Sleep Concerns;Current Psychotropic Meds;Current Stress Concerns    Source of Stress Concerns Chronic Illness;Unable  to participate in former interests or hobbies;Unable to perform yard/household activities    Comments new TAVR surgery and eager to return to rehab.      Family Dynamics   Good Support System? Yes   wife     Barriers   Psychosocial barriers to participate in program Psychosocial barriers identified (see note);The patient should benefit from training in stress management and relaxation.      Screening Interventions   Interventions Encouraged to exercise;To provide support and resources with identified psychosocial needs;Provide feedback about the scores to participant    Expected Outcomes Short Term goal: Utilizing psychosocial counselor, staff and physician to assist with identification of specific Stressors or current issues interfering with healing process. Setting desired goal for each stressor or current issue identified.;Long Term Goal: Stressors or current issues are controlled or eliminated.;Short Term goal: Identification and review with participant of any Quality of Life or Depression concerns found by scoring the questionnaire.;Long Term goal: The participant improves quality of Life and PHQ9 Scores as seen by post scores and/or verbalization of changes          Quality of Life Scores:  Scores of 19 and below usually indicate a poorer quality of life in these areas.  A difference of  2-3 points is a clinically meaningful difference.  A difference of 2-3 points in the total score of the Quality of Life Index has been associated with significant improvement in overall quality of life, self-image, physical symptoms, and general health in studies assessing change in quality of life.  PHQ-9: Review Flowsheet  More data exists  11/03/2023 08/17/2023 04/21/2023 01/28/2023 10/21/2022  Depression screen PHQ 2/9  Decreased Interest 0 0 0 0 0  Down, Depressed, Hopeless 0 0 0 0 0  PHQ - 2 Score 0 0 0 0 0  Altered sleeping 0 3 0 - -  Tired, decreased energy 0 1 0 - -  Change in appetite 0  2 0 - -  Feeling bad or failure about yourself  0 0 0 - -  Trouble concentrating 0 0 0 - -  Moving slowly or fidgety/restless 0 0 0 - -  Suicidal thoughts 0 0 0 - -  PHQ-9 Score 0 6 0 - -  Difficult doing work/chores Somewhat difficult Somewhat difficult Not difficult at all - -   Interpretation of Total Score  Total Score Depression Severity:  1-4 = Minimal depression, 5-9 = Mild depression, 10-14 = Moderate depression, 15-19 = Moderately severe depression, 20-27 = Severe depression   Psychosocial Evaluation and Intervention:  Psychosocial Evaluation - 11/03/23 1321       Psychosocial Evaluation & Interventions   Interventions Stress management education;Relaxation education;Encouraged to exercise with the program and follow exercise prescription    Comments Terry Pacheco is returning to cardiac rehab.  He was in pulmonary but had heart surgery and is now in cardiac rehab.  He attended today for orientation and we updated his information.  He is still on Lexapro  and feeling well managed.  He is sleeping little better than prior to surgery.  He is still not good with wearing his CPAP at night.  He is looking forward to getting back to rehab again as he noticed that it did make him feel better overall.    Expected Outcomes Short: Attend rehab to get moving again Long: Continue to stay positive    Continue Psychosocial Services  Follow up required by staff          Psychosocial Re-Evaluation:  Psychosocial Re-Evaluation     Row Name 10/01/23 1307             Psychosocial Re-Evaluation   Current issues with Current Sleep Concerns       Comments Terry Pacheco is doing well in rehab. He stated that he does have some trouble sleeping. He does have an elevated bed that sits hip up enough with his pillow that he is able to sleep well. He has a sleep study done about 10 years ago and they wanted him to use a cpap but he did not want to. He does get up some during the night to use thye restroom but that is  it.       Expected Outcomes Short: montior sleep if it gets worse contact MD    long: continue to have no stressors       Interventions Encouraged to attend Pulmonary Rehabilitation for the exercise       Continue Psychosocial Services  Follow up required by staff          Psychosocial Discharge (Final Psychosocial Re-Evaluation):  Psychosocial Re-Evaluation - 10/01/23 1307       Psychosocial Re-Evaluation   Current issues with Current Sleep Concerns    Comments Terry Pacheco is doing well in rehab. He stated that he does have some trouble sleeping. He does have an elevated bed that sits hip up enough with his pillow that he is able to sleep well. He has a sleep study done about 10 years ago and they wanted him to use a cpap but he did not want to. He  does get up some during the night to use thye restroom but that is it.    Expected Outcomes Short: montior sleep if it gets worse contact MD    long: continue to have no stressors    Interventions Encouraged to attend Pulmonary Rehabilitation for the exercise    Continue Psychosocial Services  Follow up required by staff          Vocational Rehabilitation: Provide vocational rehab assistance to qualifying candidates.   Vocational Rehab Evaluation & Intervention:  Vocational Rehab - 11/03/23 1326       Initial Vocational Rehab Evaluation & Intervention   Assessment shows need for Vocational Rehabilitation No   retired         Education: Education Goals: Education classes will be provided on a weekly basis, covering required topics. Participant will state understanding/return demonstration of topics presented.  Learning Barriers/Preferences:  Learning Barriers/Preferences - 11/03/23 1326       Learning Barriers/Preferences   Learning Barriers None    Learning Preferences Skilled Demonstration;Verbal Instruction          Education Topics: Hypertension, Hypertension Reduction -Define heart disease and high blood pressure. Discus  how high blood pressure affects the body and ways to reduce high blood pressure.   Exercise and Your Heart -Discuss why it is important to exercise, the FITT principles of exercise, normal and abnormal responses to exercise, and how to exercise safely.   Angina -Discuss definition of angina, causes of angina, treatment of angina, and how to decrease risk of having angina.   Cardiac Medications -Review what the following cardiac medications are used for, how they affect the body, and side effects that may occur when taking the medications.  Medications include Aspirin , Beta blockers, calcium  channel blockers, ACE Inhibitors, angiotensin receptor blockers, diuretics, digoxin, and antihyperlipidemics. Flowsheet Row PULMONARY REHAB OTHER RESPIRATORY from 10/01/2023 in [facility] [facility]  Date 09/24/23  Educator [facility]  Instruction Review Code 1- Verbalizes Understanding    Congestive Heart Failure -Discuss the definition of CHF, how to live with CHF, the signs and symptoms of CHF, and how keep track of weight and sodium intake.   Heart Disease and Intimacy -Discus the effect sexual activity has on the heart, how changes occur during intimacy as we age, and safety during sexual activity. Flowsheet Row PULMONARY REHAB OTHER RESPIRATORY from 10/01/2023 in [place] [facility]  Date 08/27/23  Educator [facility]  Instruction Review Code 1- Verbalizes Understanding    Smoking Cessation / COPD -Discuss different methods to quit smoking, the health benefits of quitting smoking, and the definition of COPD.   Nutrition I: Fats -Discuss the types of cholesterol, what cholesterol does to the heart, and how cholesterol levels can be controlled.   Nutrition II: Labels -Discuss the different components of food labels and how to read food label   Heart Parts/Heart Disease and PAD -Discuss the anatomy of the heart, the pathway of blood circulation through the heart, and these are  affected by heart disease.   Stress I: Signs and Symptoms -Discuss the causes of stress, how stress may lead to anxiety and depression, and ways to limit stress. Flowsheet Row PULMONARY REHAB OTHER RESPIRATORY from 10/01/2023 in [place] [facility]  Date 09/10/23  Educator [facility]  Instruction Review Code 1- Verbalizes Understanding    Stress II: Relaxation -Discuss different types of relaxation techniques to limit stress.   Warning Signs of Stroke / TIA -Discuss definition of a stroke, what the signs and symptoms  are of a stroke, and how to identify when someone is having stroke.   Knowledge Questionnaire Score:   Core Components/Risk Factors/Patient Goals at Admission:  Personal Goals and Risk Factors at Admission - 11/03/23 1326       Core Components/Risk Factors/Patient Goals on Admission    Weight Management Weight Maintenance;Yes    Intervention Weight Management: Develop a combined nutrition and exercise program designed to reach desired caloric intake, while maintaining appropriate intake of nutrient and fiber, sodium and fats, and appropriate energy expenditure required for the weight goal.;Weight Management: Provide education and appropriate resources to help participant work on and attain dietary goals.    Admit Weight 181 lb 4.8 oz (82.2 kg)    Goal Weight: Short Term 180 lb (81.6 kg)    Goal Weight: Long Term 180 lb (81.6 kg)    Expected Outcomes Short Term: Continue to assess and modify interventions until short term weight is achieved;Long Term: Adherence to nutrition and physical activity/exercise program aimed toward attainment of established weight goal;Weight Maintenance: Understanding of the daily nutrition guidelines, which includes 25-35% calories from fat, 7% or less cal from saturated fats, less than 200mg  cholesterol, less than 1.5gm of sodium, & 5 or more servings of fruits and vegetables daily    Improve shortness of breath with ADL's Yes     Intervention Provide education, individualized exercise plan and daily activity instruction to help decrease symptoms of SOB with activities of daily living.    Expected Outcomes Short Term: Improve cardiorespiratory fitness to achieve a reduction of symptoms when performing ADLs;Long Term: Be able to perform more ADLs without symptoms or delay the onset of symptoms    Heart Failure Yes    Intervention Provide a combined exercise and nutrition program that is supplemented with education, support and counseling about heart failure. Directed toward relieving symptoms such as shortness of breath, decreased exercise tolerance, and extremity edema.    Expected Outcomes Improve functional capacity of life;Short term: Daily weights obtained and reported for increase. Utilizing diuretic protocols set by physician.;Long term: Adoption of self-care skills and reduction of barriers for early signs and symptoms recognition and intervention leading to self-care maintenance.;Short term: Attendance in program 2-3 days a week with increased exercise capacity. Reported lower sodium intake. Reported increased fruit and vegetable intake. Reports medication compliance.    Hypertension Yes    Intervention Provide education on lifestyle modifcations including regular physical activity/exercise, weight management, moderate sodium restriction and increased consumption of fresh fruit, vegetables, and low fat dairy, alcohol moderation, and smoking cessation.;Monitor prescription use compliance.    Expected Outcomes Short Term: Continued assessment and intervention until BP is < 140/28mm HG in hypertensive participants. < 130/39mm HG in hypertensive participants with diabetes, heart failure or chronic kidney disease.;Long Term: Maintenance of blood pressure at goal levels.    Lipids Yes    Intervention Provide education and support for participant on nutrition & aerobic/resistive exercise along with prescribed medications to achieve  LDL 70mg , HDL >40mg .    Expected Outcomes Short Term: Participant states understanding of desired cholesterol values and is compliant with medications prescribed. Participant is following exercise prescription and nutrition guidelines.;Long Term: Cholesterol controlled with medications as prescribed, with individualized exercise RX and with personalized nutrition plan. Value goals: LDL < 70mg , HDL > 40 mg.          Core Components/Risk Factors/Patient Goals Review:   Goals and Risk Factor Review     Row Name 10/01/23 1352  Core Components/Risk Factors/Patient Goals Review   Personal Goals Review Weight Management/Obesity;Improve shortness of breath with ADL's       Review Terry Pacheco is doing well in rehab. He has gain a little weight the past month due to gaining his apitite back. He has no stess and has been working on his sleep.       Expected Outcomes Short: monitor food and weight for managment  long: keep exercise for overall happienss          Core Components/Risk Factors/Patient Goals at Discharge (Final Review):   Goals and Risk Factor Review - 10/01/23 1352       Core Components/Risk Factors/Patient Goals Review   Personal Goals Review Weight Management/Obesity;Improve shortness of breath with ADL's    Review Terry Pacheco is doing well in rehab. He has gain a little weight the past month due to gaining his apitite back. He has no stess and has been working on his sleep.    Expected Outcomes Short: monitor food and weight for managment  long: keep exercise for overall happienss          ITP Comments:  ITP Comments     Row Name 09/09/23 0920 10/07/23 1147 11/02/23 0953 11/03/23 1247 11/04/23 1503   ITP Comments 30 day review completed. ITP sent to Dr.Jehanzeb Memon, Medical Director of  Pulmonary Rehab. Continue with ITP unless changes are made by physician.  New to program 30 day review completed. ITP sent to Dr.Jehanzeb Memon, Medical Director of  Pulmonary Rehab.  Continue with ITP unless changes are made by physician. 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 Discharging from [facility]. Patient attend orientation today.  Patient is attending Cardiac Rehabilitation Program.  Documentation for diagnosis can be found in [facility] 10/22/23.  Reviewed medical chart, RPE/RPD, gym safety, and program guidelines.  Patient was fitted to equipment they will be using during rehab.  Patient is scheduled to start exercise on Thursday 11/05/23 at 1030.   Initial ITP created and sent for review and signature by Dr. Dorn Ross, Medical Director for Cardiac Rehabilitation Program. 30 day review completed. ITP sent to Dr. Dorn Ross, Medical Director of Cardiac Rehab. Continue with ITP unless changes are made by physician.  He just oriented for cardiac rehab yesterday.      Comments: 30 day review

## 2023-11-05 ENCOUNTER — Encounter (HOSPITAL_COMMUNITY)

## 2023-11-05 ENCOUNTER — Encounter (HOSPITAL_COMMUNITY)
Admission: RE | Admit: 2023-11-05 | Discharge: 2023-11-05 | Disposition: A | Discharge status: Home / Self Care | Source: Ambulatory Visit | Attending: Internal Medicine | Admitting: Internal Medicine

## 2023-11-05 DIAGNOSIS — Z952 Presence of prosthetic heart valve: Secondary | ICD-10-CM | POA: Diagnosis not present

## 2023-11-05 DIAGNOSIS — I5022 Chronic systolic (congestive) heart failure: Secondary | ICD-10-CM

## 2023-11-05 NOTE — Progress Notes (Signed)
 Daily Session Note  Patient Details  Name: Terry Pacheco MRN: [id number] Date of Birth: 22-Jul-1950 Referring Provider:   Flowsheet Row CARDIAC REHAB PHASE II ORIENTATION from 11/03/2023 in [facility]AC RE[facility]ILIT[facility]  Referring Provider Barnet Cough MD    Encounter Date: 11/05/2023  Check In:  Session Check In - 11/05/23 1027       Check-In   Supervising physician immediately available to respond to emergencies See telemetry face sheet for immediately available MD    Location [facility]    Staff Present Powell Benders, BS, Exercise Physiologist;Victoria Zina, RN;Jessica Higganum, MA, RCEP, CCRP, CCET    Virtual Visit No    Medication changes reported     No    Fall or balance concerns reported    No    Tobacco Cessation No Change    Warm-up and Cool-down Performed on first and last piece of equipment    Resistance Training Performed Yes    VAD Patient? No    PAD/SET Patient? No      Pain Assessment   Currently in Pain? No/denies    Multiple Pain Sites No          Capillary Blood Glucose: No results found for this or any previous visit (from the past 24 hours).    Social History   Tobacco Use  Smoking Status Former   Current packs/day: 0.00   Average packs/day: 0.5 packs/day for 45.0 years (22.5 ttl pk-yrs)   Types: Cigarettes   Start date: 01/12/1983   Quit date: 11/17/2016   Years since quitting: 6.9  Smokeless Tobacco Never    Goals Met:  Independence with exercise equipment Exercise tolerated well No report of concerns or symptoms today Strength training completed today  Goals Unmet:  Not Applicable  Comments: First full day of exercise!  Patient was oriented to gym and equipment including functions, settings, policies, and procedures.  Patient's individual exercise prescription and treatment plan were reviewed.  All starting workloads were established based on the results of the 6 minute walk test done at initial orientation  visit.  The plan for exercise progression was also introduced and progression will be customized based on patient's performance and goals.

## 2023-11-06 ENCOUNTER — Ambulatory Visit: Admitting: Internal Medicine

## 2023-11-06 DIAGNOSIS — J449 Chronic obstructive pulmonary disease, unspecified: Secondary | ICD-10-CM | POA: Diagnosis not present

## 2023-11-06 LAB — PULMONARY FUNCTION TEST
DL/VA % pred: 76 %
DL/VA: 3.1 ml/min/mmHg/L
DLCO cor % pred: 75 %
DLCO cor: 17.3 ml/min/mmHg
DLCO unc % pred: 75 %
DLCO unc: 17.15 ml/min/mmHg
FEF 25-75 Post: 1.39 L/s
FEF 25-75 Pre: 1.18 L/s
FEF2575-%Change-Post: 17 %
FEF2575-%Pred-Post: 69 %
FEF2575-%Pred-Pre: 58 %
FEV1-%Change-Post: 2 %
FEV1-%Pred-Post: 83 %
FEV1-%Pred-Pre: 80 %
FEV1-Post: 2.25 L
FEV1-Pre: 2.19 L
FEV1FVC-%Change-Post: 0 %
FEV1FVC-%Pred-Pre: 89 %
FEV6-%Change-Post: 3 %
FEV6-%Pred-Post: 98 %
FEV6-%Pred-Pre: 94 %
FEV6-Post: 3.43 L
FEV6-Pre: 3.31 L
FEV6FVC-%Change-Post: 0 %
FEV6FVC-%Pred-Post: 105 %
FEV6FVC-%Pred-Pre: 105 %
FVC-%Change-Post: 3 %
FVC-%Pred-Post: 93 %
FVC-%Pred-Pre: 89 %
FVC-Post: 3.49 L
FVC-Pre: 3.36 L
Post FEV1/FVC ratio: 64 %
Post FEV6/FVC ratio: 98 %
Pre FEV1/FVC ratio: 65 %
Pre FEV6/FVC Ratio: 98 %
RV % pred: 246 %
RV: 5.72 L
TLC % pred: 147 %
TLC: 9.34 L

## 2023-11-06 NOTE — Progress Notes (Signed)
 Full PFT performed today.

## 2023-11-06 NOTE — Patient Instructions (Signed)
 Full PFT performed today.

## 2023-11-08 ENCOUNTER — Other Ambulatory Visit: Payer: Self-pay | Admitting: Family Medicine

## 2023-11-08 DIAGNOSIS — K219 Gastro-esophageal reflux disease without esophagitis: Secondary | ICD-10-CM

## 2023-11-10 ENCOUNTER — Encounter: Payer: Self-pay | Admitting: Gastroenterology

## 2023-11-10 ENCOUNTER — Encounter (HOSPITAL_COMMUNITY)

## 2023-11-10 ENCOUNTER — Encounter (HOSPITAL_COMMUNITY)
Admission: RE | Admit: 2023-11-10 | Discharge: 2023-11-10 | Disposition: A | Discharge status: Home / Self Care | Source: Ambulatory Visit | Attending: Internal Medicine

## 2023-11-10 ENCOUNTER — Telehealth: Payer: Self-pay | Admitting: *Deleted

## 2023-11-10 DIAGNOSIS — I5022 Chronic systolic (congestive) heart failure: Secondary | ICD-10-CM

## 2023-11-10 DIAGNOSIS — Z952 Presence of prosthetic heart valve: Secondary | ICD-10-CM | POA: Diagnosis not present

## 2023-11-10 NOTE — Progress Notes (Signed)
 Daily Session Note  Patient Details  Name: Terry Pacheco MRN: [id number] Date of Birth: Aug 01, 1950 Referring Provider:   Flowsheet Row CARDIAC REHAB PHASE II ORIENTATION from 11/03/2023 in [facility] CARDIAC REHABILITATION  Referring Provider Barnet Cough MD    Encounter Date: 11/10/2023  Check In:  Session Check In - 11/10/23 1017       Check-In   Supervising physician immediately available to respond to emergencies See telemetry face sheet for immediately available MD    Location [facility]    Staff Present Laymon Rattler, BSN, RN, WTA-C;Mary Idell Glen, RN, BSN, MA;Heather Con, BS, Exercise Physiologist    Virtual Visit No    Medication changes reported     No    Fall or balance concerns reported    No    Tobacco Cessation No Change    Warm-up and Cool-down Performed on first and last piece of equipment    Resistance Training Performed Yes    VAD Patient? No    PAD/SET Patient? No      Pain Assessment   Currently in Pain? No/denies          Capillary Blood Glucose: No results found for this or any previous visit (from the past 24 hours).    Social History   Tobacco Use  Smoking Status Former   Current packs/day: 0.00   Average packs/day: 0.5 packs/day for 45.0 years (22.5 ttl pk-yrs)   Types: Cigarettes   Start date: 01/12/1983   Quit date: 11/17/2016   Years since quitting: 6.9  Smokeless Tobacco Never    Goals Met:  Independence with exercise equipment Exercise tolerated well No report of concerns or symptoms today Strength training completed today  Goals Unmet:  Not Applicable  Comments: Pt able to follow exercise prescription today without complaint.  Will continue to monitor for progression.

## 2023-11-10 NOTE — Telephone Encounter (Signed)
 Lebron DELENA Sharps, CMA 11/09/2023 11:09 AM EDT Back to Top    Lm x1 for patient.    Ozell KATHEE America, MD 11/08/2023  7:47 AM EDT     Call patient :  Study isc/w very mild COPD, main rec is stop smoking    Be sure patient has/keeps f/u ov so we can go over all the details of this study and get a plan together moving forward - ok to move up f/u if not feeling better and wants to be seen sooner     Results given Appt scheduled with MW 9/29/ @ 10:30

## 2023-11-12 ENCOUNTER — Encounter (HOSPITAL_COMMUNITY)

## 2023-11-12 ENCOUNTER — Encounter (HOSPITAL_COMMUNITY)
Admission: RE | Admit: 2023-11-12 | Discharge: 2023-11-12 | Disposition: A | Discharge status: Home / Self Care | Source: Ambulatory Visit | Attending: Internal Medicine | Admitting: Internal Medicine

## 2023-11-12 DIAGNOSIS — Z952 Presence of prosthetic heart valve: Secondary | ICD-10-CM | POA: Diagnosis not present

## 2023-11-12 NOTE — Progress Notes (Signed)
 Daily Session Note  Patient Details  Name: Terry Pacheco MRN: [id number] Date of Birth: 1950-05-03 Referring Provider:   Flowsheet Row CARDIAC REHAB PHASE II ORIENTATION from 11/03/2023 in [facility] CARDIAC REHABILITATION  Referring Provider Barnet Cough MD    Encounter Date: 11/12/2023  Check In:  Session Check In - 11/12/23 1015       Check-In   Supervising physician immediately available to respond to emergencies See telemetry face sheet for immediately available MD    Location [facility]    Staff Present Powell Benders, BS, Exercise Physiologist;Golden Gilreath Vicci, RN, BSN;Laureen Brown, BS, RRT, CPFT    Virtual Visit No    Medication changes reported     No    Fall or balance concerns reported    No    Warm-up and Cool-down Performed on first and last piece of equipment    Resistance Training Performed Yes    VAD Patient? No    PAD/SET Patient? No      Pain Assessment   Currently in Pain? No/denies    Multiple Pain Sites No          Capillary Blood Glucose: No results found for this or any previous visit (from the past 24 hours).    Social History   Tobacco Use  Smoking Status Former   Current packs/day: 0.00   Average packs/day: 0.5 packs/day for 45.0 years (22.5 ttl pk-yrs)   Types: Cigarettes   Start date: 01/12/1983   Quit date: 11/17/2016   Years since quitting: 6.9  Smokeless Tobacco Never    Goals Met:  Independence with exercise equipment Exercise tolerated well No report of concerns or symptoms today Strength training completed today  Goals Unmet:  Not Applicable  Comments: Pt able to follow exercise prescription today without complaint.  Will continue to monitor for progression.

## 2023-11-17 ENCOUNTER — Encounter (HOSPITAL_COMMUNITY)

## 2023-11-17 ENCOUNTER — Encounter (HOSPITAL_COMMUNITY)
Admission: RE | Admit: 2023-11-17 | Discharge: 2023-11-17 | Disposition: A | Discharge status: Home / Self Care | Source: Ambulatory Visit | Attending: Internal Medicine | Admitting: Internal Medicine

## 2023-11-17 DIAGNOSIS — Z952 Presence of prosthetic heart valve: Secondary | ICD-10-CM

## 2023-11-17 NOTE — Progress Notes (Signed)
 Daily Session Note  Patient Details  Name: Terry Pacheco MRN: [id number] Date of Birth: 03-07-1950 Referring Provider:   Flowsheet Row CARDIAC REHAB PHASE II ORIENTATION from 11/03/2023 in [facility] CARDIAC REHABILITATION  Referring Provider Barnet Cough MD    Encounter Date: 11/17/2023  Check In:  Session Check In - 11/17/23 1026       Check-In   Supervising physician immediately available to respond to emergencies See telemetry face sheet for immediately available MD    Location [facility]    Staff Present Powell Benders, BS, Exercise Physiologist;Kierstynn Babich Jackquline, BSN, RN, Estrella Daring, BS, RRT, CPFT    Virtual Visit No    Medication changes reported     No    Fall or balance concerns reported    No    Tobacco Cessation No Change    Warm-up and Cool-down Performed on first and last piece of equipment    Resistance Training Performed Yes    VAD Patient? No    PAD/SET Patient? No      Pain Assessment   Currently in Pain? No/denies          Capillary Blood Glucose: No results found for this or any previous visit (from the past 24 hours).    Social History   Tobacco Use  Smoking Status Former   Current packs/day: 0.00   Average packs/day: 0.5 packs/day for 45.0 years (22.5 ttl pk-yrs)   Types: Cigarettes   Start date: 01/12/1983   Quit date: 11/17/2016   Years since quitting: 7.0  Smokeless Tobacco Never    Goals Met:  Independence with exercise equipment Exercise tolerated well No report of concerns or symptoms today Strength training completed today  Goals Unmet:  Not Applicable  Comments: Pt able to follow exercise prescription today without complaint.  Will continue to monitor for progression.

## 2023-11-19 ENCOUNTER — Encounter (HOSPITAL_COMMUNITY)

## 2023-11-19 ENCOUNTER — Encounter (HOSPITAL_COMMUNITY)
Admission: RE | Admit: 2023-11-19 | Discharge: 2023-11-19 | Disposition: A | Discharge status: Home / Self Care | Source: Ambulatory Visit | Attending: Internal Medicine | Admitting: Internal Medicine

## 2023-11-19 DIAGNOSIS — Z952 Presence of prosthetic heart valve: Secondary | ICD-10-CM | POA: Diagnosis not present

## 2023-11-19 NOTE — Progress Notes (Signed)
 I have reviewed a Home Exercise Prescription with [name] . Terry Pacheco is  currently exercising at home by walking and working in his yard.  The patient was advised to exercise 5 days a week for 30-45 minutes.  [name] and I discussed how to progress their exercise prescription.  The patient stated that their goals were increase his strength and endurance to get walking more.  The patient stated that they understand the exercise prescription.  We reviewed exercise guidelines, target heart rate during exercise, RPE Scale, weather conditions, NTG use, endpoints for exercise, warmup and cool down.  Patient is encouraged to come to me with any questions. I will continue to follow up with the patient to assist them with progression and safety.

## 2023-11-19 NOTE — Progress Notes (Signed)
 Called and spoke to pt's wife Anguel Delapena, [place]) and advised of PFT results per Dr. Darlean. Mrs. Scarfo verbalized understanding, NFN. I let her know to keep appt on 11/23/23 with Dr. Darlean to review everything.

## 2023-11-19 NOTE — Progress Notes (Signed)
 Daily Session Note  Patient Details  Name: DEANDREA RION MRN: [id number] Date of Birth: 1951/01/13 Referring Provider:   Flowsheet Row CARDIAC REHAB PHASE II ORIENTATION from 11/03/2023 in [facility] CARDIAC REHABILITATION  Referring Provider Barnet Cough MD    Encounter Date: 11/19/2023  Check In:  Session Check In - 11/19/23 1047       Check-In   Supervising physician immediately available to respond to emergencies See telemetry face sheet for immediately available MD    Location [facility]    Staff Present Powell Benders, BS, Exercise Physiologist;Samreet Edenfield Vonzell, MA, RCEP, CCRP, CCET;Victoria Merrill, RN    Virtual Visit No    Medication changes reported     No    Fall or balance concerns reported    No    Warm-up and Cool-down Performed on first and last piece of equipment    Resistance Training Performed Yes    VAD Patient? No    PAD/SET Patient? No      Pain Assessment   Currently in Pain? No/denies          Capillary Blood Glucose: No results found for this or any previous visit (from the past 24 hours).   Exercise Prescription Changes - 11/19/23 1000       Home Exercise Plan   Plans to continue exercise at Home (comment)    Frequency Add 3 additional days to program exercise sessions.    Initial Home Exercises Provided 11/19/23          Social History   Tobacco Use  Smoking Status Former   Current packs/day: 0.00   Average packs/day: 0.5 packs/day for 45.0 years (22.5 ttl pk-yrs)   Types: Cigarettes   Start date: 01/12/1983   Quit date: 11/17/2016   Years since quitting: 7.0  Smokeless Tobacco Never    Goals Met:  Independence with exercise equipment Exercise tolerated well No report of concerns or symptoms today Strength training completed today  Goals Unmet:  Not Applicable  Comments: Pt able to follow exercise prescription today without complaint.  Will continue to monitor for progression.

## 2023-11-22 NOTE — Progress Notes (Unsigned)
 Terry Pacheco, male    DOB: 03-15-1950   MRN: [id number]   Brief patient profile:  73 yowm quit smoking 2018 with  throat Ca s/p radiation/ chemo Nannette now ENT of record)  at [facility]  referred to pulmonary clinic 08/07/2023 by Gayl Brinks  for doe       Patient admitted on: 06/27/2023  Discharge date: Jun 29, 2023   Day of admission HPI:  Patient p/w progressive dyspnea x 2 weeks. Saw his PCP last week about it and prescribed inhalers, which did not seem to help much. Over the past several days, he has noticed increased swelling in legs as well as a weight gain of about 5 lb. No known prior h/o CHF.Patient is not on a diuretic typically. Has slept in recliner for several years and hasn't slept well for the past week although unclear if this is due to overt orthopnea. Used to smoke more heavily but only smokes intermittently now. Does have issues w/ urinary retention related to BPH despite being on meds.    ASSESSMENT & PLAN (In order of descending acuity)  Acute hypoxic respiratory failure - Initially on 4L but still tachypneic so placed on BIPAP. CXR notable for pulm edema. Based on clinical exam and history, more suspicious that symptoms 2/2 CHF exacerbation. Last echo in 2021 w/ grade I DD but intact LVEF.   initiate diuresis w/ BID IV Lasix. Strict I/Os, daily weights, 2g Na diet. Will defer cardiology input to daytime providers.  ? COPD - Questionable component of COPD exac. No clear evidence of wheezing but noted to feel symptomatically improved after Duoneb. Quad screen neg. Will go ahead and schedule for now along w/ IV steroids but can consider stopping if clinical exam does not appear consistent with this. BPH - Complicated by urinary retention despite home Flomax /Finasteride . Foley placed in ED and will need TOV in following days. H/o CAD - Non-obstructive. On home ASA/statin.    ADDITIONAL PATIENT FINDINGS OR OBSERVATIONS  On morning rounds today, patient is resting comfortably  in bed finishing the last few bites of his breakfast. I came back 5 minutes later to allow him to finish his breakfast.  Patient is doing quite well and he states that he feels 100% better. We are waiting for his echo test to result. Cardiology service, Jodie Medley, has already seen patient this morning. He will round on patient with Dr. Halista once echo is completed. Patient wishes to see [facility] cardiology at [facility]. We explained that depending on his echo, and his cardiology needs, he may be seen by cardiology service that can meet his needs. If [facility] cardiology in [place] can meet those needs, they will be happy to see him. Patient will also need pulmonary referral. I advised the patient we will make the pulmonary referral at the same place where he ends up going for cardiology.  Last echo 2021: => Ejection fraction 60 to 65% with grade 1 diastolic dysfunction and moderate aortic stenosis.  06/29/23  echo at [facility] showecd EF of 40 to 45% with decreased left ventricular systolic function. There was mild to moderate aortic regurgitation as well as aortic sclerosis. Please see report for full details.  Patient initially was on 4 L of oxygen on admission Transition to the 3 L yesterday and eventually to 2 L. Late yesterday afternoon/early evening, he transition to 1 L. Overnight, he was taken off of oxygen altogether. Currently he is on room air today on 06/29/2023 and saturating  at 91% stating he feels 100% fine.    History of Present Illness  08/07/2023  Pulmonary/1st office eval/Nevyn Bossman  BREO  with BOO so no LAMA  Chief Complaint  Patient presents with   Follow-up    CHF and COPD Consult from [facility]  Dyspnea:  baseline before admit = down the road from his house to end of road  about a half mile x one half mile and then  back up hill uphill now goes half way and almost  Cough: improving but was heavy in am before abx  Sleep: 30 degrees electric bed one pillow x years can't breath flat   SABA use: not needing  hfa at all  02 ldz:wnwz  Last prednisone  w/in a week  Rec Plan A = Automatic = Always=    Breo 100 one click each  am first thing  Plan B = Backup (to supplement plan A, not to replace it) Only use your albuterol  inhaler as a rescue medication Plan C = Crisis (instead of Plan B but only if Plan B stops working) - only use your albuterol  nebulizer if you first try Plan B and it fails to help   - PFT's 11/06/23 FEV1 2.25 (83 % ) ratio 0.64 p 2 % improvement from saba p 0 prior to study with DLCO 17.15 (75%) and FV curve min concave      11/23/2023  f/u ov/Aadarsh Cozort re: GOLD 1 COPD   maint on BREO 100 on click each am   Chief Complaint  Patient presents with   COPD    Review PFT 11/06/2023  Dyspnea:  doing  great  with cardiac rehab  Cough: minimal mucoid / no flare in am  Sleeping: 30 degrees s  resp cc  SABA use: not since July  02: no   Lung cancer screening :  referred 11/23/2023     No obvious day to day or daytime variability or assoc   purulent sputum or mucus plugs or hemoptysis or cp or chest tightness, subjective wheeze or overt sinus or hb symptoms.    Also denies any obvious fluctuation of symptoms with weather or environmental changes or other aggravating or alleviating factors except as outlined above   No unusual exposure hx or h/o childhood pna/ asthma or knowledge of premature birth.  Current Allergies, Complete Past Medical History, Past Surgical History, Family History, and Social History were reviewed in [employer] record.  ROS  The following are not active complaints unless bolded Hoarseness, sore throat, dysphagia, dental problems, itching, sneezing,  nasal congestion or discharge of excess mucus or purulent secretions, ear ache,   fever, chills, sweats, unintended wt loss or wt gain, classically pleuritic or exertional cp,  orthopnea pnd or arm/hand swelling  or leg swelling, presyncope, palpitations, abdominal pain,  anorexia, nausea, vomiting, diarrhea  or change in bowel habits or change in bladder habits, change in stools or change in urine, dysuria, hematuria,  rash, arthralgias, visual complaints, headache, numbness, weakness or ataxia or problems with walking or coordination,  change in mood or  memory.        Current Meds  Medication Sig   acetaminophen  (TYLENOL ) 500 MG tablet Take 1,000 mg by mouth daily as needed for moderate pain or headache.   albuterol  (ACCUNEB ) 1.25 MG/3ML nebulizer solution Take 3 mLs (1.25 mg total) by nebulization every 6 (six) hours as needed for wheezing.   amoxicillin  (AMOXIL ) 500 MG tablet Take 2,000 mg by mouth once. Take  4 tablets one time before dental procedure   aspirin  EC 81 MG tablet Take 81 mg by mouth daily.   atorvastatin  (LIPITOR) 40 MG tablet Take 1 tablet (40 mg total) by mouth daily.   B Complex CAPS TAKE 1 CAPSULE BY MOUTH EVERY DAY   calcium  carbonate (TUMS - DOSED IN MG ELEMENTAL CALCIUM ) 500 MG chewable tablet Chew 2 tablets by mouth daily as needed for indigestion or heartburn.   empagliflozin (JARDIANCE) 10 MG TABS tablet Take 1 tablet by mouth daily.   escitalopram  (LEXAPRO ) 20 MG tablet TAKE 1 TABLET BY MOUTH EVERY DAY   ezetimibe  (ZETIA ) 10 MG tablet Take 1 tablet (10 mg total) by mouth daily.   fluticasone  furoate-vilanterol (BREO ELLIPTA ) 200-25 MCG/ACT AEPB Inhale 1 puff into the lungs daily.   Glucosamine 500 MG CAPS Take by mouth.   metoprolol succinate (TOPROL-XL) 25 MG 24 hr tablet Take 25 mg by mouth daily.   omeprazole  (PRILOSEC) 20 MG capsule TAKE 1 CAPSULE BY MOUTH EVERY DAY   sacubitril-valsartan (ENTRESTO) 24-26 MG Take 1 tablet by mouth 2 (two) times daily.   silodosin (RAPAFLO) 8 MG CAPS capsule Take 8 mg by mouth daily with breakfast.   spironolactone (ALDACTONE) 25 MG tablet Take 12.5 mg by mouth daily.   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    Hypertension    Sleep apnea    does not use CPAP   Wears glasses       Objective:    Wts   11/23/2023       184   11/03/23 181 lb 4.8 oz (82.2 kg)  08/17/23 167 lb 1.7 oz (75.8 kg)  08/07/23 171 lb 6.4 oz (77.7 kg)    Vital signs reviewed  11/23/2023  - Note at rest 02 sats  92% on RA   General appearance:    amb wm nad    HEENT : Oropharynx  clear      NECK :  without  apparent JVD/ palpable Nodes/TM    LUNGS: no acc muscle use,  Mild barrel  contour chest wall with bilateral  Distant bs s audible wheeze and  without cough on insp or exp maneuvers  and mild  Hyperresonant  to  percussion bilaterally     CV:  RRR  no s3 or murmur or increase in P2, and no edema   ABD:  soft and nontender   MS:  Nl gait/ ext warm without deformities Or obvious joint restrictions  calf tenderness, cyanosis -- very mild clubbing     SKIN: warm and dry without lesions    NEURO:  alert, approp, nl sensorium with  no motor or cerebellar deficits apparent.         Assessment     Assessment & Plan Former cigarette smoker  Low-dose CT lung cancer screening is recommended for patients who are 23-70 years of age with a 20+ pack-year history of smoking and who are currently smoking or quit <=15 years ago. No coughing up blood  No unintentional weight loss of > 15 pounds in the last 6 months - pt is eligible for scanning yearly until age 51 > referred   11/23/2023   COPD GOLD 1 Quit smoking  2018  -  06/27/23  echo at [facility] shows EF of 40 to 45% with decreased left ventricular systolic function. There was mild to moderate aortic regurgitation as well as aortic sclerosis -  08/07/2023   Walked on RA  x  3  lap(s) =  approx 750  ft  @ mod pace, stopped due to end of study with lowest 02 sats 93% and no sob   -  PFT's  11/06/23  FEV1 2.25 (83 % ) ratio 0.64  p 2 % improvement from saba p 0 prior to study with DLCO  17.15 (75%)   and FV curve min concave    Very mild copd with mostly AB features > continue BREO  100 one click daily   F/u is prn if PCP willing to refill BREO 100 and continues to do well    AVS  Patient Instructions  My office will be contacting you by phone for referral to lung cancer screening   [phone]) - if you don't hear back from my office within one week,  please call us  back or notify us  thru MyChart and we'll address it right away.      I will see you as needed in pulmonary clinic in [facility]   Ozell America, MD 11/23/2023

## 2023-11-23 ENCOUNTER — Ambulatory Visit: Admitting: Internal Medicine

## 2023-11-23 ENCOUNTER — Encounter: Payer: Self-pay | Admitting: Internal Medicine

## 2023-11-23 VITALS — BP 124/72 | HR 75 | Temp 98.2°F | Ht 69.0 in | Wt 184.6 lb

## 2023-11-23 DIAGNOSIS — J449 Chronic obstructive pulmonary disease, unspecified: Secondary | ICD-10-CM | POA: Diagnosis not present

## 2023-11-23 DIAGNOSIS — Z87891 Personal history of nicotine dependence: Secondary | ICD-10-CM | POA: Diagnosis not present

## 2023-11-23 NOTE — Assessment & Plan Note (Addendum)
 Quit smoking  2018  -  06/27/23  echo at [facility] shows EF of 40 to 45% with decreased left ventricular systolic function. There was mild to moderate aortic regurgitation as well as aortic sclerosis -  08/07/2023   Walked on RA  x  3  lap(s) =  approx 750  ft  @ mod pace, stopped due to end of study with lowest 02 sats 93% and no sob   -  PFT's  11/06/23  FEV1 2.25 (83 % ) ratio 0.64  p 2 % improvement from saba p 0 prior to study with DLCO  17.15 (75%)   and FV curve min concave    Very mild copd with mostly AB features > continue BREO  100 one click daily   F/u is prn if PCP willing to refill BREO 100 and continues to do well

## 2023-11-23 NOTE — Assessment & Plan Note (Addendum)
 Low-dose CT lung cancer screening is recommended for patients who are 62-73 years of age with a 20+ pack-year history of smoking and who are currently smoking or quit <=15 years ago. No coughing up blood  No unintentional weight loss of > 15 pounds in the last 6 months - pt is eligible for scanning yearly until age 69 > referred  11/23/2023

## 2023-11-23 NOTE — Patient Instructions (Signed)
 My office will be contacting you by phone for referral to lung cancer screening   [phone]) - if you don't hear back from my office within one week,  please call us  back or notify us  thru MyChart and we'll address it right away.      I will see you as needed in pulmonary clinic in [facility]

## 2023-11-24 ENCOUNTER — Encounter (HOSPITAL_COMMUNITY)
Admission: RE | Admit: 2023-11-24 | Discharge: 2023-11-24 | Disposition: A | Discharge status: Home / Self Care | Source: Ambulatory Visit | Attending: Internal Medicine | Admitting: Internal Medicine

## 2023-11-24 ENCOUNTER — Encounter (HOSPITAL_COMMUNITY)

## 2023-11-24 DIAGNOSIS — Q676 Pectus excavatum: Secondary | ICD-10-CM | POA: Diagnosis not present

## 2023-11-24 DIAGNOSIS — I509 Heart failure, unspecified: Secondary | ICD-10-CM | POA: Diagnosis not present

## 2023-11-24 DIAGNOSIS — J42 Unspecified chronic bronchitis: Secondary | ICD-10-CM | POA: Diagnosis not present

## 2023-11-24 DIAGNOSIS — Z952 Presence of prosthetic heart valve: Secondary | ICD-10-CM

## 2023-11-24 DIAGNOSIS — K219 Gastro-esophageal reflux disease without esophagitis: Secondary | ICD-10-CM | POA: Diagnosis not present

## 2023-11-24 DIAGNOSIS — N401 Enlarged prostate with lower urinary tract symptoms: Secondary | ICD-10-CM | POA: Diagnosis not present

## 2023-11-24 DIAGNOSIS — I10 Essential (primary) hypertension: Secondary | ICD-10-CM | POA: Diagnosis not present

## 2023-11-24 DIAGNOSIS — E785 Hyperlipidemia, unspecified: Secondary | ICD-10-CM | POA: Diagnosis not present

## 2023-11-24 DIAGNOSIS — I350 Nonrheumatic aortic (valve) stenosis: Secondary | ICD-10-CM | POA: Diagnosis not present

## 2023-11-24 DIAGNOSIS — I2510 Atherosclerotic heart disease of native coronary artery without angina pectoris: Secondary | ICD-10-CM | POA: Diagnosis not present

## 2023-11-24 DIAGNOSIS — C099 Malignant neoplasm of tonsil, unspecified: Secondary | ICD-10-CM | POA: Diagnosis not present

## 2023-11-24 DIAGNOSIS — E039 Hypothyroidism, unspecified: Secondary | ICD-10-CM | POA: Diagnosis not present

## 2023-11-24 DIAGNOSIS — F411 Generalized anxiety disorder: Secondary | ICD-10-CM | POA: Diagnosis not present

## 2023-11-24 NOTE — Progress Notes (Signed)
 Daily Session Note  Patient Details  Name: Terry Pacheco MRN: [id number] Date of Birth: 19-Nov-1950 Referring Provider:   Flowsheet Row CARDIAC REHAB PHASE II ORIENTATION from 11/03/2023 in [facility] CARDIAC REHABILITATION  Referring Provider Barnet Cough MD    Encounter Date: 11/24/2023  Check In:  Session Check In - 11/24/23 1043       Check-In   Supervising physician immediately available to respond to emergencies See telemetry face sheet for immediately available MD    Location [facility]-Cardiac & Pulmonary [facility]hab    Staff Present Powell Benders, BS, Exercise Physiologist;Brittany Jackquline, BSN, RN, WTA-C    Virtual Visit No    Medication changes reported     No    Fall or balance concerns reported    No    Warm-up and Cool-down Performed on first and last piece of equipment    Resistance Training Performed Yes    VAD Patient? No    PAD/SET Patient? No      Pain Assessment   Currently in Pain? No/denies          Capillary Blood Glucose: No results found for this or any previous visit (from the past 24 hours).    Social History   Tobacco Use  Smoking Status Former   Current packs/day: 0.00   Average packs/day: 0.5 packs/day for 45.0 years (22.5 ttl pk-yrs)   Types: Cigarettes   Start date: 01/12/1983   Quit date: 11/17/2016   Years since quitting: 7.0  Smokeless Tobacco Never    Goals Met:  Independence with exercise equipment Exercise tolerated well No report of concerns or symptoms today Strength training completed today  Goals Unmet:  Not Applicable  Comments: Pt able to follow exercise prescription today without complaint.  Will continue to monitor for progression.

## 2023-11-26 ENCOUNTER — Encounter (HOSPITAL_COMMUNITY)
Admission: RE | Admit: 2023-11-26 | Discharge: 2023-11-26 | Disposition: A | Discharge status: Home / Self Care | Source: Ambulatory Visit | Attending: Internal Medicine | Admitting: Internal Medicine

## 2023-11-26 ENCOUNTER — Encounter (HOSPITAL_COMMUNITY)

## 2023-11-26 DIAGNOSIS — Z48812 Encounter for surgical aftercare following surgery on the circulatory system: Secondary | ICD-10-CM | POA: Insufficient documentation

## 2023-11-26 DIAGNOSIS — Z952 Presence of prosthetic heart valve: Secondary | ICD-10-CM | POA: Diagnosis not present

## 2023-11-26 DIAGNOSIS — I700 Atherosclerosis of aorta: Secondary | ICD-10-CM | POA: Insufficient documentation

## 2023-11-26 DIAGNOSIS — I2510 Atherosclerotic heart disease of native coronary artery without angina pectoris: Secondary | ICD-10-CM | POA: Insufficient documentation

## 2023-11-26 DIAGNOSIS — J439 Emphysema, unspecified: Secondary | ICD-10-CM | POA: Diagnosis not present

## 2023-11-26 DIAGNOSIS — Z87891 Personal history of nicotine dependence: Secondary | ICD-10-CM | POA: Insufficient documentation

## 2023-11-26 DIAGNOSIS — Z122 Encounter for screening for malignant neoplasm of respiratory organs: Secondary | ICD-10-CM | POA: Insufficient documentation

## 2023-11-26 DIAGNOSIS — I5022 Chronic systolic (congestive) heart failure: Secondary | ICD-10-CM | POA: Diagnosis not present

## 2023-11-26 DIAGNOSIS — K449 Diaphragmatic hernia without obstruction or gangrene: Secondary | ICD-10-CM | POA: Diagnosis not present

## 2023-11-26 NOTE — Progress Notes (Signed)
 Daily Session Note  Patient Details  Name: CLEOTIS SPARR MRN: [id number] Date of Birth: December 21, 1950 Referring Provider:   Flowsheet Row CARDIAC REHAB PHASE II ORIENTATION from 11/03/2023 in [facility] CARDIAC REHABILITATION  Referring Provider Barnet Cough MD    Encounter Date: 11/26/2023  Check In:  Session Check In - 11/26/23 1029       Check-In   Supervising physician immediately available to respond to emergencies See telemetry face sheet for immediately available MD    Location [facility]    Staff Present Powell Benders, BS, Exercise Physiologist;Taya Ashbaugh Vonzell, MA, RCEP, CCRP, CCET;Hillary Troutman BSN, RN    Virtual Visit No    Medication changes reported     No    Fall or balance concerns reported    No    Warm-up and Cool-down Performed on first and last piece of equipment    Resistance Training Performed Yes    VAD Patient? No    PAD/SET Patient? No      Pain Assessment   Currently in Pain? No/denies          Capillary Blood Glucose: No results found for this or any previous visit (from the past 24 hours).    Social History   Tobacco Use  Smoking Status Former   Current packs/day: 0.00   Average packs/day: 0.5 packs/day for 45.0 years (22.5 ttl pk-yrs)   Types: Cigarettes   Start date: 01/12/1983   Quit date: 11/17/2016   Years since quitting: 7.0  Smokeless Tobacco Never    Goals Met:  Independence with exercise equipment Exercise tolerated well No report of concerns or symptoms today Strength training completed today  Goals Unmet:  Not Applicable  Comments: Pt able to follow exercise prescription today without complaint.  Will continue to monitor for progression.

## 2023-11-27 DIAGNOSIS — I519 Heart disease, unspecified: Secondary | ICD-10-CM | POA: Diagnosis not present

## 2023-11-27 DIAGNOSIS — I2510 Atherosclerotic heart disease of native coronary artery without angina pectoris: Secondary | ICD-10-CM | POA: Diagnosis not present

## 2023-11-27 DIAGNOSIS — I10 Essential (primary) hypertension: Secondary | ICD-10-CM | POA: Diagnosis not present

## 2023-11-27 DIAGNOSIS — Z952 Presence of prosthetic heart valve: Secondary | ICD-10-CM | POA: Diagnosis not present

## 2023-11-27 DIAGNOSIS — I5020 Unspecified systolic (congestive) heart failure: Secondary | ICD-10-CM | POA: Diagnosis not present

## 2023-11-28 DIAGNOSIS — I10 Essential (primary) hypertension: Secondary | ICD-10-CM | POA: Diagnosis not present

## 2023-11-30 ENCOUNTER — Telehealth: Payer: Self-pay

## 2023-11-30 DIAGNOSIS — Z122 Encounter for screening for malignant neoplasm of respiratory organs: Secondary | ICD-10-CM

## 2023-11-30 DIAGNOSIS — Z87891 Personal history of nicotine dependence: Secondary | ICD-10-CM

## 2023-11-30 NOTE — Telephone Encounter (Signed)
 Lung Cancer Screening Narrative/Criteria Questionnaire (Cigarette Smokers Only- No Cigars/Pipes/vapes)   Terry Pacheco   SDMV:12/11/2023 10:00 am  Terry Pacheco      1950-08-17               LDCT: 12/15/2023  10:30 am [facility]    73 y.o.   Phone: [phone]  Lung Screening Narrative (confirm age 52-77 yrs Medicare / 50-80 yrs Private pay insurance)   Insurance information: HTA   Referring Provider: Darlean   This screening involves an initial phone call with a team member from our program. It is called a shared decision making visit. The initial meeting is required by  insurance and Medicare to make sure you understand the program. This appointment takes about 15-20 minutes to complete. You will complete the screening scan at your scheduled date/time.  This scan takes about 5-10 minutes to complete. You can eat and drink normally before and after the scan.  Criteria questions for Lung Cancer Screening:   Are you a current or former smoker? Former Age began smoking: 18   If you are a former smoker, what year did you quit smoking? Quit 2025 (within 15 yrs)   To calculate your smoking history, I need an accurate estimate of how many packs of cigarettes you smoked per day and for how many years. (Not just the number of PPD you are now smoking)   Years smoking 55 x Packs per day 2 = Pack years 110   (at least 20 pack yrs)   (Make sure they understand that we need to know how much they have smoked in the past, not just the number of PPD they are smoking now)  Do you have a personal history of cancer?  Yes - (type and when diagnosed - 5 yrs cancer free) Throat cancer 2018    Do you have a family history of cancer? No  Are you coughing up blood?  No  Have you had unexplained weight loss of 15 lbs or more in the last 6 months? No  It looks like you meet all criteria.  When would be a good time for us  to schedule you for this screening?   Additional information: N/A

## 2023-12-01 ENCOUNTER — Encounter (HOSPITAL_COMMUNITY)

## 2023-12-01 ENCOUNTER — Encounter (HOSPITAL_COMMUNITY)
Admission: RE | Admit: 2023-12-01 | Discharge: 2023-12-01 | Disposition: A | Source: Ambulatory Visit | Attending: Internal Medicine

## 2023-12-01 DIAGNOSIS — Z48812 Encounter for surgical aftercare following surgery on the circulatory system: Secondary | ICD-10-CM | POA: Diagnosis not present

## 2023-12-01 DIAGNOSIS — Z952 Presence of prosthetic heart valve: Secondary | ICD-10-CM

## 2023-12-01 NOTE — Progress Notes (Signed)
 Daily Session Note  Patient Details  Name: Terry Pacheco MRN: [id number] Date of Birth: Sep 17, 1950 Referring Provider:   Flowsheet Row CARDIAC REHAB PHASE II ORIENTATION from 11/03/2023 in [facility] CARDIAC REHABILITATION  Referring Provider Barnet Cough MD    Encounter Date: 12/01/2023  Check In:  Session Check In - 12/01/23 1009       Check-In   Supervising physician immediately available to respond to emergencies See telemetry face sheet for immediately available MD    Location [facility]-Cardiac [facility]    Staff Present Laymon Rattler, BSN, RN, Estrella Daring, BS, RRT, CPFT;Heather Con HECKLE, Exercise Physiologist    Virtual Visit No    Medication changes reported     No    Fall or balance concerns reported    No    Tobacco Cessation No Change    Warm-up and Cool-down Performed on first and last piece of equipment    Resistance Training Performed Yes    VAD Patient? No    PAD/SET Patient? No      Pain Assessment   Currently in Pain? No/denies          Capillary Blood Glucose: No results found for this or any previous visit (from the past 24 hours).    Social History   Tobacco Use  Smoking Status Former   Current packs/day: 0.00   Average packs/day: 0.5 packs/day for 45.0 years (22.5 ttl pk-yrs)   Types: Cigarettes   Start date: 01/12/1983   Quit date: 11/17/2016   Years since quitting: 7.0  Smokeless Tobacco Never    Goals Met:  Independence with exercise equipment Exercise tolerated well No report of concerns or symptoms today Strength training completed today  Goals Unmet:  Not Applicable  Comments: Pt able to follow exercise prescription today without complaint.  Will continue to monitor for progression.

## 2023-12-02 ENCOUNTER — Encounter (HOSPITAL_COMMUNITY): Payer: Self-pay | Admitting: *Deleted

## 2023-12-02 DIAGNOSIS — Z952 Presence of prosthetic heart valve: Secondary | ICD-10-CM

## 2023-12-02 NOTE — Progress Notes (Signed)
 Cardiac Individual Treatment Plan  Patient Details  Name: Terry Pacheco MRN: [id number] Date of Birth: Jul 24, 1950 Referring Provider:   Flowsheet Row CARDIAC REHAB PHASE II ORIENTATION from 11/03/2023 in [facility]  Referring Provider Barnet Cough MD    Initial Encounter Date:  Flowsheet Row CARDIAC REHAB PHASE II ORIENTATION from 11/03/2023 in [facility]  Date 11/03/23    Visit Diagnosis: S/P TAVR (transcatheter aortic valve replacement)  Patient's Home Medications on Admission:  Current Outpatient Medications:    acetaminophen  (TYLENOL ) 500 MG tablet, Take 1,000 mg by mouth daily as needed for moderate pain or headache., Disp: , Rfl:    albuterol  (ACCUNEB ) 1.25 MG/3ML nebulizer solution, Take 3 mLs (1.25 mg total) by nebulization every 6 (six) hours as needed for wheezing., Disp: 75 mL, Rfl: 12   amoxicillin  (AMOXIL ) 500 MG tablet, Take 2,000 mg by mouth once. Take 4 tablets one time before dental procedure, Disp: , Rfl:    aspirin  EC 81 MG tablet, Take 81 mg by mouth daily., Disp: , Rfl:    atorvastatin  (LIPITOR) 40 MG tablet, Take 1 tablet (40 mg total) by mouth daily., Disp: 90 tablet, Rfl: 3   B Complex CAPS, TAKE 1 CAPSULE BY MOUTH EVERY DAY, Disp: 100 capsule, Rfl: 2   calcium  carbonate (TUMS - DOSED IN MG ELEMENTAL CALCIUM ) 500 MG chewable tablet, Chew 2 tablets by mouth daily as needed for indigestion or heartburn., Disp: , Rfl:    empagliflozin (JARDIANCE) 10 MG TABS tablet, Take 1 tablet by mouth daily., Disp: , Rfl:    escitalopram  (LEXAPRO ) 20 MG tablet, TAKE 1 TABLET BY MOUTH EVERY DAY, Disp: 90 tablet, Rfl: 0   ezetimibe  (ZETIA ) 10 MG tablet, Take 1 tablet (10 mg total) by mouth daily., Disp: 90 tablet, Rfl: 0   fluticasone  furoate-vilanterol (BREO ELLIPTA ) 200-25 MCG/ACT AEPB, Inhale 1 puff into the lungs daily., Disp: 1 each, Rfl: 11   Glucosamine 500 MG CAPS, Take by mouth., Disp: , Rfl:    metoprolol succinate  (TOPROL-XL) 25 MG 24 hr tablet, Take 25 mg by mouth daily., Disp: , Rfl:    omeprazole  (PRILOSEC) 20 MG capsule, TAKE 1 CAPSULE BY MOUTH EVERY DAY, Disp: 90 capsule, Rfl: 3   sacubitril-valsartan (ENTRESTO) 24-26 MG, Take 1 tablet by mouth 2 (two) times daily., Disp: , Rfl:    silodosin (RAPAFLO) 8 MG CAPS capsule, Take 8 mg by mouth daily with breakfast., Disp: , Rfl:    spironolactone (ALDACTONE) 25 MG tablet, Take 12.5 mg by mouth daily., Disp: , Rfl:    Vitamin D , Ergocalciferol , (DRISDOL ) 1.25 MG (50000 UNIT) CAPS capsule, TAKE 1 CAPSULE (50,000 UNITS TOTAL) BY MOUTH EVERY 7 (SEVEN) DAYS, Disp: 13 capsule, Rfl: 3  Past Medical History: Past Medical History:  Diagnosis Date   Aortic stenosis    Mild to moderate by echo 11/2016   Arthritis    Cancer (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    Hypertension    Sleep apnea    does not use CPAP   Wears glasses     Tobacco Use: Social History   Tobacco Use  Smoking  Status Former   Current packs/day: 0.00   Average packs/day: 0.5 packs/day for 45.0 years (22.5 ttl pk-yrs)   Types: Cigarettes   Start date: 01/12/1983   Quit date: 11/17/2016   Years since quitting: 7.0  Smokeless Tobacco Never    Labs: Review Flowsheet  More data exists      Latest Ref Rng & Units 04/18/2021 10/21/2021 04/22/2022 10/21/2022 04/21/2023  Labs for ITP Cardiac and Pulmonary Rehab  Cholestrol 100 - 199 mg/dL 892  889  880  882  895   LDL (calc) 0 - 99 mg/dL 49  45  49  44  44   HDL-C >39 mg/dL 44  54  58  61  47   Trlycerides 0 - 149 mg/dL 65  39  51  51  56     Capillary Blood Glucose: Lab Results   Component Value Date   GLUCAP 93 12/08/2017   GLUCAP 106 (H) 04/29/2017   GLUCAP 105 (H) 12/02/2016     Exercise Target Goals: Exercise Program Goal: Individual exercise prescription set using results from initial 6 min walk test and THRR while considering  patient's activity barriers and safety.   Exercise Prescription Goal: Starting with aerobic activity 30 plus minutes a day, 3 days per week for initial exercise prescription. Provide home exercise prescription and guidelines that participant acknowledges understanding prior to discharge.  Activity Barriers & Risk Stratification:  Activity Barriers & Cardiac Risk Stratification - 11/03/23 1031       Activity Barriers & Cardiac Risk Stratification   Activity Barriers Shortness of Breath;Deconditioning;Muscular Weakness;Right Hip Replacement;Arthritis;Balance Concerns    Cardiac Risk Stratification Moderate          6 Minute Walk:  6 Minute Walk     Row Name 11/03/23 1257         6 Minute Walk   Phase Initial     Distance 1230 feet     Walk Time 6 minutes     # of Rest Breaks 0     MPH 2.33     METS 2.67     RPE 11     VO2 Peak 9.36     Symptoms No     Resting HR 67 bpm     Resting BP 144/62     Resting Oxygen Saturation  96 %     Exercise Oxygen Saturation  during 6 min walk 96 %     Max Ex. HR 96 bpm     Max Ex. BP 154/74     2 Minute Post BP 132/70        Oxygen Initial Assessment:   Oxygen Re-Evaluation:   Oxygen Discharge (Final Oxygen Re-Evaluation):   Initial Exercise Prescription:  Initial Exercise Prescription - 11/03/23 1300       Date of Initial Exercise RX and Referring Provider   Date 11/03/23    Referring Provider Barnet Cough MD      Oxygen   Maintain Oxygen Saturation 88% or higher      Treadmill   MPH 2.2    Grade 1    Minutes 15    METs 2.99      REL-XR   Level 3    Speed 50    Minutes 15    METs 3      Prescription Details   Frequency (times per week) 2     Duration Progress to 30 minutes of continuous aerobic without signs/symptoms of physical distress  Intensity   THRR 40-80% of Max Heartrate 99-131    Ratings of Perceived Exertion 11-13    Perceived Dyspnea 0-4      Progression   Progression Continue to progress workloads to maintain intensity without signs/symptoms of physical distress.      Resistance Training   Training Prescription Yes    Weight 4 lb    Reps 10-15          Perform Capillary Blood Glucose checks as needed.  Exercise Prescription Changes:   Exercise Prescription Changes     Row Name 10/21/23 1300 11/03/23 1300 11/17/23 1500 11/19/23 1000 12/01/23 1500     Response to Exercise   Blood Pressure (Admit) 108/60 144/62 104/70 -- 136/74   Blood Pressure (Exercise) -- 152/74 -- -- --   Blood Pressure (Exit) 128/72 132/70 122/70 -- 118/70   Heart Rate (Admit) 68 bpm 67 bpm 81 bpm -- 71 bpm   Heart Rate (Exercise) 84 bpm 96 bpm 104 bpm -- 93 bpm   Heart Rate (Exit) 78 bpm 71 bpm 83 bpm -- 78 bpm   Oxygen Saturation (Admit) 94 % 95 % -- -- --   Oxygen Saturation (Exercise) 92 % 96 % -- -- --   Oxygen Saturation (Exit) 94 % -- -- -- --   Rating of Perceived Exertion (Exercise) 11 11 12  -- 15   Perceived Dyspnea (Exercise) 1 -- -- -- --   Symptoms -- none -- -- --   Comments -- walk test results -- -- --   Duration Continue with 30 min of aerobic exercise without signs/symptoms of physical distress. -- Continue with 30 min of aerobic exercise without signs/symptoms of physical distress. -- Continue with 30 min of aerobic exercise without signs/symptoms of physical distress.   Intensity THRR unchanged -- THRR unchanged -- THRR unchanged     Progression   Progression Continue to progress workloads to maintain intensity without signs/symptoms of physical distress. -- Continue to progress workloads to maintain intensity without signs/symptoms of physical distress. -- Continue to progress workloads to maintain  intensity without signs/symptoms of physical distress.     Resistance Training   Training Prescription Yes -- Yes -- Yes   Weight 4 -- 4 -- 4   Reps 10-15 -- 10-15 -- 10-15     Treadmill   MPH 2.7 -- 2.5 -- 3.3   Grade 0 -- 2 -- 5.5   Minutes 15 -- 15 -- 15   METs 3.07 -- 3.6 -- 6.03     NuStep   Level 5 -- -- -- --   SPM 94 -- -- -- --   Minutes 15 -- -- -- --   METs 3 -- -- -- --     REL-XR   Level -- -- 5 -- 4   Speed -- -- 70 -- 46   Minutes -- -- 15 -- 15   METs -- -- 1.8 -- 3.7     Home Exercise Plan   Plans to continue exercise at -- -- -- Home (comment) Home (comment)   Frequency -- -- -- Add 3 additional days to program exercise sessions. Add 3 additional days to program exercise sessions.   Initial Home Exercises Provided -- -- -- 11/19/23 --      Exercise Comments:   Exercise Comments     Row Name 11/05/23 1028 11/19/23 1044         Exercise Comments First full day of exercise!  Patient was oriented to gym and  equipment including functions, settings, policies, and procedures.  Patient's individual exercise prescription and treatment plan were reviewed.  All starting workloads were established based on the results of the 6 minute walk test done at initial orientation visit.  The plan for exercise progression was also introduced and progression will be customized based on patient's performance and goals reviewed home exercise with pt         Exercise Goals and Review:   Exercise Goals Re-Evaluation :  Exercise Goals Re-Evaluation     Row Name 11/05/23 1029 11/24/23 0821           Exercise Goal Re-Evaluation   Exercise Goals Review Increase Physical Activity;Increase Strength and Stamina;Understanding of Exercise Prescription Increase Physical Activity;Increase Strength and Stamina;Understanding of Exercise Prescription      Comments Reviewed RPE and dyspnea scale, THR and program prescription with pt today.  Pt voiced understanding and was given a  copy of goals to take home. Ron is doing well in CR. He has continued to increase walking speed and level on the XR since changing from PR  to CR. Will continue to montior and progress as able      Expected Outcomes Short: Use RPE daily to regulate intensity.  Long: Follow program prescription in THR. Short: increase grade on treadmill   long: continue to exericse          Discharge Exercise Prescription (Final Exercise Prescription Changes):  Exercise Prescription Changes - 12/01/23 1500       Response to Exercise   Blood Pressure (Admit) 136/74    Blood Pressure (Exit) 118/70    Heart Rate (Admit) 71 bpm    Heart Rate (Exercise) 93 bpm    Heart Rate (Exit) 78 bpm    Rating of Perceived Exertion (Exercise) 15    Duration Continue with 30 min of aerobic exercise without signs/symptoms of physical distress.    Intensity THRR unchanged      Progression   Progression Continue to progress workloads to maintain intensity without signs/symptoms of physical distress.      Resistance Training   Training Prescription Yes    Weight 4    Reps 10-15      Treadmill   MPH 3.3    Grade 5.5    Minutes 15    METs 6.03      REL-XR   Level 4    Speed 46    Minutes 15    METs 3.7      Home Exercise Plan   Plans to continue exercise at Home (comment)    Frequency Add 3 additional days to program exercise sessions.          Nutrition:  Target Goals: Understanding of nutrition guidelines, daily intake of sodium 1500mg , cholesterol 200mg , calories 30% from fat and 7% or less from saturated fats, daily to have 5 or more servings of fruits and vegetables.  Biometrics:  Pre Biometrics - 11/03/23 1316       Pre Biometrics   Height 5' 7 (1.702 m)    Weight 181 lb 4.8 oz (82.2 kg)    Waist Circumference 38 inches    Hip Circumference 39 inches    Waist to Hip Ratio 0.97 %    BMI (Calculated) 28.39    Grip Strength 18.6 kg           Nutrition Therapy Plan and Nutrition  Goals:  Nutrition Therapy & Goals - 11/03/23 1325       Intervention Plan  Intervention Prescribe, educate and counsel regarding individualized specific dietary modifications aiming towards targeted core components such as weight, hypertension, lipid management, diabetes, heart failure and other comorbidities.;Nutrition handout(s) given to patient.    Expected Outcomes Short Term Goal: Understand basic principles of dietary content, such as calories, fat, sodium, cholesterol and nutrients.;Long Term Goal: Adherence to prescribed nutrition plan.          Nutrition Assessments:  MEDIFICTS Score Key: >=70 Need to make dietary changes  40-70 Heart Healthy Diet <= 40 Therapeutic Level Cholesterol Diet  Flowsheet Row CARDIAC REHAB PHASE II ORIENTATION from 11/03/2023 in [facility]  Picture Your Plate Total Score on Admission 68   Picture Your Plate Scores: <59 Unhealthy dietary pattern with much room for improvement. 41-50 Dietary pattern unlikely to meet recommendations for good health and room for improvement. 51-60 More healthful dietary pattern, with some room for improvement.  >60 Healthy dietary pattern, although there may be some specific behaviors that could be improved.    Nutrition Goals Re-Evaluation:   Nutrition Goals Discharge (Final Nutrition Goals Re-Evaluation):   Psychosocial: Target Goals: Acknowledge presence or absence of significant depression and/or stress, maximize coping skills, provide positive support system. Participant is able to verbalize types and ability to use techniques and skills needed for reducing stress and depression.  Initial Review & Psychosocial Screening:  Initial Psych Review & Screening - 11/03/23 1320       Initial Review   Current issues with Current Sleep Concerns;Current Psychotropic Meds;Current Stress Concerns    Source of Stress Concerns Chronic Illness;Unable to participate in former interests or  hobbies;Unable to perform yard/household activities    Comments new TAVR surgery and eager to return to rehab.      Family Dynamics   Good Support System? Yes   wife     Barriers   Psychosocial barriers to participate in program Psychosocial barriers identified (see note);The patient should benefit from training in stress management and relaxation.      Screening Interventions   Interventions Encouraged to exercise;To provide support and resources with identified psychosocial needs;Provide feedback about the scores to participant    Expected Outcomes Short Term goal: Utilizing psychosocial counselor, staff and physician to assist with identification of specific Stressors or current issues interfering with healing process. Setting desired goal for each stressor or current issue identified.;Long Term Goal: Stressors or current issues are controlled or eliminated.;Short Term goal: Identification and review with participant of any Quality of Life or Depression concerns found by scoring the questionnaire.;Long Term goal: The participant improves quality of Life and PHQ9 Scores as seen by post scores and/or verbalization of changes          Quality of Life Scores:  Quality of Life - 11/19/23 1048       Quality of Life   Select Quality of Life      Quality of Life Scores   Health/Function Pre 18.8 %    Socioeconomic Pre 29.25 %    Psych/Spiritual Pre 30 %    Family Pre 19.2 %    GLOBAL Pre 23.49 %         Scores of 19 and below usually indicate a poorer quality of life in these areas.  A difference of  2-3 points is a clinically meaningful difference.  A difference of 2-3 points in the total score of the Quality of Life Index has been associated with significant improvement in overall quality of life, self-image, physical symptoms, and general health  in studies assessing change in quality of life.  PHQ-9: Review Flowsheet  More data exists      11/03/2023 08/17/2023 04/21/2023 01/28/2023  10/21/2022  Depression screen PHQ 2/9  Decreased Interest 0 0 0 0 0  Down, Depressed, Hopeless 0 0 0 0 0  PHQ - 2 Score 0 0 0 0 0  Altered sleeping 0 3 0 - -  Tired, decreased energy 0 1 0 - -  Change in appetite 0 2 0 - -  Feeling bad or failure about yourself  0 0 0 - -  Trouble concentrating 0 0 0 - -  Moving slowly or fidgety/restless 0 0 0 - -  Suicidal thoughts 0 0 0 - -  PHQ-9 Score 0 6 0 - -  Difficult doing work/chores Somewhat difficult Somewhat difficult Not difficult at all - -   Interpretation of Total Score  Total Score Depression Severity:  1-4 = Minimal depression, 5-9 = Mild depression, 10-14 = Moderate depression, 15-19 = Moderately severe depression, 20-27 = Severe depression   Psychosocial Evaluation and Intervention:  Psychosocial Evaluation - 11/03/23 1321       Psychosocial Evaluation & Interventions   Interventions Stress management education;Relaxation education;Encouraged to exercise with the program and follow exercise prescription    Comments Ron is returning to cardiac rehab.  He was in pulmonary but had heart surgery and is now in cardiac rehab.  He attended today for orientation and we updated his information.  He is still on Lexapro  and feeling well managed.  He is sleeping little better than prior to surgery.  He is still not good with wearing his CPAP at night.  He is looking forward to getting back to rehab again as he noticed that it did make him feel better overall.    Expected Outcomes Short: Attend rehab to get moving again Long: Continue to stay positive    Continue Psychosocial Services  Follow up required by staff          Psychosocial Re-Evaluation:   Psychosocial Discharge (Final Psychosocial Re-Evaluation):   Vocational Rehabilitation: Provide vocational rehab assistance to qualifying candidates.   Vocational Rehab Evaluation & Intervention:  Vocational Rehab - 11/03/23 1326       Initial Vocational Rehab Evaluation &  Intervention   Assessment shows need for Vocational Rehabilitation No   retired         Education: Education Goals: Education classes will be provided on a weekly basis, covering required topics. Participant will state understanding/return demonstration of topics presented.  Learning Barriers/Preferences:  Learning Barriers/Preferences - 11/03/23 1326       Learning Barriers/Preferences   Learning Barriers None    Learning Preferences Skilled Demonstration;Verbal Instruction          Education Topics: Hypertension, Hypertension Reduction -Define heart disease and high blood pressure. Discus how high blood pressure affects the body and ways to reduce high blood pressure.   Exercise and Your Heart -Discuss why it is important to exercise, the FITT principles of exercise, normal and abnormal responses to exercise, and how to exercise safely.   Angina -Discuss definition of angina, causes of angina, treatment of angina, and how to decrease risk of having angina.   Cardiac Medications -Review what the following cardiac medications are used for, how they affect the body, and side effects that may occur when taking the medications.  Medications include Aspirin , Beta blockers, calcium  channel blockers, ACE Inhibitors, angiotensin receptor blockers, diuretics, digoxin, and antihyperlipidemics. Flowsheet Row PULMONARY REHAB  OTHER RESPIRATORY from 10/01/2023 in [facility]  Date 09/24/23  Educator [facility]  Instruction Review Code 1- Verbalizes Understanding    Congestive Heart Failure -Discuss the definition of CHF, how to live with CHF, the signs and symptoms of CHF, and how keep track of weight and sodium intake.   Heart Disease and Intimacy -Discus the effect sexual activity has on the heart, how changes occur during intimacy as we age, and safety during sexual activity. Flowsheet Row PULMONARY REHAB OTHER RESPIRATORY from 10/01/2023 in [facility] [facility]  Date 08/27/23  Educator jh  Instruction Review Code 1- Verbalizes Understanding    Smoking Cessation / COPD -Discuss different methods to quit smoking, the health benefits of quitting smoking, and the definition of COPD.   Nutrition I: Fats -Discuss the types of cholesterol, what cholesterol does to the heart, and how cholesterol levels can be controlled.   Nutrition II: Labels -Discuss the different components of food labels and how to read food label   Heart Parts/Heart Disease and PAD -Discuss the anatomy of the heart, the pathway of blood circulation through the heart, and these are affected by heart disease.   Stress I: Signs and Symptoms -Discuss the causes of stress, how stress may lead to anxiety and depression, and ways to limit stress. Flowsheet Row PULMONARY REHAB OTHER RESPIRATORY from 10/01/2023 in [facility]  Date 09/10/23  Educator [facility]  Instruction Review Code 1- Verbalizes Understanding    Stress II: Relaxation -Discuss different types of relaxation techniques to limit stress.   Warning Signs of Stroke / TIA -Discuss definition of a stroke, what the signs and symptoms are of a stroke, and how to identify when someone is having stroke.   Knowledge Questionnaire Score:  Knowledge Questionnaire Score - 11/19/23 1048       Knowledge Questionnaire Score   Pre Score 23/26          Core Components/Risk Factors/Patient Goals at Admission:  Personal Goals and Risk Factors at Admission - 11/03/23 1326       Core Components/Risk Factors/Patient Goals on Admission    Weight Management Weight Maintenance;Yes    Intervention Weight Management: Develop a combined nutrition and exercise program designed to reach desired caloric intake, while maintaining appropriate intake of nutrient and fiber, sodium and fats, and appropriate energy expenditure required for the weight goal.;Weight Management: Provide education and appropriate  resources to help participant work on and attain dietary goals.    Admit Weight 181 lb 4.8 oz (82.2 kg)    Goal Weight: Short Term 180 lb (81.6 kg)    Goal Weight: Long Term 180 lb (81.6 kg)    Expected Outcomes Short Term: Continue to assess and modify interventions until short term weight is achieved;Long Term: Adherence to nutrition and physical activity/exercise program aimed toward attainment of established weight goal;Weight Maintenance: Understanding of the daily nutrition guidelines, which includes 25-35% calories from fat, 7% or less cal from saturated fats, less than 200mg  cholesterol, less than 1.5gm of sodium, & 5 or more servings of fruits and vegetables daily    Improve shortness of breath with ADL's Yes    Intervention Provide education, individualized exercise plan and daily activity instruction to help decrease symptoms of SOB with activities of daily living.    Expected Outcomes Short Term: Improve cardiorespiratory fitness to achieve a reduction of symptoms when performing ADLs;Long Term: Be able to perform more ADLs without symptoms or delay the onset of symptoms  Heart Failure Yes    Intervention Provide a combined exercise and nutrition program that is supplemented with education, support and counseling about heart failure. Directed toward relieving symptoms such as shortness of breath, decreased exercise tolerance, and extremity edema.    Expected Outcomes Improve functional capacity of life;Short term: Daily weights obtained and reported for increase. Utilizing diuretic protocols set by physician.;Long term: Adoption of self-care skills and reduction of barriers for early signs and symptoms recognition and intervention leading to self-care maintenance.;Short term: Attendance in program 2-3 days a week with increased exercise capacity. Reported lower sodium intake. Reported increased fruit and vegetable intake. Reports medication compliance.    Hypertension Yes    Intervention  Provide education on lifestyle modifcations including regular physical activity/exercise, weight management, moderate sodium restriction and increased consumption of fresh fruit, vegetables, and low fat dairy, alcohol moderation, and smoking cessation.;Monitor prescription use compliance.    Expected Outcomes Short Term: Continued assessment and intervention until BP is < 140/60mm HG in hypertensive participants. < 130/42mm HG in hypertensive participants with diabetes, heart failure or chronic kidney disease.;Long Term: Maintenance of blood pressure at goal levels.    Lipids Yes    Intervention Provide education and support for participant on nutrition & aerobic/resistive exercise along with prescribed medications to achieve LDL 70mg , HDL >40mg .    Expected Outcomes Short Term: Participant states understanding of desired cholesterol values and is compliant with medications prescribed. Participant is following exercise prescription and nutrition guidelines.;Long Term: Cholesterol controlled with medications as prescribed, with individualized exercise RX and with personalized nutrition plan. Value goals: LDL < 70mg , HDL > 40 mg.          Core Components/Risk Factors/Patient Goals Review:    Core Components/Risk Factors/Patient Goals at Discharge (Final Review):    ITP Comments:  ITP Comments     Row Name 10/07/23 1147 11/02/23 0953 11/03/23 1247 11/04/23 1503 11/05/23 1028   ITP Comments 30 day review completed. ITP sent to Dr.Jehanzeb Memon, Medical Director of  Pulmonary Rehab. Continue with ITP unless changes are made by physician. 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 Discharging from [facility]. Patient attend orientation today.  Patient is attending Cardiac Rehabilitation Program.  Documentation for diagnosis can be found in [facility] 10/22/23.  Reviewed medical chart, RPE/RPD, gym safety, and program guidelines.  Patient was fitted to equipment they will be using during rehab.  Patient  is scheduled to start exercise on Thursday 11/05/23 at 1030.   Initial ITP created and sent for review and signature by Dr. Dorn Ross, Medical Director for Cardiac Rehabilitation Program. 30 day review completed. ITP sent to Dr. Dorn Ross, Medical Director of Cardiac Rehab. Continue with ITP unless changes are made by physician.  He just oriented for cardiac rehab yesterday. First full day of exercise!  Patient was oriented to gym and equipment including functions, settings, policies, and procedures.  Patient's individual exercise prescription and treatment plan were reviewed.  All starting workloads were established based on the results of the 6 minute walk test done at initial orientation visit.  The plan for exercise progression was also introduced and progression will be customized based on patient's performance and goals    Row Name 11/19/23 1044 12/02/23 1524         ITP Comments reviewed home exercise with pt 30 day review completed. ITP sent to Dr. Dorn Ross, Medical Director of Cardiac Rehab. Continue with ITP unless changes are made by physician.  Comments: 30 day review

## 2023-12-03 ENCOUNTER — Other Ambulatory Visit: Payer: Self-pay | Admitting: Family Medicine

## 2023-12-03 ENCOUNTER — Encounter (HOSPITAL_COMMUNITY)
Admission: RE | Admit: 2023-12-03 | Discharge: 2023-12-03 | Disposition: A | Discharge status: Home / Self Care | Source: Ambulatory Visit | Attending: Internal Medicine | Admitting: Internal Medicine

## 2023-12-03 ENCOUNTER — Encounter (HOSPITAL_COMMUNITY)

## 2023-12-03 DIAGNOSIS — Z952 Presence of prosthetic heart valve: Secondary | ICD-10-CM

## 2023-12-03 DIAGNOSIS — I5022 Chronic systolic (congestive) heart failure: Secondary | ICD-10-CM

## 2023-12-03 DIAGNOSIS — Z48812 Encounter for surgical aftercare following surgery on the circulatory system: Secondary | ICD-10-CM | POA: Diagnosis not present

## 2023-12-03 NOTE — Progress Notes (Signed)
 Daily Session Note  Patient Details  Name: Terry Pacheco MRN: [id number] Date of Birth: Oct 02, 1950 Referring Provider:   Flowsheet Row CARDIAC REHAB PHASE II ORIENTATION from 11/03/2023 in [facility] CARDIAC REHABILITATION  Referring Provider Barnet Cough MD    Encounter Date: 12/03/2023  Check In:  Session Check In - 12/03/23 1022       Check-In   Supervising physician immediately available to respond to emergencies See telemetry face sheet for immediately available MD    Location [facility]    Staff Present Powell Benders, BS, Exercise Physiologist;Debra Vicci, RN, BSN;Hillary Troutman BSN, RN    Virtual Visit No    Medication changes reported     No    Fall or balance concerns reported    No    Tobacco Cessation No Change    Warm-up and Cool-down Performed on first and last piece of equipment    Resistance Training Performed Yes    VAD Patient? No    PAD/SET Patient? No      Pain Assessment   Currently in Pain? No/denies    Multiple Pain Sites No          Capillary Blood Glucose: No results found for this or any previous visit (from the past 24 hours).    Social History   Tobacco Use  Smoking Status Former   Current packs/day: 0.00   Average packs/day: 0.5 packs/day for 45.0 years (22.5 ttl pk-yrs)   Types: Cigarettes   Start date: 01/12/1983   Quit date: 11/17/2016   Years since quitting: 7.0  Smokeless Tobacco Never    Goals Met:  Independence with exercise equipment Exercise tolerated well No report of concerns or symptoms today Strength training completed today  Goals Unmet:  Not Applicable  Comments: Pt able to follow exercise prescription today without complaint.  Will continue to monitor for progression.

## 2023-12-04 ENCOUNTER — Encounter: Payer: Self-pay | Admitting: Gastroenterology

## 2023-12-04 DIAGNOSIS — E785 Hyperlipidemia, unspecified: Secondary | ICD-10-CM | POA: Diagnosis not present

## 2023-12-04 DIAGNOSIS — Z23 Encounter for immunization: Secondary | ICD-10-CM | POA: Diagnosis not present

## 2023-12-04 DIAGNOSIS — I350 Nonrheumatic aortic (valve) stenosis: Secondary | ICD-10-CM | POA: Diagnosis not present

## 2023-12-04 DIAGNOSIS — K5730 Diverticulosis of large intestine without perforation or abscess without bleeding: Secondary | ICD-10-CM | POA: Diagnosis not present

## 2023-12-04 DIAGNOSIS — C099 Malignant neoplasm of tonsil, unspecified: Secondary | ICD-10-CM | POA: Diagnosis not present

## 2023-12-04 DIAGNOSIS — J449 Chronic obstructive pulmonary disease, unspecified: Secondary | ICD-10-CM | POA: Diagnosis not present

## 2023-12-04 DIAGNOSIS — I10 Essential (primary) hypertension: Secondary | ICD-10-CM | POA: Diagnosis not present

## 2023-12-08 ENCOUNTER — Encounter (HOSPITAL_COMMUNITY)

## 2023-12-08 ENCOUNTER — Encounter (HOSPITAL_COMMUNITY)
Admission: RE | Admit: 2023-12-08 | Discharge: 2023-12-08 | Disposition: A | Source: Ambulatory Visit | Attending: Internal Medicine

## 2023-12-08 DIAGNOSIS — Z952 Presence of prosthetic heart valve: Secondary | ICD-10-CM

## 2023-12-08 DIAGNOSIS — Z48812 Encounter for surgical aftercare following surgery on the circulatory system: Secondary | ICD-10-CM | POA: Diagnosis not present

## 2023-12-08 NOTE — Progress Notes (Signed)
 Daily Session Note  Patient Details  Name: DEVIN GANAWAY MRN: [id number] Date of Birth: 27-Nov-1950 Referring Provider:   Flowsheet Row CARDIAC REHAB PHASE II ORIENTATION from 11/03/2023 in [facility] CARDIAC REHABILITATION  Referring Provider Barnet Cough MD    Encounter Date: 12/08/2023  Check In:  Session Check In - 12/08/23 1015       Check-In   Supervising physician immediately available to respond to emergencies See telemetry face sheet for immediately available MD    Location [facility]    Staff Present Adrien Louder, RN, BSN;Brittany Jackquline, BSN, RN, WTA-C;Heather Con, BS, Exercise Physiologist    Virtual Visit No    Medication changes reported     No    Fall or balance concerns reported    No    Warm-up and Cool-down Performed on first and last piece of equipment    Resistance Training Performed Yes    VAD Patient? No    PAD/SET Patient? No      Pain Assessment   Currently in Pain? No/denies    Multiple Pain Sites No          Capillary Blood Glucose: No results found for this or any previous visit (from the past 24 hours).    Social History   Tobacco Use  Smoking Status Former   Current packs/day: 0.00   Average packs/day: 0.5 packs/day for 45.0 years (22.5 ttl pk-yrs)   Types: Cigarettes   Start date: 01/12/1983   Quit date: 11/17/2016   Years since quitting: 7.0  Smokeless Tobacco Never    Goals Met:  Independence with exercise equipment Exercise tolerated well No report of concerns or symptoms today Strength training completed today  Goals Unmet:  Not Applicable  Comments: Pt able to follow exercise prescription today without complaint.  Will continue to monitor for progression.

## 2023-12-09 DIAGNOSIS — E785 Hyperlipidemia, unspecified: Secondary | ICD-10-CM | POA: Diagnosis not present

## 2023-12-10 ENCOUNTER — Encounter (HOSPITAL_COMMUNITY)

## 2023-12-10 NOTE — Patient Instructions (Signed)

## 2023-12-10 NOTE — Progress Notes (Signed)
  Virtual Visit via Telephone Note  I connected with Terry Pacheco , 12/10/23 8:57 PM by a telemedicine application and verified that I am speaking with the correct person using two identifiers.  Location: Patient: home Provider: home   I discussed the limitations of evaluation and management by telemedicine and the availability of in person appointments. The patient expressed understanding and agreed to proceed.   Shared Decision Making Visit Lung Cancer Screening Program [phone])   Eligibility: 73 y.o. Pack Years Smoking History Calculation = 110 pack years  (# packs/per year x # years smoked) Recent History of coughing up blood  no Unexplained weight loss? no ( >Than 15 pounds within the last 6 months ) Prior History Lung / other cancer yes - throat - >5 yrs ago (Diagnosis within the last 5 years already requiring surveillance chest CT Scans). Smoking Status Former Smoker Former Smokers: Years since quit: < 1 year  Quit Date: 2025  Visit Components: Discussion included one or more decision making aids. YES Discussion included risk/benefits of screening. YES Discussion included potential follow up diagnostic testing for abnormal scans. YES Discussion included meaning and risk of over diagnosis. YES Discussion included meaning and risk of False Positives. YES Discussion included meaning of total radiation exposure. YES  Counseling Included: Importance of adherence to annual lung cancer LDCT screening. YES Impact of comorbidities on ability to participate in the program. YES Ability and willingness to under diagnostic treatment. YES  Smoking Cessation Counseling: Former Smokers:  Discussed the importance of maintaining cigarette abstinence. yes Diagnosis Code: Personal History of Nicotine Dependence. S12.108 Information about tobacco cessation classes and interventions provided to patient. Yes Patient provided with ticket for LDCT Scan. yes Written Order for Lung  Cancer Screening with LDCT placed in Epic. Yes (CT Chest Lung Cancer Screening Low Dose W/O CM) [id number]  Z12.2-Screening of respiratory organs Z87.891-Personal history of nicotine dependence   [name] 12/10/23

## 2023-12-11 ENCOUNTER — Ambulatory Visit: Admitting: Adult Health

## 2023-12-11 ENCOUNTER — Encounter: Payer: Self-pay | Admitting: Adult Health

## 2023-12-11 DIAGNOSIS — Z87891 Personal history of nicotine dependence: Secondary | ICD-10-CM | POA: Diagnosis not present

## 2023-12-15 ENCOUNTER — Encounter (HOSPITAL_COMMUNITY)

## 2023-12-15 ENCOUNTER — Ambulatory Visit (HOSPITAL_COMMUNITY)
Admission: RE | Admit: 2023-12-15 | Discharge: 2023-12-15 | Disposition: A | Discharge status: Home / Self Care | Source: Ambulatory Visit | Attending: Physician Assistant | Admitting: Physician Assistant

## 2023-12-15 ENCOUNTER — Encounter (HOSPITAL_COMMUNITY)
Admission: RE | Admit: 2023-12-15 | Discharge: 2023-12-15 | Disposition: A | Discharge status: Home / Self Care | Source: Ambulatory Visit | Attending: Internal Medicine | Admitting: Internal Medicine

## 2023-12-15 DIAGNOSIS — F17210 Nicotine dependence, cigarettes, uncomplicated: Secondary | ICD-10-CM | POA: Diagnosis not present

## 2023-12-15 DIAGNOSIS — I5022 Chronic systolic (congestive) heart failure: Secondary | ICD-10-CM

## 2023-12-15 DIAGNOSIS — Z952 Presence of prosthetic heart valve: Secondary | ICD-10-CM

## 2023-12-15 DIAGNOSIS — Z122 Encounter for screening for malignant neoplasm of respiratory organs: Secondary | ICD-10-CM | POA: Insufficient documentation

## 2023-12-15 DIAGNOSIS — Z87891 Personal history of nicotine dependence: Secondary | ICD-10-CM | POA: Insufficient documentation

## 2023-12-15 DIAGNOSIS — Z48812 Encounter for surgical aftercare following surgery on the circulatory system: Secondary | ICD-10-CM | POA: Diagnosis not present

## 2023-12-15 NOTE — Progress Notes (Signed)
 Daily Session Note  Patient Details  Name: Terry Pacheco MRN: [id number] Date of Birth: [birth year] Referring Provider:   Flowsheet Row CARDIAC REHAB PHASE II ORIENTATION from 11/03/2023 in [facility] CARDIAC REHABILITATION  Referring Provider Barnet Cough MD    Encounter Date: 12/15/2023  Check In:  Session Check In - 12/15/23 1017       Check-In   Supervising physician immediately available to respond to emergencies See telemetry face sheet for immediately available MD    Location [facility]    Staff Present Powell Benders, BS, Exercise Physiologist;Brittany Jackquline, BSN, RN, WTA-C;Ell Tiso Zina, RN    Virtual Visit No    Medication changes reported     No    Fall or balance concerns reported    No    Warm-up and Cool-down Performed on first and last piece of equipment    Resistance Training Performed Yes    VAD Patient? No    PAD/SET Patient? No      Pain Assessment   Currently in Pain? No/denies          Capillary Blood Glucose: No results found for this or any previous visit (from the past 24 hours).    Social History   Tobacco Use  Smoking Status Former   Current packs/day: 0.00   Average packs/day: 0.5 packs/day for 45.0 years (22.5 ttl pk-yrs)   Types: Cigarettes   Start date: 01/12/1983   Quit date: 11/17/2016   Years since quitting: 7.0  Smokeless Tobacco Never    Goals Met:  Independence with exercise equipment Exercise tolerated well No report of concerns or symptoms today Strength training completed today  Goals Unmet:  Not Applicable  Comments: Pt able to follow exercise prescription today without complaint.  Will continue to monitor for progression.

## 2023-12-17 ENCOUNTER — Encounter (HOSPITAL_COMMUNITY)
Admission: RE | Admit: 2023-12-17 | Discharge: 2023-12-17 | Disposition: A | Discharge status: Home / Self Care | Source: Ambulatory Visit | Attending: Internal Medicine | Admitting: Internal Medicine

## 2023-12-17 DIAGNOSIS — E785 Hyperlipidemia, unspecified: Secondary | ICD-10-CM | POA: Diagnosis not present

## 2023-12-17 DIAGNOSIS — C099 Malignant neoplasm of tonsil, unspecified: Secondary | ICD-10-CM | POA: Diagnosis not present

## 2023-12-17 DIAGNOSIS — N401 Enlarged prostate with lower urinary tract symptoms: Secondary | ICD-10-CM | POA: Diagnosis not present

## 2023-12-17 DIAGNOSIS — I509 Heart failure, unspecified: Secondary | ICD-10-CM | POA: Diagnosis not present

## 2023-12-17 DIAGNOSIS — I10 Essential (primary) hypertension: Secondary | ICD-10-CM | POA: Diagnosis not present

## 2023-12-17 DIAGNOSIS — J42 Unspecified chronic bronchitis: Secondary | ICD-10-CM | POA: Diagnosis not present

## 2023-12-17 DIAGNOSIS — E039 Hypothyroidism, unspecified: Secondary | ICD-10-CM | POA: Diagnosis not present

## 2023-12-17 DIAGNOSIS — I5022 Chronic systolic (congestive) heart failure: Secondary | ICD-10-CM

## 2023-12-17 DIAGNOSIS — I350 Nonrheumatic aortic (valve) stenosis: Secondary | ICD-10-CM | POA: Diagnosis not present

## 2023-12-17 DIAGNOSIS — I2510 Atherosclerotic heart disease of native coronary artery without angina pectoris: Secondary | ICD-10-CM | POA: Diagnosis not present

## 2023-12-17 DIAGNOSIS — Z48812 Encounter for surgical aftercare following surgery on the circulatory system: Secondary | ICD-10-CM | POA: Diagnosis not present

## 2023-12-17 DIAGNOSIS — Z952 Presence of prosthetic heart valve: Secondary | ICD-10-CM

## 2023-12-17 DIAGNOSIS — F411 Generalized anxiety disorder: Secondary | ICD-10-CM | POA: Diagnosis not present

## 2023-12-17 DIAGNOSIS — Q676 Pectus excavatum: Secondary | ICD-10-CM | POA: Diagnosis not present

## 2023-12-17 DIAGNOSIS — K219 Gastro-esophageal reflux disease without esophagitis: Secondary | ICD-10-CM | POA: Diagnosis not present

## 2023-12-17 NOTE — Progress Notes (Signed)
 Daily Session Note  Patient Details  Name: Terry Pacheco MRN: [id number] Date of Birth: 09-18-1950 Referring Provider:   Flowsheet Row CARDIAC REHAB PHASE II ORIENTATION from 11/03/2023 in [facility] CARDIAC REHABILITATION  Referring Provider Barnet Cough MD    Encounter Date: 12/17/2023  Check In:  Session Check In - 12/17/23 1134       Check-In   Supervising physician immediately available to respond to emergencies See telemetry face sheet for immediately available MD    Location [facility]    Staff Present Powell Benders, BS, Exercise Physiologist;Ashante Snelling Zina, RN;Jessica Eschbach, MA, RCEP, CCRP, CCET    Virtual Visit No    Medication changes reported     No    Fall or balance concerns reported    No    Warm-up and Cool-down Performed on first and last piece of equipment    Resistance Training Performed Yes    VAD Patient? No    PAD/SET Patient? No      Pain Assessment   Currently in Pain? No/denies          Capillary Blood Glucose: No results found for this or any previous visit (from the past 24 hours).    Social History   Tobacco Use  Smoking Status Former   Current packs/day: 0.00   Average packs/day: 0.5 packs/day for 45.0 years (22.5 ttl pk-yrs)   Types: Cigarettes   Start date: 01/12/1983   Quit date: 11/17/2016   Years since quitting: 7.0  Smokeless Tobacco Never    Goals Met:  Independence with exercise equipment Exercise tolerated well No report of concerns or symptoms today Strength training completed today  Goals Unmet:  Not Applicable  Comments: Pt able to follow exercise prescription today without complaint.  Will continue to monitor for progression.

## 2023-12-21 ENCOUNTER — Other Ambulatory Visit: Payer: Self-pay

## 2023-12-21 DIAGNOSIS — Z122 Encounter for screening for malignant neoplasm of respiratory organs: Secondary | ICD-10-CM

## 2023-12-21 DIAGNOSIS — Z87891 Personal history of nicotine dependence: Secondary | ICD-10-CM

## 2023-12-22 ENCOUNTER — Encounter (HOSPITAL_COMMUNITY)
Admission: RE | Admit: 2023-12-22 | Discharge: 2023-12-22 | Disposition: A | Discharge status: Home / Self Care | Source: Ambulatory Visit | Attending: Internal Medicine | Admitting: Internal Medicine

## 2023-12-22 DIAGNOSIS — Z952 Presence of prosthetic heart valve: Secondary | ICD-10-CM

## 2023-12-22 DIAGNOSIS — I5022 Chronic systolic (congestive) heart failure: Secondary | ICD-10-CM

## 2023-12-22 DIAGNOSIS — Z48812 Encounter for surgical aftercare following surgery on the circulatory system: Secondary | ICD-10-CM | POA: Diagnosis not present

## 2023-12-22 NOTE — Progress Notes (Signed)
 Daily Session Note  Patient Details  Name: Terry Pacheco MRN: [id number] Date of Birth: April 23, 1950 Referring Provider:   Flowsheet Row CARDIAC REHAB PHASE II ORIENTATION from 11/03/2023 in [facility] CARDIAC REHABILITATION  Referring Provider Barnet Cough MD    Encounter Date: 12/22/2023  Check In:  Session Check In - 12/22/23 1026       Check-In   Supervising physician immediately available to respond to emergencies See telemetry face sheet for immediately available MD    Location [facility]    Staff Present Laymon Rattler, BSN, RN, WTA-C;Heather Con, BS, Exercise Physiologist;Victoria Zina, RN    Virtual Visit No    Medication changes reported     No    Fall or balance concerns reported    No    Tobacco Cessation No Change    Warm-up and Cool-down Performed on first and last piece of equipment    Resistance Training Performed Yes    VAD Patient? No    PAD/SET Patient? No      Pain Assessment   Currently in Pain? No/denies          Capillary Blood Glucose: No results found for this or any previous visit (from the past 24 hours).    Social History   Tobacco Use  Smoking Status Former   Current packs/day: 0.00   Average packs/day: 0.5 packs/day for 45.0 years (22.5 ttl pk-yrs)   Types: Cigarettes   Start date: 01/12/1983   Quit date: 11/17/2016   Years since quitting: 7.0  Smokeless Tobacco Never    Goals Met:  Independence with exercise equipment Exercise tolerated well No report of concerns or symptoms today Strength training completed today  Goals Unmet:  Not Applicable  Comments: Pt able to follow exercise prescription today without complaint.  Will continue to monitor for progression.

## 2023-12-24 ENCOUNTER — Other Ambulatory Visit: Payer: Self-pay | Admitting: Family Medicine

## 2023-12-24 ENCOUNTER — Encounter (HOSPITAL_COMMUNITY)

## 2023-12-24 DIAGNOSIS — N401 Enlarged prostate with lower urinary tract symptoms: Secondary | ICD-10-CM

## 2023-12-25 ENCOUNTER — Encounter (HOSPITAL_COMMUNITY)
Admission: RE | Admit: 2023-12-25 | Discharge: 2023-12-25 | Disposition: A | Discharge status: Home / Self Care | Source: Ambulatory Visit | Attending: Physician Assistant | Admitting: Physician Assistant

## 2023-12-25 DIAGNOSIS — Z952 Presence of prosthetic heart valve: Secondary | ICD-10-CM

## 2023-12-25 DIAGNOSIS — Z48812 Encounter for surgical aftercare following surgery on the circulatory system: Secondary | ICD-10-CM | POA: Diagnosis not present

## 2023-12-25 NOTE — Progress Notes (Signed)
 Daily Session Note  Patient Details  Name: Terry Pacheco MRN: [id number] Date of Birth: [birth year] Referring Provider:   Flowsheet Row CARDIAC REHAB PHASE II ORIENTATION from 11/03/2023 in [facility] CARDIAC REHABILITATION  Referring Provider Barnet Cough MD    Encounter Date: 12/25/2023  Check In:  Session Check In - 12/25/23 1037       Check-In   Supervising physician immediately available to respond to emergencies See telemetry face sheet for immediately available MD    Location [facility]    Staff Present Laymon Rattler, BSN, RN, Rosalba Gelineau, MA, RCEP, CCRP, CCET    Virtual Visit No    Medication changes reported     No    Fall or balance concerns reported    No    Tobacco Cessation No Change    Warm-up and Cool-down Performed on first and last piece of equipment    Resistance Training Performed Yes    VAD Patient? No    PAD/SET Patient? No      Pain Assessment   Currently in Pain? No/denies          Capillary Blood Glucose: No results found for this or any previous visit (from the past 24 hours).    Social History   Tobacco Use  Smoking Status Former   Current packs/day: 0.00   Average packs/day: 0.5 packs/day for 45.0 years (22.5 ttl pk-yrs)   Types: Cigarettes   Start date: 01/12/1983   Quit date: 11/17/2016   Years since quitting: 7.1  Smokeless Tobacco Never    Goals Met:  Independence with exercise equipment Exercise tolerated well No report of concerns or symptoms today Strength training completed today  Goals Unmet:  Not Applicable  Comments: Pt able to follow exercise prescription today without complaint.  Will continue to monitor for progression.

## 2023-12-29 ENCOUNTER — Encounter (HOSPITAL_COMMUNITY)
Admission: RE | Admit: 2023-12-29 | Discharge: 2023-12-29 | Disposition: A | Discharge status: Home / Self Care | Source: Ambulatory Visit | Attending: Internal Medicine | Admitting: Internal Medicine

## 2023-12-29 DIAGNOSIS — I5022 Chronic systolic (congestive) heart failure: Secondary | ICD-10-CM | POA: Insufficient documentation

## 2023-12-29 DIAGNOSIS — Z952 Presence of prosthetic heart valve: Secondary | ICD-10-CM | POA: Diagnosis not present

## 2023-12-29 NOTE — Progress Notes (Signed)
 Daily Session Note  Patient Details  Name: Terry Pacheco MRN: [id number] Date of Birth: 1950-07-14 Referring Provider:   Flowsheet Row CARDIAC REHAB PHASE II ORIENTATION from 11/03/2023 in [facility] CARDIAC REHABILITATION  Referring Provider Barnet Cough MD    Encounter Date: 12/29/2023  Check In:  Session Check In - 12/29/23 1007       Check-In   Supervising physician immediately available to respond to emergencies See telemetry face sheet for immediately available MD    Location [facility]    Staff Present Laymon Rattler, BSN, RN, Rosalba Gelineau, MA, RCEP, CCRP, CCET;Victoria [place], RN    Virtual Visit No    Medication changes reported     No    Fall or balance concerns reported    No    Tobacco Cessation No Change    Warm-up and Cool-down Performed on first and last piece of equipment    Resistance Training Performed Yes    VAD Patient? No    PAD/SET Patient? No      Pain Assessment   Currently in Pain? No/denies          Capillary Blood Glucose: No results found for this or any previous visit (from the past 24 hours).    Social History   Tobacco Use  Smoking Status Former   Current packs/day: 0.00   Average packs/day: 0.5 packs/day for 45.0 years (22.5 ttl pk-yrs)   Types: Cigarettes   Start date: 01/12/1983   Quit date: 11/17/2016   Years since quitting: 7.1  Smokeless Tobacco Never    Goals Met:  Independence with exercise equipment Exercise tolerated well No report of concerns or symptoms today Strength training completed today  Goals Unmet:  Not Applicable  Comments: Pt able to follow exercise prescription today without complaint.  Will continue to monitor for progression.

## 2023-12-30 ENCOUNTER — Encounter (HOSPITAL_COMMUNITY): Payer: Self-pay

## 2023-12-30 DIAGNOSIS — Z952 Presence of prosthetic heart valve: Secondary | ICD-10-CM

## 2023-12-30 NOTE — Progress Notes (Signed)
 Cardiac Individual Treatment Plan  Patient Details  Name: Terry Pacheco MRN: [id number] Date of Birth: [birth year] Referring Provider:   Flowsheet Row CARDIAC REHAB PHASE II ORIENTATION from 11/03/2023 in [place] [facility]  Referring Provider Barnet Cough MD    Initial Encounter Date:  Flowsheet Row CARDIAC REHAB PHASE II ORIENTATION from 11/03/2023 in [place] [facility] [facility]  Date 11/03/23    Visit Diagnosis: S/P TAVR (transcatheter aortic valve replacement)  Patient's Home Medications on Admission:  Current Outpatient Medications:    acetaminophen  (TYLENOL ) 500 MG tablet, Take 1,000 mg by mouth daily as needed for moderate pain or headache., Disp: , Rfl:    albuterol  (ACCUNEB ) 1.25 MG/3ML nebulizer solution, Take 3 mLs (1.25 mg total) by nebulization every 6 (six) hours as needed for wheezing., Disp: 75 mL, Rfl: 12   amoxicillin  (AMOXIL ) 500 MG tablet, Take 2,000 mg by mouth once. Take 4 tablets one time before dental procedure, Disp: , Rfl:    aspirin  EC 81 MG tablet, Take 81 mg by mouth daily., Disp: , Rfl:    atorvastatin  (LIPITOR) 40 MG tablet, Take 1 tablet (40 mg total) by mouth daily., Disp: 90 tablet, Rfl: 3   B Complex CAPS, TAKE 1 CAPSULE BY MOUTH EVERY DAY, Disp: 100 capsule, Rfl: 2   calcium  carbonate (TUMS - DOSED IN MG ELEMENTAL CALCIUM ) 500 MG chewable tablet, Chew 2 tablets by mouth daily as needed for indigestion or heartburn., Disp: , Rfl:    empagliflozin (JARDIANCE) 10 MG TABS tablet, Take 1 tablet by mouth daily., Disp: , Rfl:    escitalopram  (LEXAPRO ) 20 MG tablet, TAKE 1 TABLET BY MOUTH EVERY DAY, Disp: 90 tablet, Rfl: 0   ezetimibe  (ZETIA ) 10 MG tablet, Take 1 tablet (10 mg total) by mouth daily., Disp: 90 tablet, Rfl: 0   fluticasone  furoate-vilanterol (BREO ELLIPTA ) 200-25 MCG/ACT AEPB, Inhale 1 puff into the lungs daily., Disp: 1 each, Rfl: 11   Glucosamine 500 MG CAPS, Take by mouth., Disp: , Rfl:    metoprolol succinate  (TOPROL-XL) 25 MG 24 hr tablet, Take 25 mg by mouth daily., Disp: , Rfl:    omeprazole  (PRILOSEC) 20 MG capsule, TAKE 1 CAPSULE BY MOUTH EVERY DAY, Disp: 90 capsule, Rfl: 3   sacubitril-valsartan (ENTRESTO) 24-26 MG, Take 1 tablet by mouth 2 (two) times daily., Disp: , Rfl:    silodosin (RAPAFLO) 8 MG CAPS capsule, Take 8 mg by mouth daily with breakfast., Disp: , Rfl:    spironolactone (ALDACTONE) 25 MG tablet, Take 12.5 mg by mouth daily., Disp: , Rfl:    Vitamin D , Ergocalciferol , (DRISDOL ) 1.25 MG (50000 UNIT) CAPS capsule, TAKE 1 CAPSULE (50,000 UNITS TOTAL) BY MOUTH EVERY 7 (SEVEN) DAYS, Disp: 13 capsule, Rfl: 3  Past Medical History: Past Medical History:  Diagnosis Date   Aortic stenosis    Mild to moderate by echo 11/2016   Arthritis    Cancer (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    Hypertension    Sleep apnea    does not use CPAP   Wears glasses     Tobacco Use: Social History   Tobacco Use  Smoking  Status Former   Current packs/day: 0.00   Average packs/day: 0.5 packs/day for 45.0 years (22.5 ttl pk-yrs)   Types: Cigarettes   Start date: 01/12/1983   Quit date: 11/17/2016   Years since quitting: 7.1  Smokeless Tobacco Never    Labs: Review Flowsheet  More data exists      Latest Ref Rng & Units 04/18/2021 10/21/2021 04/22/2022 10/21/2022 04/21/2023  Labs for ITP Cardiac and Pulmonary Rehab  Cholestrol 100 - 199 mg/dL 892  889  880  882  895   LDL (calc) 0 - 99 mg/dL 49  45  49  44  44   HDL-C >39 mg/dL 44  54  58  61  47   Trlycerides 0 - 149 mg/dL 65  39  51  51  56      Exercise Target Goals: Exercise  Program Goal: Individual exercise prescription set using results from initial 6 min walk test and THRR while considering  patient's activity barriers and safety.   Exercise Prescription Goal: Initial exercise prescription builds to 30-45 minutes a day of aerobic activity, 2-3 days per week.  Home exercise guidelines will be given to patient during program as part of exercise prescription that the participant will acknowledge.   Education: Aerobic Exercise: - Group verbal and visual presentation on the components of exercise prescription. Introduces F.I.T.T principle from ACSM for exercise prescriptions.  Reviews F.I.T.T. principles of aerobic exercise including progression. Written material provided at class time. Flowsheet Row CARDIAC REHAB PHASE II EXERCISE from 12/17/2023 in [place]  Education need identified 11/19/23    Education: Resistance Exercise: - Group verbal and visual presentation on the components of exercise prescription. Introduces F.I.T.T principle from ACSM for exercise prescriptions  Reviews F.I.T.T. principles of resistance exercise including progression. Written material provided at class time.    Education: Exercise & Equipment Safety: - Individual verbal instruction and demonstration of equipment use and safety with use of the equipment.   Education: Exercise Physiology & General Exercise Guidelines: - Group verbal and written instruction with models to review the exercise physiology of the cardiovascular system and associated critical values. Provides general exercise guidelines with specific guidelines to those with heart or lung disease. Written material provided at class time. Flowsheet Row CARDIAC REHAB PHASE II EXERCISE from 12/17/2023 in [place] [facility]  Date 10/08/23  Educator hb  Instruction Review Code 2- Demonstrated Understanding    Education: Flexibility, Balance, Mind/Body Relaxation: - Group verbal and  visual presentation with interactive activity on the components of exercise prescription. Introduces F.I.T.T principle from ACSM for exercise prescriptions. Reviews F.I.T.T. principles of flexibility and balance exercise training including progression. Also discusses the mind body connection.  Reviews various relaxation techniques to help reduce and manage stress (i.e. Deep breathing, progressive muscle relaxation, and visualization). Balance handout provided to take home. Written material provided at class time.   Activity Barriers & Risk Stratification:  Activity Barriers & Cardiac Risk Stratification - 11/03/23 1031       Activity Barriers & Cardiac Risk Stratification   Activity Barriers Shortness of Breath;Deconditioning;Muscular Weakness;Right Hip Replacement;Arthritis;Balance Concerns    Cardiac Risk Stratification Moderate          6 Minute Walk:  6 Minute Walk     Row Name 11/03/23 1257 12/08/23 1040       6 Minute Walk   Phase Initial Discharge    Distance 1230 feet --    Walk Time 6 minutes --    #  of Rest Breaks 0 --    MPH 2.33 --    METS 2.67 --    RPE 11 --    VO2 Peak 9.36 --    Symptoms No --    Resting HR 67 bpm --    Resting BP 144/62 --    Resting Oxygen Saturation  96 % --    Exercise Oxygen Saturation  during 6 min walk 96 % --    Max Ex. HR 96 bpm --    Max Ex. BP 154/74 --    2 Minute Post BP 132/70 --       Oxygen Initial Assessment:   Oxygen Re-Evaluation:   Oxygen Discharge (Final Oxygen Re-Evaluation):   Initial Exercise Prescription:  Initial Exercise Prescription - 11/03/23 1300       Date of Initial Exercise RX and Referring Provider   Date 11/03/23    Referring Provider Barnet Cough MD      Oxygen   Maintain Oxygen Saturation 88% or higher      Treadmill   MPH 2.2    Grade 1    Minutes 15    METs 2.99      REL-XR   Level 3    Speed 50    Minutes 15    METs 3      Prescription Details   Frequency (times per  week) 2    Duration Progress to 30 minutes of continuous aerobic without signs/symptoms of physical distress      Intensity   THRR 40-80% of Max Heartrate 99-131    Ratings of Perceived Exertion 11-13    Perceived Dyspnea 0-4      Progression   Progression Continue to progress workloads to maintain intensity without signs/symptoms of physical distress.      Resistance Training   Training Prescription Yes    Weight 4 lb    Reps 10-15          Perform Capillary Blood Glucose checks as needed.  Exercise Prescription Changes:   Exercise Prescription Changes     Row Name 11/03/23 1300 11/17/23 1500 11/19/23 1000 12/01/23 1500 12/22/23 1300     Response to Exercise   Blood Pressure (Admit) 144/62 104/70 -- 136/74 138/70   Blood Pressure (Exercise) 152/74 -- -- -- --   Blood Pressure (Exit) 132/70 122/70 -- 118/70 128/70   Heart Rate (Admit) 67 bpm 81 bpm -- 71 bpm 70 bpm   Heart Rate (Exercise) 96 bpm 104 bpm -- 93 bpm 124 bpm   Heart Rate (Exit) 71 bpm 83 bpm -- 78 bpm 112 bpm   Oxygen Saturation (Admit) 95 % -- -- -- --   Oxygen Saturation (Exercise) 96 % -- -- -- --   Rating of Perceived Exertion (Exercise) 11 12 -- 15 15   Symptoms none -- -- -- --   Comments walk test results -- -- -- --   Duration -- Continue with 30 min of aerobic exercise without signs/symptoms of physical distress. -- Continue with 30 min of aerobic exercise without signs/symptoms of physical distress. Continue with 30 min of aerobic exercise without signs/symptoms of physical distress.   Intensity -- THRR unchanged -- THRR unchanged THRR unchanged     Progression   Progression -- Continue to progress workloads to maintain intensity without signs/symptoms of physical distress. -- Continue to progress workloads to maintain intensity without signs/symptoms of physical distress. Continue to progress workloads to maintain intensity without signs/symptoms of physical distress.  Resistance Training    Training Prescription -- Yes -- Yes Yes   Weight -- 4 -- 4 4   Reps -- 10-15 -- 10-15 10-15     Treadmill   MPH -- 2.5 -- 3.3 2.7   Grade -- 2 -- 5.5 2   Minutes -- 15 -- 15 15   METs -- 3.6 -- 6.03 3.81     REL-XR   Level -- 5 -- 4 4   Speed -- 70 -- 46 48   Minutes -- 15 -- 15 15   METs -- 1.8 -- 3.7 3     Home Exercise Plan   Plans to continue exercise at -- -- Home (comment) Home (comment) Home (comment)   Frequency -- -- Add 3 additional days to program exercise sessions. Add 3 additional days to program exercise sessions. Add 3 additional days to program exercise sessions.   Initial Home Exercises Provided -- -- 11/19/23 -- --      Exercise Comments:   Exercise Comments     Row Name 11/05/23 1028 11/19/23 1044         Exercise Comments First full day of exercise!  Patient was oriented to gym and equipment including functions, settings, policies, and procedures.  Patient's individual exercise prescription and treatment plan were reviewed.  All starting workloads were established based on the results of the 6 minute walk test done at initial orientation visit.  The plan for exercise progression was also introduced and progression will be customized based on patient's performance and goals reviewed home exercise with pt         Exercise Goals and Review:   Exercise Goals Re-Evaluation :  Exercise Goals Re-Evaluation     Row Name 11/05/23 1029 11/24/23 0821 12/17/23 1147         Exercise Goal Re-Evaluation   Exercise Goals Review Increase Physical Activity;Increase Strength and Stamina;Understanding of Exercise Prescription Increase Physical Activity;Increase Strength and Stamina;Understanding of Exercise Prescription Increase Physical Activity;Increase Strength and Stamina;Able to understand and use rate of perceived exertion (RPE) scale;Able to understand and use Dyspnea scale;Able to check pulse independently;Knowledge and understanding of Target Heart Rate Range  (THRR);Understanding of Exercise Prescription     Comments Reviewed RPE and dyspnea scale, THR and program prescription with pt today.  Pt voiced understanding and was given a copy of goals to take home. Terry Pacheco is doing well in CR. He has continued to increase walking speed and level on the XR since changing from PR  to CR. Will continue to montior and progress as able Terry Pacheco is doing great in rehab! Terry Pacheco [place] for exercise. He notes is a long walk and has hills also.     Expected Outcomes Short: Use RPE daily to regulate intensity.  Long: Follow program prescription in THR. Short: increase grade on treadmill   long: continue to exericse Short: continue to attend rehab. Long: continue to exercise daily for at least 30 minutes.        Discharge Exercise Prescription (Final Exercise Prescription Changes):  Exercise Prescription Changes - 12/22/23 1300       Response to Exercise   Blood Pressure (Admit) 138/70    Blood Pressure (Exit) 128/70    Heart Rate (Admit) 70 bpm    Heart Rate (Exercise) 124 bpm    Heart Rate (Exit) 112 bpm    Rating of Perceived Exertion (Exercise) 15    Duration Continue with 30 min of aerobic exercise without signs/symptoms  of physical distress.    Intensity THRR unchanged      Progression   Progression Continue to progress workloads to maintain intensity without signs/symptoms of physical distress.      Resistance Training   Training Prescription Yes    Weight 4    Reps 10-15      Treadmill   MPH 2.7    Grade 2    Minutes 15    METs 3.81      REL-XR   Level 4    Speed 48    Minutes 15    METs 3      Home Exercise Plan   Plans to continue exercise at Home (comment)    Frequency Add 3 additional days to program exercise sessions.          Nutrition:  Target Goals: Understanding of nutrition guidelines, daily intake of sodium 1500mg , cholesterol 200mg , calories 30% from fat and 7% or less from saturated fats, daily to have 5  or more servings of fruits and vegetables.  Education: Nutrition 1 -Group instruction provided by verbal, written material, interactive activities, discussions, models, and posters to present general guidelines for heart healthy nutrition including macronutrients, label reading, and promoting whole foods over processed counterparts. Education serves as pensions consultant of discussion of heart healthy eating for all. Written material provided at class time. Flowsheet Row CARDIAC REHAB PHASE II EXERCISE from 12/17/2023 in [place] [facility]  Education need identified 11/19/23  Date 11/05/23  Educator jh  Instruction Review Code 2- Demonstrated Understanding     Education: Nutrition 2 -Group instruction provided by verbal, written material, interactive activities, discussions, models, and posters to present general guidelines for heart healthy nutrition including sodium, cholesterol, and saturated fat. Providing guidance of habit forming to improve blood pressure, cholesterol, and body weight. Written material provided at class time. Flowsheet Row CARDIAC REHAB PHASE II EXERCISE from 12/17/2023 in [place] [facility]  Education need identified 11/19/23  Date 11/05/23  Educator jh  Instruction Review Code 2- Demonstrated Understanding      Biometrics:  Pre Biometrics - 11/03/23 1316       Pre Biometrics   Height 5' 7 (1.702 m)    Weight 181 lb 4.8 oz (82.2 kg)    Waist Circumference 38 inches    Hip Circumference 39 inches    Waist to Hip Ratio 0.97 %    BMI (Calculated) 28.39    Grip Strength 18.6 kg           Nutrition Therapy Plan and Nutrition Goals:  Nutrition Therapy & Goals - 11/03/23 1325       Intervention Plan   Intervention Prescribe, educate and counsel regarding individualized specific dietary modifications aiming towards targeted core components such as weight, hypertension, lipid management, diabetes, heart failure and other  comorbidities.;Nutrition handout(s) given to patient.    Expected Outcomes Short Term Goal: Understand basic principles of dietary content, such as calories, fat, sodium, cholesterol and nutrients.;Long Term Goal: Adherence to prescribed nutrition plan.          Nutrition Assessments:  MEDIFICTS Score Key: >=70 Need to make dietary changes  40-70 Heart Healthy Diet <= 40 Therapeutic Level Cholesterol Diet  Flowsheet Row CARDIAC REHAB PHASE II ORIENTATION from 11/03/2023 in [facility]  Picture Your Plate Total Score on Admission 68   Picture Your Plate Scores: <59 Unhealthy dietary pattern with much room for improvement. 41-50 Dietary pattern unlikely to meet recommendations for good health and room  for improvement. 51-60 More healthful dietary pattern, with some room for improvement.  >60 Healthy dietary pattern, although there may be some specific behaviors that could be improved.    Nutrition Goals Re-Evaluation:  Nutrition Goals Re-Evaluation     Row Name 12/17/23 1139             Goals   Nutrition Goal healthy eating       Comment Terry Pacheco's appetite is much much better. He notes gaining more weight back because he is eating more, which he feels much better. He notes incorporating more protein also, and notes he doesn't really have to watch his salt, because his sodium has actually ran low.       Expected Outcome Short: Continue to eat more protein. Long: continue eating healthy, well balanced meals.          Nutrition Goals Discharge (Final Nutrition Goals Re-Evaluation):  Nutrition Goals Re-Evaluation - 12/17/23 1139       Goals   Nutrition Goal healthy eating    Comment Terry Pacheco's appetite is much much better. He notes gaining more weight back because he is eating more, which he feels much better. He notes incorporating more protein also, and notes he doesn't really have to watch his salt, because his sodium has actually ran low.    Expected  Outcome Short: Continue to eat more protein. Long: continue eating healthy, well balanced meals.          Psychosocial: Target Goals: Acknowledge presence or absence of significant depression and/or stress, maximize coping skills, provide positive support system. Participant is able to verbalize types and ability to use techniques and skills needed for reducing stress and depression.   Education: Stress, Anxiety, and Depression - Group verbal and visual presentation to define topics covered.  Reviews how body is impacted by stress, anxiety, and depression.  Also discusses healthy ways to reduce stress and to treat/manage anxiety and depression. Written material provided at class time. Flowsheet Row CARDIAC REHAB PHASE II EXERCISE from 12/17/2023 in [place] [facility]  Date 11/12/23  Educator HB  Instruction Review Code 1- [employer] Understanding    Education: Sleep Hygiene -Provides group verbal and written instruction about how sleep can affect your health.  Define sleep hygiene, discuss sleep cycles and impact of sleep habits. Review good sleep hygiene tips.   Initial Review & Psychosocial Screening:  Initial Psych Review & Screening - 11/03/23 1320       Initial Review   Current issues with Current Sleep Concerns;Current Psychotropic Meds;Current Stress Concerns    Source of Stress Concerns Chronic Illness;Unable to participate in former interests or hobbies;Unable to perform yard/household activities    Comments new TAVR surgery and eager to return to rehab.      Family Dynamics   Good Support System? Yes   wife     Barriers   Psychosocial barriers to participate in program Psychosocial barriers identified (see note);The patient should benefit from training in stress management and relaxation.      Screening Interventions   Interventions Encouraged to exercise;To provide support and resources with identified psychosocial needs;Provide feedback about the scores  to participant    Expected Outcomes Short Term goal: Utilizing psychosocial counselor, staff and physician to assist with identification of specific Stressors or current issues interfering with healing process. Setting desired goal for each stressor or current issue identified.;Long Term Goal: Stressors or current issues are controlled or eliminated.;Short Term goal: Identification and review with participant of any Quality of  Life or Depression concerns found by scoring the questionnaire.;Long Term goal: The participant improves quality of Life and PHQ9 Scores as seen by post scores and/or verbalization of changes          Quality of Life Scores:   Quality of Life - 11/19/23 1048       Quality of Life   Select Quality of Life      Quality of Life Scores   Health/Function Pre 18.8 %    Socioeconomic Pre 29.25 %    Psych/Spiritual Pre 30 %    Family Pre 19.2 %    GLOBAL Pre 23.49 %         Scores of 19 and below usually indicate a poorer quality of life in these areas.  A difference of  2-3 points is a clinically meaningful difference.  A difference of 2-3 points in the total score of the Quality of Life Index has been associated with significant improvement in overall quality of life, self-image, physical symptoms, and general health in studies assessing change in quality of life.  PHQ-9: Review Flowsheet  More data exists      11/03/2023 08/17/2023 04/21/2023 01/28/2023 10/21/2022  Depression screen PHQ 2/9  Decreased Interest 0 0 0 0 0  Down, Depressed, Hopeless 0 0 0 0 0  PHQ - 2 Score 0 0 0 0 0  Altered sleeping 0 3 0 - -  Tired, decreased energy 0 1 0 - -  Change in appetite 0 2 0 - -  Feeling bad or failure about yourself  0 0 0 - -  Trouble concentrating 0 0 0 - -  Moving slowly or fidgety/restless 0 0 0 - -  Suicidal thoughts 0 0 0 - -  PHQ-9 Score 0 6 0 - -  Difficult doing work/chores Somewhat difficult Somewhat difficult Not difficult at all - -   Interpretation of  Total Score  Total Score Depression Severity:  1-4 = Minimal depression, 5-9 = Mild depression, 10-14 = Moderate depression, 15-19 = Moderately severe depression, 20-27 = Severe depression   Psychosocial Evaluation and Intervention:  Psychosocial Evaluation - 11/03/23 1321       Psychosocial Evaluation & Interventions   Interventions Stress management education;Relaxation education;Encouraged to exercise with the program and follow exercise prescription    Comments Terry Pacheco is returning to cardiac rehab.  He was in pulmonary but had heart surgery and is now in cardiac rehab.  He attended today for orientation and we updated his information.  He is still on Lexapro  and feeling well managed.  He is sleeping little better than prior to surgery.  He is still not good with wearing his CPAP at night.  He is looking forward to getting back to rehab again as he noticed that it did make him feel better overall.    Expected Outcomes Short: Attend rehab to get moving again Long: Continue to stay positive    Continue Psychosocial Services  Follow up required by staff          Psychosocial Re-Evaluation:  Psychosocial Re-Evaluation     Row Name 12/17/23 1137             Psychosocial Re-Evaluation   Current issues with Current Sleep Concerns       Comments Terry Pacheco doesn't complain of any stressors. He notes his sleep is much better, he is still sleeping with his HOB elevated and he says that helps him so much, he doesn't think he could ever sleep flat.  Expected Outcomes Short: montior sleep if it gets worse contact MD    long: continue to have no stressors       Interventions Encouraged to attend Cardiac Rehabilitation for the exercise       Continue Psychosocial Services  Follow up required by staff          Psychosocial Discharge (Final Psychosocial Re-Evaluation):  Psychosocial Re-Evaluation - 12/17/23 1137       Psychosocial Re-Evaluation   Current issues with Current Sleep Concerns     Comments Terry Pacheco doesn't complain of any stressors. He notes his sleep is much better, he is still sleeping with his HOB elevated and he says that helps him so much, he doesn't think he could ever sleep flat.    Expected Outcomes Short: montior sleep if it gets worse contact MD    long: continue to have no stressors    Interventions Encouraged to attend Cardiac Rehabilitation for the exercise    Continue Psychosocial Services  Follow up required by staff          Vocational Rehabilitation: Provide vocational rehab assistance to qualifying candidates.   Vocational Rehab Evaluation & Intervention:  Vocational Rehab - 11/03/23 1326       Initial Vocational Rehab Evaluation & Intervention   Assessment shows need for Vocational Rehabilitation No   retired         Education: Education Goals: Education classes will be provided on a variety of topics geared toward better understanding of heart health and risk factor modification. Participant will state understanding/return demonstration of topics presented as noted by education test scores.  Learning Barriers/Preferences:  Learning Barriers/Preferences - 11/03/23 1326       Learning Barriers/Preferences   Learning Barriers None    Learning Preferences Skilled Demonstration;Verbal Instruction          General Cardiac Education Topics:  AED/CPR: - Group verbal and written instruction with the use of models to demonstrate the basic use of the AED with the basic ABC's of resuscitation.   Test and Procedures: - Group verbal and visual presentation and models provide information about basic cardiac anatomy and function. Reviews the testing methods done to diagnose heart disease and the outcomes of the test results. Describes the treatment choices: Medical Management, Angioplasty, or Coronary Bypass Surgery for treating various heart conditions including Myocardial Infarction, Angina, Valve Disease, and Cardiac Arrhythmias. Written material  provided at class time. Flowsheet Row CARDIAC REHAB PHASE II EXERCISE from 12/17/2023 in [facility]  Date 10/15/23  Educator [facility]  Instruction Review Code 2- Demonstrated Understanding    Medication Safety: - Group verbal and visual instruction to review commonly prescribed medications for heart and lung disease. Reviews the medication, class of the drug, and side effects. Includes the steps to properly store meds and maintain the prescription regimen. Written material provided at class time. Flowsheet Row CARDIAC REHAB PHASE II EXERCISE from 12/17/2023 in [facility] [place] [facility]  Date 11/26/23  Educator [facility]  Instruction Review Code 1- Verbalizes Understanding    Intimacy: - Group verbal instruction through game format to discuss how heart and lung disease can affect sexual intimacy. Written material provided at class time.   Know Your Numbers and Heart Failure: - Group verbal and visual instruction to discuss disease risk factors for cardiac and pulmonary disease and treatment options.  Reviews associated critical values for Overweight/Obesity, Hypertension, Cholesterol, and Diabetes.  Discusses basics of heart failure: signs/symptoms and treatments.  Introduces Heart Failure Zone chart for  action plan for heart failure. Written material provided at class time. Flowsheet Row CARDIAC REHAB PHASE II EXERCISE from 12/17/2023 in [facility]  Instruction Review Code 1- Verbalizes Understanding    Infection Prevention: - Provides verbal and written material to individual with discussion of infection control including proper hand washing and proper equipment cleaning during exercise session.   Falls Prevention: - Provides verbal and written material to individual with discussion of falls prevention and safety.   Other: -Provides group and verbal instruction on various topics (see comments)   Knowledge Questionnaire Score:  Knowledge  Questionnaire Score - 11/19/23 1048       Knowledge Questionnaire Score   Pre Score 23/26          Core Components/Risk Factors/Patient Goals at Admission:  Personal Goals and Risk Factors at Admission - 11/03/23 1326       Core Components/Risk Factors/Patient Goals on Admission    Weight Management Weight Maintenance;Yes    Intervention Weight Management: Develop a combined nutrition and exercise program designed to reach desired caloric intake, while maintaining appropriate intake of nutrient and fiber, sodium and fats, and appropriate energy expenditure required for the weight goal.;Weight Management: Provide education and appropriate resources to help participant work on and attain dietary goals.    Admit Weight 181 lb 4.8 oz (82.2 kg)    Goal Weight: Short Term 180 lb (81.6 kg)    Goal Weight: Long Term 180 lb (81.6 kg)    Expected Outcomes Short Term: Continue to assess and modify interventions until short term weight is achieved;Long Term: Adherence to nutrition and physical activity/exercise program aimed toward attainment of established weight goal;Weight Maintenance: Understanding of the daily nutrition guidelines, which includes 25-35% calories from fat, 7% or less cal from saturated fats, less than 200mg  cholesterol, less than 1.5gm of sodium, & 5 or more servings of fruits and vegetables daily    Improve shortness of breath with ADL's Yes    Intervention Provide education, individualized exercise plan and daily activity instruction to help decrease symptoms of SOB with activities of daily living.    Expected Outcomes Short Term: Improve cardiorespiratory fitness to achieve a reduction of symptoms when performing ADLs;Long Term: Be able to perform more ADLs without symptoms or delay the onset of symptoms    Heart Failure Yes    Intervention Provide a combined exercise and nutrition program that is supplemented with education, support and counseling about heart failure. Directed  toward relieving symptoms such as shortness of breath, decreased exercise tolerance, and extremity edema.    Expected Outcomes Improve functional capacity of life;Short term: Daily weights obtained and reported for increase. Utilizing diuretic protocols set by physician.;Long term: Adoption of self-care skills and reduction of barriers for early signs and symptoms recognition and intervention leading to self-care maintenance.;Short term: Attendance in program 2-3 days a week with increased exercise capacity. Reported lower sodium intake. Reported increased fruit and vegetable intake. Reports medication compliance.    Hypertension Yes    Intervention Provide education on lifestyle modifcations including regular physical activity/exercise, weight management, moderate sodium restriction and increased consumption of fresh fruit, vegetables, and low fat dairy, alcohol moderation, and smoking cessation.;Monitor prescription use compliance.    Expected Outcomes Short Term: Continued assessment and intervention until BP is < 140/16mm HG in hypertensive participants. < 130/27mm HG in hypertensive participants with diabetes, heart failure or chronic kidney disease.;Long Term: Maintenance of blood pressure at goal levels.    Lipids Yes    Intervention  Provide education and support for participant on nutrition & aerobic/resistive exercise along with prescribed medications to achieve LDL 70mg , HDL >40mg .    Expected Outcomes Short Term: Participant states understanding of desired cholesterol values and is compliant with medications prescribed. Participant is following exercise prescription and nutrition guidelines.;Long Term: Cholesterol controlled with medications as prescribed, with individualized exercise RX and with personalized nutrition plan. Value goals: LDL < 70mg , HDL > 40 mg.          Education:Diabetes - Individual verbal and written instruction to review signs/symptoms of diabetes, desired ranges of  glucose level fasting, after meals and with exercise. Acknowledge that pre and post exercise glucose checks will be done for 3 sessions at entry of program.   Core Components/Risk Factors/Patient Goals Review:   Goals and Risk Factor Review     Row Name 12/17/23 1142             Core Components/Risk Factors/Patient Goals Review   Personal Goals Review Weight Management/Obesity;Improve shortness of breath with ADL's       Review Terry Pacheco notes no changes in medications, and the meds he takes are working great. He is checking his blood pressure daily at home and they are looking good.       Expected Outcomes Short: Continue to take medications as prescribed by MD. Long: Keep exercising for overall happiness.          Core Components/Risk Factors/Patient Goals at Discharge (Final Review):   Goals and Risk Factor Review - 12/17/23 1142       Core Components/Risk Factors/Patient Goals Review   Personal Goals Review Weight Management/Obesity;Improve shortness of breath with ADL's    Review Terry Pacheco notes no changes in medications, and the meds he takes are working great. He is checking his blood pressure daily at home and they are looking good.    Expected Outcomes Short: Continue to take medications as prescribed by MD. Long: Keep exercising for overall happiness.          ITP Comments:  ITP Comments     Row Name 11/03/23 1247 11/04/23 1503 11/05/23 1028 11/19/23 1044 12/02/23 1524   ITP Comments Patient attend orientation today.  Patient is attending Cardiac Rehabilitation Program.  Documentation for diagnosis can be found in [facility] 10/22/23.  Reviewed medical chart, RPE/RPD, gym safety, and program guidelines.  Patient was fitted to equipment they will be using during rehab.  Patient is scheduled to start exercise on Thursday 11/05/23 at 1030.   Initial ITP created and sent for review and signature by Dr. Dorn Ross, Medical Director for Cardiac Rehabilitation Program. 30 day review  completed. ITP sent to Dr. Dorn Ross, Medical Director of Cardiac Rehab. Continue with ITP unless changes are made by physician.  He just oriented for cardiac rehab yesterday. First full day of exercise!  Patient was oriented to gym and equipment including functions, settings, policies, and procedures.  Patient's individual exercise prescription and treatment plan were reviewed.  All starting workloads were established based on the results of the 6 minute walk test done at initial orientation visit.  The plan for exercise progression was also introduced and progression will be customized based on patient's performance and goals reviewed home exercise with pt 30 day review completed. ITP sent to Dr. Dorn Ross, Medical Director of Cardiac Rehab. Continue with ITP unless changes are made by physician.    Row Name 12/30/23 0850           ITP Comments 30 day review  completed. ITP sent to Dr. Dorn Ross, Medical Director of Cardiac Rehab. Continue with ITP unless changes are made by physician.          Comments: 30 day review

## 2023-12-31 ENCOUNTER — Encounter (HOSPITAL_COMMUNITY)
Admission: RE | Admit: 2023-12-31 | Discharge: 2023-12-31 | Disposition: A | Discharge status: Home / Self Care | Source: Ambulatory Visit | Attending: Internal Medicine | Admitting: Internal Medicine

## 2023-12-31 DIAGNOSIS — Z952 Presence of prosthetic heart valve: Secondary | ICD-10-CM | POA: Diagnosis not present

## 2023-12-31 NOTE — Progress Notes (Signed)
 Daily Session Note  Patient Details  Name: Terry Pacheco MRN: [id number] Date of Birth: August 04, 1950 Referring Provider:   Flowsheet Row CARDIAC REHAB PHASE II ORIENTATION from 11/03/2023 in [facility]  Referring Provider Barnet Cough MD    Encounter Date: 12/31/2023  Check In:  Session Check In - 12/31/23 1039       Check-In   Supervising physician immediately available to respond to emergencies See telemetry face sheet for immediately available MD    Location [facility]    Staff Present Powell Benders, BS, Exercise Physiologist;Debra Vicci, RN, BSN;Micco Bourbeau BSN, RN    Virtual Visit No    Medication changes reported     No    Fall or balance concerns reported    No    Tobacco Cessation No Change    Warm-up and Cool-down Performed on first and last piece of equipment    Resistance Training Performed Yes    VAD Patient? No    PAD/SET Patient? No      Pain Assessment   Currently in Pain? No/denies    Multiple Pain Sites No          Capillary Blood Glucose: No results found for this or any previous visit (from the past 24 hours).    Social History   Tobacco Use  Smoking Status Former   Current packs/day: 0.00   Average packs/day: 0.5 packs/day for 45.0 years (22.5 ttl pk-yrs)   Types: Cigarettes   Start date: 01/12/1983   Quit date: 11/17/2016   Years since quitting: 7.1  Smokeless Tobacco Never    Goals Met:  Independence with exercise equipment Exercise tolerated well No report of concerns or symptoms today Strength training completed today  Goals Unmet:  Not Applicable  Comments: SABRASABRAPt able to follow exercise prescription today without complaint.  Will continue to monitor for progression.

## 2024-01-04 ENCOUNTER — Telehealth: Payer: Self-pay

## 2024-01-04 NOTE — Telephone Encounter (Signed)
 John, Please review this patient's most recent ECHO results and advise if patient is appropriate for [facility] procedure.    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  Please/thank you Bre, PV RN

## 2024-01-05 ENCOUNTER — Encounter (HOSPITAL_COMMUNITY)
Admission: RE | Admit: 2024-01-05 | Discharge: 2024-01-05 | Disposition: A | Source: Ambulatory Visit | Attending: Internal Medicine

## 2024-01-05 DIAGNOSIS — I5022 Chronic systolic (congestive) heart failure: Secondary | ICD-10-CM

## 2024-01-05 DIAGNOSIS — Z952 Presence of prosthetic heart valve: Secondary | ICD-10-CM

## 2024-01-05 NOTE — Telephone Encounter (Signed)
 noted on PV chart

## 2024-01-05 NOTE — Progress Notes (Signed)
 Daily Session Note  Patient Details  Name: BRAIN HONEYCUTT MRN: [id number] Date of Birth: 08-Dec-1950 Referring Provider:   Flowsheet Row CARDIAC REHAB PHASE II ORIENTATION from 11/03/2023 in [facility]IAC RE[facility]  Referring Provider Barnet Cough MD    Encounter Date: 01/05/2024  Check In:  Session Check In - 01/05/24 1024       Check-In   Supervising physician immediately available to respond to emergencies See telemetry face sheet for immediately available MD    Location [facility]    Staff Present Powell Benders, BS, Exercise Physiologist;Yenesis Even Zina, RN;Brittany Jackquline, BSN, RN, WTA-C    Virtual Visit No    Medication changes reported     No    Fall or balance concerns reported    No    Warm-up and Cool-down Performed on first and last piece of equipment    Resistance Training Performed Yes    VAD Patient? No    PAD/SET Patient? No      Pain Assessment   Currently in Pain? No/denies          Capillary Blood Glucose: No results found for this or any previous visit (from the past 24 hours).    Social History   Tobacco Use  Smoking Status Former   Current packs/day: 0.00   Average packs/day: 0.5 packs/day for 45.0 years (22.5 ttl pk-yrs)   Types: Cigarettes   Start date: 01/12/1983   Quit date: 11/17/2016   Years since quitting: 7.1  Smokeless Tobacco Never    Goals Met:  Independence with exercise equipment Exercise tolerated well No report of concerns or symptoms today Strength training completed today  Goals Unmet:  Not Applicable  Comments: Pt able to follow exercise prescription today without complaint.  Will continue to monitor for progression.

## 2024-01-05 NOTE — Telephone Encounter (Signed)
 Information to be noted on PV chart---

## 2024-01-07 ENCOUNTER — Encounter (HOSPITAL_COMMUNITY)
Admission: RE | Admit: 2024-01-07 | Discharge: 2024-01-07 | Disposition: A | Discharge status: Home / Self Care | Source: Ambulatory Visit | Attending: Internal Medicine | Admitting: Internal Medicine

## 2024-01-07 DIAGNOSIS — Z952 Presence of prosthetic heart valve: Secondary | ICD-10-CM | POA: Diagnosis not present

## 2024-01-07 DIAGNOSIS — I5022 Chronic systolic (congestive) heart failure: Secondary | ICD-10-CM

## 2024-01-07 NOTE — Progress Notes (Signed)
 Daily Session Note  Patient Details  Name: NASHAUN HILLMER MRN: [id number] Date of Birth: [birth year] Referring Provider:   Flowsheet Row CARDIAC REHAB PHASE II ORIENTATION from 11/03/2023 in [facility] C[facility]AC RE[facility]  Referring Provider Barnet Cough MD    Encounter Date: 01/07/2024  Check In:  Session Check In - 01/07/24 1031       Check-In   Supervising physician immediately available to respond to emergencies See telemetry face sheet for immediately available MD    Location [facility]    Staff Present Powell Benders, BS, Exercise Physiologist;Hillary Dean BSN, RN    Virtual Visit No    Medication changes reported     No    Fall or balance concerns reported    No    Tobacco Cessation No Change    Warm-up and Cool-down Performed on first and last piece of equipment    Resistance Training Performed Yes    VAD Patient? No    PAD/SET Patient? No      Pain Assessment   Multiple Pain Sites No          Capillary Blood Glucose: No results found for this or any previous visit (from the past 24 hours).    Social History   Tobacco Use  Smoking Status Former   Current packs/day: 0.00   Average packs/day: 0.5 packs/day for 45.0 years (22.5 ttl pk-yrs)   Types: Cigarettes   Start date: 01/12/1983   Quit date: 11/17/2016   Years since quitting: 7.1  Smokeless Tobacco Never    Goals Met:  Independence with exercise equipment Exercise tolerated well No report of concerns or symptoms today Strength training completed today  Goals Unmet:  Not Applicable  Comments: Pt able to follow exercise prescription today without complaint.  Will continue to monitor for progression.

## 2024-01-12 ENCOUNTER — Encounter (HOSPITAL_COMMUNITY)
Admission: RE | Admit: 2024-01-12 | Discharge: 2024-01-12 | Disposition: A | Discharge status: Home / Self Care | Source: Ambulatory Visit | Attending: Internal Medicine | Admitting: Internal Medicine

## 2024-01-12 ENCOUNTER — Ambulatory Visit (AMBULATORY_SURGERY_CENTER): Admitting: *Deleted

## 2024-01-12 VITALS — Ht 69.0 in | Wt 186.0 lb

## 2024-01-12 DIAGNOSIS — Z952 Presence of prosthetic heart valve: Secondary | ICD-10-CM | POA: Diagnosis not present

## 2024-01-12 DIAGNOSIS — Z860100 Personal history of colon polyps, unspecified: Secondary | ICD-10-CM

## 2024-01-12 DIAGNOSIS — I5022 Chronic systolic (congestive) heart failure: Secondary | ICD-10-CM

## 2024-01-12 MED ORDER — PEG 3350-KCL-NA BICARB-NACL 420 G PO SOLR: 4000.0000 mL | Freq: Once | ORAL | 0 refills | Status: AC

## 2024-01-12 NOTE — Progress Notes (Signed)
 Daily Session Note  Patient Details  Name: HOLSTON OYAMA MRN: [id number] Date of Birth: 07-01-1950 Referring Provider:   Flowsheet Row CARDIAC REHAB PHASE II ORIENTATION from 11/03/2023 in [facility] C[facility]IAC REH[facility]  Referring Provider Barnet Cough MD    Encounter Date: 01/12/2024  Check In:  Session Check In - 01/12/24 1022       Check-In   Supervising physician immediately available to respond to emergencies See telemetry face sheet for immediately available MD    Location [facility]    Staff Present Powell Benders, BS, Exercise Physiologist;Brittany Jackquline, BSN, RN, WTA-C;Dayvin Aber Zina, RN    Virtual Visit No    Medication changes reported     No    Fall or balance concerns reported    No    Warm-up and Cool-down Performed on first and last piece of equipment    Resistance Training Performed Yes    VAD Patient? No    PAD/SET Patient? No      Pain Assessment   Currently in Pain? No/denies          Capillary Blood Glucose: No results found for this or any previous visit (from the past 24 hours).    Social History   Tobacco Use  Smoking Status Former   Current packs/day: 0.00   Average packs/day: 0.5 packs/day for 45.0 years (22.5 ttl pk-yrs)   Types: Cigarettes   Start date: 01/12/1983   Quit date: 11/17/2016   Years since quitting: 7.1  Smokeless Tobacco Never    Goals Met:  Independence with exercise equipment Exercise tolerated well No report of concerns or symptoms today Strength training completed today  Goals Unmet:  Not Applicable  Comments: Pt able to follow exercise prescription today without complaint.  Will continue to monitor for progression.

## 2024-01-12 NOTE — Progress Notes (Signed)
 Pt's name and DOB verified at the beginning of the pre-visit with 2 identifiers   Pt denies any difficulty with ambulating,sitting, laying down or rolling side to side  Pt has no issues moving head neck or swallowing  No egg or soy allergy known to patient   No issues known to pt with past sedation  No FH of Malignant Hyperthermia  Pt is not on home 02   Pt is not on blood thinners   Pt denies issues with constipation   Pt is not on dialysis  Pt denise any abnormal heart rhythms but has heart murmur  Pt denies any upcoming cardiac testing  Patient's chart reviewed by Norleen Schillings CNRA prior to pre-visit and patient appropriate for the [facility].  Pre-visit completed and red dot placed by patient's name on their procedure day (on provider's schedule).    Visit in person  Pt states weight 186 lb  Pt given  both [facility] main # and MD on call # prior to instructions.  Informed pt to come in at the time discussed and is shown on PV instructions.  Pt instructed to use [employer] or GoodRx for a price reduction on prep  Instructed pt where to find PV instructions in My [facility]. Copy of instructions  GIVEN TO PT Instructed pt on all aspects of written instructions including med holds clothing to wear and foods to eat and not eat as well as after procedure legal restrictions and to call MD on call if needed.. Pt states understanding. Instructed pt to review instructions again prior to procedure and call main # given if has any questions or any issues. Pt states they will.

## 2024-01-14 ENCOUNTER — Encounter (HOSPITAL_COMMUNITY)
Admission: RE | Admit: 2024-01-14 | Discharge: 2024-01-14 | Disposition: A | Source: Ambulatory Visit | Attending: Internal Medicine

## 2024-01-14 DIAGNOSIS — E039 Hypothyroidism, unspecified: Secondary | ICD-10-CM | POA: Diagnosis not present

## 2024-01-14 DIAGNOSIS — K219 Gastro-esophageal reflux disease without esophagitis: Secondary | ICD-10-CM | POA: Diagnosis not present

## 2024-01-14 DIAGNOSIS — I10 Essential (primary) hypertension: Secondary | ICD-10-CM | POA: Diagnosis not present

## 2024-01-14 DIAGNOSIS — J42 Unspecified chronic bronchitis: Secondary | ICD-10-CM | POA: Diagnosis not present

## 2024-01-14 DIAGNOSIS — Q676 Pectus excavatum: Secondary | ICD-10-CM | POA: Diagnosis not present

## 2024-01-14 DIAGNOSIS — C099 Malignant neoplasm of tonsil, unspecified: Secondary | ICD-10-CM | POA: Diagnosis not present

## 2024-01-14 DIAGNOSIS — N401 Enlarged prostate with lower urinary tract symptoms: Secondary | ICD-10-CM | POA: Diagnosis not present

## 2024-01-14 DIAGNOSIS — Z952 Presence of prosthetic heart valve: Secondary | ICD-10-CM

## 2024-01-14 DIAGNOSIS — F411 Generalized anxiety disorder: Secondary | ICD-10-CM | POA: Diagnosis not present

## 2024-01-14 DIAGNOSIS — E785 Hyperlipidemia, unspecified: Secondary | ICD-10-CM | POA: Diagnosis not present

## 2024-01-14 DIAGNOSIS — I350 Nonrheumatic aortic (valve) stenosis: Secondary | ICD-10-CM | POA: Diagnosis not present

## 2024-01-14 DIAGNOSIS — I2510 Atherosclerotic heart disease of native coronary artery without angina pectoris: Secondary | ICD-10-CM | POA: Diagnosis not present

## 2024-01-14 DIAGNOSIS — I509 Heart failure, unspecified: Secondary | ICD-10-CM | POA: Diagnosis not present

## 2024-01-14 NOTE — Progress Notes (Signed)
 Daily Session Note  Patient Details  Name: Terry Pacheco MRN: [id number] Date of Birth: 1950-11-20 Referring Provider:   Flowsheet Row CARDIAC REHAB PHASE II ORIENTATION from 11/03/2023 in [facility] CARDIAC REHABILIT[facility]  Referring Provider Barnet Cough MD    Encounter Date: 01/14/2024  Check In:  Session Check In - 01/14/24 1028       Check-In   Supervising physician immediately available to respond to emergencies See telemetry face sheet for immediately available MD    Location [facility]    Staff Present Harlene Gelineau, MA, RCEP, CCRP, CCET;Heather Con HECKLE, Exercise Physiologist;Shalay Carder Dean BSN, RN    Virtual Visit No    Medication changes reported     No    Fall or balance concerns reported    No    Tobacco Cessation No Change    Warm-up and Cool-down Performed on first and last piece of equipment    Resistance Training Performed Yes    VAD Patient? No    PAD/SET Patient? No      Pain Assessment   Currently in Pain? No/denies    Multiple Pain Sites No          Capillary Blood Glucose: No results found for this or any previous visit (from the past 24 hours).    Social History   Tobacco Use  Smoking Status Former   Current packs/day: 0.00   Average packs/day: 0.5 packs/day for 45.0 years (22.5 ttl pk-yrs)   Types: Cigarettes   Start date: 01/12/1983   Quit date: 11/17/2016   Years since quitting: 7.1  Smokeless Tobacco Never    Goals Met:  Independence with exercise equipment Exercise tolerated well No report of concerns or symptoms today Strength training completed today  Goals Unmet:  Not Applicable  Comments: SABRASABRAPt able to follow exercise prescription today without complaint.  Will continue to monitor for progression.

## 2024-01-19 ENCOUNTER — Encounter (HOSPITAL_COMMUNITY)
Admission: RE | Admit: 2024-01-19 | Discharge: 2024-01-19 | Disposition: A | Discharge status: Home / Self Care | Source: Ambulatory Visit | Attending: Internal Medicine | Admitting: Internal Medicine

## 2024-01-19 DIAGNOSIS — I5022 Chronic systolic (congestive) heart failure: Secondary | ICD-10-CM

## 2024-01-19 DIAGNOSIS — Z952 Presence of prosthetic heart valve: Secondary | ICD-10-CM

## 2024-01-19 NOTE — Progress Notes (Signed)
 Daily Session Note  Patient Details  Name: COLLIS THEDE MRN: [id number] Date of Birth: 1950/11/18 Referring Provider:   Flowsheet Row CARDIAC REHAB PHASE II ORIENTATION from 11/03/2023 in [facility] CARDIAC REHABILITATION  Referring Provider Barnet Cough MD    Encounter Date: 01/19/2024  Check In:  Session Check In - 01/19/24 1022       Check-In   Supervising physician immediately available to respond to emergencies See telemetry face sheet for immediately available MD    Location [facility]    Staff Present Powell Benders, BS, Exercise Physiologist;Brittany Jackquline, BSN, RN, WTA-C;Kynnedi Zweig Zina, RN    Virtual Visit No    Medication changes reported     No    Fall or balance concerns reported    No    Warm-up and Cool-down Performed on first and last piece of equipment    Resistance Training Performed Yes    VAD Patient? No    PAD/SET Patient? No      Pain Assessment   Currently in Pain? No/denies          Capillary Blood Glucose: No results found for this or any previous visit (from the past 24 hours).    Social History   Tobacco Use  Smoking Status Former   Current packs/day: 0.00   Average packs/day: 0.5 packs/day for 45.0 years (22.5 ttl pk-yrs)   Types: Cigarettes   Start date: 01/12/1983   Quit date: 11/17/2016   Years since quitting: 7.1  Smokeless Tobacco Never    Goals Met:  Independence with exercise equipment Exercise tolerated well No report of concerns or symptoms today Strength training completed today  Goals Unmet:  Not Applicable  Comments: Pt able to follow exercise prescription today without complaint.  Will continue to monitor for progression.

## 2024-01-26 ENCOUNTER — Encounter (HOSPITAL_COMMUNITY)
Admission: RE | Admit: 2024-01-26 | Discharge: 2024-01-26 | Disposition: A | Source: Ambulatory Visit | Attending: Internal Medicine

## 2024-01-26 ENCOUNTER — Encounter (HOSPITAL_COMMUNITY): Payer: Self-pay | Admitting: *Deleted

## 2024-01-26 DIAGNOSIS — Z952 Presence of prosthetic heart valve: Secondary | ICD-10-CM

## 2024-01-26 DIAGNOSIS — I5022 Chronic systolic (congestive) heart failure: Secondary | ICD-10-CM | POA: Diagnosis present

## 2024-01-26 NOTE — Progress Notes (Signed)
 Cardiac Individual Treatment Plan  Patient Details  Name: Terry Pacheco MRN: [id number] Date of Birth: 10-Jul-1950 Referring Provider:   Flowsheet Row CARDIAC REHAB PHASE II ORIENTATION from 11/03/2023 in [facility]  Referring Provider Barnet Cough MD    Initial Encounter Date:  Flowsheet Row CARDIAC REHAB PHASE II ORIENTATION from 11/03/2023 in [facility]  Date 11/03/23    Visit Diagnosis: S/P TAVR (transcatheter aortic valve replacement)  Patient's Home Medications on Admission:  Current Outpatient Medications:    acetaminophen  (TYLENOL ) 500 MG tablet, Take 1,000 mg by mouth daily as needed for moderate pain or headache., Disp: , Rfl:    albuterol  (ACCUNEB ) 1.25 MG/3ML nebulizer solution, Take 3 mLs (1.25 mg total) by nebulization every 6 (six) hours as needed for wheezing., Disp: 75 mL, Rfl: 12   amLODipine  (NORVASC ) 10 MG tablet, TAKE 1 TABLET EVERY DAY FOR FOR BLOOD PRESSURE, Disp: , Rfl:    amoxicillin  (AMOXIL ) 500 MG tablet, Take 2,000 mg by mouth once. Take 4 tablets one time before dental procedure (Patient not taking: Reported on 01/12/2024), Disp: , Rfl:    aspirin  EC 81 MG tablet, Take 81 mg by mouth daily., Disp: , Rfl:    atorvastatin  (LIPITOR) 40 MG tablet, Take 1 tablet (40 mg total) by mouth daily., Disp: 90 tablet, Rfl: 3   B Complex CAPS, TAKE 1 CAPSULE BY MOUTH EVERY DAY, Disp: 100 capsule, Rfl: 2   calcium  carbonate (TUMS - DOSED IN MG ELEMENTAL CALCIUM ) 500 MG chewable tablet, Chew 2 tablets by mouth daily as needed for indigestion or heartburn., Disp: , Rfl:    escitalopram  (LEXAPRO ) 20 MG tablet, TAKE 1 TABLET BY MOUTH EVERY DAY, Disp: 90 tablet, Rfl: 0   ezetimibe  (ZETIA ) 10 MG tablet, Take 1 tablet (10 mg total) by mouth daily., Disp: 90 tablet, Rfl: 0   fluticasone  furoate-vilanterol (BREO ELLIPTA ) 200-25 MCG/ACT AEPB, Inhale 1 puff into the lungs daily., Disp: 1 each, Rfl: 11   Glucosamine 500 MG CAPS, Take by  mouth., Disp: , Rfl:    metoprolol succinate (TOPROL-XL) 25 MG 24 hr tablet, Take 25 mg by mouth daily., Disp: , Rfl:    omeprazole  (PRILOSEC) 20 MG capsule, TAKE 1 CAPSULE BY MOUTH EVERY DAY, Disp: 90 capsule, Rfl: 3   sacubitril-valsartan (ENTRESTO) 24-26 MG, Take 1 tablet by mouth 2 (two) times daily., Disp: , Rfl:    silodosin (RAPAFLO) 8 MG CAPS capsule, Take 8 mg by mouth daily with breakfast., Disp: , Rfl:    spironolactone (ALDACTONE) 25 MG tablet, Take 12.5 mg by mouth daily., Disp: , Rfl:    tamsulosin  (FLOMAX ) 0.4 MG CAPS capsule, TAKE 2 CAPSULES (0.8 MG TOTAL) BY MOUTH AT BEDTIME. 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    Hypertension    Sleep apnea    does not use CPAP   Thyroid  disease    Wears glasses     Tobacco Use: Social History   Tobacco Use  Smoking Status Former   Current packs/day: 0.00   Average packs/day: 0.5 packs/day for 45.0 years (22.5 ttl pk-yrs)   Types: Cigarettes   Start date: 01/12/1983   Quit date: 11/17/2016   Years since quitting: 7.1  Smokeless Tobacco Never    Labs: Review Flowsheet  More data exists      Latest Ref Rng  & Units 04/18/2021 10/21/2021 04/22/2022 10/21/2022 04/21/2023  Labs for ITP Cardiac and Pulmonary Rehab  Cholestrol 100 - 199 mg/dL 892  889  880  882  895   LDL (calc) 0 - 99 mg/dL 49  45  49  44  44   HDL-C >39 mg/dL 44  54  58  61  47   Trlycerides 0 - 149 mg/dL 65  39  51  51  56     Capillary Blood Glucose: Lab Results  Component Value Date   GLUCAP 93 12/08/2017   GLUCAP 106 (H) 04/29/2017   GLUCAP 105 (H) 12/02/2016     Exercise Target Goals: Exercise Program Goal: Individual exercise prescription set using results from initial 6 min walk test and THRR while considering  patient's activity barriers and safety.   Exercise Prescription Goal: Starting with aerobic activity 30 plus minutes a day, 3 days per week for initial exercise prescription. Provide home exercise prescription and guidelines that participant acknowledges understanding prior to discharge.  Activity Barriers & Risk Stratification:  Activity Barriers & Cardiac Risk Stratification - 11/03/23 1031       Activity Barriers & Cardiac Risk Stratification   Activity Barriers Shortness of Breath;Deconditioning;Muscular Weakness;Right Hip Replacement;Arthritis;Balance Concerns    Cardiac Risk Stratification Moderate          6 Minute Walk:  6 Minute Walk     Row Name 11/03/23 1257 12/08/23 1040       6 Minute Walk   Phase Initial Discharge    Distance 1230 feet --    Walk Time 6 minutes --    # of Rest Breaks 0 --    MPH 2.33 --    METS 2.67 --    RPE 11 --    VO2 Peak 9.36 --    Symptoms No --    Resting HR 67 bpm --    Resting BP 144/62 --    Resting Oxygen Saturation  96 % --    Exercise Oxygen Saturation  during 6 min walk 96 % --    Max Ex. HR 96 bpm --    Max Ex. BP 154/74 --    2 Minute Post BP 132/70 --       Oxygen Initial Assessment:   Oxygen Re-Evaluation:   Oxygen Discharge (Final Oxygen Re-Evaluation):   Initial Exercise Prescription:  Initial Exercise Prescription -  11/03/23 1300       Date of Initial Exercise RX and Referring Provider   Date 11/03/23    Referring Provider Barnet Cough MD      Oxygen   Maintain Oxygen Saturation 88% or higher      Treadmill   MPH 2.2    Grade 1    Minutes  15    METs 2.99      REL-XR   Level 3    Speed 50    Minutes 15    METs 3      Prescription Details   Frequency (times per week) 2    Duration Progress to 30 minutes of continuous aerobic without signs/symptoms of physical distress      Intensity   THRR 40-80% of Max Heartrate 99-131    Ratings of Perceived Exertion 11-13    Perceived Dyspnea 0-4      Progression   Progression Continue to progress workloads to maintain intensity without signs/symptoms of physical distress.      Resistance Training   Training Prescription Yes    Weight 4 lb    Reps 10-15          Perform Capillary Blood Glucose checks as needed.  Exercise Prescription Changes:   Exercise Prescription Changes     Row Name 11/03/23 1300 11/17/23 1500 11/19/23 1000 12/01/23 1500 12/22/23 1300     Response to Exercise   Blood Pressure (Admit) 144/62 104/70 -- 136/74 138/70   Blood Pressure (Exercise) 152/74 -- -- -- --   Blood Pressure (Exit) 132/70 122/70 -- 118/70 128/70   Heart Rate (Admit) 67 bpm 81 bpm -- 71 bpm 70 bpm   Heart Rate (Exercise) 96 bpm 104 bpm -- 93 bpm 124 bpm   Heart Rate (Exit) 71 bpm 83 bpm -- 78 bpm 112 bpm   Oxygen Saturation (Admit) 95 % -- -- -- --   Oxygen Saturation (Exercise) 96 % -- -- -- --   Rating of Perceived Exertion (Exercise) 11 12 -- 15 15   Symptoms none -- -- -- --   Comments walk test results -- -- -- --   Duration -- Continue with 30 min of aerobic exercise without signs/symptoms of physical distress. -- Continue with 30 min of aerobic exercise without signs/symptoms of physical distress. Continue with 30 min of aerobic exercise without signs/symptoms of physical distress.   Intensity -- THRR unchanged -- THRR unchanged  THRR unchanged     Progression   Progression -- Continue to progress workloads to maintain intensity without signs/symptoms of physical distress. -- Continue to progress workloads to maintain intensity without signs/symptoms of physical distress. Continue to progress workloads to maintain intensity without signs/symptoms of physical distress.     Resistance Training   Training Prescription -- Yes -- Yes Yes   Weight -- 4 -- 4 4   Reps -- 10-15 -- 10-15 10-15     Treadmill   MPH -- 2.5 -- 3.3 2.7   Grade -- 2 -- 5.5 2   Minutes -- 15 -- 15 15   METs -- 3.6 -- 6.03 3.81     REL-XR   Level -- 5 -- 4 4   Speed -- 70 -- 46 48   Minutes -- 15 -- 15 15   METs -- 1.8 -- 3.7 3     Home Exercise Plan   Plans to continue exercise at -- -- Home (comment) Home (comment) Home (comment)   Frequency -- -- Add 3 additional days to program exercise sessions. Add 3 additional days to program exercise sessions. Add 3 additional days to program exercise sessions.   Initial Home Exercises Provided -- -- 11/19/23 -- --    Row Name 01/12/24 1500             Response to Exercise   Blood Pressure (Admit) 100/60  Blood Pressure (Exit) 106/70       Heart Rate (Admit) 62 bpm       Heart Rate (Exercise) 100 bpm       Heart Rate (Exit) 71 bpm       Rating of Perceived Exertion (Exercise) 13       Duration Continue with 30 min of aerobic exercise without signs/symptoms of physical distress.       Intensity THRR unchanged         Progression   Progression Continue to progress workloads to maintain intensity without signs/symptoms of physical distress.         Resistance Training   Training Prescription Yes       Weight 5       Reps 10-15         Treadmill   MPH 3.1       Grade 3       Minutes 15       METs 4.66         REL-XR   Level 4       Speed 51       Minutes 15       METs 2.7         Home Exercise Plan   Plans to continue exercise at Home (comment)       Frequency Add 3  additional days to program exercise sessions.          Exercise Comments:   Exercise Comments     Row Name 11/05/23 1028 11/19/23 1044         Exercise Comments First full day of exercise!  Patient was oriented to gym and equipment including functions, settings, policies, and procedures.  Patient's individual exercise prescription and treatment plan were reviewed.  All starting workloads were established based on the results of the 6 minute walk test done at initial orientation visit.  The plan for exercise progression was also introduced and progression will be customized based on patient's performance and goals reviewed home exercise with pt         Exercise Goals and Review:   Exercise Goals Re-Evaluation :  Exercise Goals Re-Evaluation     Row Name 11/05/23 1029 11/24/23 0821 12/17/23 1147 01/12/24 1045 01/14/24 0928     Exercise Goal Re-Evaluation   Exercise Goals Review Increase Physical Activity;Increase Strength and Stamina;Understanding of Exercise Prescription Increase Physical Activity;Increase Strength and Stamina;Understanding of Exercise Prescription Increase Physical Activity;Increase Strength and Stamina;Able to understand and use rate of perceived exertion (RPE) scale;Able to understand and use Dyspnea scale;Able to check pulse independently;Knowledge and understanding of Target Heart Rate Range (THRR);Understanding of Exercise Prescription Increase Physical Activity;Increase Strength and Stamina;Understanding of Exercise Prescription Increase Physical Activity;Increase Strength and Stamina;Understanding of Exercise Prescription   Comments Reviewed RPE and dyspnea scale, THR and program prescription with pt today.  Pt voiced understanding and was given a copy of goals to take home. Ron is doing well in CR. He has continued to increase walking speed and level on the XR since changing from PR  to CR. Will continue to montior and progress as able Ron is doing great in rehab!  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 He notes is a long walk and has hills also. Ron is doing great in rehab! 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 He notes is a long walk and has hills also. He is doing great on the equipment here also as he is  increasing his levels on the NuStep and treadmill as he can. Ros has completed 28 sessions of CR. He is getting close to the end and will be doing his walk test soon and then come off the montior for the remainder of sessions. He is increasing his speed on the treadmill and is at a fast walk but we do not want hos to run on the .   Expected Outcomes Short: Use RPE daily to regulate intensity.  Long: Follow program prescription in THR. Short: increase grade on treadmill   long: continue to exericse Short: continue to attend rehab. Long: continue to exercise daily for at least 30 minutes. Short: continue to attend rehab. Long: continue to exercise daily for at least 30 minutes. Short: continue to attend rehab   long: continue with exercise outside of rehab       Discharge Exercise Prescription (Final Exercise Prescription Changes):  Exercise Prescription Changes - 01/12/24 1500       Response to Exercise   Blood Pressure (Admit) 100/60    Blood Pressure (Exit) 106/70    Heart Rate (Admit) 62 bpm    Heart Rate (Exercise) 100 bpm    Heart Rate (Exit) 71 bpm    Rating of Perceived Exertion (Exercise) 13    Duration Continue with 30 min of aerobic exercise without signs/symptoms of physical distress.    Intensity THRR unchanged      Progression   Progression Continue to progress workloads to maintain intensity without signs/symptoms of physical distress.      Resistance Training   Training Prescription Yes    Weight 5    Reps 10-15      Treadmill   MPH 3.1    Grade 3    Minutes 15    METs 4.66      REL-XR   Level 4    Speed 51    Minutes 15    METs 2.7      Home Exercise Plan   Plans to continue exercise at Home  (comment)    Frequency Add 3 additional days to program exercise sessions.          Nutrition:  Target Goals: Understanding of nutrition guidelines, daily intake of sodium 1500mg , cholesterol 200mg , calories 30% from fat and 7% or less from saturated fats, daily to have 5 or more servings of fruits and vegetables.  Biometrics:  Pre Biometrics - 11/03/23 1316       Pre Biometrics   Height 5' 7 (1.702 m)    Weight 181 lb 4.8 oz (82.2 kg)    Waist Circumference 38 inches    Hip Circumference 39 inches    Waist to Hip Ratio 0.97 %    BMI (Calculated) 28.39    Grip Strength 18.6 kg           Nutrition Therapy Plan and Nutrition Goals:  Nutrition Therapy & Goals - 11/03/23 1325       Intervention Plan   Intervention Prescribe, educate and counsel regarding individualized specific dietary modifications aiming towards targeted core components such as weight, hypertension, lipid management, diabetes, heart failure and other comorbidities.;Nutrition handout(s) given to patient.    Expected Outcomes Short Term Goal: Understand basic principles of dietary content, such as calories, fat, sodium, cholesterol and nutrients.;Long Term Goal: Adherence to prescribed nutrition plan.          Nutrition Assessments:  MEDIFICTS Score Key: >=70 Need to make dietary changes  40-70 Heart Healthy Diet <= 40 Therapeutic  Level Cholesterol Diet  Flowsheet Row CARDIAC REHAB PHASE II ORIENTATION from 11/03/2023 in [facility]  Picture Your Plate Total Score on Admission 68   Picture Your Plate Scores: <59 Unhealthy dietary pattern with much room for improvement. 41-50 Dietary pattern unlikely to meet recommendations for good health and room for improvement. 51-60 More healthful dietary pattern, with some room for improvement.  >60 Healthy dietary pattern, although there may be some specific behaviors that could be improved.    Nutrition Goals Re-Evaluation:   Nutrition Goals Re-Evaluation     Row Name 12/17/23 1139 01/12/24 1043           Goals   Nutrition Goal healthy eating healthy eating      Comment Ron's appetite is much much better. He notes gaining more weight back because he is eating more, which he feels much better. He notes incorporating more protein also, and notes he doesn't really have to watch his salt, because his sodium has actually ran low. Ron is doing well in rehab. He states he is trying to cut down on sweets as he eats too many. Told him to try and eat those in moderation.      Expected Outcome Short: Continue to eat more protein. Long: continue eating healthy, well balanced meals. Short: Continue to drink enough water. Long: Eat sweets in moderation.         Nutrition Goals Discharge (Final Nutrition Goals Re-Evaluation):  Nutrition Goals Re-Evaluation - 01/12/24 1043       Goals   Nutrition Goal healthy eating    Comment Ron is doing well in rehab. He states he is trying to cut down on sweets as he eats too many. Told him to try and eat those in moderation.    Expected Outcome Short: Continue to drink enough water. Long: Eat sweets in moderation.          Psychosocial: Target Goals: Acknowledge presence or absence of significant depression and/or stress, maximize coping skills, provide positive support system. Participant is able to verbalize types and ability to use techniques and skills needed for reducing stress and depression.  Initial Review & Psychosocial Screening:  Initial Psych Review & Screening - 11/03/23 1320       Initial Review   Current issues with Current Sleep Concerns;Current Psychotropic Meds;Current Stress Concerns    Source of Stress Concerns Chronic Illness;Unable to participate in former interests or hobbies;Unable to perform yard/household activities    Comments new TAVR surgery and eager to return to rehab.      Family Dynamics   Good Support System? Yes   wife     Barriers    Psychosocial barriers to participate in program Psychosocial barriers identified (see note);The patient should benefit from training in stress management and relaxation.      Screening Interventions   Interventions Encouraged to exercise;To provide support and resources with identified psychosocial needs;Provide feedback about the scores to participant    Expected Outcomes Short Term goal: Utilizing psychosocial counselor, staff and physician to assist with identification of specific Stressors or current issues interfering with healing process. Setting desired goal for each stressor or current issue identified.;Long Term Goal: Stressors or current issues are controlled or eliminated.;Short Term goal: Identification and review with participant of any Quality of Life or Depression concerns found by scoring the questionnaire.;Long Term goal: The participant improves quality of Life and PHQ9 Scores as seen by post scores and/or verbalization of changes  Quality of Life Scores:  Quality of Life - 11/19/23 1048       Quality of Life   Select Quality of Life      Quality of Life Scores   Health/Function Pre 18.8 %    Socioeconomic Pre 29.25 %    Psych/Spiritual Pre 30 %    Family Pre 19.2 %    GLOBAL Pre 23.49 %         Scores of 19 and below usually indicate a poorer quality of life in these areas.  A difference of  2-3 points is a clinically meaningful difference.  A difference of 2-3 points in the total score of the Quality of Life Index has been associated with significant improvement in overall quality of life, self-image, physical symptoms, and general health in studies assessing change in quality of life.  PHQ-9: Review Flowsheet  More data exists      11/03/2023 08/17/2023 04/21/2023 01/28/2023 10/21/2022  Depression screen PHQ 2/9  Decreased Interest 0 0 0 0 0  Down, Depressed, Hopeless 0 0 0 0 0  PHQ - 2 Score 0 0 0 0 0  Altered sleeping 0 3 0 - -  Tired, decreased energy  0 1 0 - -  Change in appetite 0 2 0 - -  Feeling bad or failure about yourself  0 0 0 - -  Trouble concentrating 0 0 0 - -  Moving slowly or fidgety/restless 0 0 0 - -  Suicidal thoughts 0 0 0 - -  PHQ-9 Score 0  6  0  - -  Difficult doing work/chores Somewhat difficult Somewhat difficult Not difficult at all - -    Details       Data saved with a previous flowsheet row definition        Interpretation of Total Score  Total Score Depression Severity:  1-4 = Minimal depression, 5-9 = Mild depression, 10-14 = Moderate depression, 15-19 = Moderately severe depression, 20-27 = Severe depression   Psychosocial Evaluation and Intervention:  Psychosocial Evaluation - 11/03/23 1321       Psychosocial Evaluation & Interventions   Interventions Stress management education;Relaxation education;Encouraged to exercise with the program and follow exercise prescription    Comments Ron is returning to cardiac rehab.  He was in pulmonary but had heart surgery and is now in cardiac rehab.  He attended today for orientation and we updated his information.  He is still on Lexapro  and feeling well managed.  He is sleeping little better than prior to surgery.  He is still not good with wearing his CPAP at night.  He is looking forward to getting back to rehab again as he noticed that it did make him feel better overall.    Expected Outcomes Short: Attend rehab to get moving again Long: Continue to stay positive    Continue Psychosocial Services  Follow up required by staff          Psychosocial Re-Evaluation:  Psychosocial Re-Evaluation     Row Name 12/17/23 1137 01/12/24 1042           Psychosocial Re-Evaluation   Current issues with Current Sleep Concerns None Identified      Comments Ron doesn't complain of any stressors. He notes his sleep is much better, he is still sleeping with his HOB elevated and he says that helps him so much, he doesn't think he could ever sleep flat. Ron is doing  well in rehab. He doesn't complain of any  sleep or stress concerns.      Expected Outcomes Short: montior sleep if it gets worse contact MD    long: continue to have no stressors Short: Continue to attend rehab. Long: Continue to have no stressors.      Interventions Encouraged to attend Cardiac Rehabilitation for the exercise Encouraged to attend Cardiac Rehabilitation for the exercise      Continue Psychosocial Services  Follow up required by staff Follow up required by staff         Psychosocial Discharge (Final Psychosocial Re-Evaluation):  Psychosocial Re-Evaluation - 01/12/24 1042       Psychosocial Re-Evaluation   Current issues with None Identified    Comments Ron is doing well in rehab. He doesn't complain of any sleep or stress concerns.    Expected Outcomes Short: Continue to attend rehab. Long: Continue to have no stressors.    Interventions Encouraged to attend Cardiac Rehabilitation for the exercise    Continue Psychosocial Services  Follow up required by staff          Vocational Rehabilitation: Provide vocational rehab assistance to qualifying candidates.   Vocational Rehab Evaluation & Intervention:  Vocational Rehab - 11/03/23 1326       Initial Vocational Rehab Evaluation & Intervention   Assessment shows need for Vocational Rehabilitation No   retired         Education: Education Goals: Education classes will be provided on a weekly basis, covering required topics. Participant will state understanding/return demonstration of topics presented.  Learning Barriers/Preferences:  Learning Barriers/Preferences - 11/03/23 1326       Learning Barriers/Preferences   Learning Barriers None    Learning Preferences Skilled Demonstration;Verbal Instruction          Education Topics: Hypertension, Hypertension Reduction -Define heart disease and high blood pressure. Discus how high blood pressure affects the body and ways to reduce high blood  pressure.   Exercise and Your Heart -Discuss why it is important to exercise, the FITT principles of exercise, normal and abnormal responses to exercise, and how to exercise safely.   Angina -Discuss definition of angina, causes of angina, treatment of angina, and how to decrease risk of having angina.   Cardiac Medications -Review what the following cardiac medications are used for, how they affect the body, and side effects that may occur when taking the medications.  Medications include Aspirin , Beta blockers, calcium  channel blockers, ACE Inhibitors, angiotensin receptor blockers, diuretics, digoxin, and antihyperlipidemics. Flowsheet Row PULMONARY REHAB OTHER RESPIRATORY from 10/01/2023 in [facility]  Date 09/24/23  Educator [facility]  Instruction Review Code 1- Verbalizes Understanding    Congestive Heart Failure -Discuss the definition of CHF, how to live with CHF, the signs and symptoms of CHF, and how keep track of weight and sodium intake.   Heart Disease and Intimacy -Discus the effect sexual activity has on the heart, how changes occur during intimacy as we age, and safety during sexual activity. Flowsheet Row PULMONARY REHAB OTHER RESPIRATORY from 10/01/2023 in [place] [facility]  Date 08/27/23  Educator jh  Instruction Review Code 1- Verbalizes Understanding    Smoking Cessation / COPD -Discuss different methods to quit smoking, the health benefits of quitting smoking, and the definition of COPD.   Nutrition I: Fats -Discuss the types of cholesterol, what cholesterol does to the heart, and how cholesterol levels can be controlled.   Nutrition II: Labels -Discuss the different components of food labels and how to read food label  Heart Parts/Heart Disease and PAD -Discuss the anatomy of the heart, the pathway of blood circulation through the heart, and these are affected by heart disease.   Stress I: Signs and Symptoms -Discuss  the causes of stress, how stress may lead to anxiety and depression, and ways to limit stress. Flowsheet Row PULMONARY REHAB OTHER RESPIRATORY from 10/01/2023 in [place] [facility]  Date 09/10/23  Educator [facility]  Instruction Review Code 1- Verbalizes Understanding    Stress II: Relaxation -Discuss different types of relaxation techniques to limit stress.   Warning Signs of Stroke / TIA -Discuss definition of a stroke, what the signs and symptoms are of a stroke, and how to identify when someone is having stroke.   Knowledge Questionnaire Score:  Knowledge Questionnaire Score - 11/19/23 1048       Knowledge Questionnaire Score   Pre Score 23/26          Core Components/Risk Factors/Patient Goals at Admission:  Personal Goals and Risk Factors at Admission - 11/03/23 1326       Core Components/Risk Factors/Patient Goals on Admission    Weight Management Weight Maintenance;Yes    Intervention Weight Management: Develop a combined nutrition and exercise program designed to reach desired caloric intake, while maintaining appropriate intake of nutrient and fiber, sodium and fats, and appropriate energy expenditure required for the weight goal.;Weight Management: Provide education and appropriate resources to help participant work on and attain dietary goals.    Admit Weight 181 lb 4.8 oz (82.2 kg)    Goal Weight: Short Term 180 lb (81.6 kg)    Goal Weight: Long Term 180 lb (81.6 kg)    Expected Outcomes Short Term: Continue to assess and modify interventions until short term weight is achieved;Long Term: Adherence to nutrition and physical activity/exercise program aimed toward attainment of established weight goal;Weight Maintenance: Understanding of the daily nutrition guidelines, which includes 25-35% calories from fat, 7% or less cal from saturated fats, less than 200mg  cholesterol, less than 1.5gm of sodium, & 5 or more servings of fruits and vegetables daily    Improve  shortness of breath with ADL's Yes    Intervention Provide education, individualized exercise plan and daily activity instruction to help decrease symptoms of SOB with activities of daily living.    Expected Outcomes Short Term: Improve cardiorespiratory fitness to achieve a reduction of symptoms when performing ADLs;Long Term: Be able to perform more ADLs without symptoms or delay the onset of symptoms    Heart Failure Yes    Intervention Provide a combined exercise and nutrition program that is supplemented with education, support and counseling about heart failure. Directed toward relieving symptoms such as shortness of breath, decreased exercise tolerance, and extremity edema.    Expected Outcomes Improve functional capacity of life;Short term: Daily weights obtained and reported for increase. Utilizing diuretic protocols set by physician.;Long term: Adoption of self-care skills and reduction of barriers for early signs and symptoms recognition and intervention leading to self-care maintenance.;Short term: Attendance in program 2-3 days a week with increased exercise capacity. Reported lower sodium intake. Reported increased fruit and vegetable intake. Reports medication compliance.    Hypertension Yes    Intervention Provide education on lifestyle modifcations including regular physical activity/exercise, weight management, moderate sodium restriction and increased consumption of fresh fruit, vegetables, and low fat dairy, alcohol moderation, and smoking cessation.;Monitor prescription use compliance.    Expected Outcomes Short Term: Continued assessment and intervention until BP is < 140/51mm HG in hypertensive participants. <  130/72mm HG in hypertensive participants with diabetes, heart failure or chronic kidney disease.;Long Term: Maintenance of blood pressure at goal levels.    Lipids Yes    Intervention Provide education and support for participant on nutrition & aerobic/resistive exercise along  with prescribed medications to achieve LDL 70mg , HDL >40mg .    Expected Outcomes Short Term: Participant states understanding of desired cholesterol values and is compliant with medications prescribed. Participant is following exercise prescription and nutrition guidelines.;Long Term: Cholesterol controlled with medications as prescribed, with individualized exercise RX and with personalized nutrition plan. Value goals: LDL < 70mg , HDL > 40 mg.          Core Components/Risk Factors/Patient Goals Review:   Goals and Risk Factor Review     Row Name 12/17/23 1142 01/12/24 1045           Core Components/Risk Factors/Patient Goals Review   Personal Goals Review Weight Management/Obesity;Improve shortness of breath with ADL's Weight Management/Obesity;Improve shortness of breath with ADL's      Review Ron notes no changes in medications, and the meds he takes are working great. He is checking his blood pressure daily at home and they are looking good. Ron notes no changes in medications, and the meds he takes are working great. He is checking his blood pressure daily at home and they are looking good.      Expected Outcomes Short: Continue to take medications as prescribed by MD. Long: Keep exercising for overall happiness. Short: Continue to take medications as prescribed by MD. Long: Keep exercising for overall happiness.         Core Components/Risk Factors/Patient Goals at Discharge (Final Review):   Goals and Risk Factor Review - 01/12/24 1045       Core Components/Risk Factors/Patient Goals Review   Personal Goals Review Weight Management/Obesity;Improve shortness of breath with ADL's    Review Ron notes no changes in medications, and the meds he takes are working great. He is checking his blood pressure daily at home and they are looking good.    Expected Outcomes Short: Continue to take medications as prescribed by MD. Long: Keep exercising for overall happiness.           ITP Comments:  ITP Comments     Row Name 11/03/23 1247 11/04/23 1503 11/05/23 1028 11/19/23 1044 12/02/23 1524   ITP Comments Patient attend orientation today.  Patient is attending Cardiac Rehabilitation Program.  Documentation for diagnosis can be found in [facility] 10/22/23.  Reviewed medical chart, RPE/RPD, gym safety, and program guidelines.  Patient was fitted to equipment they will be using during rehab.  Patient is scheduled to start exercise on Thursday 11/05/23 at 1030.   Initial ITP created and sent for review and signature by Dr. Dorn Ross, Medical Director for Cardiac Rehabilitation Program. 30 day review completed. ITP sent to Dr. Dorn Ross, Medical Director of Cardiac Rehab. Continue with ITP unless changes are made by physician.  He just oriented for cardiac rehab yesterday. First full day of exercise!  Patient was oriented to gym and equipment including functions, settings, policies, and procedures.  Patient's individual exercise prescription and treatment plan were reviewed.  All starting workloads were established based on the results of the 6 minute walk test done at initial orientation visit.  The plan for exercise progression was also introduced and progression will be customized based on patient's performance and goals reviewed home exercise with pt 30 day review completed. ITP sent to Dr. Dorn Ross, Medical Director  of Cardiac Rehab. Continue with ITP unless changes are made by physician.    Row Name 12/30/23 0850 01/26/24 1045         ITP Comments 30 day review completed. ITP sent to Dr. Dorn Ross, Medical Director of Cardiac Rehab. Continue with ITP unless changes are made by physician. 30 day review completed. ITP sent to Dr. Dorn Ross, Medical Director of Cardiac Rehab. Continue with ITP unless changes are made by physician.         Comments: 30 day review

## 2024-01-26 NOTE — Progress Notes (Signed)
 Daily Session Note  Patient Details  Name: Terry Pacheco MRN: [id number] Date of Birth: 04-03-1950 Referring Provider:   Flowsheet Row CARDIAC REHAB PHASE II ORIENTATION from 11/03/2023 in [facility]  Referring Provider Barnet Cough MD    Encounter Date: 01/26/2024  Check In:  Session Check In - 01/26/24 1031       Check-In   Supervising physician immediately available to respond to emergencies See telemetry face sheet for immediately available MD    Location [facility]    Staff Present Powell Benders, BS, Exercise Physiologist;Brittany Jackquline, BSN, RN, WTA-C;Lylliana Kitamura Zina, RN    Virtual Visit No    Medication changes reported     No    Fall or balance concerns reported    No    Warm-up and Cool-down Performed on first and last piece of equipment    Resistance Training Performed Yes    VAD Patient? No    PAD/SET Patient? No      Pain Assessment   Currently in Pain? No/denies          Capillary Blood Glucose: No results found for this or any previous visit (from the past 24 hours).    Social History   Tobacco Use  Smoking Status Former   Current packs/day: 0.00   Average packs/day: 0.5 packs/day for 45.0 years (22.5 ttl pk-yrs)   Types: Cigarettes   Start date: 01/12/1983   Quit date: 11/17/2016   Years since quitting: 7.1  Smokeless Tobacco Never    Goals Met:  Independence with exercise equipment Exercise tolerated well No report of concerns or symptoms today Strength training completed today  Goals Unmet:  Not Applicable  Comments: Pt able to follow exercise prescription today without complaint.  Will continue to monitor for progression.

## 2024-01-27 ENCOUNTER — Encounter: Payer: Self-pay | Admitting: Gastroenterology

## 2024-01-28 ENCOUNTER — Encounter (HOSPITAL_COMMUNITY)
Admission: RE | Admit: 2024-01-28 | Discharge: 2024-01-28 | Disposition: A | Source: Ambulatory Visit | Attending: Internal Medicine

## 2024-01-28 VITALS — Ht 67.0 in | Wt 195.5 lb

## 2024-01-28 DIAGNOSIS — Z952 Presence of prosthetic heart valve: Secondary | ICD-10-CM

## 2024-01-28 NOTE — Patient Instructions (Signed)
 Discharge Patient Instructions  Patient Details  Name: Terry Pacheco MRN: [id number] Date of Birth: May 24, 1950 Referring Provider:  Donata Snowman, PA*   Number of Visits: 41  Reason for Discharge:  Patient reached a stable level of exercise. Patient independent in their exercise. Patient has met program and personal goals.  Smoking History:  Social History   Tobacco Use  Smoking Status Former   Current packs/day: 0.00   Average packs/day: 0.5 packs/day for 45.0 years (22.5 ttl pk-yrs)   Types: Cigarettes   Start date: 01/12/1983   Quit date: 11/17/2016   Years since quitting: 7.2  Smokeless Tobacco Never    Diagnosis:  S/P TAVR (transcatheter aortic valve replacement)  Initial Exercise Prescription:  Initial Exercise Prescription - 11/03/23 1300       Date of Initial Exercise RX and Referring Provider   Date 11/03/23    Referring Provider Barnet Cough MD      Oxygen   Maintain Oxygen Saturation 88% or higher      Treadmill   MPH 2.2    Grade 1    Minutes 15    METs 2.99      REL-XR   Level 3    Speed 50    Minutes 15    METs 3      Prescription Details   Frequency (times per week) 2    Duration Progress to 30 minutes of continuous aerobic without signs/symptoms of physical distress      Intensity   THRR 40-80% of Max Heartrate 99-131    Ratings of Perceived Exertion 11-13    Perceived Dyspnea 0-4      Progression   Progression Continue to progress workloads to maintain intensity without signs/symptoms of physical distress.      Resistance Training   Training Prescription Yes    Weight 4 lb    Reps 10-15          Discharge Exercise Prescription (Final Exercise Prescription Changes):  Exercise Prescription Changes - 01/26/24 1500       Response to Exercise   Blood Pressure (Admit) 110/60    Blood Pressure (Exit) 108/60    Heart Rate (Admit) 83 bpm    Heart Rate (Exercise) 104 bpm    Heart Rate (Exit) 91 bpm    Rating of  Perceived Exertion (Exercise) 15    Duration Continue with 30 min of aerobic exercise without signs/symptoms of physical distress.    Intensity THRR unchanged      Progression   Progression Continue to progress workloads to maintain intensity without signs/symptoms of physical distress.      Resistance Training   Training Prescription Yes    Weight 5    Reps 10-15      Treadmill   MPH 3.3    Grade 3    Minutes 15    METs 4.89      REL-XR   Level 4    Speed 54    Minutes 15    METs 4.7      Home Exercise Plan   Plans to continue exercise at Home (comment)    Frequency Add 3 additional days to program exercise sessions.          Functional Capacity:  6 Minute Walk     Row Name 08/17/23 0939 11/03/23 1257 12/08/23 1040     6 Minute Walk   Phase Initial Initial Discharge   Distance 1190 feet 1230 feet --   Walk Time 6 minutes 6  minutes --   # of Rest Breaks 0 0 --   MPH 2.25 2.33 --   METS 2.28 2.67 --   RPE 12 11 --   Perceived Dyspnea  1 -- --   VO2 Peak 7.96 9.36 --   Symptoms No No --   Resting HR 81 bpm 67 bpm --   Resting BP 90/60 144/62 --   Resting Oxygen Saturation  92 % 96 % --   Exercise Oxygen Saturation  during 6 min walk 88 % 96 % --   Max Ex. HR 92 bpm 96 bpm --   Max Ex. BP 106/62 154/74 --   2 Minute Post BP 98/60 132/70 --     Interval HR   1 Minute HR 88 -- --   2 Minute HR 99 -- --   3 Minute HR 98 -- --   4 Minute HR 92 -- --   5 Minute HR 92 -- --   6 Minute HR 92 -- --   2 Minute Post HR 83 -- --   Interval Heart Rate? Yes -- --     Interval Oxygen   Interval Oxygen? Yes -- --   Baseline Oxygen Saturation % 92 % -- --   1 Minute Oxygen Saturation % 92 % -- --   1 Minute Liters of Oxygen 0 L -- --   2 Minute Oxygen Saturation % 90 % -- --   2 Minute Liters of Oxygen 0 L -- --   3 Minute Oxygen Saturation % 89 % -- --   3 Minute Liters of Oxygen 0 L -- --   4 Minute Oxygen Saturation % 88 % -- --   4 Minute Liters of Oxygen  0 L -- --   5 Minute Oxygen Saturation % 90 % -- --   5 Minute Liters of Oxygen 0 L -- --   6 Minute Oxygen Saturation % 89 % -- --   6 Minute Liters of Oxygen 0 L -- --   2 Minute Post Oxygen Saturation % 93 % -- --   2 Minute Post Liters of Oxygen 0 L -- --    Row Name 01/28/24 1043         6 Minute Walk   Phase Discharge     Distance 1630 feet     Distance Feet Change 400 ft     Walk Time 6 minutes     # of Rest Breaks 0     MPH 3.08     METS 3.26     RPE 13     Perceived Dyspnea  0     VO2 Peak 11.4     Symptoms No     Resting HR 60 bpm     Resting BP 140/80     Resting Oxygen Saturation  96 %     Exercise Oxygen Saturation  during 6 min walk 96 %     Max Ex. HR 106 bpm     Max Ex. BP 140/70        Nutrition & Weight - Outcomes:  Pre Biometrics - 11/03/23 1316       Pre Biometrics   Height 5' 7 (1.702 m)    Weight 82.2 kg    Waist Circumference 38 inches    Hip Circumference 39 inches    Waist to Hip Ratio 0.97 %    BMI (Calculated) 28.39    Grip Strength 18.6 kg  Post Biometrics - 01/28/24 1044        Post  Biometrics   Height 5' 7 (1.702 m)    Weight 88.7 kg    Waist Circumference 40 inches    Hip Circumference 39 inches    Waist to Hip Ratio 1.03 %    BMI (Calculated) 30.62    Grip Strength 23.5 kg    Single Leg Stand 30 seconds          Goals reviewed with patient; copy given to patient.

## 2024-01-28 NOTE — Progress Notes (Signed)
 Daily Session Note  Patient Details  Name: Terry Pacheco MRN: [id number] Date of Birth: 03/13/1950 Referring Provider:   Flowsheet Row CARDIAC REHAB PHASE II ORIENTATION from 11/03/2023 in [facility]  Referring Provider Barnet Cough MD    Encounter Date: 01/28/2024  Check In:  Session Check In - 01/28/24 1029       Check-In   Supervising physician immediately available to respond to emergencies See telemetry face sheet for immediately available MD    Location [facility]    Staff Present Harlene Gelineau, MA, RCEP, CCRP, CCET;Heather Con, BS, Exercise Physiologist;Debra Vicci, RN, Film/video Editor BSN, RN    Virtual Visit No    Medication changes reported     No    Fall or balance concerns reported    No    Tobacco Cessation No Change    Warm-up and Cool-down Performed on first and last piece of equipment    Resistance Training Performed Yes    VAD Patient? No    PAD/SET Patient? No      Pain Assessment   Currently in Pain? No/denies    Multiple Pain Sites No          Capillary Blood Glucose: No results found for this or any previous visit (from the past 24 hours).    Social History   Tobacco Use  Smoking Status Former   Current packs/day: 0.00   Average packs/day: 0.5 packs/day for 45.0 years (22.5 ttl pk-yrs)   Types: Cigarettes   Start date: 01/12/1983   Quit date: 11/17/2016   Years since quitting: 7.2  Smokeless Tobacco Never    Goals Met:  Independence with exercise equipment Exercise tolerated well No report of concerns or symptoms today Strength training completed today  Goals Unmet:  Not Applicable  Comments: SABRASABRAPt able to follow exercise prescription today without complaint.  Will continue to monitor for progression.

## 2024-02-02 ENCOUNTER — Encounter (HOSPITAL_COMMUNITY)
Admission: RE | Admit: 2024-02-02 | Discharge: 2024-02-02 | Disposition: A | Source: Ambulatory Visit | Attending: Internal Medicine

## 2024-02-02 DIAGNOSIS — Z952 Presence of prosthetic heart valve: Secondary | ICD-10-CM | POA: Diagnosis not present

## 2024-02-02 NOTE — Progress Notes (Signed)
 Daily Session Note  Patient Details  Name: Terry Pacheco MRN: [id number] Date of Birth: 1950-04-12 Referring Provider:   Flowsheet Row CARDIAC REHAB PHASE II ORIENTATION from 11/03/2023 in [facility]AC [facility]  Referring Provider Barnet Cough MD    Encounter Date: 02/02/2024  Check In:  Session Check In - 02/02/24 1012       Check-In   Supervising physician immediately available to respond to emergencies See telemetry face sheet for immediately available MD    Location [facility]    Staff Present Laymon Rattler, BSN, RN, WTA-C;Heather Con, BS, Exercise Physiologist    Virtual Visit No    Medication changes reported     No    Fall or balance concerns reported    No    Tobacco Cessation No Change    Warm-up and Cool-down Performed on first and last piece of equipment    Resistance Training Performed Yes    VAD Patient? No    PAD/SET Patient? No      Pain Assessment   Currently in Pain? No/denies          Capillary Blood Glucose: No results found for this or any previous visit (from the past 24 hours).    Social History   Tobacco Use  Smoking Status Former   Current packs/day: 0.00   Average packs/day: 0.5 packs/day for 45.0 years (22.5 ttl pk-yrs)   Types: Cigarettes   Start date: 01/12/1983   Quit date: 11/17/2016   Years since quitting: 7.2  Smokeless Tobacco Never    Goals Met:  Independence with exercise equipment Exercise tolerated well No report of concerns or symptoms today Strength training completed today  Goals Unmet:  Not Applicable  Comments: Pt able to follow exercise prescription today without complaint.  Will continue to monitor for progression.

## 2024-02-04 ENCOUNTER — Ambulatory Visit: Admitting: Gastroenterology

## 2024-02-04 ENCOUNTER — Encounter: Payer: Self-pay | Admitting: Gastroenterology

## 2024-02-04 ENCOUNTER — Encounter (HOSPITAL_COMMUNITY)

## 2024-02-04 VITALS — BP 148/77 | HR 55 | Temp 97.6°F | Resp 13 | Ht 69.0 in | Wt 186.0 lb

## 2024-02-04 DIAGNOSIS — K5730 Diverticulosis of large intestine without perforation or abscess without bleeding: Secondary | ICD-10-CM

## 2024-02-04 DIAGNOSIS — D122 Benign neoplasm of ascending colon: Secondary | ICD-10-CM | POA: Diagnosis not present

## 2024-02-04 DIAGNOSIS — Z860101 Personal history of adenomatous and serrated colon polyps: Secondary | ICD-10-CM | POA: Diagnosis not present

## 2024-02-04 DIAGNOSIS — Z1211 Encounter for screening for malignant neoplasm of colon: Secondary | ICD-10-CM

## 2024-02-04 DIAGNOSIS — Z860100 Personal history of colon polyps, unspecified: Secondary | ICD-10-CM

## 2024-02-04 DIAGNOSIS — K648 Other hemorrhoids: Secondary | ICD-10-CM

## 2024-02-04 MED ORDER — SODIUM CHLORIDE 0.9 % IV SOLN: 500.0000 mL | INTRAVENOUS | Status: DC

## 2024-02-04 NOTE — Op Note (Signed)
 [facility] Patient Name: Terry Pacheco Procedure Date: 02/04/2024 9:15 AM MRN: [id number] Endoscopist: Victory L. Legrand , MD, [id number] Age: 73 Referring MD:  Date of Birth: 1950-05-08 Gender: Male Account #: [id number] Procedure:                Colonoscopy Indications:              Surveillance: Personal history of adenomatous                            polyps on last colonoscopy 5 years ago                           Diminutive tubular adenoma October 2020 Medicines:                Monitored Anesthesia Care Procedure:                Pre-Anesthesia Assessment:                           - Prior to the procedure, a History and Physical                            was performed, and patient medications and                            allergies were reviewed. The patient's tolerance of                            previous anesthesia was also reviewed. The risks                            and benefits of the procedure and the sedation                            options and risks were discussed with the patient.                            All questions were answered, and informed consent                            was obtained. Prior Anticoagulants: The patient has                            taken no anticoagulant or antiplatelet agents. ASA                            Grade Assessment: III - A patient with severe                            systemic disease. After reviewing the risks and                            benefits, the patient was deemed in satisfactory  condition to undergo the procedure.                           After obtaining informed consent, the colonoscope                            was passed under direct vision. Throughout the                            procedure, the patient's blood pressure, pulse, and                            oxygen saturations were monitored continuously. The                            CF [id number] #[id number] was introduced  through the anus                            and advanced to the the cecum, identified by                            appendiceal orifice and ileocecal valve. The                            colonoscopy was somewhat difficult due to a                            redundant colon. Successful completion of the                            procedure was aided by using manual pressure and                            straightening and shortening the scope to obtain                            bowel loop reduction. The patient tolerated the                            procedure well. The quality of the bowel                            preparation was adequate with additional lavage.                            The ileocecal valve, appendiceal orifice, and                            rectum were photographed. The bowel preparation                            used was GoLYTELY . Scope In: 9:31:33 AM Scope Out: 9:50:05 AM Scope Withdrawal Time: 0 hours 13 minutes 40 seconds  Total Procedure Duration: 0 hours 18 minutes  32 seconds  Findings:                 The perianal and digital rectal examinations were                            normal.                           Repeat examination of right colon under NBI                            performed.                           Multiple diverticula were found in the entire colon.                           A diminutive polyp was found in the ascending                            colon. The polyp was semi-sessile. The polyp was                            removed with a cold snare. Resection and retrieval                            were complete.                           Internal hemorrhoids were found. The hemorrhoids                            were small.                           The exam was otherwise without abnormality on                            direct and retroflexion views. Complications:            No immediate complications. Estimated Blood Loss:      Estimated blood loss was minimal. Impression:               - Diverticulosis in the entire examined colon.                           - One diminutive polyp in the ascending colon,                            removed with a cold snare. Resected and retrieved.                           - Internal hemorrhoids.                           - The examination was otherwise normal on direct  and retroflexion views. Recommendation:           - Patient has a contact number available for                            emergencies. The signs and symptoms of potential                            delayed complications were discussed with the                            patient. Return to normal activities tomorrow.                            Written discharge instructions were provided to the                            patient.                           - Resume previous diet.                           - Continue present medications.                           - Await pathology results.                           - No repeat colonoscopy due to age, current                            guidelines and low risk findings today. Marketa Midkiff L. Legrand, MD 02/04/2024 9:55:19 AM This report has been signed electronically.

## 2024-02-04 NOTE — Progress Notes (Signed)
 Called to room to assist during endoscopic procedure.  Patient ID and intended procedure confirmed with present staff. Received instructions for my participation in the procedure from the performing physician.

## 2024-02-04 NOTE — Progress Notes (Signed)
 Vss nad trans to pacu

## 2024-02-04 NOTE — Patient Instructions (Signed)

## 2024-02-04 NOTE — Progress Notes (Signed)
 History and Physical:  This patient presents for endoscopic testing for: Encounter Diagnosis  Name Primary?   Hx of colonic polyps Yes    73 year old man here today for a surveillance colonoscopy Diminutive tubular adenoma October 2020 (fair quality bowel preparation with GoLytely ) Patient denies chronic abdominal pain, rectal bleeding, constipation or diarrhea.  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    Hypertension    Sleep apnea    does not use CPAP   Thyroid  disease    Wears glasses      Past Surgical History: Past Surgical History:  Procedure Laterality Date   HERNIA REPAIR     right inguinal hernia   IR CV LINE INJECTION  01/14/2017   IR FLUORO GUIDE PORT INSERTION RIGHT  12/25/2016   IR GASTROSTOMY TUBE MOD SED  12/25/2016   IR GASTROSTOMY TUBE REMOVAL  06/05/2017   IR REMOVAL TUN ACCESS W/ PORT W/O FL MOD SED  03/06/2017   IR US  GUIDE VASC ACCESS RIGHT  12/25/2016   LEFT HEART CATH AND CORONARY ANGIOGRAPHY N/A 12/07/2017   Procedure: LEFT HEART CATH AND CORONARY ANGIOGRAPHY;  Surgeon: Claudene Victory ORN, MD;  Location: [facility];  Service: Cardiovascular;  Laterality: N/A;   polyp removal  2008   during colonoscopy   TOTAL HIP ARTHROPLASTY Right  10/16/2015   Procedure: RIGHT TOTAL HIP ARTHROPLASTY ANTERIOR APPROACH;  Surgeon: Donnice Car, MD;  Location: [facility];  Service: Orthopedics;  Laterality: Right;   vocal cord polyp      removed 2-3 years ago     Allergies: Allergies[1]  Outpatient Meds: Current Outpatient Medications  Medication Sig Dispense Refill   amLODipine  (NORVASC ) 10 MG tablet TAKE 1 TABLET EVERY DAY FOR FOR BLOOD PRESSURE     aspirin  EC 81 MG tablet Take 81 mg by mouth daily.     atorvastatin  (LIPITOR) 40 MG tablet Take 1 tablet (40 mg total) by mouth daily. 90 tablet 3   B Complex CAPS TAKE 1 CAPSULE BY MOUTH EVERY DAY 100 capsule 2   escitalopram  (LEXAPRO ) 20 MG tablet TAKE 1 TABLET BY MOUTH EVERY DAY 90 tablet 0   ezetimibe  (ZETIA ) 10 MG tablet Take 1 tablet (10 mg total) by mouth daily. 90 tablet 0   fluticasone  furoate-vilanterol (BREO ELLIPTA ) 200-25 MCG/ACT AEPB Inhale 1 puff into the lungs daily. 1 each 11   Glucosamine 500 MG CAPS Take by mouth.     JARDIANCE 10 MG TABS tablet Take 10 mg by mouth daily.     metoprolol succinate (TOPROL-XL) 25 MG 24 hr tablet Take 25 mg by mouth daily.     omeprazole  (PRILOSEC) 20 MG capsule TAKE 1 CAPSULE BY MOUTH EVERY DAY 90 capsule 3  sacubitril-valsartan (ENTRESTO) 24-26 MG Take 1 tablet by mouth 2 (two) times daily.     silodosin (RAPAFLO) 8 MG CAPS capsule Take 8 mg by mouth daily with breakfast.     spironolactone (ALDACTONE) 25 MG tablet Take 12.5 mg by mouth daily.     Vitamin D , Ergocalciferol , (DRISDOL ) 1.25 MG (50000 UNIT) CAPS capsule TAKE 1 CAPSULE (50,000 UNITS TOTAL) BY MOUTH EVERY 7 (SEVEN) DAYS 13 capsule 3   acetaminophen  (TYLENOL ) 500 MG tablet Take 1,000 mg by mouth daily as needed for moderate pain or headache.     albuterol  (ACCUNEB ) 1.25 MG/3ML nebulizer solution Take 3 mLs (1.25 mg total) by nebulization every 6 (six) hours as needed for wheezing. 75 mL 12   amoxicillin  (AMOXIL ) 500 MG tablet Take 2,000 mg by mouth once. Take 4 tablets one time  before dental procedure (Patient not taking: Reported on 01/12/2024)     calcium  carbonate (TUMS - DOSED IN MG ELEMENTAL CALCIUM ) 500 MG chewable tablet Chew 2 tablets by mouth daily as needed for indigestion or heartburn.     tamsulosin  (FLOMAX ) 0.4 MG CAPS capsule TAKE 2 CAPSULES (0.8 MG TOTAL) BY MOUTH AT BEDTIME. FOR URINE FLOW AND PROSTATE     Current Facility-Administered Medications  Medication Dose Route Frequency Provider Last Rate Last Admin   0.9 %  sodium chloride  infusion  500 mL Intravenous Continuous Danis, Victory CROME III, MD          ___________________________________________________________________ Objective   Exam:  BP 129/76   Pulse 62   Temp 97.6 F (36.4 C) (Temporal)   Ht 5' 9 (1.753 m)   Wt 186 lb (84.4 kg)   SpO2 95%   BMI 27.47 kg/m   CV: Heart sounds difficult to auscultate. pectus excavatum (Sinus rhythm on monitor). Resp: clear to auscultation bilaterally, normal RR and effort noted GI: soft, no tenderness, with active bowel sounds.   Assessment: Encounter Diagnosis  Name Primary?   Hx of colonic polyps Yes     Plan: Colonoscopy   The benefits and risks of the planned procedure(s) were described in detail with the patient or (when appropriate) their health care proxy.  Risks were outlined as including, but not limited to, bleeding, infection, perforation, adverse medication reaction leading to cardiac or pulmonary decompensation, pancreatitis (if ERCP).  The limitation of incomplete mucosal visualization was also discussed.  No guarantees or warranties were given.  The patient was provided an opportunity to ask questions and all were answered. The patient agreed with the plan.   The patient is appropriate for an endoscopic procedure in the ambulatory setting.   - Victory Brand, MD        [1]  Allergies Allergen Reactions   Crestor  [Rosuvastatin ]     Muscle aches

## 2024-02-04 NOTE — Progress Notes (Signed)
 Pt's states no medical or surgical changes since previsit or office visit.

## 2024-02-05 ENCOUNTER — Telehealth: Payer: Self-pay | Admitting: *Deleted

## 2024-02-05 ENCOUNTER — Inpatient Hospital Stay (HOSPITAL_COMMUNITY): Admission: RE | Admit: 2024-02-05 | Discharge: 2024-02-05 | Attending: Internal Medicine

## 2024-02-05 DIAGNOSIS — Z952 Presence of prosthetic heart valve: Secondary | ICD-10-CM

## 2024-02-05 DIAGNOSIS — I5022 Chronic systolic (congestive) heart failure: Secondary | ICD-10-CM

## 2024-02-05 NOTE — Telephone Encounter (Signed)
°  Follow up Call-     02/04/2024    8:30 AM  Call back number  Post procedure Call Back phone  # [phone]  Permission to leave phone message Yes     Patient questions:  Do you have a fever, pain , or abdominal swelling? No. Pain Score  0 *  Have you tolerated food without any problems? Yes.    Have you been able to return to your normal activities? Yes.    Do you have any questions about your discharge instructions: Diet   No. Medications  No. Follow up visit  No.  Do you have questions or concerns about your Care? No.  Actions: * If pain score is 4 or above: No action needed, pain <4.

## 2024-02-05 NOTE — Progress Notes (Addendum)
 Daily Session Note  Patient Details  Name: Terry Pacheco MRN: [id number] Date of Birth: Jan 17, 1951 Referring Provider:   Flowsheet Row CARDIAC REHAB PHASE II ORIENTATION from 11/03/2023 in [facility]  Referring Provider Barnet Cough MD    Encounter Date: 02/05/2024  Check In:  Session Check In - 02/05/24 1052       Check-In   Supervising physician immediately available to respond to emergencies See telemetry face sheet for immediately available MD    Location [facility]    Staff Present Powell Benders, BS, Exercise Physiologist;Brittany Jackquline, BSN, RN, WTA-C;Shainna Faux Zina, RN    Virtual Visit No    Medication changes reported     No    Fall or balance concerns reported    No    Warm-up and Cool-down Performed on first and last piece of equipment    Resistance Training Performed Yes    VAD Patient? No    PAD/SET Patient? No      Pain Assessment   Currently in Pain? No/denies          Capillary Blood Glucose: No results found for this or any previous visit (from the past 24 hours).    Tobacco Use History[1]  Goals Met:  Independence with exercise equipment Exercise tolerated well No report of concerns or symptoms today Strength training completed today  Goals Unmet:  Not Applicable  Comments:  Deagen graduated today from  rehab with 36 sessions completed.  Details of the patient's exercise prescription and what He needs to do in order to continue the prescription and progress were discussed with patient.  Patient was given a copy of prescription and goals.  Patient verbalized understanding. Emilliano plans to continue to exercise by exercising at home.        [1]  Social History Tobacco Use  Smoking Status Former   Current packs/day: 0.00   Average packs/day: 0.5 packs/day for 45.0 years (22.5 ttl pk-yrs)   Types: Cigarettes   Start date: 01/12/1983   Quit date: 11/17/2016   Years since quitting: 7.2  Smokeless  Tobacco Never

## 2024-02-08 LAB — SURGICAL PATHOLOGY

## 2024-02-09 ENCOUNTER — Encounter (HOSPITAL_COMMUNITY)

## 2024-02-09 ENCOUNTER — Ambulatory Visit: Payer: Self-pay | Admitting: Gastroenterology

## 2024-02-11 ENCOUNTER — Encounter (HOSPITAL_COMMUNITY)

## 2024-02-16 ENCOUNTER — Encounter (HOSPITAL_COMMUNITY)

## 2024-02-16 NOTE — Progress Notes (Signed)
 Cardiac Individual Treatment Plan  Patient Details  Name: Terry Pacheco MRN: [id number] Date of Birth: [birth year] Referring Provider:   Flowsheet Row CARDIAC REHAB PHASE II ORIENTATION from 11/03/2023 in [place] [facility]  Referring Provider Barnet Cough MD    Initial Encounter Date:  Flowsheet Row CARDIAC REHAB PHASE II ORIENTATION from 11/03/2023 in [place] [facility]  Date 11/03/23    Visit Diagnosis: S/P TAVR (transcatheter aortic valve replacement)  Heart failure, chronic systolic (HCC)  Patient's Home Medications on Admission: Current Medications[1]  Past Medical History: Past Medical History:  Diagnosis Date   Anxiety    Aortic stenosis    Mild to moderate by echo 11/2016   Arthritis    Cancer (HCC) 10/2016   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    Hypertension    Sleep apnea    does not use CPAP   Thyroid  disease    Wears glasses     Tobacco Use: Tobacco Use History[2]  Labs: Review Flowsheet  More data exists      Latest Ref Rng & Units 04/18/2021 10/21/2021 04/22/2022 10/21/2022 04/21/2023  Labs for ITP Cardiac and Pulmonary Rehab  Cholestrol 100 - 199 mg/dL 892  889  880  882  895   LDL (calc) 0 - 99 mg/dL 49  45  49  44  44   HDL-C >39 mg/dL 44  54  58  61  47   Trlycerides 0 - 149 mg/dL 65  39  51  51  56      Exercise Target Goals: Exercise Program Goal: Individual exercise prescription set using results from initial 6 min walk test and THRR while considering  patients  activity barriers and safety.   Exercise Prescription Goal: Initial exercise prescription builds to 30-45 minutes a day of aerobic activity, 2-3 days per week.  Home exercise guidelines will be given to patient during program as part of exercise prescription that the participant will acknowledge.   Education: Aerobic Exercise: - Group verbal and visual presentation on the components of exercise prescription. Introduces F.I.T.T principle from ACSM for exercise prescriptions.  Reviews F.I.T.T. principles of aerobic exercise including progression. Written material provided at class time. Flowsheet Row CARDIAC REHAB PHASE II EXERCISE from 01/28/2024 in [place] [facility]  Education need identified 11/19/23    Education: Resistance Exercise: - Group verbal and visual presentation on the components of exercise prescription. Introduces F.I.T.T principle from ACSM for exercise prescriptions  Reviews F.I.T.T. principles of resistance exercise including progression. Written material provided at class time.    Education: Exercise & Equipment Safety: - Individual verbal instruction and demonstration of equipment use and safety with use of the equipment.   Education: Exercise Physiology & General Exercise Guidelines: - Group verbal and written instruction with models to review the exercise physiology of the cardiovascular system and associated critical values. Provides general exercise guidelines with specific guidelines to those with heart or lung disease. Written material provided at class time. Flowsheet Row CARDIAC REHAB PHASE II EXERCISE from 01/28/2024  in [facility]  Date 10/08/23  Educator hb  Instruction Review Code 2- Demonstrated Understanding    Education: Flexibility, Balance, Mind/Body Relaxation: - Group verbal and visual presentation with interactive activity on the components of exercise prescription. Introduces F.I.T.T principle from ACSM for  exercise prescriptions. Reviews F.I.T.T. principles of flexibility and balance exercise training including progression. Also discusses the mind body connection.  Reviews various relaxation techniques to help reduce and manage stress (i.e. Deep breathing, progressive muscle relaxation, and visualization). Balance handout provided to take home. Written material provided at class time. Flowsheet Row CARDIAC REHAB PHASE II EXERCISE from 01/28/2024 in [place] [facility]  Date 12/31/23  Educator HB  Instruction Review Code 1- Verbalizes Understanding    Activity Barriers & Risk Stratification:  Activity Barriers & Cardiac Risk Stratification - 11/03/23 1031       Activity Barriers & Cardiac Risk Stratification   Activity Barriers Shortness of Breath;Deconditioning;Muscular Weakness;Right Hip Replacement;Arthritis;Balance Concerns    Cardiac Risk Stratification Moderate          6 Minute Walk:  6 Minute Walk     Row Name 11/03/23 1257 12/08/23 1040 01/28/24 1043     6 Minute Walk   Phase Initial Discharge Discharge   Distance 1230 feet -- 1630 feet   Distance Feet Change -- -- 400 ft   Walk Time 6 minutes -- 6 minutes   # of Rest Breaks 0 -- 0   MPH 2.33 -- 3.08   METS 2.67 -- 3.26   RPE 11 -- 13   Perceived Dyspnea  -- -- 0   VO2 Peak 9.36 -- 11.4   Symptoms No -- No   Resting HR 67 bpm -- 60 bpm   Resting BP 144/62 -- 140/80   Resting Oxygen Saturation  96 % -- 96 %   Exercise Oxygen Saturation  during 6 min walk 96 % -- 96 %   Max Ex. HR 96 bpm -- 106 bpm   Max Ex. BP 154/74 -- 140/70   2 Minute Post BP 132/70 -- --      Oxygen Initial Assessment:   Oxygen Re-Evaluation:   Oxygen Discharge (Final Oxygen Re-Evaluation):   Initial Exercise Prescription:  Initial Exercise Prescription - 11/03/23 1300       Date of Initial Exercise RX and Referring Provider   Date 11/03/23    Referring Provider Barnet Cough MD      Oxygen   Maintain Oxygen  Saturation 88% or higher      Treadmill   MPH 2.2    Grade 1    Minutes 15    METs 2.99      REL-XR   Level 3    Speed 50    Minutes 15    METs 3      Prescription Details   Frequency (times per week) 2    Duration Progress to 30 minutes of continuous aerobic without signs/symptoms of physical distress      Intensity   THRR 40-80% of Max Heartrate 99-131    Ratings of Perceived Exertion 11-13    Perceived Dyspnea 0-4      Progression   Progression Continue to progress workloads to maintain intensity without signs/symptoms of physical distress.      Resistance Training   Training Prescription Yes    Weight 4 lb    Reps 10-15          Perform Capillary Blood Glucose checks as needed.  Exercise Prescription  Changes:   Exercise Prescription Changes     Row Name 09/08/23 1500 09/24/23 1500 10/21/23 1300 11/03/23 1300 11/17/23 1500     Response to Exercise   Blood Pressure (Admit) 118/60 124/72 108/60 144/62 104/70   Blood Pressure (Exercise) 132/72 -- -- 152/74 --   Blood Pressure (Exit) 112/60 100/74 128/72 132/70 122/70   Heart Rate (Admit) 76 bpm 65 bpm 68 bpm 67 bpm 81 bpm   Heart Rate (Exercise) 86 bpm 80 bpm 84 bpm 96 bpm 104 bpm   Heart Rate (Exit) 78 bpm 74 bpm 78 bpm 71 bpm 83 bpm   Oxygen Saturation (Admit) 95 % 94 % 94 % 95 % --   Oxygen Saturation (Exercise) 90 % 92 % 92 % 96 % --   Oxygen Saturation (Exit) 94 % 93 % 94 % -- --   Rating of Perceived Exertion (Exercise) 13 12 11 11 12    Perceived Dyspnea (Exercise) 2 1 1  -- --   Symptoms -- -- -- none --   Comments -- -- -- walk test results --   Duration Continue with 30 min of aerobic exercise without signs/symptoms of physical distress. Continue with 30 min of aerobic exercise without signs/symptoms of physical distress. Continue with 30 min of aerobic exercise without signs/symptoms of physical distress. -- Continue with 30 min of aerobic exercise without signs/symptoms of physical distress.    Intensity THRR unchanged THRR unchanged THRR unchanged -- THRR unchanged     Progression   Progression Continue to progress workloads to maintain intensity without signs/symptoms of physical distress. Continue to progress workloads to maintain intensity without signs/symptoms of physical distress. Continue to progress workloads to maintain intensity without signs/symptoms of physical distress. -- Continue to progress workloads to maintain intensity without signs/symptoms of physical distress.     Resistance Training   Training Prescription Yes Yes Yes -- Yes   Weight 4 4 4  -- 4   Reps 10-15 10-15 10-15 -- 10-15     Treadmill   MPH 2 2 2.7 -- 2.5   Grade 1 0 0 -- 2   Minutes 15 15 15  -- 15   METs 2.81 2.53 3.07 -- 3.6     NuStep   Level 4 5 5  -- --   SPM 85 81 94 -- --   Minutes 15 15 15  -- --   METs 2.5 2.6 3 -- --     REL-XR   Level -- -- -- -- 5   Speed -- -- -- -- 70   Minutes -- -- -- -- 15   METs -- -- -- -- 1.8    Row Name 11/19/23 1000 12/01/23 1500 12/22/23 1300 01/12/24 1500 01/26/24 1500     Response to Exercise   Blood Pressure (Admit) -- 136/74 138/70 100/60 110/60   Blood Pressure (Exit) -- 118/70 128/70 106/70 108/60   Heart Rate (Admit) -- 71 bpm 70 bpm 62 bpm 83 bpm   Heart Rate (Exercise) -- 93 bpm 124 bpm 100 bpm 104 bpm   Heart Rate (Exit) -- 78 bpm 112 bpm 71 bpm 91 bpm   Rating of Perceived Exertion (Exercise) -- 15 15 13 15    Duration -- Continue with 30 min of aerobic exercise without signs/symptoms of physical distress. Continue with 30 min of aerobic exercise without signs/symptoms of physical distress. Continue with 30 min of aerobic exercise without signs/symptoms of physical distress. Continue with 30 min of aerobic exercise without signs/symptoms of physical distress.   Intensity --  THRR unchanged THRR unchanged THRR unchanged THRR unchanged     Progression   Progression -- Continue to progress workloads to maintain intensity without  signs/symptoms of physical distress. Continue to progress workloads to maintain intensity without signs/symptoms of physical distress. Continue to progress workloads to maintain intensity without signs/symptoms of physical distress. Continue to progress workloads to maintain intensity without signs/symptoms of physical distress.     Resistance Training   Training Prescription -- Yes Yes Yes Yes   Weight -- 4 4 5 5    Reps -- 10-15 10-15 10-15 10-15     Treadmill   MPH -- 3.3 2.7 3.1 3.3   Grade -- 5.5 2 3 3    Minutes -- 15 15 15 15    METs -- 6.03 3.81 4.66 4.89     REL-XR   Level -- 4 4 4 4    Speed -- 46 48 51 54   Minutes -- 15 15 15 15    METs -- 3.7 3 2.7 4.7     Home Exercise Plan   Plans to continue exercise at Home (comment) Home (comment) Home (comment) Home (comment) Home (comment)   Frequency Add 3 additional days to program exercise sessions. Add 3 additional days to program exercise sessions. Add 3 additional days to program exercise sessions. Add 3 additional days to program exercise sessions. Add 3 additional days to program exercise sessions.   Initial Home Exercises Provided 11/19/23 -- -- -- --      Exercise Comments:   Exercise Comments     Row Name 11/05/23 1028 11/19/23 1044 02/05/24 1139       Exercise Comments First full day of exercise!  Patient was oriented to gym and equipment including functions, settings, policies, and procedures.  Patient's individual exercise prescription and treatment plan were reviewed.  All starting workloads were established based on the results of the 6 minute walk test done at initial orientation visit.  The plan for exercise progression was also introduced and progression will be customized based on patient's performance and goals reviewed home exercise with pt Linus graduated today from  rehab with 36 sessions completed.  Details of the patient's exercise prescription and what He needs to do in order to continue the prescription and  progress were discussed with patient.  Patient was given a copy of prescription and goals.  Patient verbalized understanding. Nehemiah plans to continue to exercise by exercising at home.        Exercise Goals and Review:   Exercise Goals Re-Evaluation :  Exercise Goals Re-Evaluation     Row Name 10/01/23 1303 11/05/23 1029 11/24/23 0821 12/17/23 1147 01/12/24 1045     Exercise Goal Re-Evaluation   Exercise Goals Review Increase Physical Activity;Increase Strength and Stamina;Understanding of Exercise Prescription Increase Physical Activity;Increase Strength and Stamina;Understanding of Exercise Prescription Increase Physical Activity;Increase Strength and Stamina;Understanding of Exercise Prescription Increase Physical Activity;Increase Strength and Stamina;Able to understand and use rate of perceived exertion (RPE) scale;Able to understand and use Dyspnea scale;Able to check pulse independently;Knowledge and understanding of Target Heart Rate Range (THRR);Understanding of Exercise Prescription Increase Physical Activity;Increase Strength and Stamina;Understanding of Exercise Prescription   Comments Ron is doing well in rehab. He is feeling good with exercise and is feeling an increase in his endurance. He is walking some at home due to heat and weather. He has 4lbs hand weights and resistance bands at home that he has been using. He is increasing his levels in rehab by walking speed on the treadmill and  increasing level on the nustep. Will continue to monitor and progress as able. Reviewed RPE and dyspnea scale, THR and program prescription with pt today.  Pt voiced understanding and was given a copy of goals to take home. Ron is doing well in CR. He has continued to increase walking speed and level on the XR since changing from PR  to CR. Will continue to montior and progress as able Ron is doing great in rehab! 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 He notes is a long walk and has hills  also. Ron is doing great in rehab! 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 He notes is a long walk and has hills also. He is doing great on the equipment here also as he is increasing his levels on the NuStep and treadmill as he can.   Expected Outcomes Short: increase hand weight lbs if able in the next week or two   long: continue to exercise at hoem and attend rehab Short: Use RPE daily to regulate intensity.  Long: Follow program prescription in THR. Short: increase grade on treadmill   long: continue to exericse Short: continue to attend rehab. Long: continue to exercise daily for at least 30 minutes. Short: continue to attend rehab. Long: continue to exercise daily for at least 30 minutes.    Row Name 01/14/24 9071 01/28/24 0831 02/02/24 1045         Exercise Goal Re-Evaluation   Exercise Goals Review Increase Physical Activity;Increase Strength and Stamina;Understanding of Exercise Prescription Increase Physical Activity;Increase Strength and Stamina;Understanding of Exercise Prescription Increase Physical Activity;Increase Strength and Stamina;Understanding of Exercise Prescription     Comments Ros has completed 28 sessions of CR. He is getting close to the end and will be doing his walk test soon and then come off the montior for the remainder of sessions. He is increasing his speed on the treadmill and is at a fast walk but we do not want hos to run on the . Ron has completed32 sessions of CR. He is close to the end and will be doing his walk test and graduating soon. He continues to increase levels on the equipment and has done wellin rehab Ron is about to graduate [facility] rehab. He has been doing great in the program. He is joining [employer] at his local [employer] with his wife and they will go together.     Expected Outcomes Short: continue to attend rehab   long: continue with exercise outside of rehab Short: continue to attend rehab   long: continue with exercise outside of rehab  Continue to exericse        Discharge Exercise Prescription (Final Exercise Prescription Changes):  Exercise Prescription Changes - 01/26/24 1500       Response to Exercise   Blood Pressure (Admit) 110/60    Blood Pressure (Exit) 108/60    Heart Rate (Admit) 83 bpm    Heart Rate (Exercise) 104 bpm    Heart Rate (Exit) 91 bpm    Rating of Perceived Exertion (Exercise) 15    Duration Continue with 30 min of aerobic exercise without signs/symptoms of physical distress.    Intensity THRR unchanged      Progression   Progression Continue to progress workloads to maintain intensity without signs/symptoms of physical distress.      Resistance Training   Training Prescription Yes    Weight 5    Reps 10-15      Treadmill  MPH 3.3    Grade 3    Minutes 15    METs 4.89      REL-XR   Level 4    Speed 54    Minutes 15    METs 4.7      Home Exercise Plan   Plans to continue exercise at Home (comment)    Frequency Add 3 additional days to program exercise sessions.          Nutrition:  Target Goals: Understanding of nutrition guidelines, daily intake of sodium 1500mg , cholesterol 200mg , calories 30% from fat and 7% or less from saturated fats, daily to have 5 or more servings of fruits and vegetables.  Education: Nutrition 1 -Group instruction provided by verbal, written material, interactive activities, discussions, models, and posters to present general guidelines for heart healthy nutrition including macronutrients, label reading, and promoting whole foods over processed counterparts. Education serves as pensions consultant of discussion of heart healthy eating for all. Written material provided at class time. Flowsheet Row CARDIAC REHAB PHASE II EXERCISE from 01/28/2024 in [place] [facility]  Education need identified 11/19/23  Date 11/05/23  Educator jh  Instruction Review Code 2- Demonstrated Understanding     Education: Nutrition 2 -Group instruction  provided by verbal, written material, interactive activities, discussions, models, and posters to present general guidelines for heart healthy nutrition including sodium, cholesterol, and saturated fat. Providing guidance of habit forming to improve blood pressure, cholesterol, and body weight. Written material provided at class time. Flowsheet Row CARDIAC REHAB PHASE II EXERCISE from 01/28/2024 in [place] [facility]  Education need identified 11/19/23  Date 11/05/23  Educator jh  Instruction Review Code 2- Demonstrated Understanding      Biometrics:  Pre Biometrics - 11/03/23 1316       Pre Biometrics   Height 5' 7 (1.702 m)    Weight 82.2 kg    Waist Circumference 38 inches    Hip Circumference 39 inches    Waist to Hip Ratio 0.97 %    BMI (Calculated) 28.39    Grip Strength 18.6 kg          Post Biometrics - 01/28/24 1044        Post  Biometrics   Height 5' 7 (1.702 m)    Weight 88.7 kg    Waist Circumference 40 inches    Hip Circumference 39 inches    Waist to Hip Ratio 1.03 %    BMI (Calculated) 30.62    Grip Strength 23.5 kg    Single Leg Stand 30 seconds          Nutrition Therapy Plan and Nutrition Goals:  Nutrition Therapy & Goals - 11/03/23 1325       Intervention Plan   Intervention Prescribe, educate and counsel regarding individualized specific dietary modifications aiming towards targeted core components such as weight, hypertension, lipid management, diabetes, heart failure and other comorbidities.;Nutrition handout(s) given to patient.    Expected Outcomes Short Term Goal: Understand basic principles of dietary content, such as calories, fat, sodium, cholesterol and nutrients.;Long Term Goal: Adherence to prescribed nutrition plan.          Nutrition Assessments:  MEDIFICTS Score Key: >=70 Need to make dietary changes  40-70 Heart Healthy Diet <= 40 Therapeutic Level Cholesterol Diet  Flowsheet Row CARDIAC REHAB PHASE II  EXERCISE from 02/02/2024 in [facility]  Picture Your Plate Total Score on Discharge 54   Picture Your Plate Scores: <59 Unhealthy dietary pattern with  much room for improvement. 41-50 Dietary pattern unlikely to meet recommendations for good health and room for improvement. 51-60 More healthful dietary pattern, with some room for improvement.  >60 Healthy dietary pattern, although there may be some specific behaviors that could be improved.    Nutrition Goals Re-Evaluation:  Nutrition Goals Re-Evaluation     Row Name 10/01/23 1309 12/17/23 1139 01/12/24 1043 02/02/24 1047       Goals   Nutrition Goal healthy eating healthy eating healthy eating healthy eating    Comment Ron is doing well in rehab. He did not have a good apitie for a while but has finally got this back. He has gain a few lbs over the part month since getting his apitie back. His wife is ooking healthy options. Ron is a sweets lovers and does enjoy a nutty buddy or 2 a day. He is drinking mostly water, does not drink a lot of soads and only will have tea in a resturant Ron's appetite is much much better. He notes gaining more weight back because he is eating more, which he feels much better. He notes incorporating more protein also, and notes he doesn't really have to watch his salt, because his sodium has actually ran low. Ron is doing well in rehab. He states he is trying to cut down on sweets as he eats too many. Told him to try and eat those in moderation. Ron is doing well in rehab and is graduation next visit. HE is keeping up with healthy eating and is trying to eat his sweets in moderation    Expected Outcome Short: cut back on nutty buddies each day   long : continue to drnk water and pick healthy options Short: Continue to eat more protein. Long: continue eating healthy, well balanced meals. Short: Continue to drink enough water. Long: Eat sweets in moderation. Short: Continue to drink enough water.  Long: Eat sweets in moderation.       Nutrition Goals Discharge (Final Nutrition Goals Re-Evaluation):  Nutrition Goals Re-Evaluation - 02/02/24 1047       Goals   Nutrition Goal healthy eating    Comment Ron is doing well in rehab and is graduation next visit. HE is keeping up with healthy eating and is trying to eat his sweets in moderation    Expected Outcome Short: Continue to drink enough water. Long: Eat sweets in moderation.          Psychosocial: Target Goals: Acknowledge presence or absence of significant depression and/or stress, maximize coping skills, provide positive support system. Participant is able to verbalize types and ability to use techniques and skills needed for reducing stress and depression.   Education: Stress, Anxiety, and Depression - Group verbal and visual presentation to define topics covered.  Reviews how body is impacted by stress, anxiety, and depression.  Also discusses healthy ways to reduce stress and to treat/manage anxiety and depression. Written material provided at class time. Flowsheet Row CARDIAC REHAB PHASE II EXERCISE from 01/28/2024 in [place] [facility]  Date 11/12/23  Educator HB  Instruction Review Code 1- [employer] Understanding    Education: Sleep Hygiene -Provides group verbal and written instruction about how sleep can affect your health.  Define sleep hygiene, discuss sleep cycles and impact of sleep habits. Review good sleep hygiene tips.   Initial Review & Psychosocial Screening:  Initial Psych Review & Screening - 11/03/23 1320       Initial Review   Current issues with Current  Sleep Concerns;Current Psychotropic Meds;Current Stress Concerns    Source of Stress Concerns Chronic Illness;Unable to participate in former interests or hobbies;Unable to perform yard/household activities    Comments new TAVR surgery and eager to return to rehab.      Family Dynamics   Good Support System? Yes   wife      Barriers   Psychosocial barriers to participate in program Psychosocial barriers identified (see note);The patient should benefit from training in stress management and relaxation.      Screening Interventions   Interventions Encouraged to exercise;To provide support and resources with identified psychosocial needs;Provide feedback about the scores to participant    Expected Outcomes Short Term goal: Utilizing psychosocial counselor, staff and physician to assist with identification of specific Stressors or current issues interfering with healing process. Setting desired goal for each stressor or current issue identified.;Long Term Goal: Stressors or current issues are controlled or eliminated.;Short Term goal: Identification and review with participant of any Quality of Life or Depression concerns found by scoring the questionnaire.;Long Term goal: The participant improves quality of Life and PHQ9 Scores as seen by post scores and/or verbalization of changes          Quality of Life Scores:   Quality of Life - 02/02/24 1100       Quality of Life   Select Quality of Life      Quality of Life Scores   Health/Function Pre 18.8 %    Health/Function Post 27.2 %    Health/Function % Change 44.68 %    Socioeconomic Pre 29.25 %    Socioeconomic Post 30 %    Socioeconomic % Change  2.56 %    Psych/Spiritual Pre 30 %    Psych/Spiritual Post 29.14 %    Psych/Spiritual % Change -2.87 %    Family Pre 19.2 %    Family Post 34 %    Family % Change 77.08 %    GLOBAL Pre 23.49 %    GLOBAL Post 27.71 %    GLOBAL % Change 17.97 %         Scores of 19 and below usually indicate a poorer quality of life in these areas.  A difference of  2-3 points is a clinically meaningful difference.  A difference of 2-3 points in the total score of the Quality of Life Index has been associated with significant improvement in overall quality of life, self-image, physical symptoms, and general health in studies  assessing change in quality of life.  PHQ-9: Review Flowsheet  More data exists      02/02/2024 11/03/2023 08/17/2023 04/21/2023 01/28/2023  Depression screen PHQ 2/9  Decreased Interest 0 0 0 0 0  Down, Depressed, Hopeless 0 0 0 0 0  PHQ - 2 Score 0 0 0 0 0  Altered sleeping 0 0 3 0 -  Tired, decreased energy 0 0 1 0 -  Change in appetite 0 0 2 0 -  Feeling bad or failure about yourself  0 0 0 0 -  Trouble concentrating 0 0 0 0 -  Moving slowly or fidgety/restless 0 0 0 0 -  Suicidal thoughts 0 0 0 0 -  PHQ-9 Score 0 0  6  0  -  Difficult doing work/chores - Somewhat difficult Somewhat difficult Not difficult at all -    Details       Data saved with a previous flowsheet row definition        Interpretation of Total Score  Total Score Depression Severity:  1-4 = Minimal depression, 5-9 = Mild depression, 10-14 = Moderate depression, 15-19 = Moderately severe depression, 20-27 = Severe depression   Psychosocial Evaluation and Intervention:  Psychosocial Evaluation - 11/03/23 1321       Psychosocial Evaluation & Interventions   Interventions Stress management education;Relaxation education;Encouraged to exercise with the program and follow exercise prescription    Comments Ron is returning to cardiac rehab.  He was in pulmonary but had heart surgery and is now in cardiac rehab.  He attended today for orientation and we updated his information.  He is still on Lexapro  and feeling well managed.  He is sleeping little better than prior to surgery.  He is still not good with wearing his CPAP at night.  He is looking forward to getting back to rehab again as he noticed that it did make him feel better overall.    Expected Outcomes Short: Attend rehab to get moving again Long: Continue to stay positive    Continue Psychosocial Services  Follow up required by staff          Psychosocial Re-Evaluation:  Psychosocial Re-Evaluation     Row Name 10/01/23 1307 12/17/23 1137 01/12/24  1042 02/02/24 1046       Psychosocial Re-Evaluation   Current issues with Current Sleep Concerns Current Sleep Concerns None Identified --    Comments Ron is doing well in rehab. He stated that he does have some trouble sleeping. He does have an elevated bed that sits hip up enough with his pillow that he is able to sleep well. He has a sleep study done about 10 years ago and they wanted him to use a cpap but he did not want to. He does get up some during the night to use thye restroom but that is it. Ron doesn't complain of any stressors. He notes his sleep is much better, he is still sleeping with his HOB elevated and he says that helps him so much, he doesn't think he could ever sleep flat. Ron is doing well in rehab. He doesn't complain of any sleep or stress concerns. Ron is doing well in rehab. He doesn't complain of any sleep or stress concerns. He is graduation next visit and has enjoyed coming    Expected Outcomes Short: montior sleep if it gets worse contact MD    long: continue to have no stressors Short: montior sleep if it gets worse contact MD    long: continue to have no stressors Short: Continue to attend rehab. Long: Continue to have no stressors. Short: Continue to attend rehab. Long: Continue to have no stressors.    Interventions Encouraged to attend Pulmonary Rehabilitation for the exercise Encouraged to attend Cardiac Rehabilitation for the exercise Encouraged to attend Cardiac Rehabilitation for the exercise --    Continue Psychosocial Services  Follow up required by staff Follow up required by staff Follow up required by staff --       Psychosocial Discharge (Final Psychosocial Re-Evaluation):  Psychosocial Re-Evaluation - 02/02/24 1046       Psychosocial Re-Evaluation   Comments Ron is doing well in rehab. He doesn't complain of any sleep or stress concerns. He is graduation next visit and has enjoyed coming    Expected Outcomes Short: Continue to attend rehab. Long:  Continue to have no stressors.          Vocational Rehabilitation: Provide vocational rehab assistance to qualifying candidates.   Vocational Rehab Evaluation & Intervention:  Vocational Rehab - 11/03/23 1326       Initial Vocational Rehab Evaluation & Intervention   Assessment shows need for Vocational Rehabilitation No   retired         Education: Education Goals: Education classes will be provided on a variety of topics geared toward better understanding of heart health and risk factor modification. Participant will state understanding/return demonstration of topics presented as noted by education test scores.  Learning Barriers/Preferences:  Learning Barriers/Preferences - 11/03/23 1326       Learning Barriers/Preferences   Learning Barriers None    Learning Preferences Skilled Demonstration;Verbal Instruction          General Cardiac Education Topics:  AED/CPR: - Group verbal and written instruction with the use of models to demonstrate the basic use of the AED with the basic ABC's of resuscitation.   Test and Procedures: - Group verbal and visual presentation and models provide information about basic cardiac anatomy and function. Reviews the testing methods done to diagnose heart disease and the outcomes of the test results. Describes the treatment choices: Medical Management, Angioplasty, or Coronary Bypass Surgery for treating various heart conditions including Myocardial Infarction, Angina, Valve Disease, and Cardiac Arrhythmias. Written material provided at class time. Flowsheet Row CARDIAC REHAB PHASE II EXERCISE from 01/28/2024 in [place] [facility]  Date 10/15/23  Educator [facility]  Instruction Review Code 2- Demonstrated Understanding    Medication Safety: - Group verbal and visual instruction to review commonly prescribed medications for heart and lung disease. Reviews the medication, class of the drug, and side effects. Includes the steps to  properly store meds and maintain the prescription regimen. Written material provided at class time. Flowsheet Row CARDIAC REHAB PHASE II EXERCISE from 01/28/2024 in [place] [facility]  Date 11/26/23  Educator [facility]  Instruction Review Code 1- Verbalizes Understanding    Intimacy: - Group verbal instruction through game format to discuss how heart and lung disease can affect sexual intimacy. Written material provided at class time.   Know Your Numbers and Heart Failure: - Group verbal and visual instruction to discuss disease risk factors for cardiac and pulmonary disease and treatment options.  Reviews associated critical values for Overweight/Obesity, Hypertension, Cholesterol, and Diabetes.  Discusses basics of heart failure: signs/symptoms and treatments.  Introduces Heart Failure Zone chart for action plan for heart failure. Written material provided at class time. Flowsheet Row CARDIAC REHAB PHASE II EXERCISE from 01/28/2024 in [place] [facility]  Instruction Review Code 1- Verbalizes Understanding    Infection Prevention: - Provides verbal and written material to individual with discussion of infection control including proper hand washing and proper equipment cleaning during exercise session.   Falls Prevention: - Provides verbal and written material to individual with discussion of falls prevention and safety.   Other: -Provides group and verbal instruction on various topics (see comments)   Knowledge Questionnaire Score:  Knowledge Questionnaire Score - 02/02/24 1056       Knowledge Questionnaire Score   Post Score 21/24          Core Components/Risk Factors/Patient Goals at Admission:  Personal Goals and Risk Factors at Admission - 11/03/23 1326       Core Components/Risk Factors/Patient Goals on Admission    Weight Management Weight Maintenance;Yes    Intervention Weight Management: Develop a combined nutrition and exercise program  designed to reach desired caloric intake, while maintaining appropriate intake of nutrient and fiber, sodium and fats, and appropriate energy expenditure required for  the weight goal.;Weight Management: Provide education and appropriate resources to help participant work on and attain dietary goals.    Admit Weight 181 lb 4.8 oz (82.2 kg)    Goal Weight: Short Term 180 lb (81.6 kg)    Goal Weight: Long Term 180 lb (81.6 kg)    Expected Outcomes Short Term: Continue to assess and modify interventions until short term weight is achieved;Long Term: Adherence to nutrition and physical activity/exercise program aimed toward attainment of established weight goal;Weight Maintenance: Understanding of the daily nutrition guidelines, which includes 25-35% calories from fat, 7% or less cal from saturated fats, less than 200mg  cholesterol, less than 1.5gm of sodium, & 5 or more servings of fruits and vegetables daily    Improve shortness of breath with ADL's Yes    Intervention Provide education, individualized exercise plan and daily activity instruction to help decrease symptoms of SOB with activities of daily living.    Expected Outcomes Short Term: Improve cardiorespiratory fitness to achieve a reduction of symptoms when performing ADLs;Long Term: Be able to perform more ADLs without symptoms or delay the onset of symptoms    Heart Failure Yes    Intervention Provide a combined exercise and nutrition program that is supplemented with education, support and counseling about heart failure. Directed toward relieving symptoms such as shortness of breath, decreased exercise tolerance, and extremity edema.    Expected Outcomes Improve functional capacity of life;Short term: Daily weights obtained and reported for increase. Utilizing diuretic protocols set by physician.;Long term: Adoption of self-care skills and reduction of barriers for early signs and symptoms recognition and intervention leading to self-care  maintenance.;Short term: Attendance in program 2-3 days a week with increased exercise capacity. Reported lower sodium intake. Reported increased fruit and vegetable intake. Reports medication compliance.    Hypertension Yes    Intervention Provide education on lifestyle modifcations including regular physical activity/exercise, weight management, moderate sodium restriction and increased consumption of fresh fruit, vegetables, and low fat dairy, alcohol moderation, and smoking cessation.;Monitor prescription use compliance.    Expected Outcomes Short Term: Continued assessment and intervention until BP is < 140/86mm HG in hypertensive participants. < 130/19mm HG in hypertensive participants with diabetes, heart failure or chronic kidney disease.;Long Term: Maintenance of blood pressure at goal levels.    Lipids Yes    Intervention Provide education and support for participant on nutrition & aerobic/resistive exercise along with prescribed medications to achieve LDL 70mg , HDL >40mg .    Expected Outcomes Short Term: Participant states understanding of desired cholesterol values and is compliant with medications prescribed. Participant is following exercise prescription and nutrition guidelines.;Long Term: Cholesterol controlled with medications as prescribed, with individualized exercise RX and with personalized nutrition plan. Value goals: LDL < 70mg , HDL > 40 mg.          Education:Diabetes - Individual verbal and written instruction to review signs/symptoms of diabetes, desired ranges of glucose level fasting, after meals and with exercise. Acknowledge that pre and post exercise glucose checks will be done for 3 sessions at entry of program.   Core Components/Risk Factors/Patient Goals Review:   Goals and Risk Factor Review     Row Name 10/01/23 1352 12/17/23 1142 01/12/24 1045         Core Components/Risk Factors/Patient Goals Review   Personal Goals Review Weight  Management/Obesity;Improve shortness of breath with ADL's Weight Management/Obesity;Improve shortness of breath with ADL's Weight Management/Obesity;Improve shortness of breath with ADL's     Review Ron is doing well in rehab.  He has gain a little weight the past month due to gaining his apitite back. He has no stess and has been working on his sleep. Ron notes no changes in medications, and the meds he takes are working great. He is checking his blood pressure daily at home and they are looking good. Ron notes no changes in medications, and the meds he takes are working great. He is checking his blood pressure daily at home and they are looking good.     Expected Outcomes Short: monitor food and weight for managment  long: keep exercise for overall happienss Short: Continue to take medications as prescribed by MD. Long: Keep exercising for overall happiness. Short: Continue to take medications as prescribed by MD. Long: Keep exercising for overall happiness.        Core Components/Risk Factors/Patient Goals at Discharge (Final Review):   Goals and Risk Factor Review - 01/12/24 1045       Core Components/Risk Factors/Patient Goals Review   Personal Goals Review Weight Management/Obesity;Improve shortness of breath with ADL's    Review Ron notes no changes in medications, and the meds he takes are working great. He is checking his blood pressure daily at home and they are looking good.    Expected Outcomes Short: Continue to take medications as prescribed by MD. Long: Keep exercising for overall happiness.          ITP Comments:  ITP Comments     Row Name 11/02/23 0953 11/03/23 1247 11/04/23 1503 11/05/23 1028 11/19/23 1044   ITP Comments Patient had TAVR 10/22/23 and has cardiac rehab referral. Discharging from [facility]. Patient attend orientation today.  Patient is attending Cardiac Rehabilitation Program.  Documentation for diagnosis can be found in [facility] 10/22/23.  Reviewed medical chart,  RPE/RPD, gym safety, and program guidelines.  Patient was fitted to equipment they will be using during rehab.  Patient is scheduled to start exercise on Thursday 11/05/23 at 1030.   Initial ITP created and sent for review and signature by Dr. Dorn Ross, Medical Director for Cardiac Rehabilitation Program. 30 day review completed. ITP sent to Dr. Dorn Ross, Medical Director of Cardiac Rehab. Continue with ITP unless changes are made by physician.  He just oriented for cardiac rehab yesterday. First full day of exercise!  Patient was oriented to gym and equipment including functions, settings, policies, and procedures.  Patient's individual exercise prescription and treatment plan were reviewed.  All starting workloads were established based on the results of the 6 minute walk test done at initial orientation visit.  The plan for exercise progression was also introduced and progression will be customized based on patient's performance and goals reviewed home exercise with pt    Row Name 12/02/23 1524 12/30/23 0850 01/26/24 1045 02/05/24 1138     ITP Comments 30 day review completed. ITP sent to Dr. Dorn Ross, Medical Director of Cardiac Rehab. Continue with ITP unless changes are made by physician. 30 day review completed. ITP sent to Dr. Dorn Ross, Medical Director of Cardiac Rehab. Continue with ITP unless changes are made by physician. 30 day review completed. ITP sent to Dr. Dorn Ross, Medical Director of Cardiac Rehab. Continue with ITP unless changes are made by physician. Kaige graduated today from  rehab with 36 sessions completed.  Details of the patient's exercise prescription and what He needs to do in order to continue the prescription and progress were discussed with patient.  Patient was given a copy of prescription and goals.  Patient  verbalized understanding. Lavert plans to continue to exercise by exercising at home.       Comments: Discharge ITP    [1]   Current Outpatient Medications:    acetaminophen  (TYLENOL ) 500 MG tablet, Take 1,000 mg by mouth daily as needed for moderate pain or headache., Disp: , Rfl:    albuterol  (ACCUNEB ) 1.25 MG/3ML nebulizer solution, Take 3 mLs (1.25 mg total) by nebulization every 6 (six) hours as needed for wheezing., Disp: 75 mL, Rfl: 12   amLODipine  (NORVASC ) 10 MG tablet, TAKE 1 TABLET EVERY DAY FOR FOR BLOOD PRESSURE, Disp: , Rfl:    amoxicillin  (AMOXIL ) 500 MG tablet, Take 2,000 mg by mouth once. Take 4 tablets one time before dental procedure (Patient not taking: Reported on 01/12/2024), Disp: , Rfl:    aspirin  EC 81 MG tablet, Take 81 mg by mouth daily., Disp: , Rfl:    atorvastatin  (LIPITOR) 40 MG tablet, Take 1 tablet (40 mg total) by mouth daily., Disp: 90 tablet, Rfl: 3   B Complex CAPS, TAKE 1 CAPSULE BY MOUTH EVERY DAY, Disp: 100 capsule, Rfl: 2   calcium  carbonate (TUMS - DOSED IN MG ELEMENTAL CALCIUM ) 500 MG chewable tablet, Chew 2 tablets by mouth daily as needed for indigestion or heartburn., Disp: , Rfl:    escitalopram  (LEXAPRO ) 20 MG tablet, TAKE 1 TABLET BY MOUTH EVERY DAY, Disp: 90 tablet, Rfl: 0   ezetimibe  (ZETIA ) 10 MG tablet, Take 1 tablet (10 mg total) by mouth daily., Disp: 90 tablet, Rfl: 0   fluticasone  furoate-vilanterol (BREO ELLIPTA ) 200-25 MCG/ACT AEPB, Inhale 1 puff into the lungs daily., Disp: 1 each, Rfl: 11   Glucosamine 500 MG CAPS, Take by mouth., Disp: , Rfl:    JARDIANCE 10 MG TABS tablet, Take 10 mg by mouth daily., Disp: , Rfl:    metoprolol succinate (TOPROL-XL) 25 MG 24 hr tablet, Take 25 mg by mouth daily., Disp: , Rfl:    omeprazole  (PRILOSEC) 20 MG capsule, TAKE 1 CAPSULE BY MOUTH EVERY DAY, Disp: 90 capsule, Rfl: 3   sacubitril-valsartan (ENTRESTO) 24-26 MG, Take 1 tablet by mouth 2 (two) times daily., Disp: , Rfl:    silodosin (RAPAFLO) 8 MG CAPS capsule, Take 8 mg by mouth daily with breakfast., Disp: , Rfl:    spironolactone (ALDACTONE) 25 MG tablet, Take 12.5 mg  by mouth daily., Disp: , Rfl:    tamsulosin  (FLOMAX ) 0.4 MG CAPS capsule, TAKE 2 CAPSULES (0.8 MG TOTAL) BY MOUTH AT BEDTIME. FOR URINE FLOW AND PROSTATE, Disp: , Rfl:    Vitamin D , Ergocalciferol , (DRISDOL ) 1.25 MG (50000 UNIT) CAPS capsule, TAKE 1 CAPSULE (50,000 UNITS TOTAL) BY MOUTH EVERY 7 (SEVEN) DAYS, Disp: 13 capsule, Rfl: 3 [2]  Social History Tobacco Use  Smoking Status Former   Current packs/day: 0.00   Average packs/day: 0.5 packs/day for 45.0 years (22.5 ttl pk-yrs)   Types: Cigarettes   Start date: 01/12/1983   Quit date: 11/17/2016   Years since quitting: 7.2  Smokeless Tobacco Never

## 2024-02-16 NOTE — Progress Notes (Signed)
 Discharge Progress Report  Patient Details  Name: Terry Pacheco MRN: [id number] Date of Birth: 06-Aug-1950 Referring Provider:   Flowsheet Row CARDIAC REHAB PHASE II ORIENTATION from 11/03/2023 in [facility] CARDIAC REHABILITATION  Referring Provider Barnet Cough MD     Number of Visits: 36  Reason for Discharge:  Patient reached a stable level of exercise. Patient independent in their exercise. Patient has met program and personal goals.  Smoking History:  Tobacco Use History[1]  Diagnosis:  S/P TAVR (transcatheter aortic valve replacement)  Heart failure, chronic systolic (HCC)    Initial Exercise Prescription:  Initial Exercise Prescription - 11/03/23 1300       Date of Initial Exercise RX and Referring Provider   Date 11/03/23    Referring Provider Barnet Cough MD      Oxygen   Maintain Oxygen Saturation 88% or higher      Treadmill   MPH 2.2    Grade 1    Minutes 15    METs 2.99      REL-XR   Level 3    Speed 50    Minutes 15    METs 3      Prescription Details   Frequency (times per week) 2    Duration Progress to 30 minutes of continuous aerobic without signs/symptoms of physical distress      Intensity   THRR 40-80% of Max Heartrate 99-131    Ratings of Perceived Exertion 11-13    Perceived Dyspnea 0-4      Progression   Progression Continue to progress workloads to maintain intensity without signs/symptoms of physical distress.      Resistance Training   Training Prescription Yes    Weight 4 lb    Reps 10-15          Discharge Exercise Prescription (Final Exercise Prescription Changes):  Exercise Prescription Changes - 01/26/24 1500       Response to Exercise   Blood Pressure (Admit) 110/60    Blood Pressure (Exit) 108/60    Heart Rate (Admit) 83 bpm    Heart Rate (Exercise) 104 bpm    Heart Rate (Exit) 91 bpm    Rating of Perceived Exertion (Exercise) 15    Duration Continue with 30 min of aerobic exercise without  signs/symptoms of physical distress.    Intensity THRR unchanged      Progression   Progression Continue to progress workloads to maintain intensity without signs/symptoms of physical distress.      Resistance Training   Training Prescription Yes    Weight 5    Reps 10-15      Treadmill   MPH 3.3    Grade 3    Minutes 15    METs 4.89      REL-XR   Level 4    Speed 54    Minutes 15    METs 4.7      Home Exercise Plan   Plans to continue exercise at Home (comment)    Frequency Add 3 additional days to program exercise sessions.          Functional Capacity:  6 Minute Walk     Row Name 11/03/23 1257 12/08/23 1040 01/28/24 1043     6 Minute Walk   Phase Initial Discharge Discharge   Distance 1230 feet -- 1630 feet   Distance Feet Change -- -- 400 ft   Walk Time 6 minutes -- 6 minutes   # of Rest Breaks 0 -- 0  MPH 2.33 -- 3.08   METS 2.67 -- 3.26   RPE 11 -- 13   Perceived Dyspnea  -- -- 0   VO2 Peak 9.36 -- 11.4   Symptoms No -- No   Resting HR 67 bpm -- 60 bpm   Resting BP 144/62 -- 140/80   Resting Oxygen Saturation  96 % -- 96 %   Exercise Oxygen Saturation  during 6 min walk 96 % -- 96 %   Max Ex. HR 96 bpm -- 106 bpm   Max Ex. BP 154/74 -- 140/70   2 Minute Post BP 132/70 -- --      Psychological, QOL, Others - Outcomes: PHQ 2/9:    02/02/2024   11:01 AM 11/03/2023   10:24 AM 08/17/2023    9:20 AM 04/21/2023    9:11 AM 01/28/2023    2:55 PM  Depression screen PHQ 2/9  Decreased Interest 0 0 0 0 0  Down, Depressed, Hopeless 0 0 0 0 0  PHQ - 2 Score 0 0 0 0 0  Altered sleeping 0 0 3 0   Tired, decreased energy 0 0 1 0   Change in appetite 0 0 2 0   Feeling bad or failure about yourself  0 0 0 0   Trouble concentrating 0 0 0 0   Moving slowly or fidgety/restless 0 0 0 0   Suicidal thoughts 0 0 0 0   PHQ-9 Score 0 0  6  0    Difficult doing work/chores  Somewhat difficult Somewhat difficult Not difficult at all      Data saved with a previous  flowsheet row definition    Quality of Life:  Quality of Life - 02/02/24 1100       Quality of Life   Select Quality of Life      Quality of Life Scores   Health/Function Pre 18.8 %    Health/Function Post 27.2 %    Health/Function % Change 44.68 %    Socioeconomic Pre 29.25 %    Socioeconomic Post 30 %    Socioeconomic % Change  2.56 %    Psych/Spiritual Pre 30 %    Psych/Spiritual Post 29.14 %    Psych/Spiritual % Change -2.87 %    Family Pre 19.2 %    Family Post 34 %    Family % Change 77.08 %    GLOBAL Pre 23.49 %    GLOBAL Post 27.71 %    GLOBAL % Change 17.97 %            Nutrition & Weight - Outcomes:  Pre Biometrics - 11/03/23 1316       Pre Biometrics   Height 5' 7 (1.702 m)    Weight 82.2 kg    Waist Circumference 38 inches    Hip Circumference 39 inches    Waist to Hip Ratio 0.97 %    BMI (Calculated) 28.39    Grip Strength 18.6 kg          Post Biometrics - 01/28/24 1044        Post  Biometrics   Height 5' 7 (1.702 m)    Weight 88.7 kg    Waist Circumference 40 inches    Hip Circumference 39 inches    Waist to Hip Ratio 1.03 %    BMI (Calculated) 30.62    Grip Strength 23.5 kg    Single Leg Stand 30 seconds            [  1]  Social History Tobacco Use  Smoking Status Former   Current packs/day: 0.00   Average packs/day: 0.5 packs/day for 45.0 years (22.5 ttl pk-yrs)   Types: Cigarettes   Start date: 01/12/1983   Quit date: 11/17/2016   Years since quitting: 7.2  Smokeless Tobacco Never

## 2024-02-23 ENCOUNTER — Encounter (HOSPITAL_COMMUNITY)

## 2024-02-25 ENCOUNTER — Encounter (HOSPITAL_COMMUNITY)

## 2024-03-01 ENCOUNTER — Encounter (HOSPITAL_COMMUNITY)

## 2024-03-03 ENCOUNTER — Encounter (HOSPITAL_COMMUNITY)

## 2024-03-25 ENCOUNTER — Other Ambulatory Visit (HOSPITAL_COMMUNITY): Payer: Self-pay | Admitting: Otolaryngology

## 2024-03-25 DIAGNOSIS — C099 Malignant neoplasm of tonsil, unspecified: Secondary | ICD-10-CM

## 2024-04-01 ENCOUNTER — Ambulatory Visit (HOSPITAL_COMMUNITY): Admission: RE | Admit: 2024-04-01

## 2024-04-01 DIAGNOSIS — C099 Malignant neoplasm of tonsil, unspecified: Secondary | ICD-10-CM

## 2024-04-01 MED ORDER — IOHEXOL 300 MG/ML  SOLN
75.0000 mL | Freq: Once | INTRAMUSCULAR | Status: AC | PRN
  Administered 2024-04-01: 75 mL via INTRAVENOUS
